# Patient Record
Sex: Male | Born: 1945
Health system: Southern US, Community
[De-identification: ages and names within clinical notes are randomized; demographics above are authoritative.]

## PROBLEM LIST (undated history)

## (undated) DIAGNOSIS — J449 Chronic obstructive pulmonary disease, unspecified: Secondary | ICD-10-CM

## (undated) DIAGNOSIS — I10 Essential (primary) hypertension: Secondary | ICD-10-CM

## (undated) DIAGNOSIS — K644 Residual hemorrhoidal skin tags: Secondary | ICD-10-CM

## (undated) DIAGNOSIS — R05 Cough: Secondary | ICD-10-CM

## (undated) DIAGNOSIS — K922 Gastrointestinal hemorrhage, unspecified: Secondary | ICD-10-CM

## (undated) DIAGNOSIS — Z8709 Personal history of other diseases of the respiratory system: Secondary | ICD-10-CM

## (undated) DIAGNOSIS — Z87898 Personal history of other specified conditions: Secondary | ICD-10-CM

## (undated) DIAGNOSIS — E119 Type 2 diabetes mellitus without complications: Secondary | ICD-10-CM

## (undated) DIAGNOSIS — Z8611 Personal history of tuberculosis: Secondary | ICD-10-CM

## (undated) DIAGNOSIS — Z87442 Personal history of urinary calculi: Secondary | ICD-10-CM

## (undated) HISTORY — DX: Personal history of other diseases of the respiratory system: Z87.09

## (undated) HISTORY — DX: Type 2 diabetes mellitus without complications: E11.9

## (undated) HISTORY — DX: Cough: R05

## (undated) HISTORY — PX: NO PAST SURGERIES: SHX2092

## (undated) HISTORY — DX: Personal history of other specified conditions: Z87.898

## (undated) HISTORY — DX: Residual hemorrhoidal skin tags: K64.4

## (undated) HISTORY — DX: Essential (primary) hypertension: I10

## (undated) HISTORY — DX: Personal history of tuberculosis: Z86.11

## (undated) HISTORY — DX: Gastrointestinal hemorrhage, unspecified: K92.2

---

## 1993-03-28 DIAGNOSIS — A159 Respiratory tuberculosis unspecified: Secondary | ICD-10-CM

## 1993-03-28 HISTORY — DX: Respiratory tuberculosis unspecified: A15.9

## 2012-08-29 ENCOUNTER — Ambulatory Visit: Payer: Self-pay | Admitting: Family Medicine

## 2012-08-29 IMAGING — CR DG CHEST 2V
1 series · 3 of 3 positions shown · non-contrast
Comparison: none

REASON FOR EXAM: cough
COMMENTS:

[Series 1: pa · 0.17mm/px · 3 of 3 slices shown]
[im 1/3]
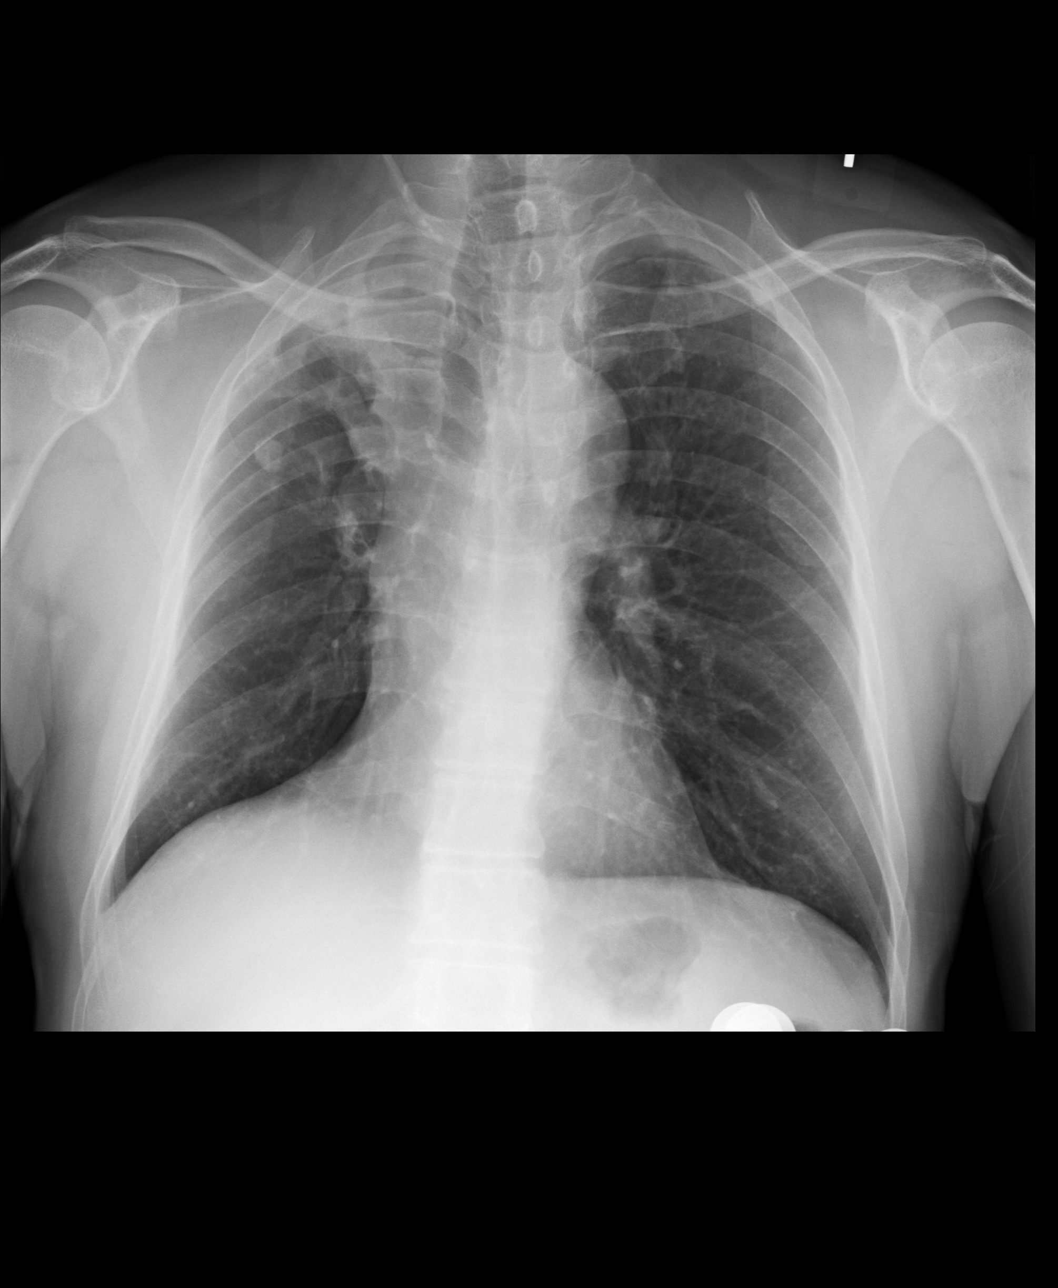
[im 2/3]
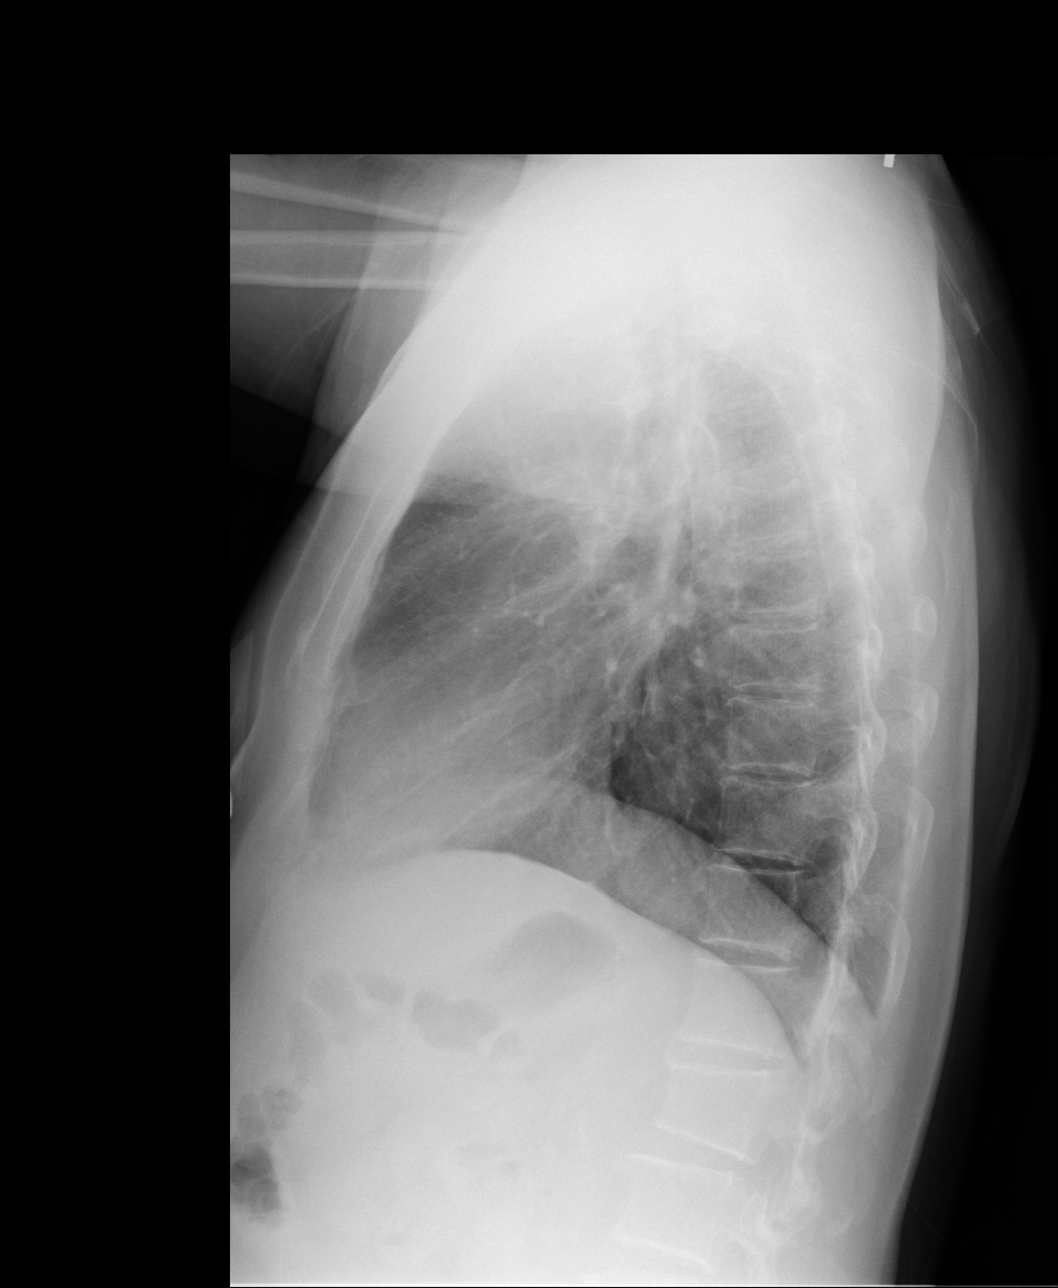
[im 3/3]
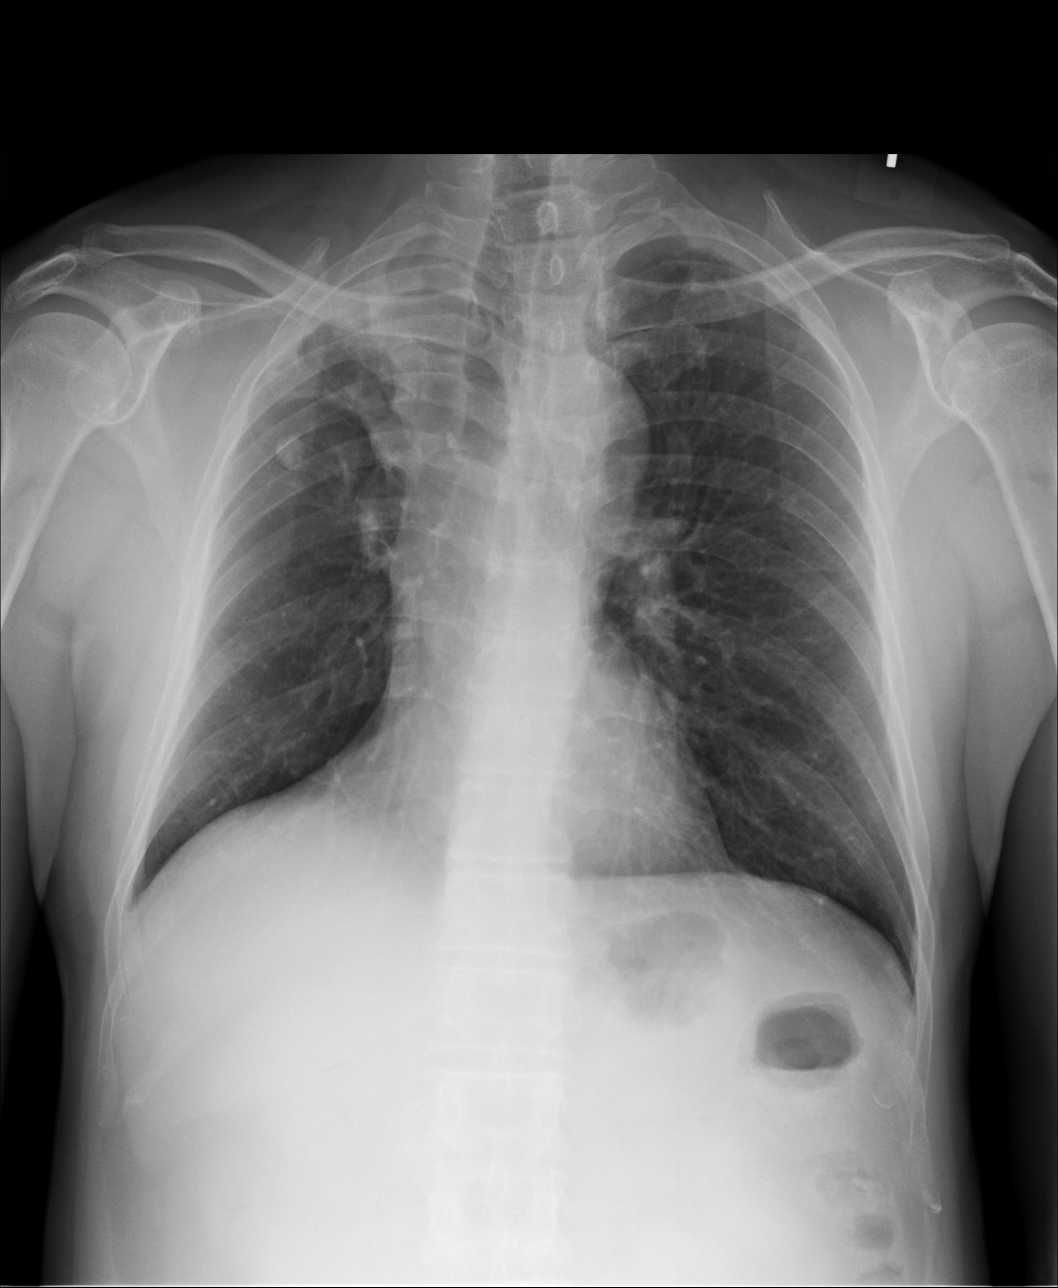

[3 of 3 positions shown; findings below may reference images not displayed]

PROCEDURE:     ROBERTO - ROBERTO CHEST PA (OR AP) AND LAT  - [DATE]  [DATE]

RESULT:     There are no previous studies for comparison.

There is abnormal soft tissue density in the right upper lobe. A nodule is
demonstrated more inferiorly likely still in the right upper lobe. The left
lung is clear. The cardiac silhouette is normal in size. There is minimal
shift of the mediastinum toward the right. There is no pleural effusion or
pneumothorax. The bony thorax exhibits no acute abnormalities.
IMPRESSION: There is abnormal soft tissue density in the right
pulmonary apex. This may be acute or chronic. If acute, malignancy or
infection would be the leading etiologies. Followup chest CT scanning and
pulmonary consultation is recommended.

[REDACTED]

## 2012-09-25 ENCOUNTER — Ambulatory Visit: Payer: Self-pay | Admitting: Family Medicine

## 2012-09-25 IMAGING — CR DG CHEST 2V
1 series · 2 of 2 positions shown · non-contrast
Comparison: none

REASON FOR EXAM: +ppd cough left upper chest & shoulder pain Please
Compare to cxr [DATE]
COMMENTS:

[Series 1: w chest pa · 0.14mm/px · 2 of 2 slices shown]
[im 1/2]
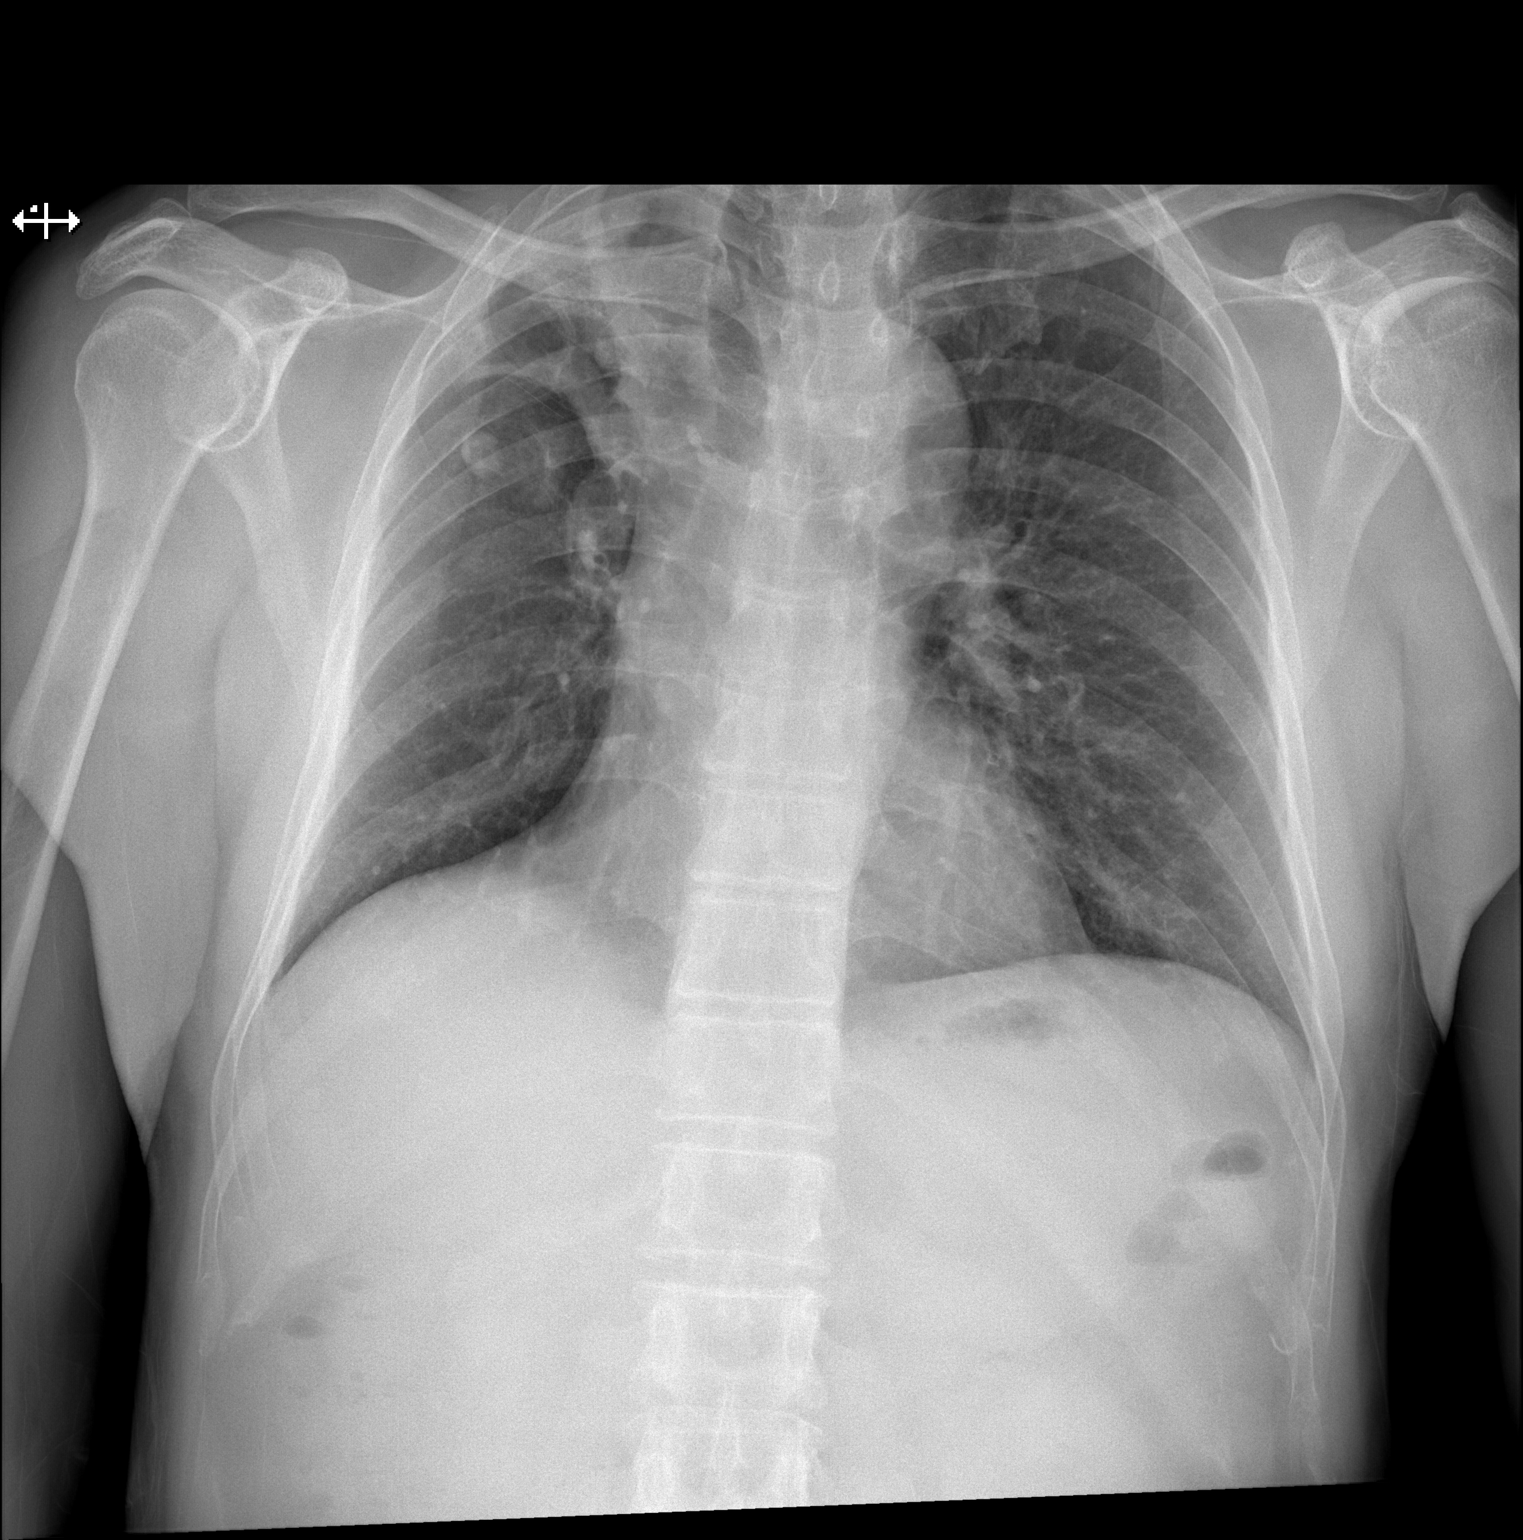
[im 2/2]
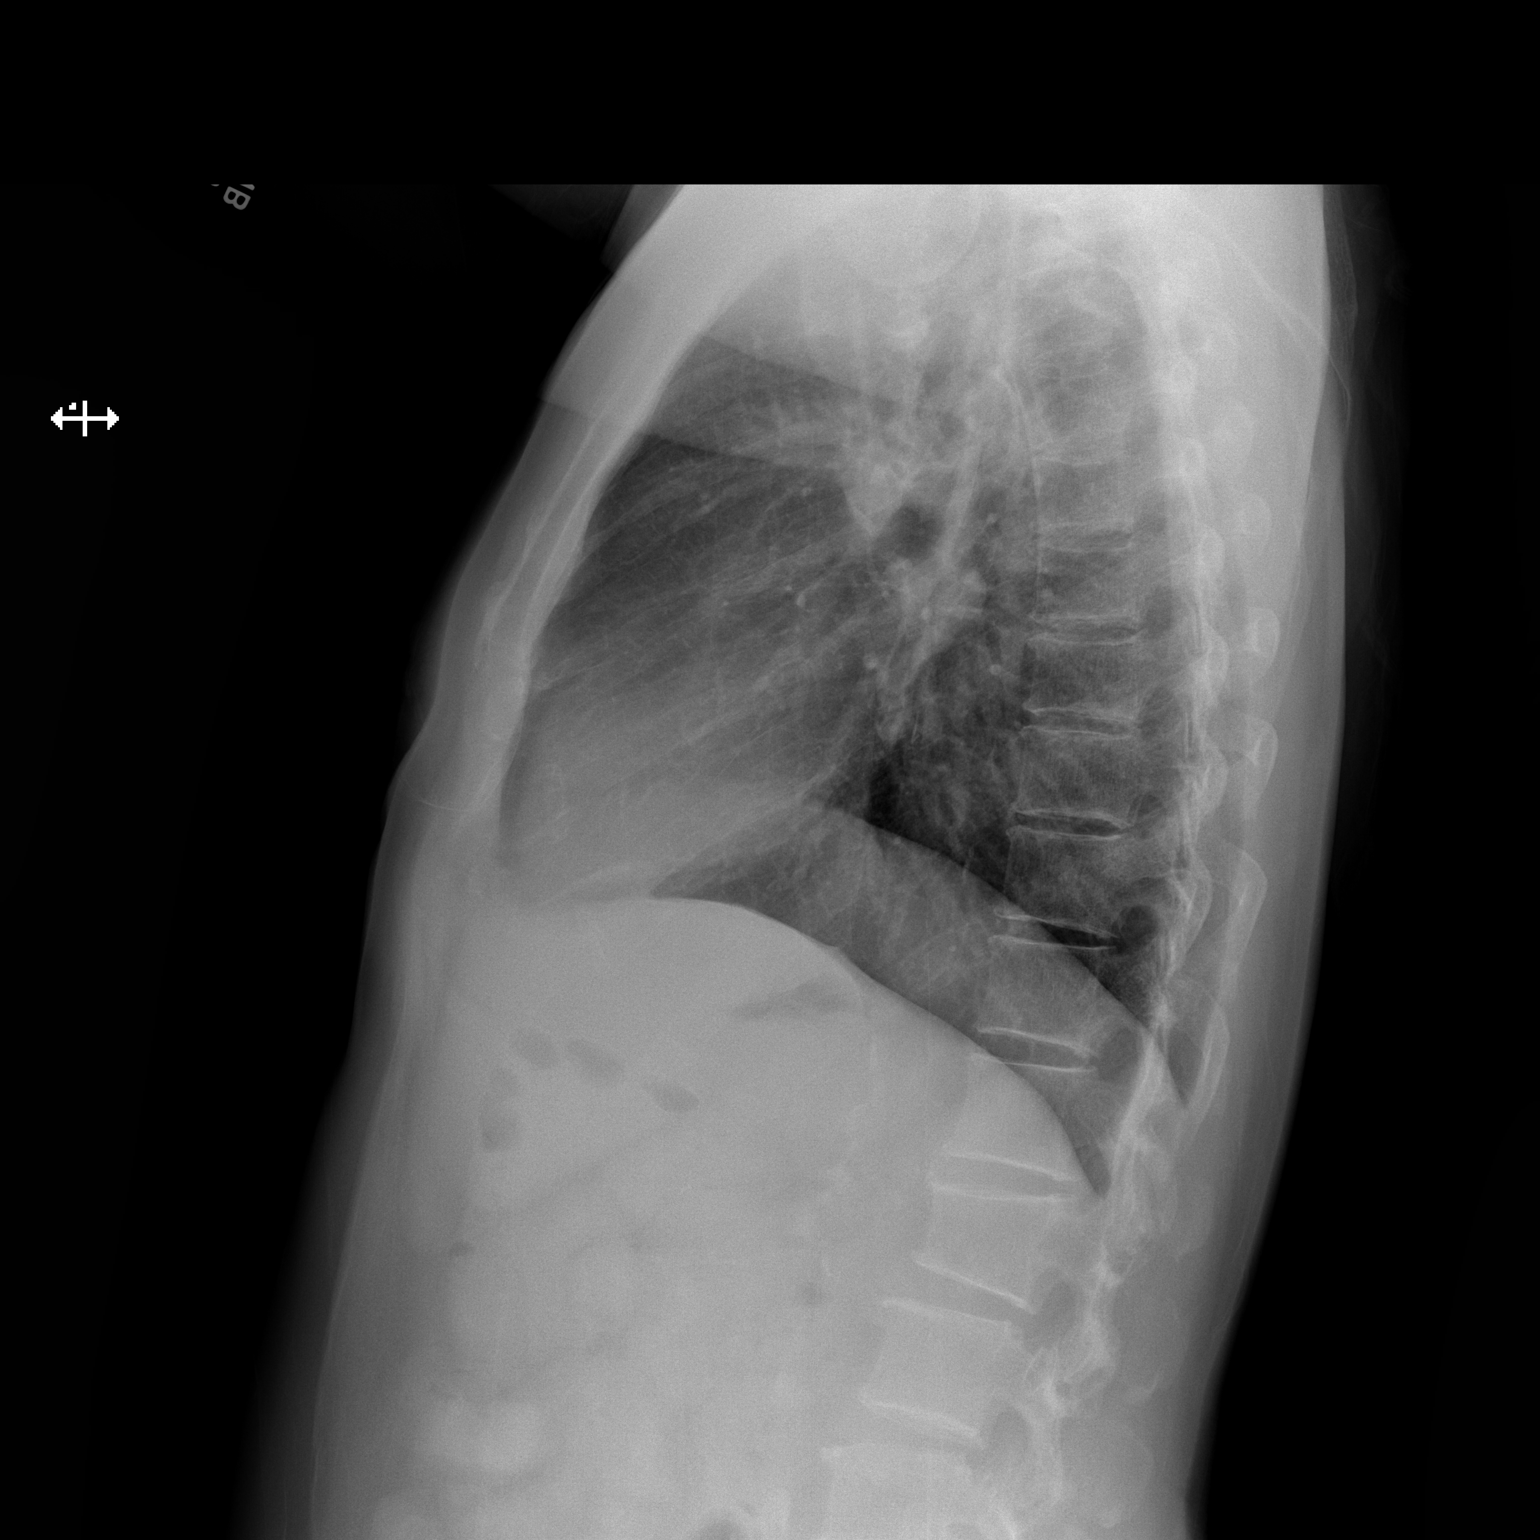

[2 of 2 positions shown; findings below may reference images not displayed]

PROCEDURE:     DXR - DXR CHEST PA (OR AP) AND LATERAL  - [DATE] [DATE]

RESULT:     Comparison is made to the study [DATE].

There is stable apical pleural thickening. On the right there is persistent
abnormal soft tissue density in the upper lobe. A discrete nodule measuring
approximately 10 x 12 mm is visible and stable. More superiorly density
radiates to the pleural surface and apex. There is no significant pleural
fluid collection.

The left lung is well-expanded and exhibits a slight increase in the
interstitial markings especially in the infrahilar region.

The cardiac silhouette is normal in size. The pulmonary vascularity is not
engorged.
IMPRESSION: 1. There is persistent abnormal density in the right upper hemithorax. This
has not significantly changed since the previous study.
2. The interstitial markings on the left are slightly more prominent today
especially in the infrahilar region lateral to the cardiac apex. This may
reflect subsegmental atelectasis.
3. There is no evidence of CHF nor alveolar infiltrate outside of the right
upper lobe.

[REDACTED]

## 2014-02-01 ENCOUNTER — Inpatient Hospital Stay: Payer: Self-pay | Admitting: Internal Medicine

## 2014-02-01 LAB — CBC WITH DIFFERENTIAL/PLATELET
Basophil #: 0 10*3/uL (ref 0.0–0.1)
Basophil %: 0.4 %
Eosinophil #: 0 10*3/uL (ref 0.0–0.7)
Eosinophil %: 0.1 %
HCT: 44.4 % (ref 40.0–52.0)
HGB: 15 g/dL (ref 13.0–18.0)
Lymphocyte #: 2.2 10*3/uL (ref 1.0–3.6)
Lymphocyte %: 19.9 %
MCH: 31.7 pg (ref 26.0–34.0)
MCHC: 33.8 g/dL (ref 32.0–36.0)
MCV: 94 fL (ref 80–100)
Monocyte #: 1.2 x10 3/mm — ABNORMAL HIGH (ref 0.2–1.0)
Monocyte %: 10.6 %
NEUTROS PCT: 69 %
Neutrophil #: 7.6 10*3/uL — ABNORMAL HIGH (ref 1.4–6.5)
PLATELETS: 247 10*3/uL (ref 150–440)
RBC: 4.73 10*6/uL (ref 4.40–5.90)
RDW: 12.9 % (ref 11.5–14.5)
WBC: 11 10*3/uL — ABNORMAL HIGH (ref 3.8–10.6)

## 2014-02-01 LAB — URINALYSIS, COMPLETE
BILIRUBIN, UR: NEGATIVE
Bacteria: NONE SEEN
Glucose,UR: 50 mg/dL (ref 0–75)
LEUKOCYTE ESTERASE: NEGATIVE
Nitrite: NEGATIVE
PH: 5 (ref 4.5–8.0)
RBC,UR: 1 /HPF (ref 0–5)
Specific Gravity: 1.023 (ref 1.003–1.030)
Squamous Epithelial: 1
WBC UR: 1 /HPF (ref 0–5)

## 2014-02-01 LAB — TROPONIN I

## 2014-02-01 LAB — BASIC METABOLIC PANEL
Anion Gap: 11 (ref 7–16)
BUN: 14 mg/dL (ref 7–18)
Calcium, Total: 9.4 mg/dL (ref 8.5–10.1)
Chloride: 100 mmol/L (ref 98–107)
Co2: 25 mmol/L (ref 21–32)
Creatinine: 0.92 mg/dL (ref 0.60–1.30)
EGFR (African American): >60
Glucose: 151 mg/dL — ABNORMAL HIGH (ref 65–99)
Osmolality: 275 (ref 275–301)
Potassium: 3.1 mmol/L — ABNORMAL LOW (ref 3.5–5.1)
Sodium: 136 mmol/L (ref 136–145)

## 2014-02-01 IMAGING — CR DG CHEST 1V PORT
1 series · 2 of 2 positions shown · non-contrast
Comparison: Radiograph [DATE]

CLINICAL DATA: Patient feels sick for 2 weeks. Nonproductive cough.

EXAM:
PORTABLE CHEST - 1 VIEW

[Series 1: ap · 0.17mm/px · 2 of 2 slices shown]
[im 1/2]
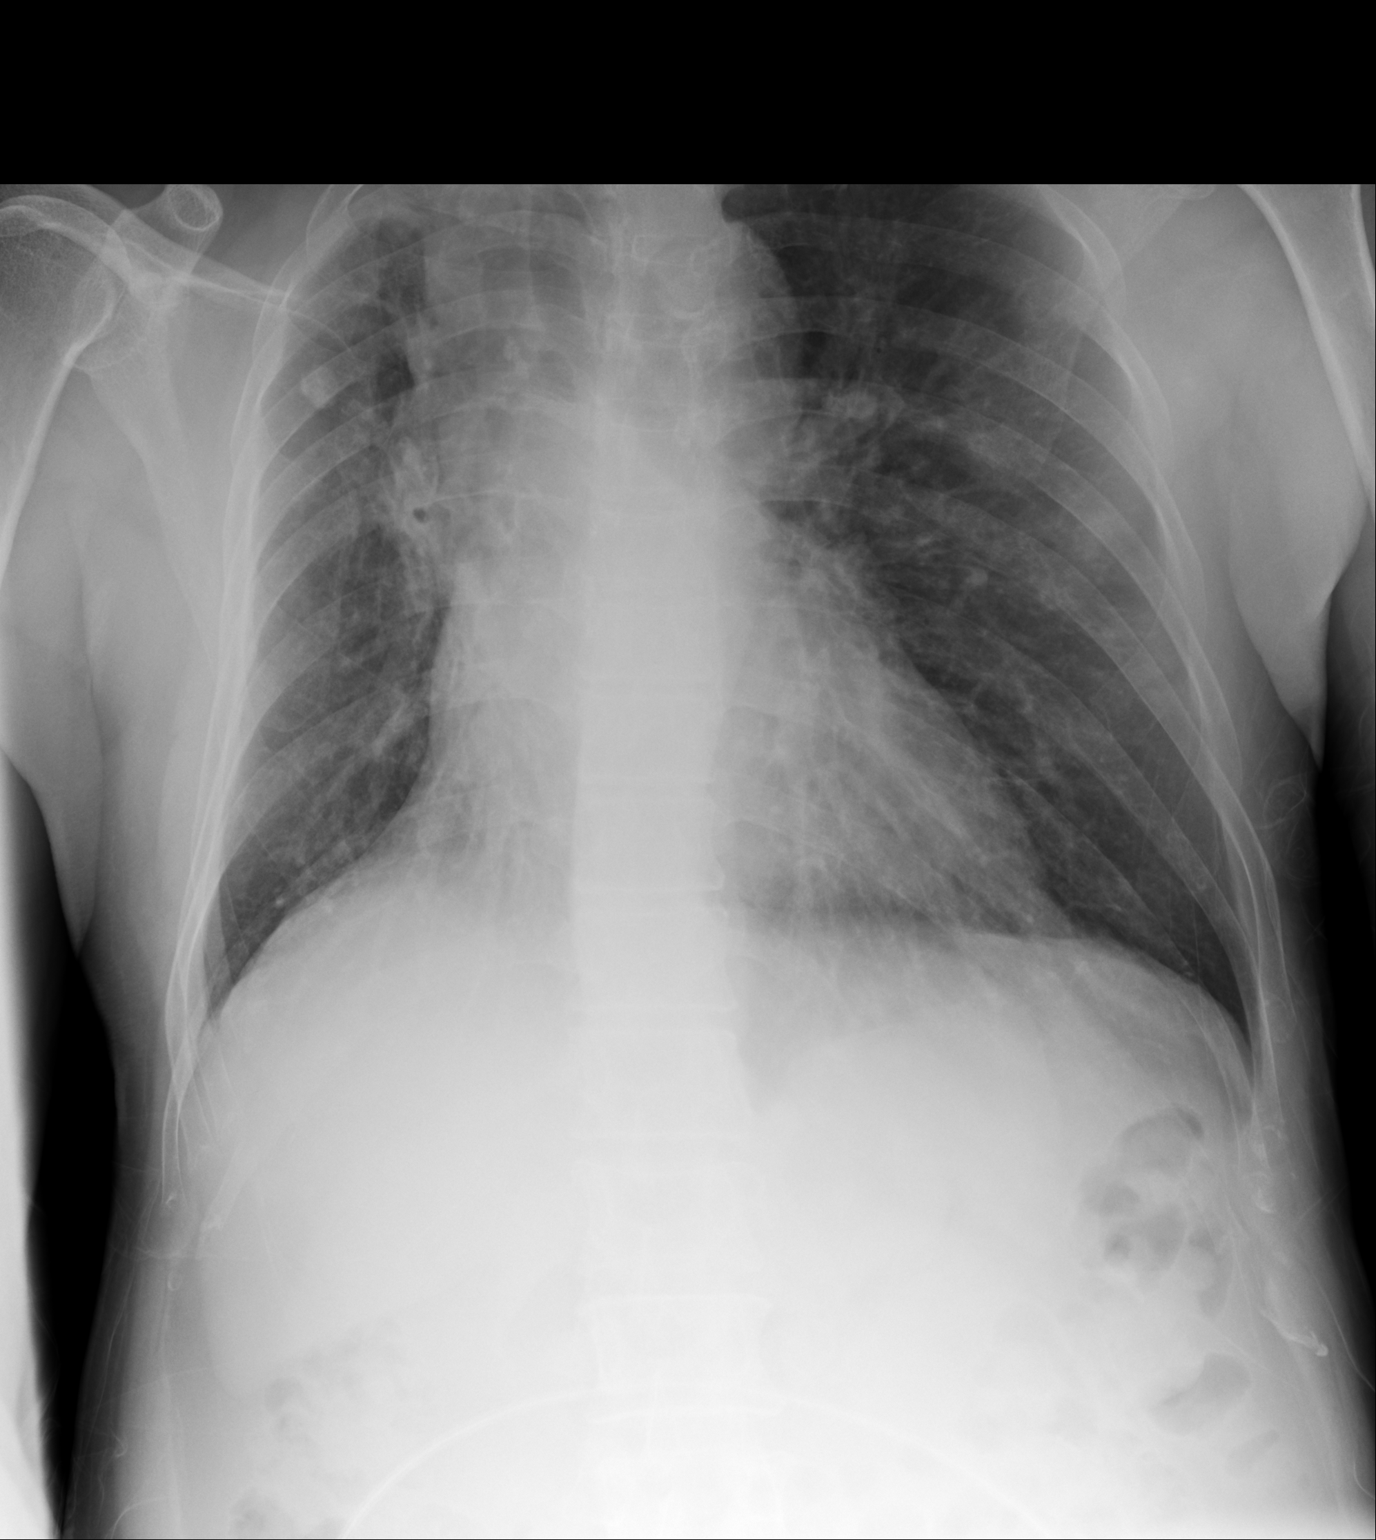
[im 2/2]
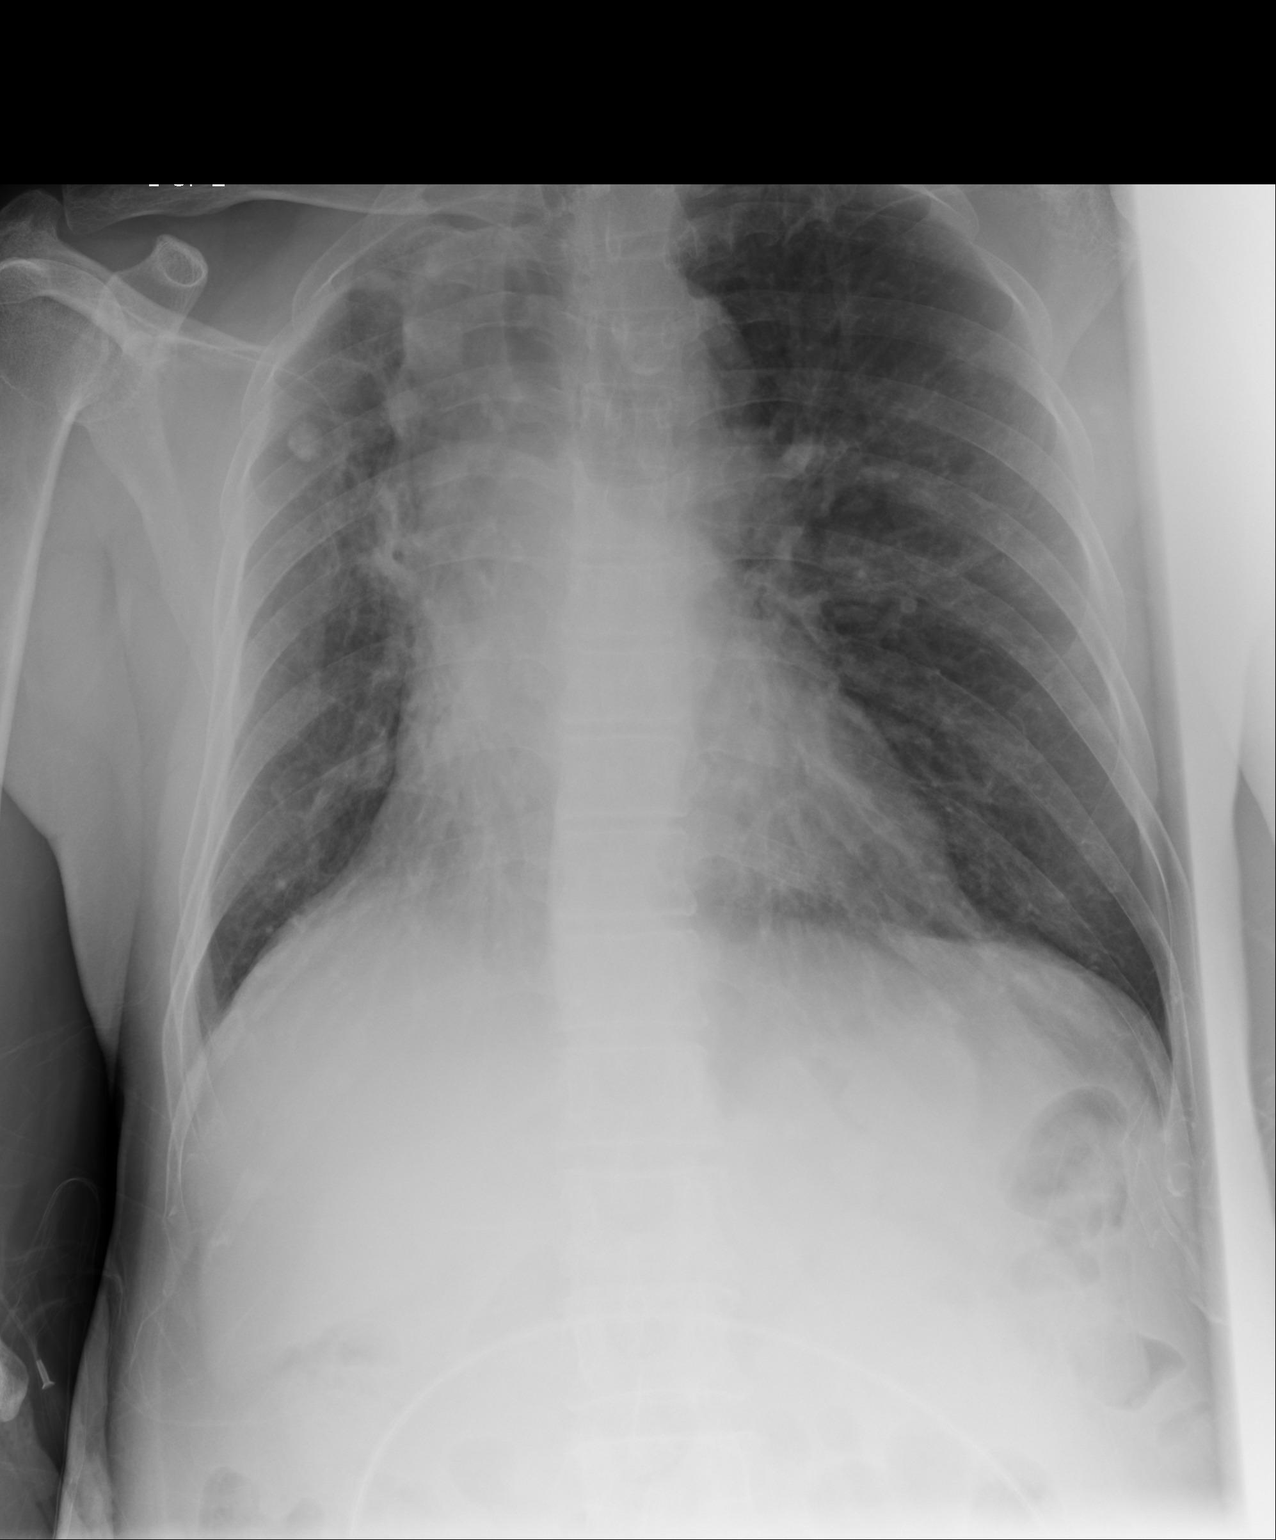

[2 of 2 positions shown; findings below may reference images not displayed]

FINDINGS: Stable cardiac silhouette. There is volume loss in the right hemi
thorax compared to prior. There is right apical opacity increased in
density compared to prior. Stable nodule in the right upper lobe
measures 9 mm. 10 mm nodule in the left lung is not seen on prior
exam.
IMPRESSION: 1. Increased volume loss in right hemi thorax with apical density.
2. New nodule in the left lung. Recommend CT thorax with contrast
for further evaluation.

## 2014-02-01 IMAGING — CT CT CHEST W/ CM
2 of 3 series · 15 of 36 positions shown, 18 images · IV contrast (agent unspecified)
Comparison: Chest [DATE]

CLINICAL DATA: Patient fell 6 for 2 weeks with nonproductive cough,
dizziness, hot and cold flashes, chest pain, and abdominal pain.
Smoker.

EXAM:
CT CHEST WITH CONTRAST
TECHNIQUE: Multidetector CT imaging of the chest was performed during
intravenous contrast administration.
CONTRAST:  75 mL [[place]]

[Series 2: routine chest with · axial · 0.57mm/px · z∈[-360,-90]mm · 12 of 64 slices shown, 15 images]
[im 5/64  mediastinal]
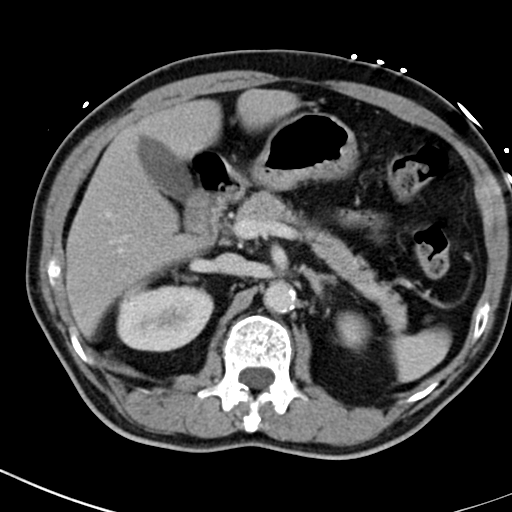
[im 5/64  lung]
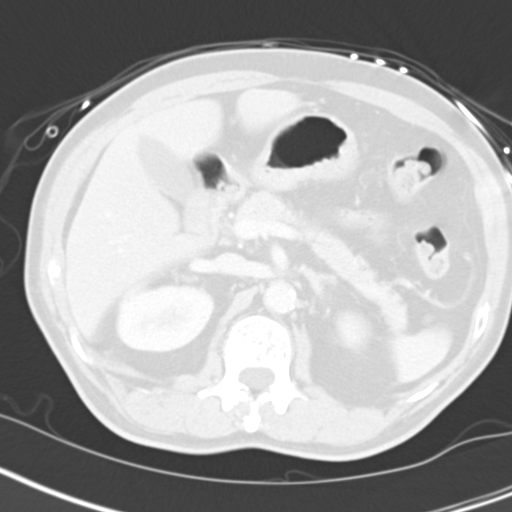
[im 10/64  lung]
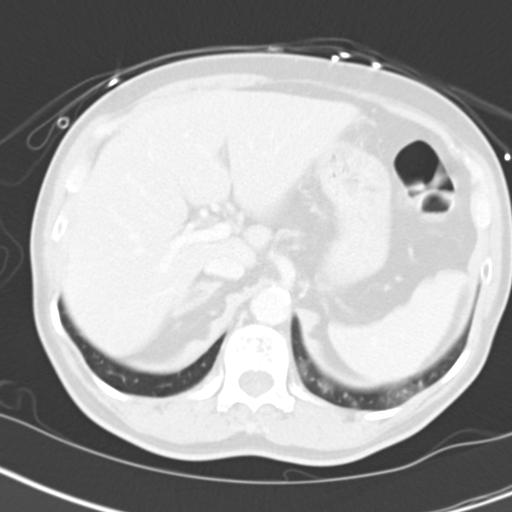
[im 15/64  lung]
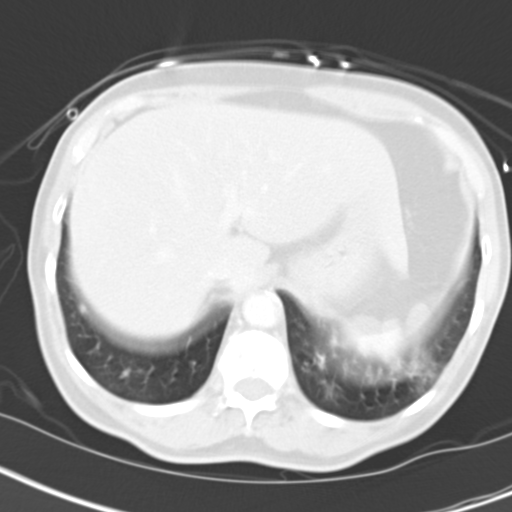
[im 19/64  lung]
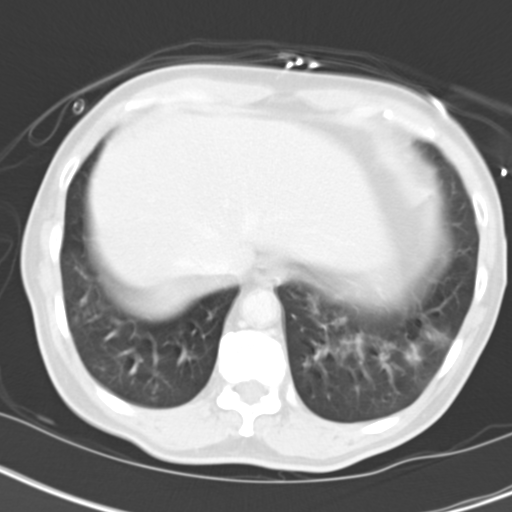
[im 24/64  mediastinal]
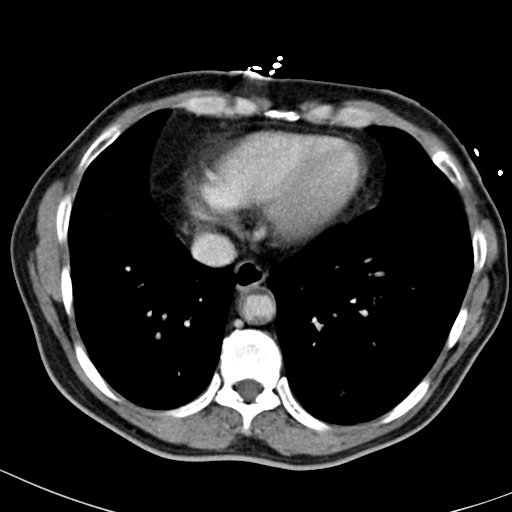
[im 24/64  lung]
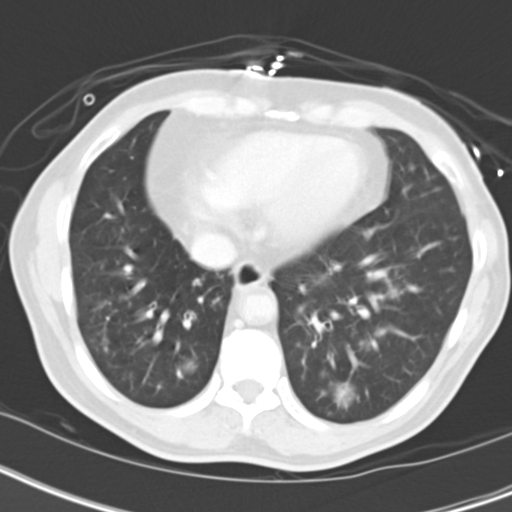
[im 29/64  lung]
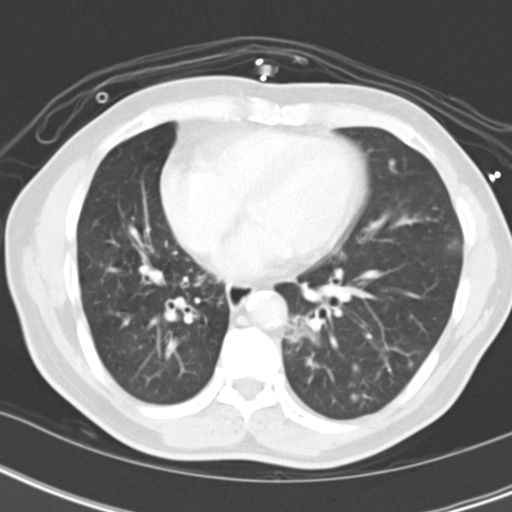
[im 36/64  lung]
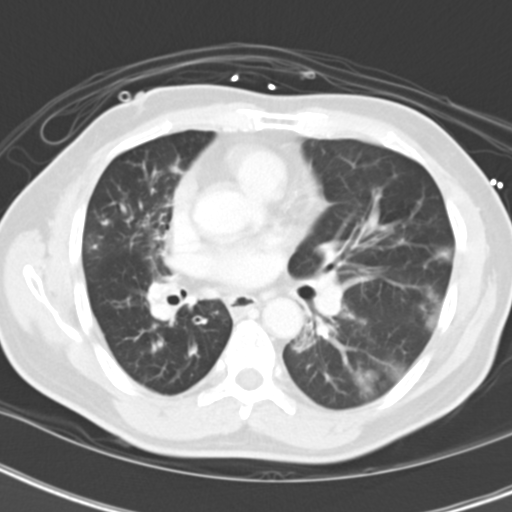
[im 40/64  lung]
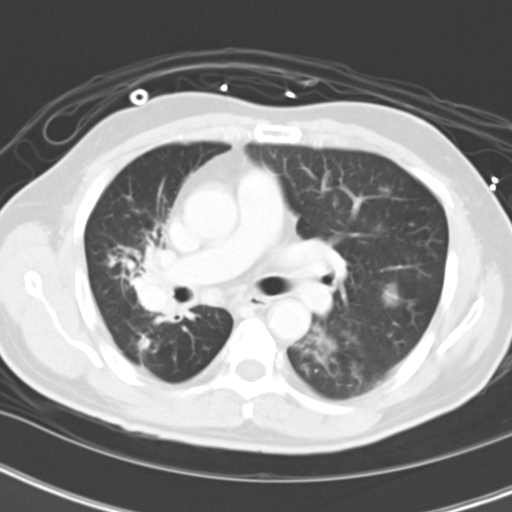
[im 45/64  mediastinal]
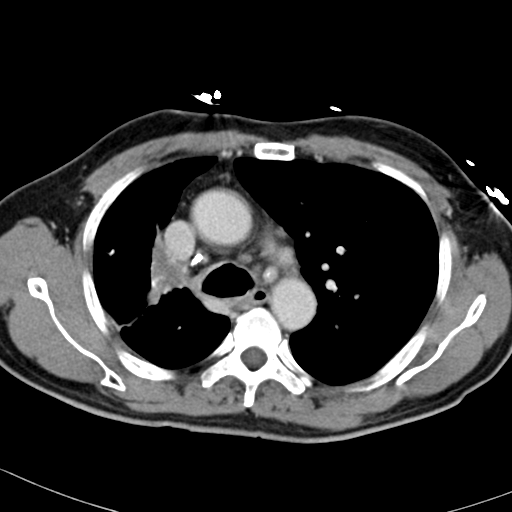
[im 45/64  lung]
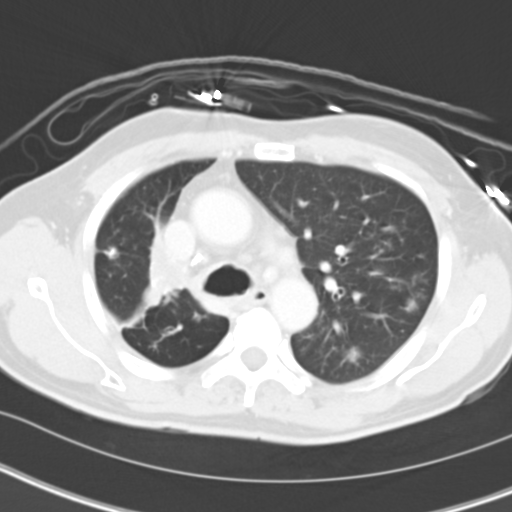
[im 50/64  lung]
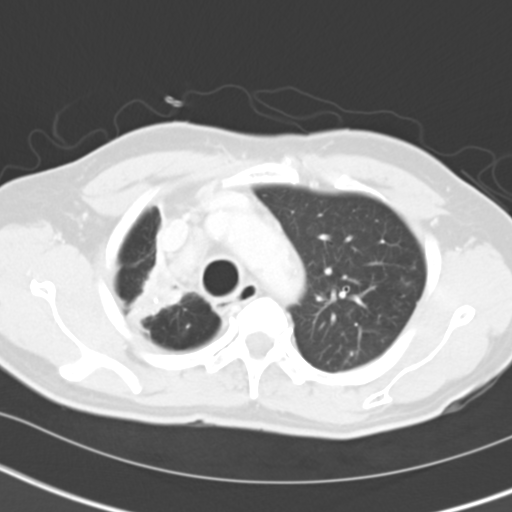
[im 54/64  lung]
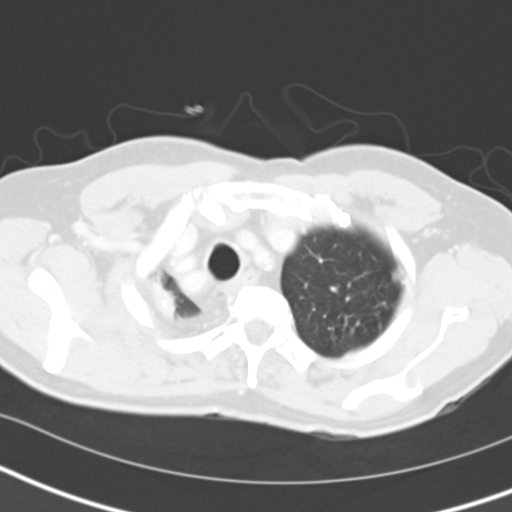
[im 59/64  lung]
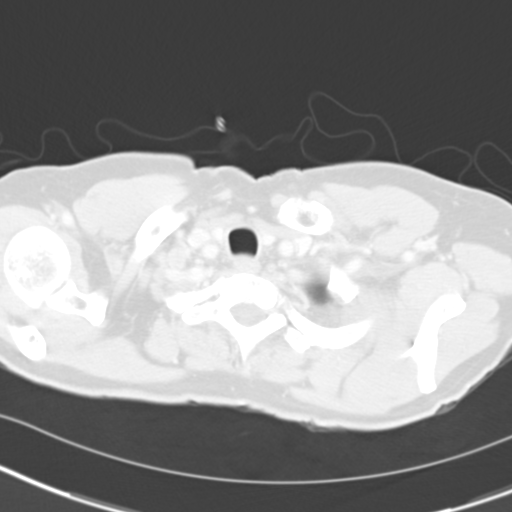

[Series 5: cor routine chest with · coronal · 0.57mm/px · 3 of 111 slices shown]
[im 23/111  lung]
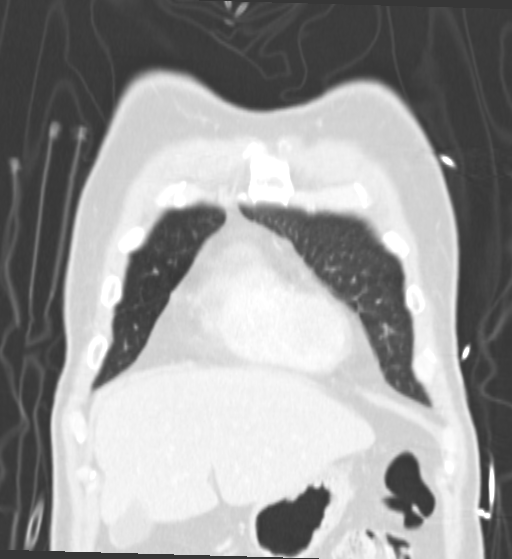
[im 45/111  lung]
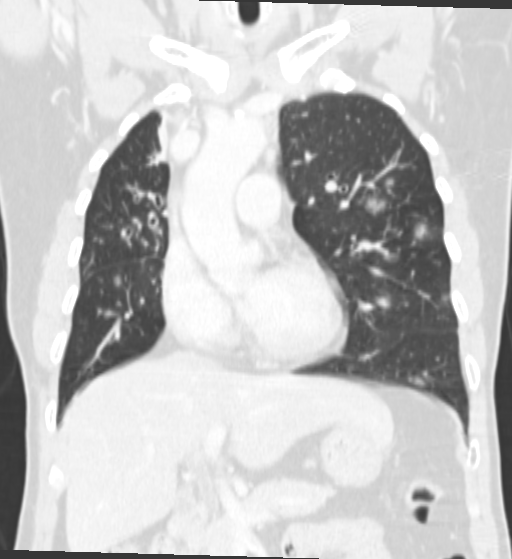
[im 67/111  lung]
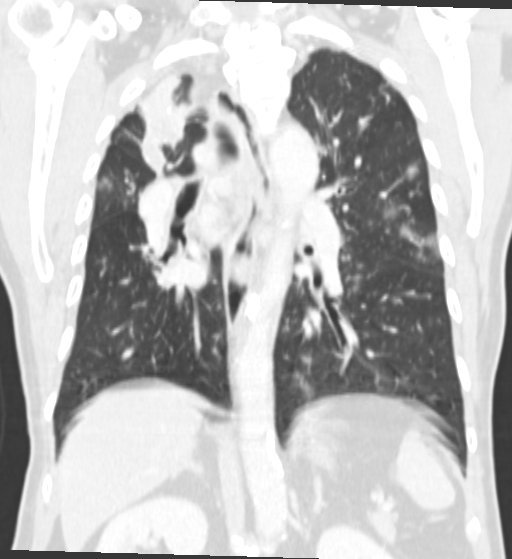

[15 of 36 positions shown; findings below may reference images not displayed]

FINDINGS: Normal heart size. Normal caliber thoracic aorta with calcification.
Scattered lymph nodes are demonstrated throughout the mediastinum
and in the right hilum which are upper limits of normal. These are
likely reactive or inflammatory. Calcification present in some lymph
nodes with calcified granuloma in the right mid lung. Calcified
granuloma also in the left apex.

Consolidation and calcification in the right upper lung with
associated volume loss and mediastinal shift towards the right.
Patchy nodular infiltrates demonstrated throughout both lungs.
Appearance suggest inflammatory process and is worrisome for
reactivation TB. Multifocal pneumonia or septic embolus could also
have this appearance. Metastatic disease or lymphoma felt less
likely. No pleural effusions. No pneumothorax.

Normal caliber thoracic aorta with calcification. Great vessel
origins are patent. Included portions of the upper abdominal organs
demonstrate diffuse fatty infiltration of the liver and vascular
calcifications in the aorta. Degenerative changes in the spine. No
destructive bone lesions appreciated.
IMPRESSION: Calcified granulomas in the lungs with calcified right hilar nodes.
Calcification and scarring in the right apex with associated volume
loss. Patchy nodular airspace disease demonstrated throughout both
lungs. Appearance is consistent with inflammatory process and
reactivation TB should be excluded.

These results were called by telephone at the time of interpretation
on [DATE] at [DATE] to Dr. ARAKEZA , who verbally
acknowledged these results.

## 2014-02-02 LAB — BILIRUBIN, TOTAL: Bilirubin,Total: 0.5 mg/dL (ref 0.2–1.0)

## 2014-02-02 LAB — BILIRUBIN, DIRECT: Bilirubin, Direct: <0.1 mg/dL (ref 0.0–0.2)

## 2014-02-02 LAB — RAPID HIV SCREEN (HIV 1/2 AB+AG)

## 2014-02-02 IMAGING — US US RENAL KIDNEY
1 series · 14 of 25 positions shown · non-contrast
Comparison: None.

CLINICAL DATA: Flank pain. History of tuberculosis. Urinary
frequency.

EXAM:
RENAL/URINARY TRACT ULTRASOUND COMPLETE

[Series 1: us renal kidney · 0.23mm/px · 14 of 25 slices shown]
[im 1/25]
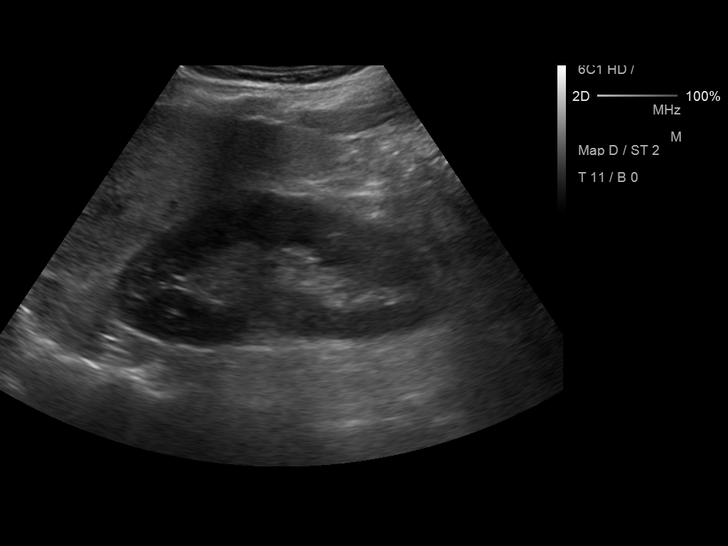
[im 3/25]
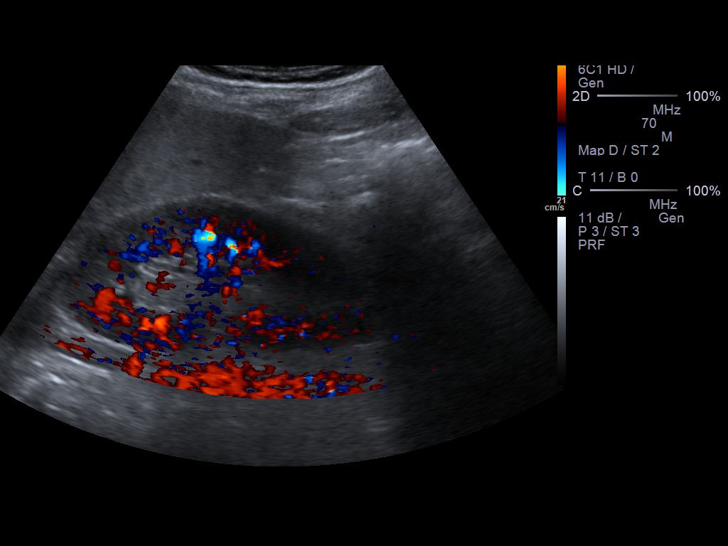
[im 5/25]
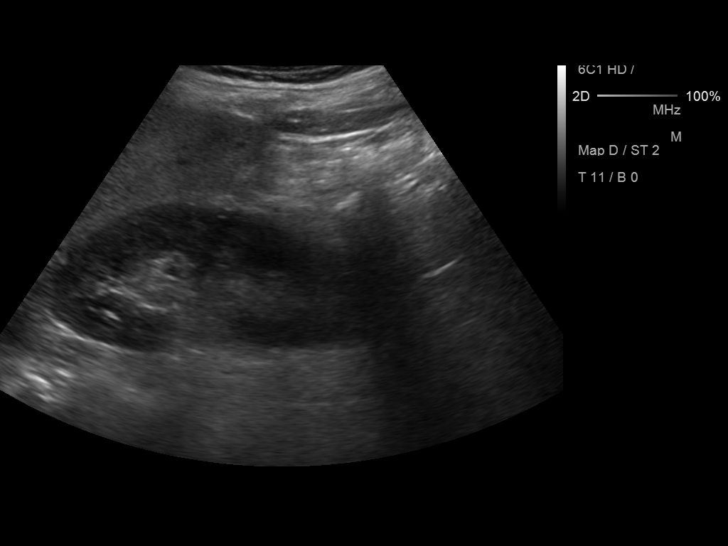
[im 7/25]
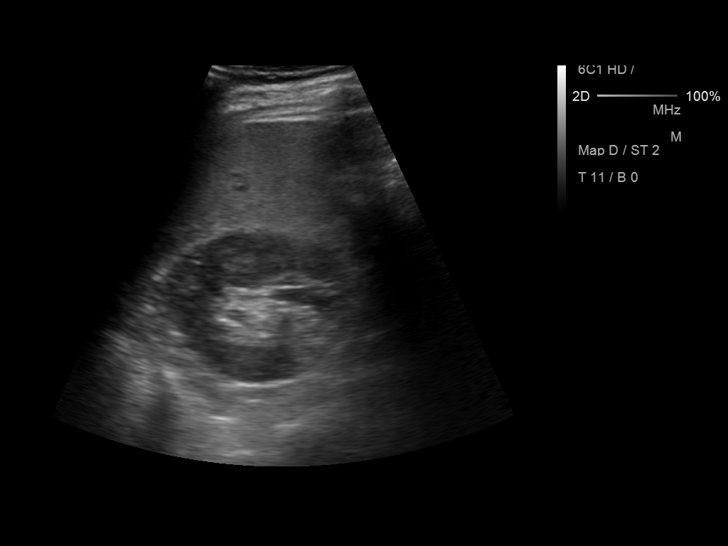
[im 9/25]
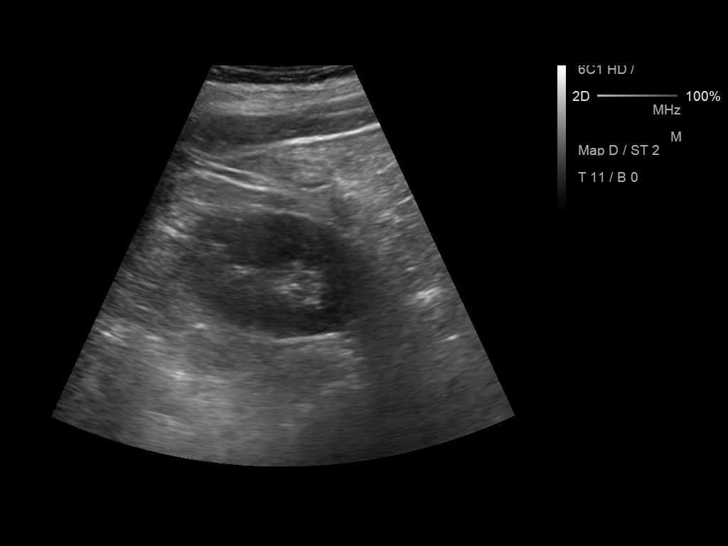
[im 10/25]
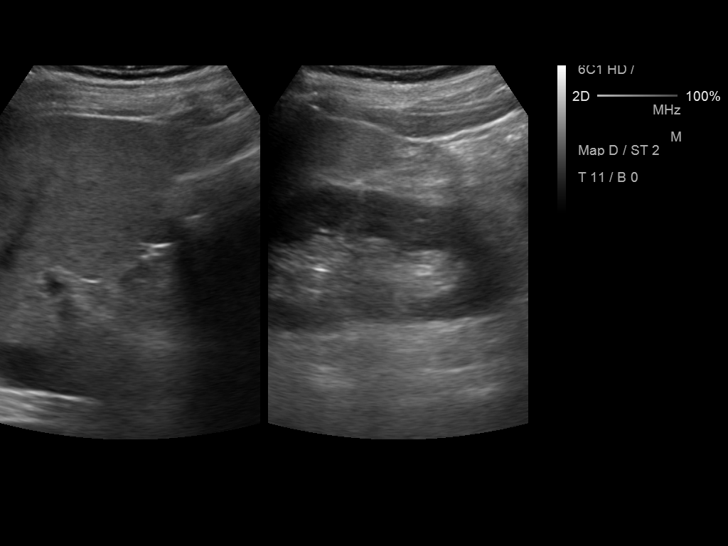
[im 12/25]
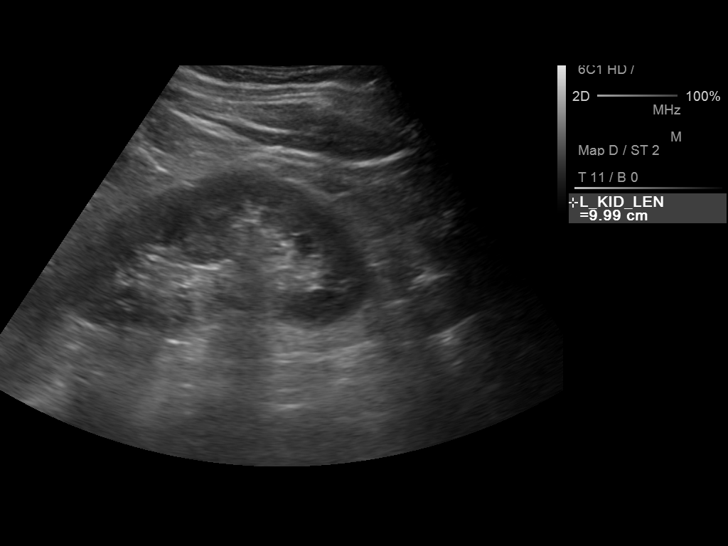
[im 14/25]
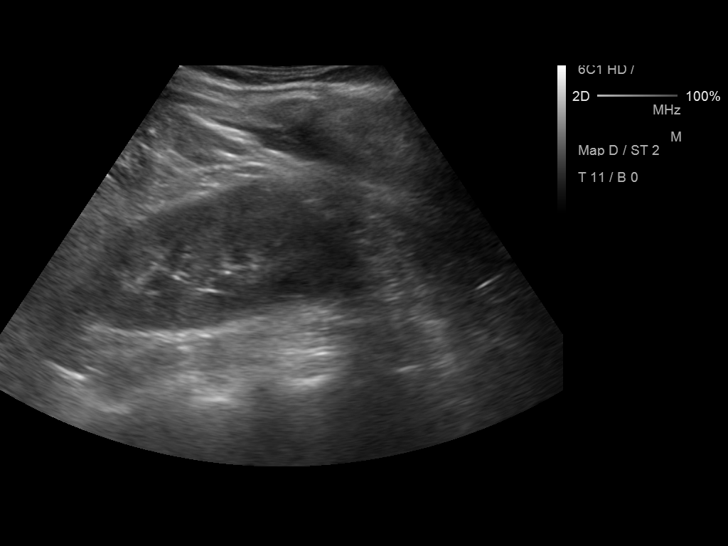
[im 16/25]
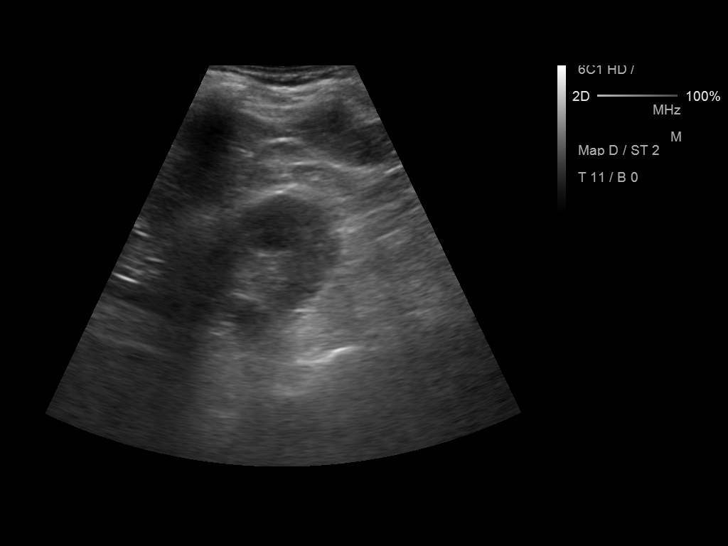
[im 17/25]
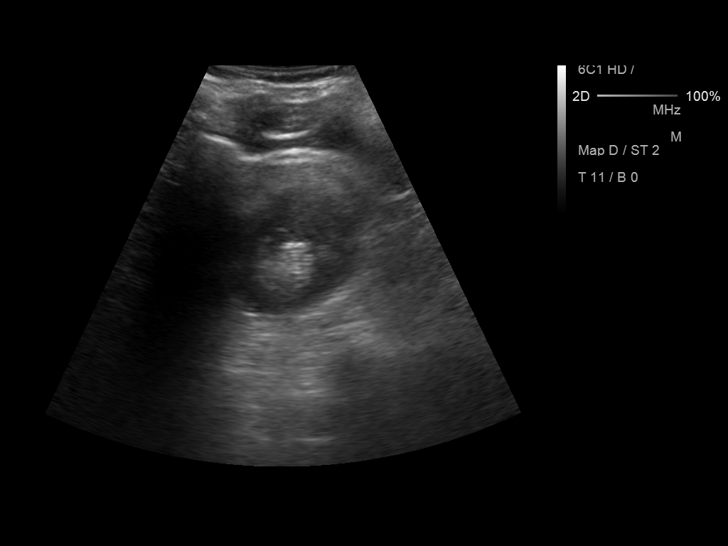
[im 19/25]
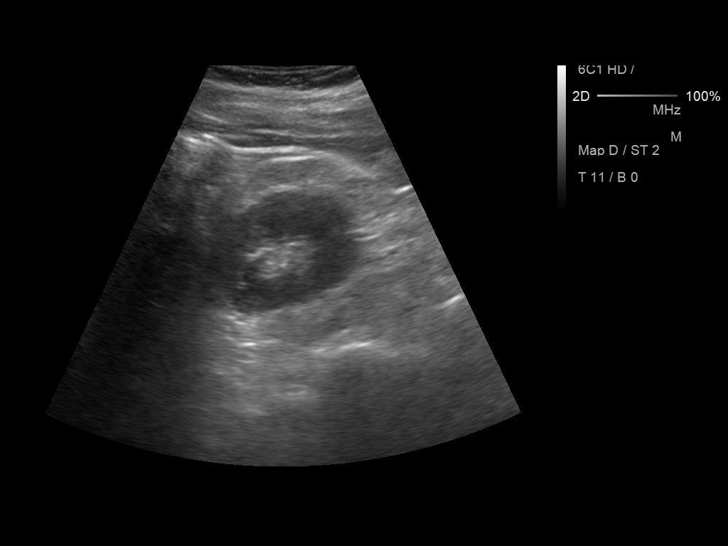
[im 21/25]
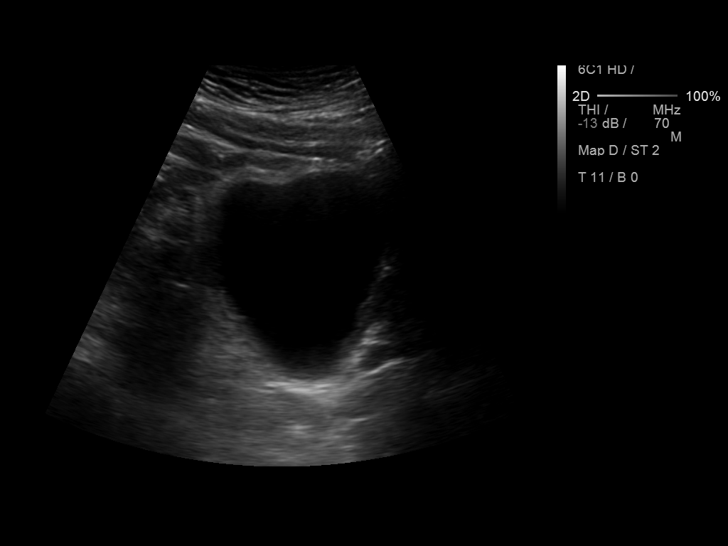
[im 23/25]
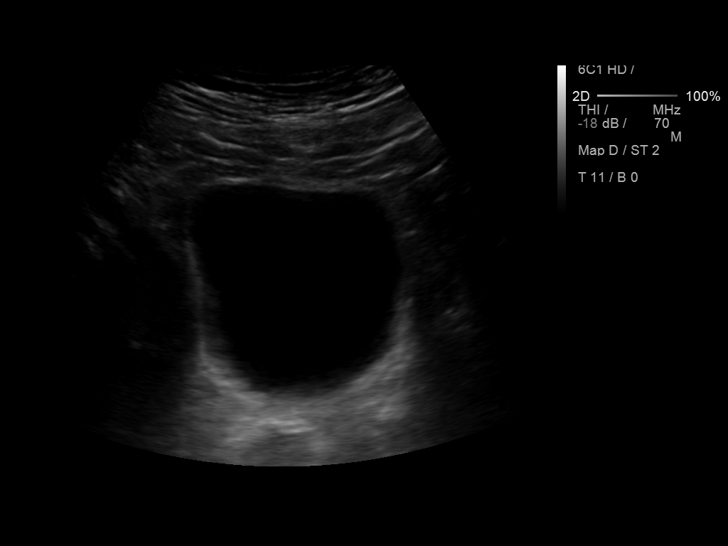
[im 25/25]
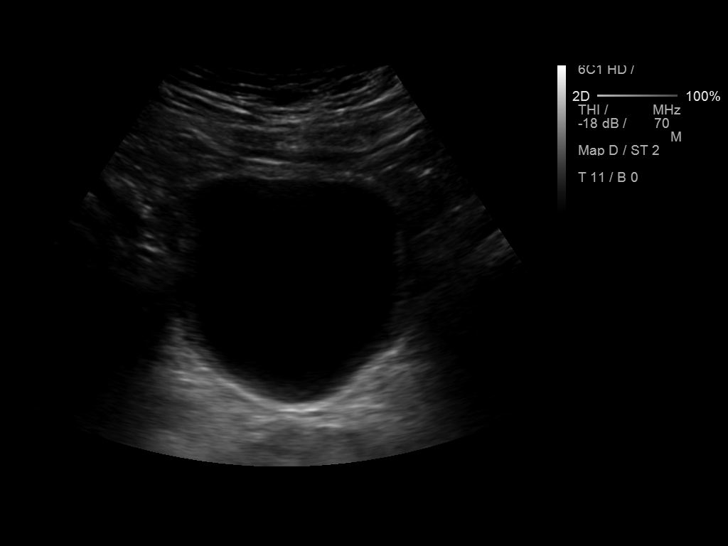

[14 of 25 positions shown; findings below may reference images not displayed]

FINDINGS: Right Kidney:

Length: 10.8 cm. Echogenicity within normal limits. No mass or
hydronephrosis visualized.

Left Kidney:

Length: 10.0 cm. Echogenicity within normal limits. No mass or
hydronephrosis visualized.

Bladder:

Appears normal for degree of bladder distention.
IMPRESSION: Normal renal ultrasound.

## 2014-02-03 LAB — PRO B NATRIURETIC PEPTIDE: B-TYPE NATIURETIC PEPTID: 1148 pg/mL — AB (ref 0–125)

## 2014-02-04 DIAGNOSIS — I509 Heart failure, unspecified: Secondary | ICD-10-CM

## 2014-02-04 LAB — CBC WITH DIFFERENTIAL/PLATELET
Basophil #: 0 10*3/uL (ref 0.0–0.1)
Basophil %: 0.4 %
EOS ABS: 0 10*3/uL (ref 0.0–0.7)
Eosinophil %: 0.1 %
HCT: 38.9 % — ABNORMAL LOW (ref 40.0–52.0)
HGB: 12.9 g/dL — AB (ref 13.0–18.0)
LYMPHS PCT: 19.9 %
Lymphocyte #: 1.7 10*3/uL (ref 1.0–3.6)
MCH: 31.5 pg (ref 26.0–34.0)
MCHC: 33.3 g/dL (ref 32.0–36.0)
MCV: 95 fL (ref 80–100)
Monocyte #: 0.4 x10 3/mm (ref 0.2–1.0)
Monocyte %: 4.8 %
NEUTROS PCT: 74.8 %
Neutrophil #: 6.4 10*3/uL (ref 1.4–6.5)
Platelet: 298 10*3/uL (ref 150–440)
RBC: 4.11 10*6/uL — ABNORMAL LOW (ref 4.40–5.90)
RDW: 12.8 % (ref 11.5–14.5)
WBC: 8.6 10*3/uL (ref 3.8–10.6)

## 2014-02-04 LAB — BASIC METABOLIC PANEL
ANION GAP: 9 (ref 7–16)
BUN: 18 mg/dL (ref 7–18)
CALCIUM: 9.1 mg/dL (ref 8.5–10.1)
Chloride: 102 mmol/L (ref 98–107)
Co2: 25 mmol/L (ref 21–32)
Creatinine: 0.87 mg/dL (ref 0.60–1.30)
Glucose: 225 mg/dL — ABNORMAL HIGH (ref 65–99)
OSMOLALITY: 281 (ref 275–301)
Potassium: 4.1 mmol/L (ref 3.5–5.1)
Sodium: 136 mmol/L (ref 136–145)

## 2014-02-04 LAB — PSA: PSA: 0.8 ng/mL (ref 0.0–4.0)

## 2014-02-04 IMAGING — CR DG CHEST 2V
1 series · 3 of 3 positions shown · non-contrast
Comparison: Chest x-ray and CT of the chest on [DATE]

CLINICAL DATA: Difficulty breathing and shortness of breath. Prior
granulomatous disease.

EXAM:
CHEST - 2 VIEW

[Series 1: x chest ap · 0.14mm/px · 3 of 3 slices shown]
[im 1/3]
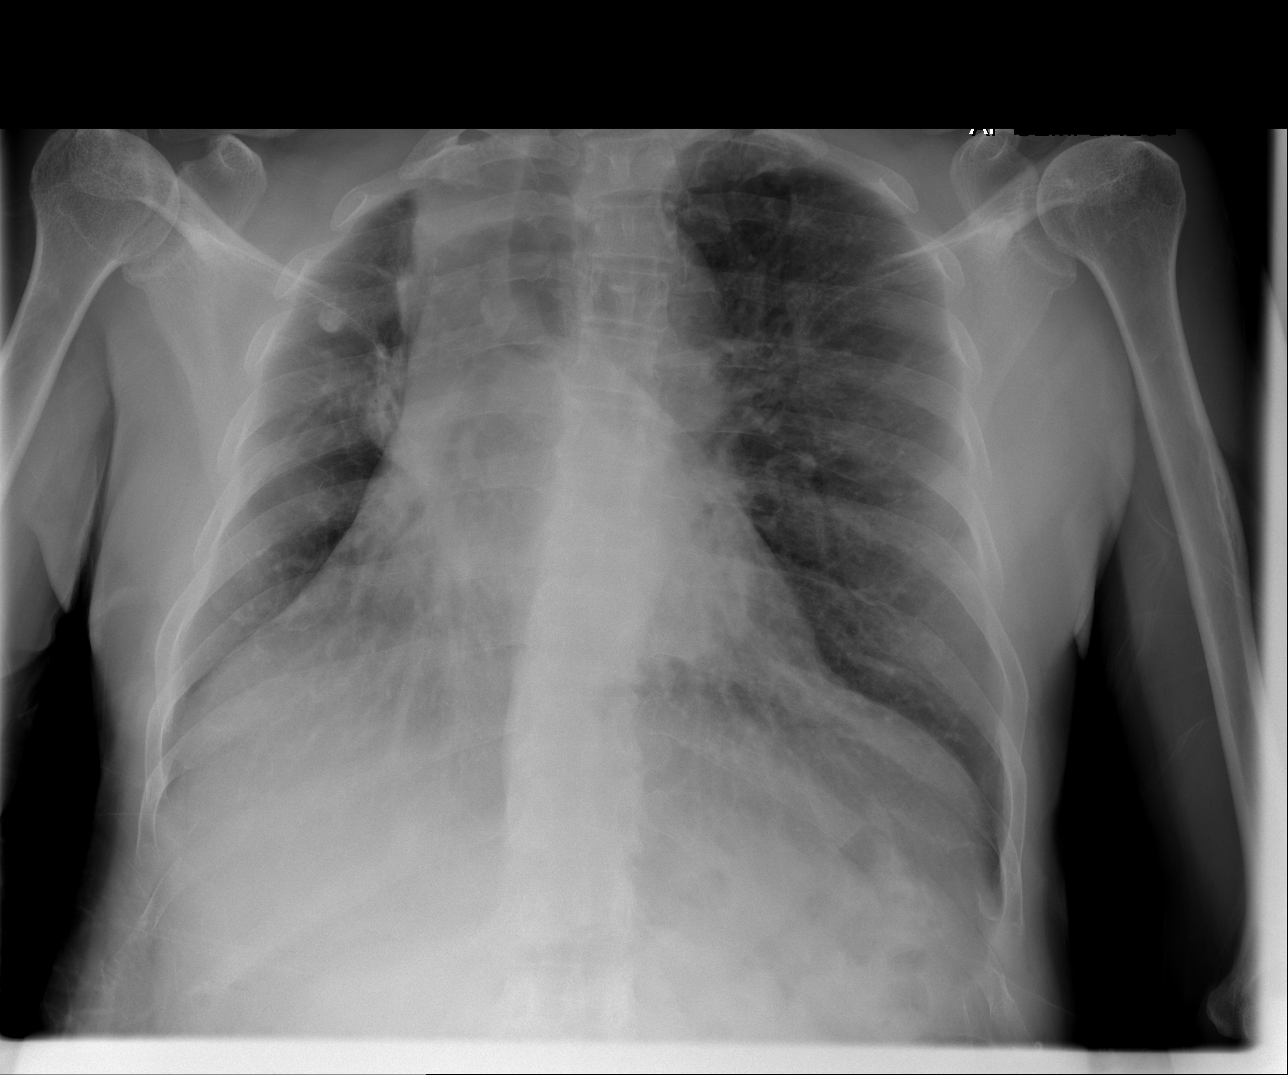
[im 2/3]
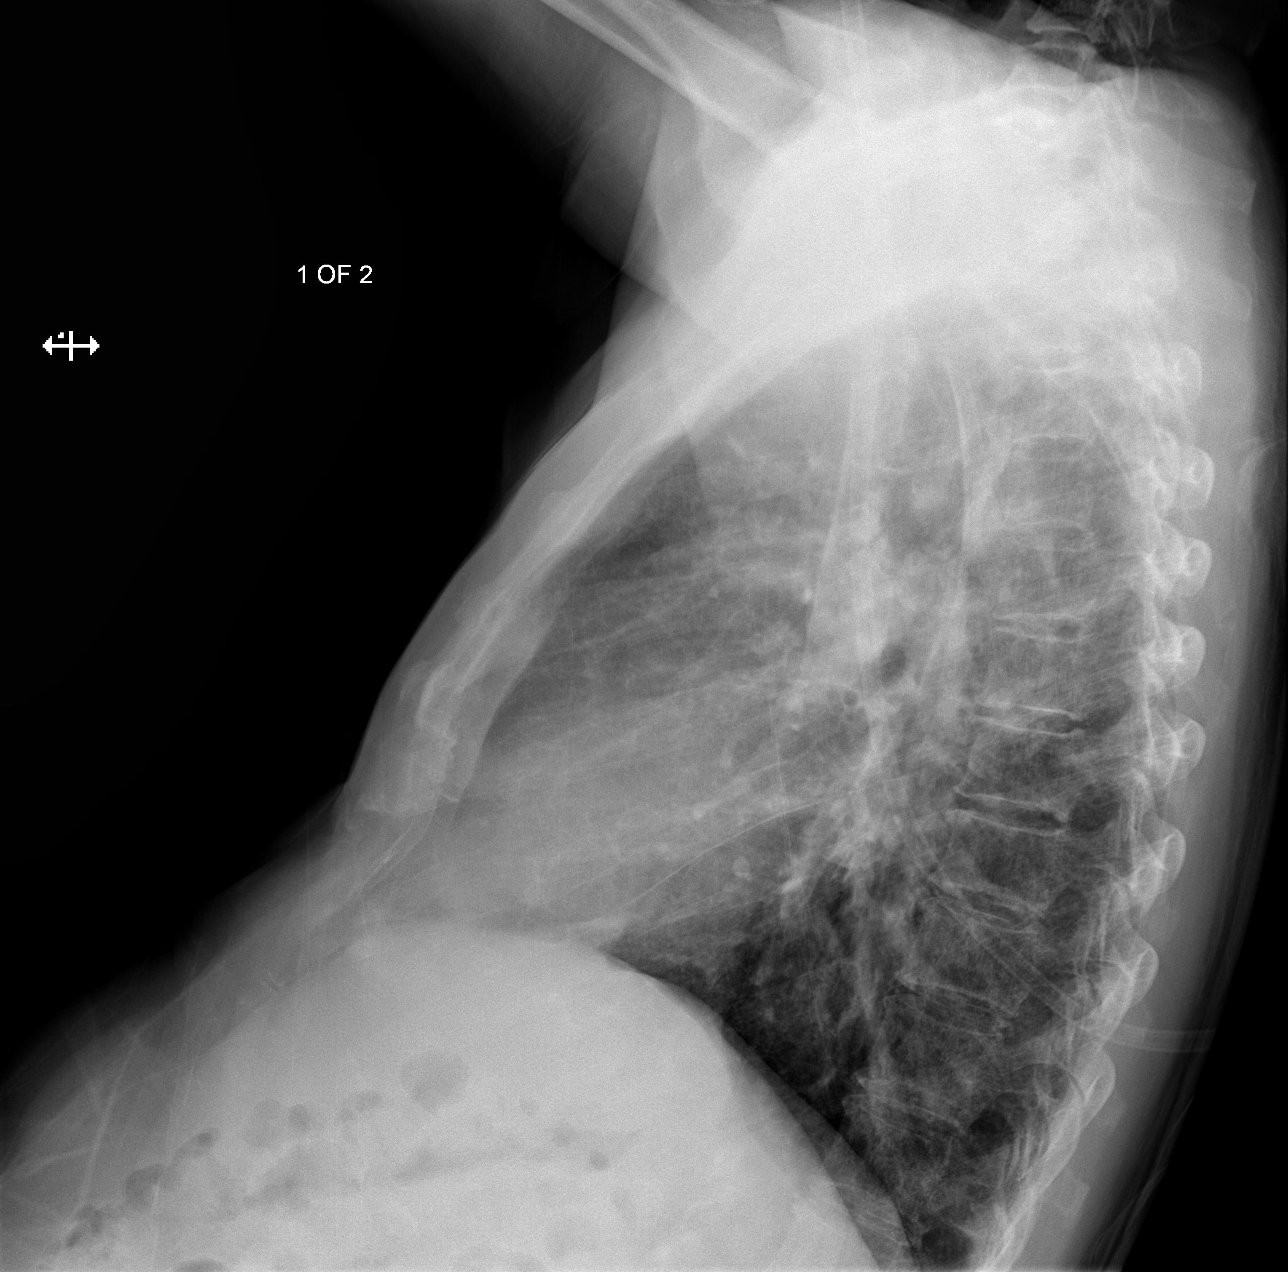
[im 3/3]
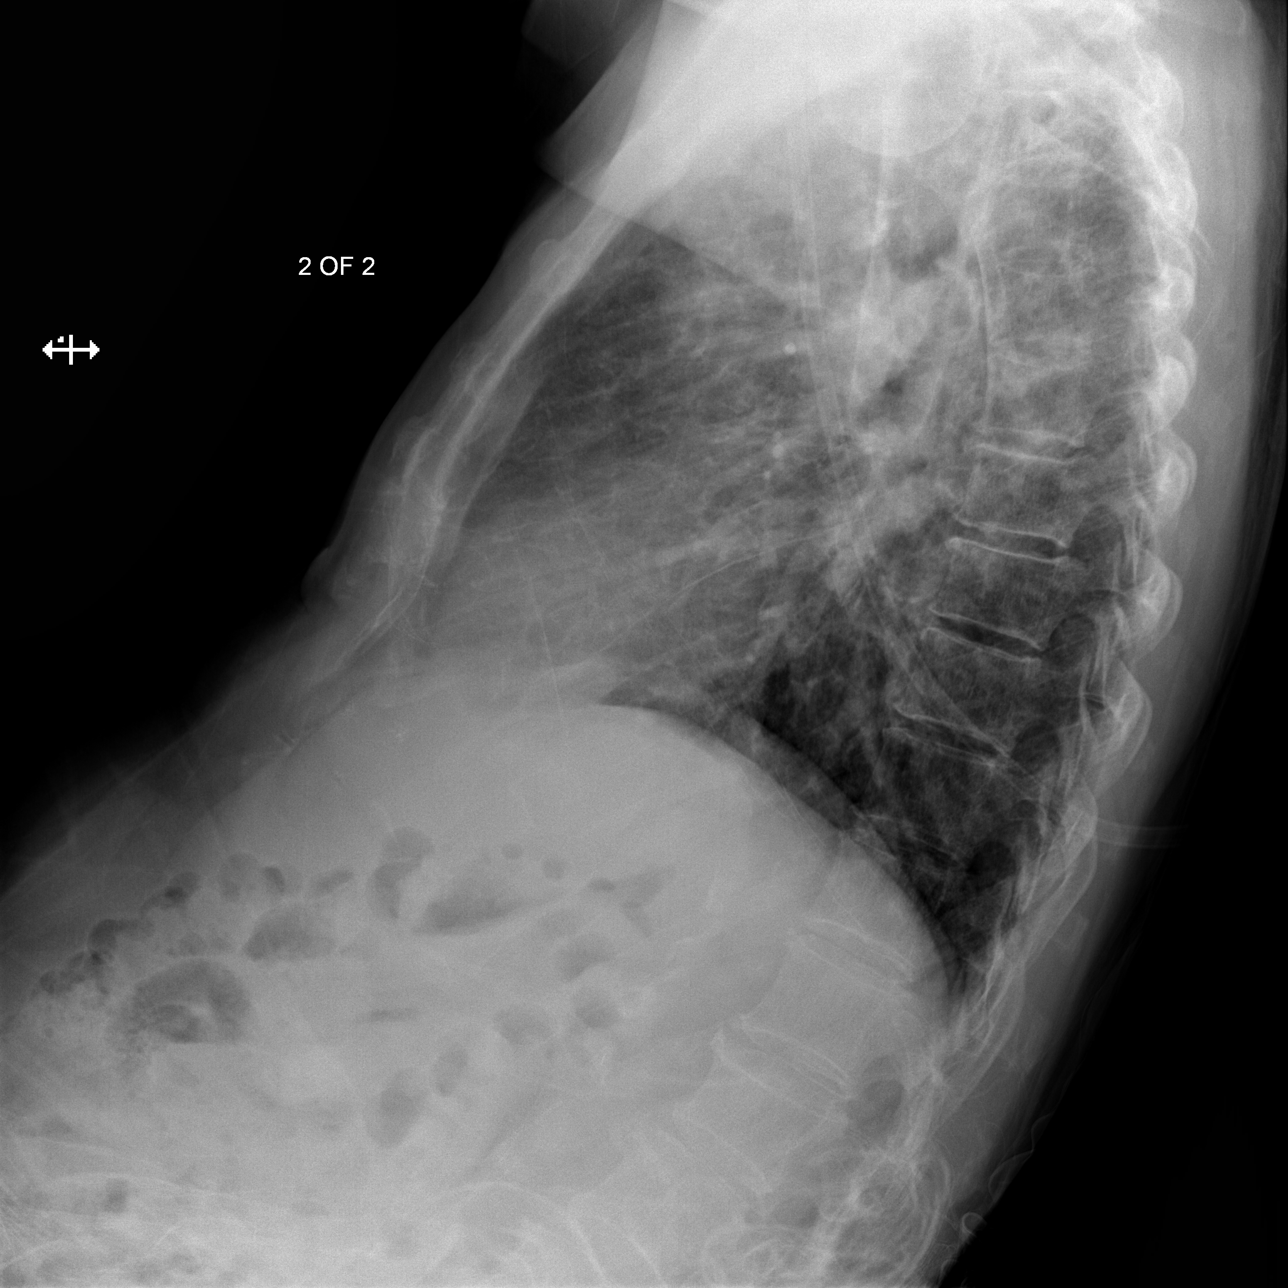

[3 of 3 positions shown; findings below may reference images not displayed]

FINDINGS: Overall aeration similar to the prior study with volume loss on the
right compared to the left. Calcified granulomata have a stable
appearance. No progressive consolidation, edema or pleural fluid
identified. No pneumothorax. Stable mediastinal contours. The heart
size is normal.
IMPRESSION: Stable chest x-ray without progressive airspace disease.

## 2014-02-06 LAB — CBC WITH DIFFERENTIAL/PLATELET
BASOS PCT: 0.2 %
Basophil #: 0 10*3/uL (ref 0.0–0.1)
Eosinophil #: 0 10*3/uL (ref 0.0–0.7)
Eosinophil %: 0.1 %
HCT: 42.3 % (ref 40.0–52.0)
HGB: 14.1 g/dL (ref 13.0–18.0)
LYMPHS ABS: 1.7 10*3/uL (ref 1.0–3.6)
LYMPHS PCT: 24.4 %
MCH: 31.6 pg (ref 26.0–34.0)
MCHC: 33.3 g/dL (ref 32.0–36.0)
MCV: 95 fL (ref 80–100)
MONOS PCT: 7.6 %
Monocyte #: 0.5 x10 3/mm (ref 0.2–1.0)
NEUTROS ABS: 4.7 10*3/uL (ref 1.4–6.5)
NEUTROS PCT: 67.7 %
PLATELETS: 336 10*3/uL (ref 150–440)
RBC: 4.47 10*6/uL (ref 4.40–5.90)
RDW: 13 % (ref 11.5–14.5)
WBC: 7 10*3/uL (ref 3.8–10.6)

## 2014-02-06 LAB — CLOSTRIDIUM DIFFICILE(ARMC)

## 2014-02-06 LAB — CULTURE, BLOOD (SINGLE)

## 2014-02-06 IMAGING — CR DG CHEST 2V
1 series · 2 of 2 positions shown · non-contrast
Comparison: PA and lateral chest of [DATE]

CLINICAL DATA: Follow-up of pneumonia; shortness of breath and
cough currently.

EXAM:
CHEST  2 VIEW

[Series 1: dxr chest pa (or ap) and lateral · 0.14mm/px · 2 of 2 slices shown]
[im 1/2]
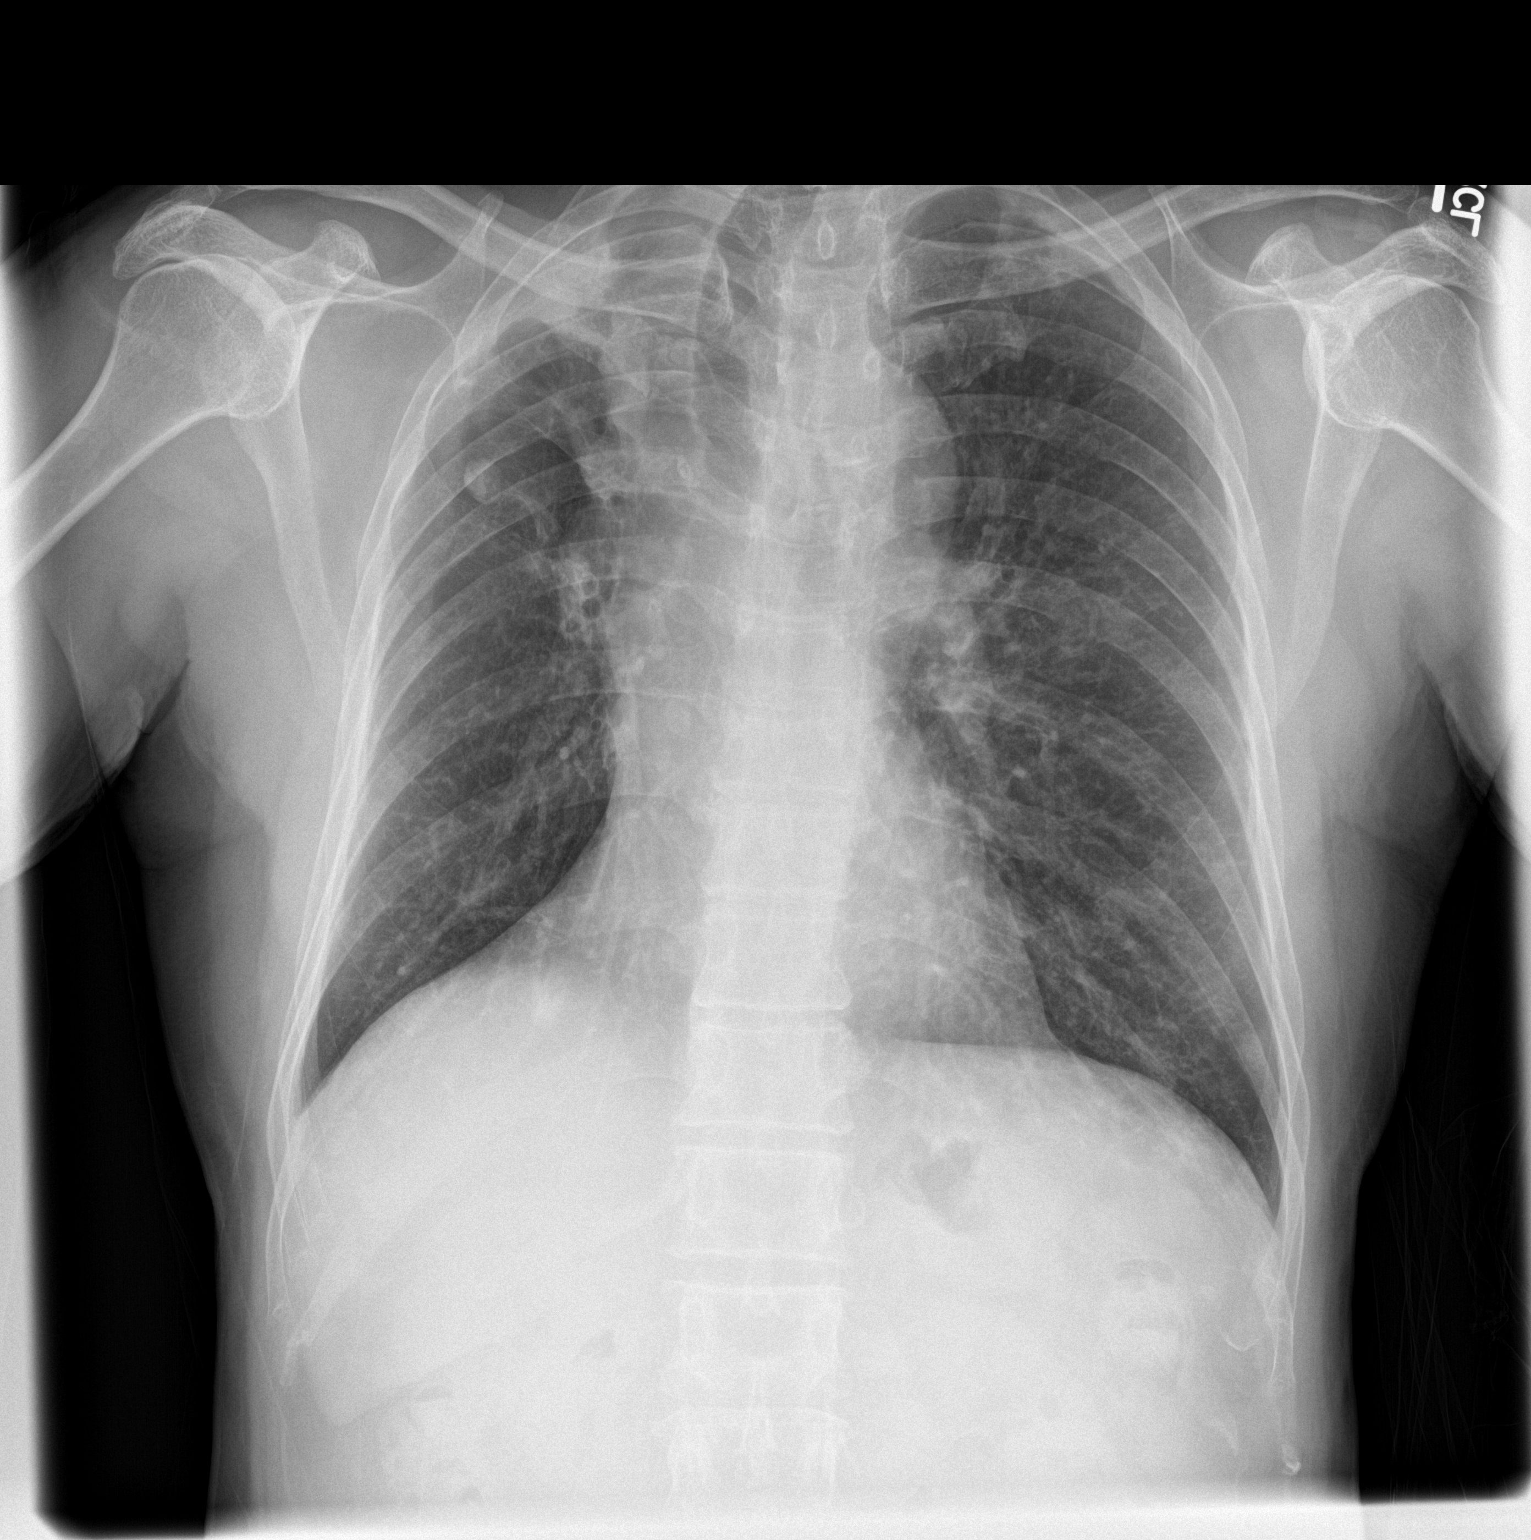
[im 2/2]
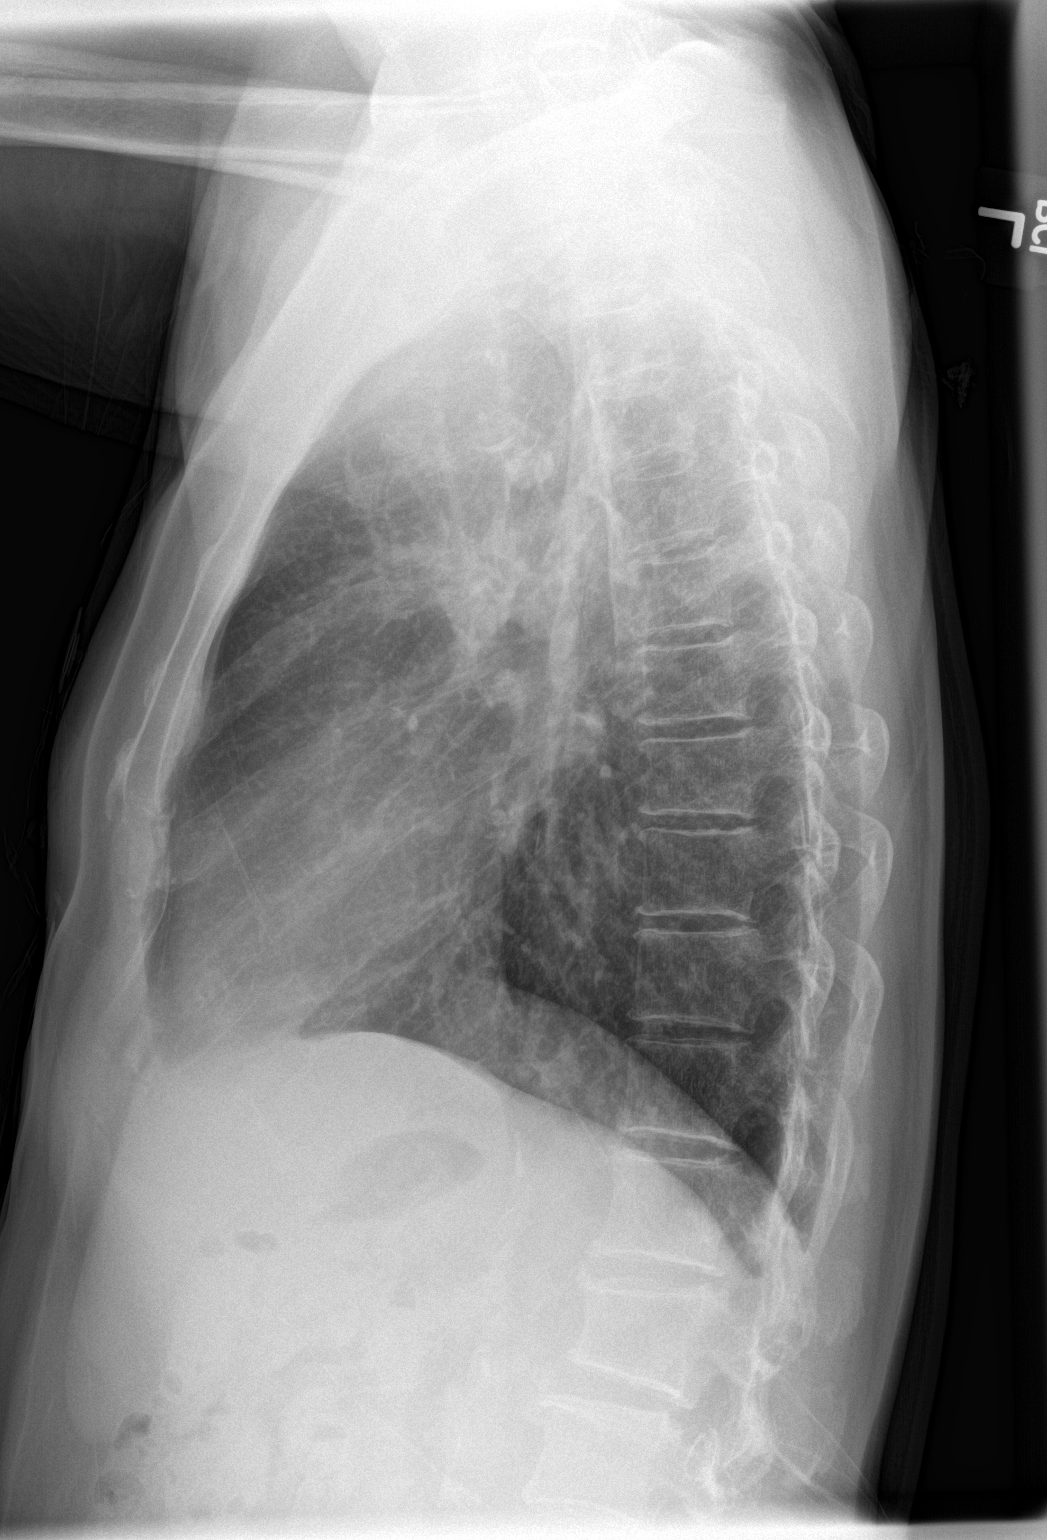

[2 of 2 positions shown; findings below may reference images not displayed]

FINDINGS: The lungs are better inflated today. There are persistent abnormal
densities in the right pulmonary apex. There is a stable 11 mm
diameter nodule in the right upper hemi thorax. The mediastinum is
widened. The cardiac silhouette is normal in size. The pulmonary
vascularity is not engorged. There is apical pleural thickening on
the left. The bony thorax is unremarkable.
IMPRESSION: There persistent abnormalities in both lungs greatest in the right
apex not significantly changed since yesterday's study allowing for
differences in radiographic technique and positioning. As noted on
the conclusion of the CT scan [REDACTED], reactivation
tuberculosis could present in this fashion.

## 2014-02-10 LAB — STOOL CULTURE

## 2014-07-19 NOTE — H&P (Signed)
PAT[facility]ondre, [facility] MR[facility]  [id number] DATE OF BIRTH:  1946/03/09  DATE OF ADMISSION:  02/02/2014  REFERRING PHYSICIAN: Algis Liming. Jimmye Norman, MD  PRIMARY CARE PHYSICIAN: Arlis Porta., MD  ADMISSION DIAGNOSIS: Reactivation tuberculosis.   HISTORY OF PRESENT ILLNESS: This is a 69 year old [place] man who presents to the Emergency Department complaining of shortness of breath and abdominal pain. The patient states his abdomen, and more specifically, his right side hurts every time he coughs. When he arrived to the Emergency Department he stated he was in 8/10 pain. It is unclear if he was referring to his shortness of breath or the pain from his side, but he states that he feels much better now that he has supplemental oxygen placed over his nose. He states that the coughing has dramatically worsened over the last 2 weeks and he felt as if he were going to die. His friend clarifies that the patient has been coughing for years now and admits to having fevers and night sweats. He has lost approximately 8 pounds over the last 2 months, which concerned him and prompted evaluation in the Emergency Department. A chest x-ray revealed right apical density, as well as some volume loss consistent with tuberculosis which prompted the Emergency Department staff to call for admission.   REVIEW OF SYSTEMS:  CONSTITUTIONAL: The patient admits to fevers, weight loss, and night sweats.  EYES: The patient admits to some eye pain and inflammation, but denies blurred vision.  EARS, NOSE, AND THROAT: Denies tinnitus or difficulty swallowing.  RESPIRATORY: Admits to cough and dyspnea.  CARDIOVASCULAR: Denies chest pain or palpitations.  GASTROINTESTINAL: Denies nausea, vomiting, or diarrhea, but admits to abdominal pain.  GENITOURINARY: Admits to increased urinary frequency, but denies dysuria or hesitancy.  ENDOCRINE: The patient admits to polyuria, but denies polydipsia.  HEMATOLOGIC AND LYMPHATIC: Denies  easy bruising or bleeding.  INTEGUMENTARY: Denies rashes or lesions.  MUSCULOSKELETAL: Denies arthralgias or myalgias.  NEUROLOGIC: Denies numbness in his extremities or dysarthria.  PSYCHIATRIC: Denies depression or anxiety.   PAST MEDICAL HISTORY: None.   PAST SURGICAL HISTORY: None.   FAMILY HISTORY: Unknown by the patient.    SOCIAL HISTORY: The patient is homeless and is currently moving from place to place living with friends. He denies smoking, drinking alcohol, or any drug use.   MEDICATIONS: None.   ALLERGIES: No known drug allergies.   PERTINENT LABORATORY RESULTS AND RADIOGRAPHIC FINDINGS: Serum glucose 151, BUN 14, creatinine 0.92, sodium 136, potassium 3.1, chloride 100, bicarbonate 25, calcium 9.4. Troponin is negative. White blood cell count is 11, hemoglobin is 15, hematocrit 44.4, platelet count 247,000. There is a normal differential. Urinalysis shows 50 mg of glucose, 100 mg of protein per deciliter, but there are but no nitrites or leukocyte esterase. Lactic acid is 2. Chest x-ray shows increased volume loss in right hemithorax with an apical density, as well as a new nodule in the left lung. CT scan of the chest with contrast shows calcified granulomas in the lungs with calcified right hilar nodes. There is some scarring with associated volume loss at the right apex as well as patchy nodular air space disease throughout both lungs that is consistent with an inflammatory process such as reactivation TB.   PHYSICAL EXAMINATION:  VITAL SIGNS: Temperature is 98.5, pulse 106, respirations 22, blood pressure 144/88, pulse oximetry 96% on 1 liter of oxygen via nasal cannula.  GENERAL: The patient is alert and oriented x 3, in no apparent distress.  HEENT:  Normocephalic, atraumatic. Pupils equal, round, and reactive to light and accommodation. Extraocular movements are intact. There is scleral icterus present. Mucous membranes are moist.  NECK: Trachea is midline. No adenopathy.   CHEST: Symmetric and atraumatic.  CARDIOVASCULAR: Regular rate and rhythm. Normal S1, S2. No rubs, clicks, or murmurs appreciated.  LUNGS: There is some inspiratory wheezing heard on the right, as well as some faint crackles heard in the posterior lung fields bilaterally. There is normal effort and excursion.  ABDOMEN: Positive bowel sounds. Soft, nontender, nondistended. There is no hepatosplenomegaly. The patient endorses some vague discomfort with deep palpation around the right flank inferior to his ribs. There is no costophrenic angle tenderness.  GENITOURINARY: Deferred.  MUSCULOSKELETAL: The patient moves all 4 extremities equally. There is 5/5 strength in upper and lower extremities bilaterally.  SKIN: No rashes or lesions visualized.  EXTREMITIES: No clubbing, cyanosis, or edema.  NEUROLOGIC: Cranial nerves II through XII are grossly intact.  PSYCHIATRIC: Mood is normal. Affect is congruent.   ASSESSMENT AND PLAN: This is a 69 year old male admitted for tuberculosis infection.  1.  Presumed tuberculosis. There is obvious lung involvement, but due to the patient's eye pain, as well as his right flank pain, in addition to polyuria, extrapulmonary evaluation of disease is warranted. I have ordered an ophthalmology consult and renal ultrasound to evaluate for miliary tuberculosis. The Emergency Department has initiated 4-drug therapy and we have obtained the first of 3 acid-fast bacteria cultures and smears. The plan is to obtain 2 more, one of which needs to be an early morning sputum and all of which need to be at least 8 hours apart. I have also ordered an HIV test. The patient has been placed in respiratory isolation  2.  Hypoxemia, mild. We will supply supplemental oxygen as needed. The patient does not have any effusions and at this time does not need any thoracentesis.  3.  Discharge planning. Due to homelessness and the need for quarantine and directly observed therapy, we will contact  the public health department for close followup.  4.  Deep vein thrombosis prophylaxis. Heparin.  5.  Gastrointestinal prophylaxis unnecessary as the patient is not critically ill.  6.  The patient is a FULL CODE.   TIME SPENT ON ADMISSION ORDERS AND PATIENT CARE: Approximately 45 minutes.    ____________________________ Norva Riffle. Marcille Blanco, MD msd:ts D: 02/02/2014 03:02:08 ET T: 02/02/2014 04:20:01 ET JOB#: [id number]  cc: Norva Riffle. Marcille Blanco, MD, <Dictator> Norva Riffle Deserea Bordley MD ELECTRONICALLY SIGNED 02/03/2014 0:13

## 2014-07-19 NOTE — Consult Note (Signed)
Pt seen and examined. Full consult to follow. No further bleeding. No abdominal pain. Hgb stable. Pt has had rectal bleeding episodes in the past. Pt does have external hemorrhoids. Can treat hemorrhoids. Due to his age and prior bleeding episodes, patient does need colonoscopy later as outpt. Have patient set up see me in few weeks if transportation can be arranged and there is no proof of TB. [place] set up colonoscopy then. Will sign off. Thanks.  Electronic Signatures: Verdie Shire (MD)  (Signed on 13-Nov-15 11:36)  Authored  Last Updated: 13-Nov-15 11:36 by Verdie Shire (MD)

## 2014-07-19 NOTE — Discharge Summary (Signed)
PATIENT NAMEDorell, Gatlin [facility] MR#:  [id number] DATE OF BIRTH:  1945/05/07  DATE OF ADMISSION:  02/01/2014 DATE OF DISCHARGE:    ADMITTING PHYSICIAN: Dr. Marcille Blanco.   DISCHARGING PHYSICIAN:  Dr. Gladstone Lighter.    PRIMARY CARE PHYSICIAN: None.   CONSULTATIONS IN THE HOSPITAL:   1.  ID consultation by Dr. Ola Spurr.  2.  GI consultation with Dr. Candace Cruise.    DISCHARGE DIAGNOSES:  1.  Bacterial pneumonia.  2.  History of tuberculosis treated in the past, currently AFB negative x 3 so no active tuberculosis at this time.   3.  Depression.  4.  Rectal bleed, likely hemorrhoidal.   DISCHARGE MEDICATIONS:   1. Celexa 20 mg p.o. daily.  2. Advair 250/50 one puff inhalation b.i.d.  3. Prednisone taper over 5 days.  4. Levaquin 500 mg p.o. daily for 6 more days.  5. Albuterol inhaler 2 puffs q. 6 hours p.r.n. for shortness of breath or wheezing.   DISCHARGE HOME OXYGEN: None.   DISCHARGE DIET: Regular diet.   DISCHARGE ACTIVITY: As tolerated.    FOLLOWUP INSTRUCTIONS:  1.  PCP followup in 1-2 weeks.  2.  GI followup in 2-3 weeks.   LABORATORIES AND IMAGING STUDIES PRIOR TO DISCHARGE:  1.  WBC 7.0, hemoglobin 14.1, hematocrit 42.3, platelet count 336,000.  2.  Sodium 136, potassium 4.1, chloride 102, bicarbonate 25, BUN 18, creatinine 0.87, glucose 225, calcium of 9.1.  3.  AFB smears x 3 are negative.  4.  Cultures are pending at this time.  5.  Echocardiogram showing normal LV ejection fraction, EF of 60%-65%, normal RVSP noted. 6.  Chest x-ray showing no progressive airspace disease, volume loss on the right side compared to the left, stable calcified granulomata, no consolidation, edema, pleural effusion, or pneumothorax.  7.  Stool studies for Clostridium difficile and cultures are negative.  8.  Ultrasound of the kidneys bilaterally showing normal renal ultrasound.  9.  CT of the chest on admission showing calcified granulomas in the lung with calcified right hilar nodes, calcification  and scarring in the right apex, associated volume loss, patchy nodular airspace disease throughout both lungs, could be inflammatory process or reactivation of TB.  BRIEF HOSPITAL COURSE: Mr. 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 Initially he was kept in isolation while awaiting AFB smears.    1.  Pneumonia initially thought to be tuberculosis reactivation based on the CT and also chest x-ray, however after ID has seen the patient they suggested that it would be less likely tuberculosis and more likely bacterial pneumonia. His AFB smears came back negative x 3 and he was treated for his history of tuberculosis in the past. At the current time he does not have any reactivation of his tuberculosis.  His symptoms improved with prednisone and antibiotics with Rocephin and azithromycin in the hospital. He is being discharged on Levaquin, prednisone taper, and inhalers.  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 He probably has some underlying COPD or reactive airway disease. 2.  Rectal bleed. The patient had a couple of episodes of stool with bright red blood seen by GI, likely  hemorrhoidal bleed, they recommended outpatient colonoscopy at this point.  3.  Depression, very depressed on admission. Started on Celexa with much improved symptoms. 4.  The patient was living with his friends prior to the admission, does not have any other family in the [place] and because of questionable tuberculosis diagnosis on admission his friends are failing to take him back, so the patient will be discharged to a homeless shelter. The  patient is hopeful that his friends might take him back at some point.  5.  His course has been otherwise uneventful in the hospital. Most of the communication has been through a [place] in person Optometrist.   DISCHARGE CONDITION: Stable.   DISCHARGE DISPOSITION:  Homeless shelter.    TIME SPENT ON DISCHARGE: 45 minutes.    ____________________________ Gladstone Lighter, MD rk:bu D: 02/07/2014 13:45:46 ET T: 02/07/2014 16:14:49 ET JOB#: [id number]  cc: Gladstone Lighter, MD, <Dictator> Gladstone Lighter MD ELECTRONICALLY SIGNED 02/15/2014 15:00

## 2014-07-19 NOTE — Consult Note (Signed)
Another smaller rectal bleeding. According to nurse, BRBPR. Hgb is back to normal. C.diff neg. Told nurse to contact when interpreter shows up. Will wait for interpreter to talk to patient. thanks   Electronic Signatures: Verdie Shire (MD) (Signed on 13-Nov-15 07:49)  Authored   Last Updated: 00-VLD-44 10:28 by Verdie Shire (MD)

## 2014-07-19 NOTE — Consult Note (Signed)
Will see patient tomorrow once interpreter present. 1 episode of rectal bleeding. Hgb stable. AFP negative  x 2. Further recommendations after I evaluate patient. Thanks.  Electronic Signatures: Verdie Shire (MD)  (Signed on 12-Nov-15 18:56)  Authored  Last Updated: 12-Nov-15 18:56 by Verdie Shire (MD)

## 2014-07-19 NOTE — Consult Note (Signed)
PATIENT NAMESwain, [facility] MR#:  [id number] DATE OF BIRTH:  11/20/1945  DATE OF CONSULTATION:  02/07/2014  REFERRING PHYSICIAN:   CONSULTING PHYSICIAN:  Lupita Dawn. Brittnay Pigman, MD  REASON FOR REFERRAL: Except rectal bleeding.   DESCRIPTION: The patient is a 69 year old, [place]  male who was initially admitted on November 8, with a few-week history of coughing and chest pain and fevers, as well as weight loss. The patient apparently was treated for TB n the past. Because of concern for reactivation of TB, the patient was brought in and placed in isolation.   I was asked to see the patient because he started having some gross rectal bleeding a few nights ago. Apparently, the patient has had several episodes of bleeding since coming in. I had an interpreter with me to get the history. Fortunately for him, the most recent bowel movement was normal in color. There was no blood. The patient denies any abdominal pain or cramping. According to him, the patient has had episodes of bleeding in the past. The last episode was about 6 months ago, and the one before that was about a year ago. Each episode has resolved on its own. The patient also recalls having some hemorrhoids in the past but does not recall having hemorrhoids right now. There is no anal itching or anal pain. There is no nausea, vomiting or heartburn or indigestion at this point. The patient is currently on antibiotics. He has had 2 AFB smears read, so far. Both of them have been negative. The third one will be read later this afternoon. The patient does not drink or smoke. There is no other past medical history.   PAST MEDICAL HISTORY: Negative.   PAST SURGICAL HISTORY: Negative.   FAMILY HISTORY: Negative.  SOCIAL HISTORY: Moved here in November 1998. Currently, he is homeless. He has been living with friends but because of the potential of TB, no one is willing to take him into his home.   ALLERGIES: He has no known drug allergies.   PHYSICAL  EXAMINATION:  GENERAL: The patient appears to be in no acute distress. His vital signs are stable.  HEAD AND NECK: Examination is normal.  CARDIAC: Regular rhythm and rate.  LUNGS: Clear bilaterally at this time.  ABDOMEN: Soft and nontender throughout. No hepatomegaly. There are no palpable masses. He has active bowel sounds.  EXTREMITIES: No clubbing, cyanosis, or edema.  RECTAL: Examination does show a little bit of external hemorrhoids, although I do not see any bleeding right now.   In terms of labs, his hemoglobin was 15.0 on admission on November 7 and on November 10, it was 12.9, but it was back to normal at 14.1 yesterday.   C. difficile was negative. Ova and parasites were negative.   ASSESSMENT AND PLAN: This is a patient with a few episodes of rectal bleeding, which since appears to have resolved. He does have external hemorrhoids. It is possible he could have some hemorrhoidal bleeding. I would recommend treating the hemorrhoids with some cream. If the third AFB is negative, it is reasonable to discharge the patient on some antibiotics.   The patient does need a colonoscopy scheduled at some point because of his age and episodes of rectal bleeding. He is currently homeless. Please set up an appointment to see me in a couple of weeks, if transportation can be arranged. We will schedule a colonoscopy then, if there is no proof of TB. Transportation will also need to be arranged at the  time of colonoscopy as well. Thank you for the referral.    ____________________________ Lupita Dawn. Candace Cruise, MD pyo:je D: 02/07/2014 11:41:00 ET T: 02/07/2014 12:12:03 ET JOB#: [id number]  cc: Lupita Dawn. Candace Cruise, MD, <Dictator> Lupita Dawn Mandalyn Pasqua MD ELECTRONICALLY SIGNED 02/13/2014 14:51

## 2014-07-19 NOTE — Consult Note (Signed)
PATIENT NAMERenso, [facility] MR#:  [id number] DATE OF BIRTH:  27-Aug-1945  DATE OF CONSULTATION:  02/03/2014  REFERRING PHYSICIAN:   CONSULTING PHYSICIAN:  Cheral Marker. Ola Spurr, MD  REQUESTING PHYSICIAN:  Dr. Marcille Blanco.    REASON FOR CONSULTATION:  Pneumonia, evidence of possible TB.   HISTORY OF PRESENT ILLNESS: This is a very pleasant 69 year old gentleman who is originally from [place], having come to the [place] in 1998. He has been feeling ill for the last several weeks with a cough and then increasing chest pain, especially in his right side when he coughs. He has also had some subjective fevers and about an 8 pound weight loss. He was apparently also having some mild sweats. He came to the Emergency Room where there was concern for TB based on CT and chest x-ray. He was admitted and is in isolation.   History is obtained through the [place] interpreter. After further discussion with the patient he does report that at one point he had an abnormal chest x-ray and was treated with medications for 6 months. It is unclear if this was suppressive therapy with INH or whether this was full treatment for TB. He does not smoke, never has, and does not drink. 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 He denies any other past medical history.    PAST MEDICAL HISTORY: None.   PAST SURGICAL HISTORY: None.   FAMILY HISTORY: Noncontributory.   SOCIAL HISTORY: The patient has no family. He came here in 75 from [place]. He is homeless, moves from place to place and lives with friends.   OUTPATIENT MEDICATIONS:  None.    ALLERGIES: No known drug allergies.   PHYSICAL EXAMINATION:  VITAL SIGNS: Temperature 97.8, pulse 100, blood pressure 178/77, respirations 20, saturation 96% on 1 liter.  GENERAL: He is thin, lying in bed. He does appear to have some respiratory distress.   HEENT: Pupils are equal, round, reactive to light and accommodation.  Extraocular movements are intact. Sclerae are anicteric. His oropharynx is clear with no thrush.  NECK: Supple. No anterior cervical, posterior cervical, or supraclavicular lymphadenopathy.  HEART: Regular with no murmurs.  LUNGS: Diffuse rhonchi as well as expiratory wheeze.  ABDOMEN: Soft, mildly distended, nontender. No hepatosplenomegaly.  EXTREMITIES: No clubbing, cyanosis, or edema.  NEUROLOGIC: He is alert and oriented x 3. Grossly nonfocal neurologic exam.  PSYCHIATRIC: He has got quite a flat affect and reports depression.    DATA:  CT of the chest shows calcified granuloma in the lungs with calcified right hilar nodes. There is calcification and scar in the right apex with associated volume loss. There is patchy nodular airspace disease demonstrated throughout both lungs. Chest x-ray showed increased volume loss in right hemithorax with apical density and a new nodule in the left lower lobe. Ultrasound of the kidneys bilaterally showed normal renal ultrasound. HIV testing is negative. PSA was normal. Urinalysis showed 1 white cell. Blood cultures 2 out of 2 are no growth to date. White count on admission 11.0, hemoglobin 15.0, platelet 247,000, neutrophil count 7.6. Troponin is negative. Bilirubin normal. Renal function showed creatinine of 0.92. Glucose elevated slightly at 151.   IMPRESSION: A 69 year old gentleman, originally from [place], here since 1998, admitted with 2 weeks of cough and increasing shortness of breath as well as some weight loss and loss of appetite. CT shows calcified granulomas as well as calcified hilar lymph node. Based on the history of being  treated for 6 months with medication for a "spot on his lung" this suggests that he had prior tuberculosis and has been treated. Currently he has multifocal infiltrate on his CT which would suggest more community-acquired pneumonia.   RECOMMENDATIONS:  1.  I would stop anti-TB medications at this time.  2.  Treat for  community-acquired pneumonia with ceftriaxone and azithromycin.  3.  Check routine sputum culture as well as 3 AFBs.   4.  Check routine culture.  5.  Thank you for the consult. I will be glad to follow with you.    ____________________________ Cheral Marker. Ola Spurr, MD dpf:bu D: 02/03/2014 15:46:49 ET T: 02/03/2014 16:14:18 ET JOB#: [id number]  cc: Cheral Marker. Ola Spurr, MD, <Dictator> Juliani Laduke Ola Spurr MD ELECTRONICALLY SIGNED 02/04/2014 17:38

## 2017-01-10 ENCOUNTER — Ambulatory Visit
Admission: RE | Admit: 2017-01-10 | Discharge: 2017-01-10 | Disposition: A | Discharge status: Home / Self Care | Payer: Medicare Other | Source: Ambulatory Visit | Attending: Nurse Practitioner | Admitting: Nurse Practitioner

## 2017-01-10 ENCOUNTER — Encounter: Payer: Self-pay | Admitting: Nurse Practitioner

## 2017-01-10 ENCOUNTER — Ambulatory Visit (INDEPENDENT_AMBULATORY_CARE_PROVIDER_SITE_OTHER): Payer: Medicare Other | Admitting: Nurse Practitioner

## 2017-01-10 VITALS — BP 154/67 | HR 76 | Temp 98.2°F | Ht 59.25 in | Wt 110.2 lb

## 2017-01-10 DIAGNOSIS — Z23 Encounter for immunization: Secondary | ICD-10-CM

## 2017-01-10 DIAGNOSIS — R05 Cough: Secondary | ICD-10-CM | POA: Insufficient documentation

## 2017-01-10 DIAGNOSIS — R053 Chronic cough: Secondary | ICD-10-CM

## 2017-01-10 DIAGNOSIS — R918 Other nonspecific abnormal finding of lung field: Secondary | ICD-10-CM | POA: Insufficient documentation

## 2017-01-10 DIAGNOSIS — Z8639 Personal history of other endocrine, nutritional and metabolic disease: Secondary | ICD-10-CM | POA: Diagnosis not present

## 2017-01-10 DIAGNOSIS — Z7689 Persons encountering health services in other specified circumstances: Secondary | ICD-10-CM | POA: Diagnosis not present

## 2017-01-10 DIAGNOSIS — G8929 Other chronic pain: Secondary | ICD-10-CM | POA: Diagnosis not present

## 2017-01-10 DIAGNOSIS — Z13220 Encounter for screening for lipoid disorders: Secondary | ICD-10-CM

## 2017-01-10 DIAGNOSIS — I10 Essential (primary) hypertension: Secondary | ICD-10-CM | POA: Diagnosis not present

## 2017-01-10 DIAGNOSIS — M25511 Pain in right shoulder: Secondary | ICD-10-CM

## 2017-01-10 DIAGNOSIS — Z131 Encounter for screening for diabetes mellitus: Secondary | ICD-10-CM | POA: Diagnosis not present

## 2017-01-10 DIAGNOSIS — E119 Type 2 diabetes mellitus without complications: Secondary | ICD-10-CM

## 2017-01-10 IMAGING — DX DG CHEST 2V
3 series · 3 of 3 positions shown · non-contrast
Comparison: [DATE]

CLINICAL DATA: Dry cough for several years

EXAM:
CHEST  2 VIEW

[chest pa (1 of 2)]
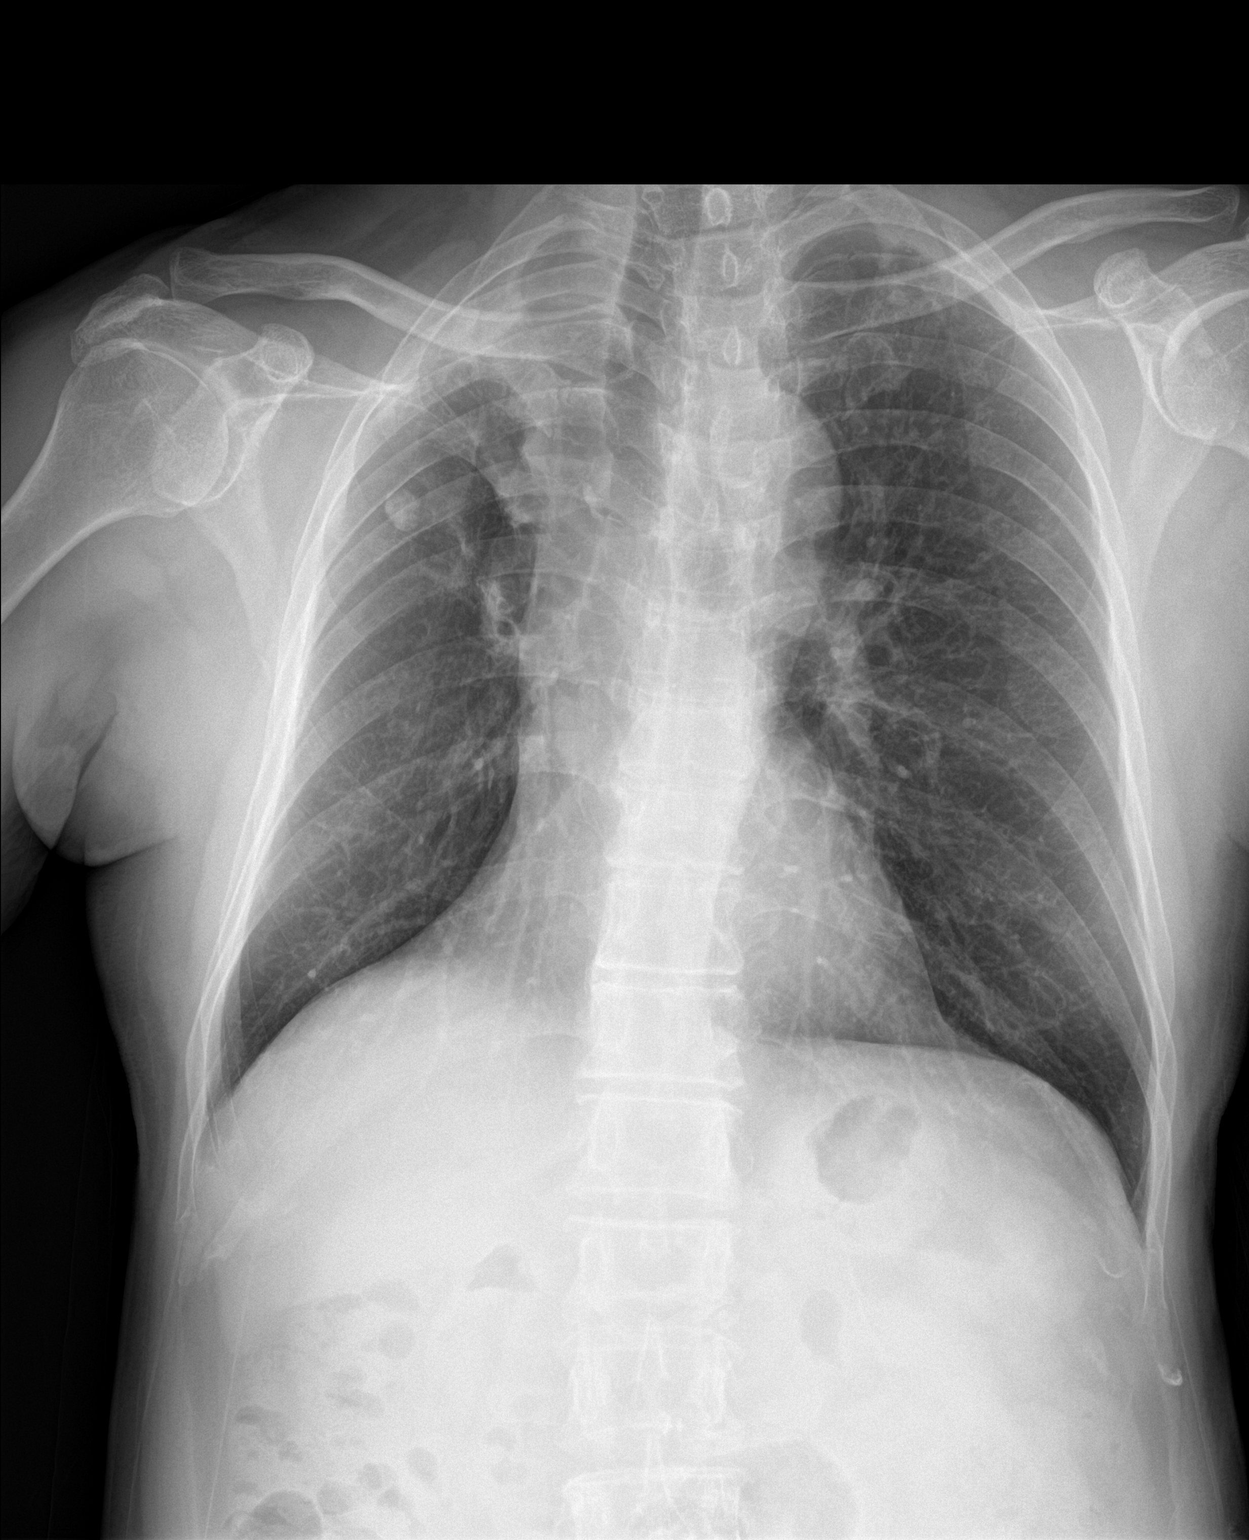

[chest lat]
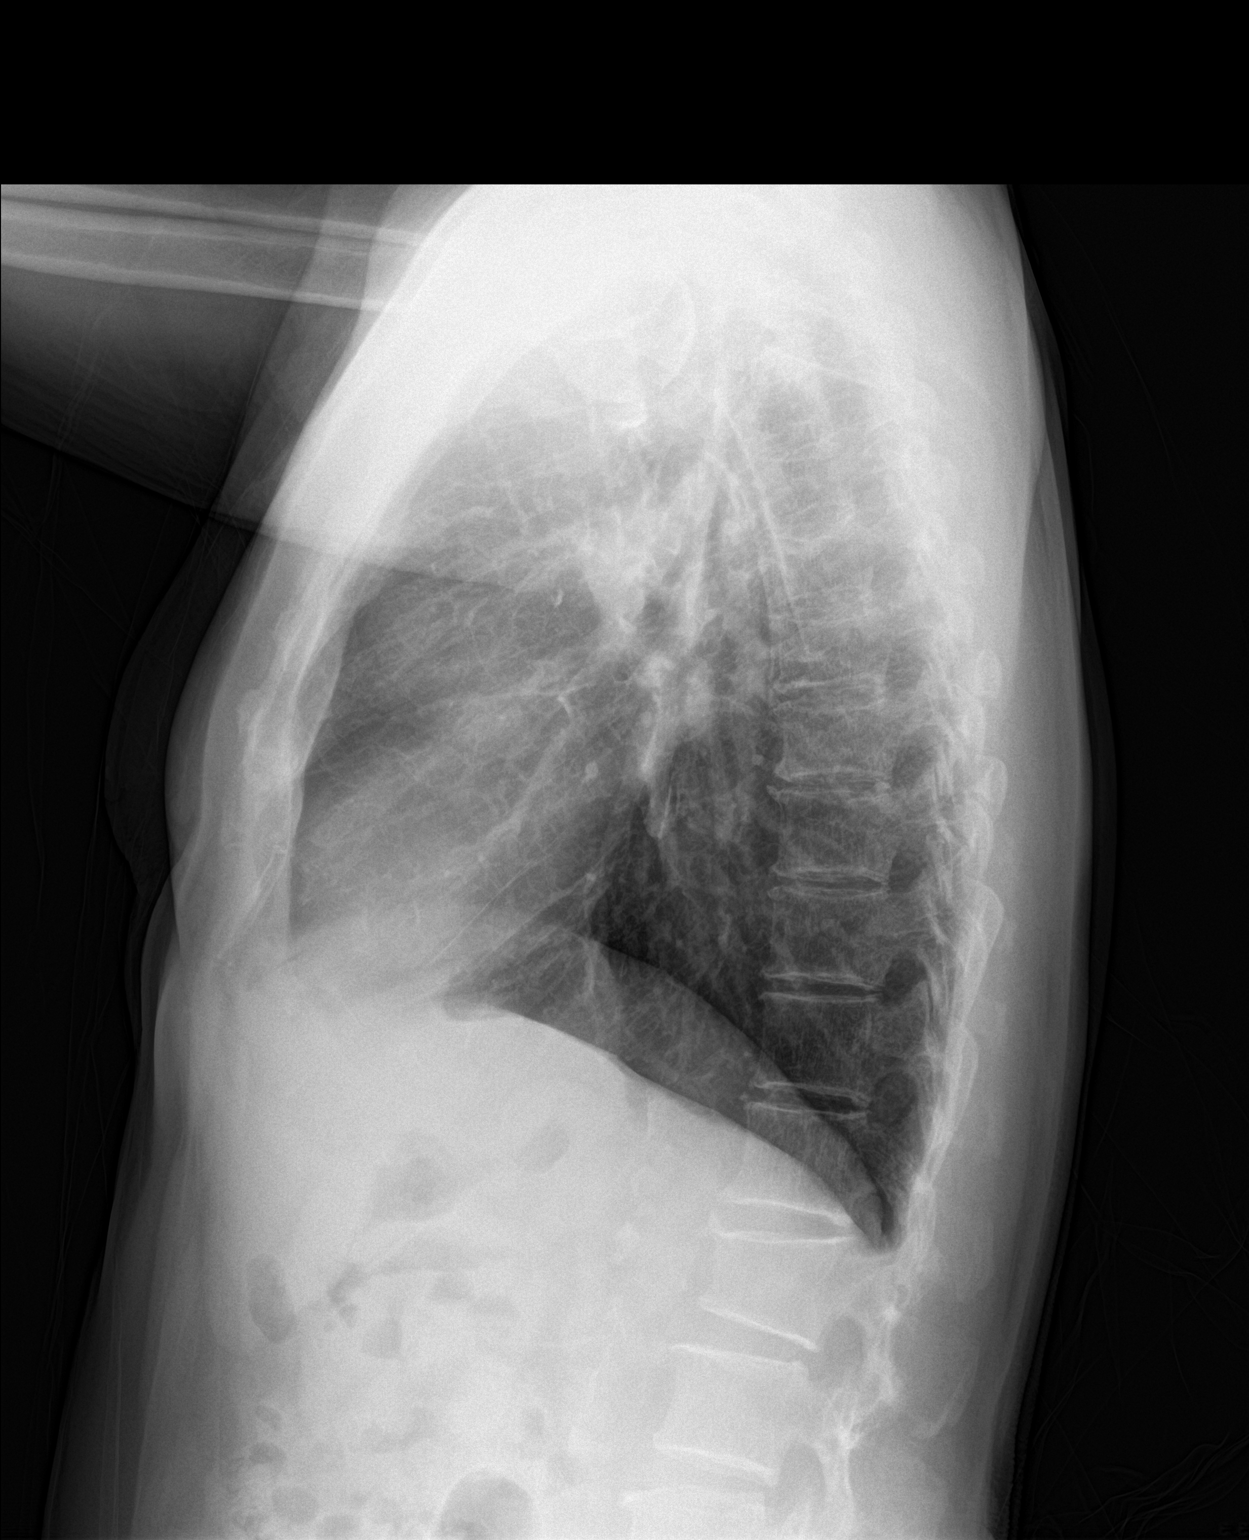

[chest pa (2 of 2)]
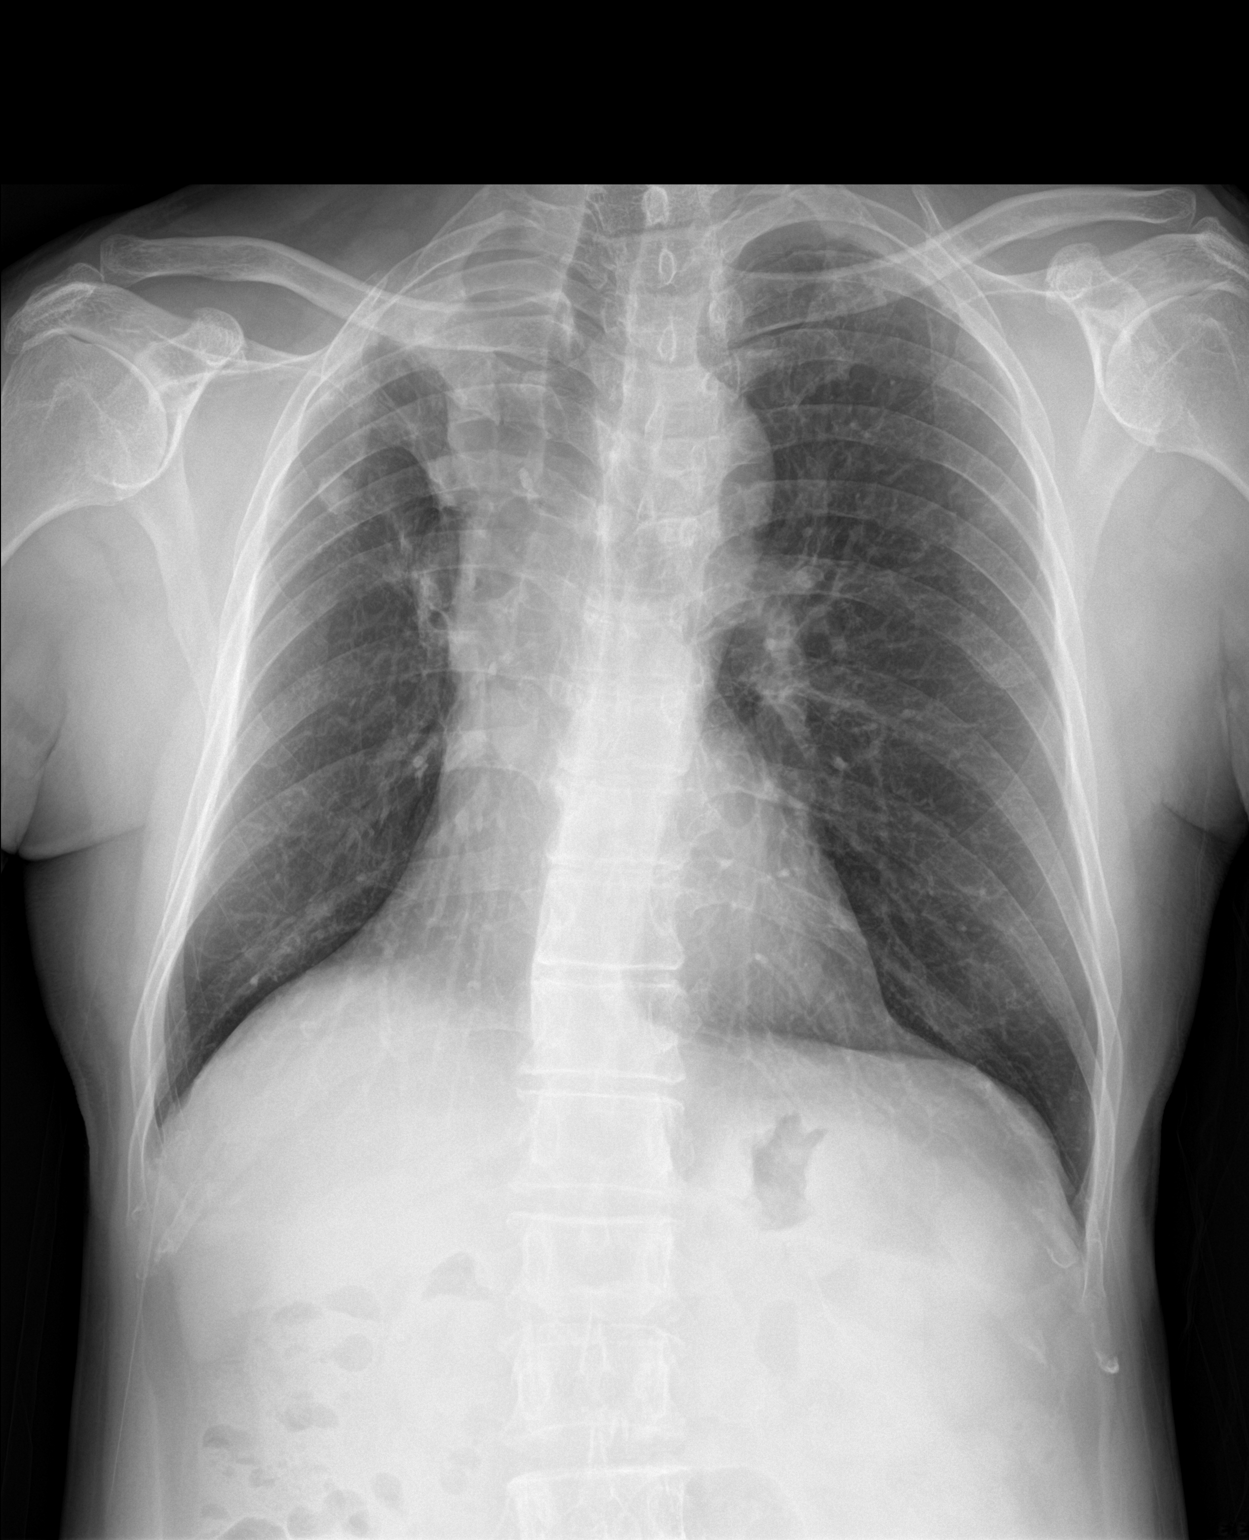

[3 of 3 positions shown; findings below may reference images not displayed]

FINDINGS: Cardiac shadow is stable. The left lung is clear. The right lung
demonstrates considerable scarring in the apex stable from the prior
exam. Calcified granuloma is noted as well which is stable. These
changes are similar to that seen on the prior chest CT of
[DATE]. No new focal abnormality is noted. Stable compression
deformity in the upper lumbar spine is noted.
IMPRESSION: Stable changes in the right apex from [48] likely related to chronic
granulomatous disease no new focal abnormality is seen.

## 2017-01-10 MED ORDER — LIDOCAINE HCL 3 % EX LOTN: 1.0000 "application " | TOPICAL_LOTION | Freq: Four times a day (QID) | CUTANEOUS | 5 refills | Status: DC | PRN

## 2017-01-10 MED ORDER — SPACER/AERO-HOLD CHAMBER BAGS MISC: 1.0000 | Freq: Four times a day (QID) | 1 refills | Status: DC | PRN

## 2017-01-10 MED ORDER — ALBUTEROL SULFATE HFA 108 (90 BASE) MCG/ACT IN AERS: 1.0000 | INHALATION_SPRAY | Freq: Four times a day (QID) | RESPIRATORY_TRACT | 5 refills | Status: DC | PRN

## 2017-01-10 MED ORDER — HYDROCHLOROTHIAZIDE 12.5 MG PO TABS: 12.5000 mg | ORAL_TABLET | Freq: Every day | ORAL | 5 refills | Status: DC

## 2017-01-10 NOTE — Progress Notes (Addendum)
Subjective:    Patient ID: Jonathan Willis, male    DOB: 09/17/1945, 71 y.o.   MRN: [id number]  Jonathan Willis is a 71 y.o. male presenting on 01/10/2017 for Establish Care (Coughing, SOB, x chronic ) Pt is accompanied today by his son, Remo Lipps.    Telephone interpreter service used for [place].     Interpreter name: Suezanne Jacquet  ID#: [id number]  HPI [facility] Provider Pt does not have a PCP in the [place].  He relocated from [place] several years ago and has had a history of fragmented care.  Last office visit with a medical provider was w/ Dr.Hawkins at [facility] in 2014.  [facility] records reviewed and single hospitalization from [facility] conversion was available in [place].  Chronic Cough Pt has had cough daily for last 5-6 months.  Cough is non-productive and is not worsened by activity.  He has smoking hx of 20 cig in 2 weeks and has smoked for 20 years.  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  He has now been working at [employer] in [place] for about 4 years. - Denies chills, fever, sweats, and night sweats.  States he took Advair in past and this inhaler helped him w/ cough and breathing.  He only took it for about 1 month, then did not continue. - Has not had PFTs or other spirometry. - Prior ppd positive (2014) and was treated/tested and confirmed negative. Has no recurrent symptoms and no productive/bloody cough.  Denies night sweats.  No other sick contacts.  Headaches Pt reports severe right sided headaches described as severe daily headache lasting > 2 years.  He does not regularly take medication for these headaches.  Poor historian for additional details.  Pt reported history of Hypertension - He is not checking BP at home or outside of clinic.    - Current medications: none,  - He is not currently symptomatic. - Pt denies headache, lightheadedness, dizziness, changes in vision, chest tightness/pressure,  palpitations, leg swelling, sudden loss of speech or loss of consciousness.  Pt reported history of Diabetes He is not checking CBG at home - Current diabetic medications include: none - He is not currently symptomatic.  - He denies polydipsia, polyphagia, polyuria, headaches, diaphoresis, shakiness, chills, pain, numbness or tingling in extremities and changes in vision.     PREVENTION: Eye exam current (within one year): unknown Foot exam current (within one year): unknown Lipid/ASCVD risk reduction - on statin: none Kidney protection - on ace or arb: none  Recent Labs  01/10/17 1201  HGBA1C 6.5*   Past Medical History:  Diagnosis Date  . Diabetes mellitus without complication ([place])   . External hemorrhoid   . 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 History of TB (tuberculosis)   . Hypertension   . Lower GI bleed     Past Surgical History:  Procedure Laterality Date  . NO PAST SURGERIES     Social History   Social History  . Marital status: Single    Spouse name: N/A  . Number of children: N/A  . Years of education: N/A   Occupational History  . Not on file.   Social History Main Topics  . Smoking status: Former Smoker    Quit date: 01/11/2007  . Smokeless tobacco: Never Used  . Alcohol use No  . Drug use:  No  . Sexual activity: Not on file   Other Topics Concern  . Not on file   Social History Narrative  . No narrative on file   No family history on file. No current outpatient prescriptions on file prior to visit.   No current facility-administered medications on file prior to visit.   Declines use of herbal remedies/supplements.  Review of Systems  Constitutional: Negative.   HENT: Negative.   Eyes: Negative.   Respiratory: Positive for cough.   Cardiovascular: Negative.   Gastrointestinal: Negative.   Endocrine: Negative.   Genitourinary: Positive for difficulty urinating.    Musculoskeletal: Positive for arthralgias.       Right shoulder  Skin: Negative.   Neurological: Positive for headaches. Negative for dizziness and light-headedness.  Hematological: Negative.   Psychiatric/Behavioral: Negative.    Per HPI unless specifically indicated above     Objective:    BP (!) 154/67 (BP Location: Right Arm, Patient Position: Sitting, Cuff Size: Normal)   Pulse 76   Temp 98.2 F (36.8 C) (Oral)   Ht 4' 11.25" (1.505 m)   Wt 110 lb 3.2 oz (50 kg)   SpO2 100%   BMI 22.07 kg/m   Wt Readings from Last 3 Encounters:  01/10/17 110 lb 3.2 oz (50 kg)    Physical Exam  General - healthy, well-appearing, NAD HEENT - Normocephalic, atraumatic Neck - supple, non-tender, no LAD, no thyromegaly, no carotid bruit Heart - RRR, no murmurs heard Lungs - Wheezing and rhonchi throughout all lobes.  No crackles noted. Normal work of breathing. Musculoskeletal - Right Shoulder Inspection: Normal appearance bilateral symmetrical Palpation: Non-tender to palpation over anterior, lateral, or posterior shoulder ROM: Full intact active ROM forward flexion,  internal / external rotation, symmetrical except reduced ROM w/ abduction Right shoulder.  Special Testing: Rotator cuff testing negative for weakness with supraspinatus full can and empty can test, Lift off test positive Strength: Normal strength 5/5 flex/ext, ext rot / int rot, grip, rotator cuff str testing. Neurovascular: Distally intact pulses, sensation to light touch Extremeties - non-tender, no edema, cap refill < 2 seconds, peripheral pulses intact +2 bilaterally Skin - warm, dry Neuro - awake, alert, oriented x3, CN II-X intact, intact muscle strength 5/5 bilaterally, intact distal sensation to light touch, normal coordination, normal gait Psych - Normal mood and affect, normal behavior    Results for orders placed or performed in visit on 01/10/17  Hemoglobin A1c  Result Value Ref Range   Hgb A1c MFr Bld 6.5  (H) <5.7 % of total Hgb   Mean Plasma Glucose 140 (calc)   eAG (mmol/L) 7.7 (calc)  Lipid panel  Result Value Ref Range   Cholesterol 236 (H) <200 mg/dL   HDL 44 >40 mg/dL   Triglycerides 175 (H) <150 mg/dL   LDL Cholesterol (Calc) 160 (H) mg/dL (calc)   Total CHOL/HDL Ratio 5.4 (H) <5.0 (calc)   Non-HDL Cholesterol (Calc) 192 (H) <130 mg/dL (calc)  Comprehensive metabolic panel  Result Value Ref Range   Glucose, Bld 106 (H) 65 - 99 mg/dL   BUN 17 7 - 25 mg/dL   Creat 0.91 0.70 - 1.18 mg/dL   BUN/Creatinine Ratio NOT APPLICABLE 6 - 22 (calc)   Sodium 137 135 - 146 mmol/L   Potassium 4.4 3.5 - 5.3 mmol/L   Chloride 99 98 - 110 mmol/L   CO2 28 20 - 32 mmol/L   Calcium 10.1 8.6 - 10.3 mg/dL   Total Protein 8.1 6.1 -  8.1 g/dL   Albumin 4.7 3.6 - 5.1 g/dL   Globulin 3.4 1.9 - 3.7 g/dL (calc)   AG Ratio 1.4 1.0 - 2.5 (calc)   Total Bilirubin 0.6 0.2 - 1.2 mg/dL   Alkaline phosphatase (APISO) 57 40 - 115 U/L   AST 16 10 - 35 U/L   ALT 14 9 - 46 U/L      Assessment & Plan:   Problem List Items Addressed This Visit      Cardiovascular and Mediastinum   Essential hypertension    Uncontrolled not currently on medication.  Pt has not taken medication in recent years.  Would like to start med.  Plan: 1. START hydrochlorothiazide 12.5 mg once daily. Hold acei since chronic cough. 2. Recheck BP at followup in about 2 months.      Relevant Medications   hydrochlorothiazide (HYDRODIURIL) 12.5 MG tablet   atorvastatin (LIPITOR) 20 MG tablet   Other Relevant Orders   Lipid panel (Completed)   Comprehensive metabolic panel (Completed)     Endocrine   Type 2 diabetes mellitus without complication, without long-term current use of insulin (HCC)    Currently at goal of A1c < 7%.  Pt has diabetes and is not currently on medications.    Plan: 1. START metformin 500 mg once daily w/ breakfast. 2. Encourage increased physical activity. 3. Follow up 3-6 months.      Relevant  Medications   metFORMIN (GLUCOPHAGE) 500 MG tablet   atorvastatin (LIPITOR) 20 MG tablet   Other Relevant Orders   Hemoglobin A1c (Completed)     Other   Chronic right shoulder pain    Pt has chronic shoulder pain w/ limitations in abduction and posterior movement/external rotation.  Pt would like assistance w/ symptoms.  Plan: 1. START using lidocaine ointment topically up to four times daily as needed. - Acetaminophen 650 mg tid prn.  Max 3,000 mg per day. 2. PT referral eval and treat for ROM. 3. Follow up as needed.      Relevant Medications   Lidocaine HCl 3 % LOTN   Other Relevant Orders   Ambulatory referral to Physical Therapy   Screening for hyperlipidemia    Pt w/ DM and HTN.  No prior lipid panel. Needs screening  Plan: 1. START atorvastatin 20 mg w/ DM CV equivalent. 2. Check fasting lipid panel in next 1 week. 3. Follow up as needed.      Relevant Medications   atorvastatin (LIPITOR) 20 MG tablet   Other Relevant Orders   Lipid panel (Completed)   Chronic cough - Primary    Likely COPD vs pneumonia or bronchitis w/ chronic nature of cough.  Pt w/o active symptoms of TB.  Plan: 1. CXray today. - Mild obstructive disease noted. 2. PFT ordered.  Note made that test will be best if in-person interpreter present. 3. START albuterol 1-2 puffs every 6 hours prn. - USE spacer. 4. Followup 2 months. Consider pulmonology if needed.      Relevant Medications   albuterol (PROVENTIL HFA;VENTOLIN HFA) 108 (90 Base) MCG/ACT inhaler   Spacer/Aero-Hold Chamber Bags MISC   Other Relevant Orders   DG Chest 2 View (Completed)   Pulmonary function test    Other Visit Diagnoses    Encounter to establish care       Epic chart reviewed.  Medical history reviewed w/ patient.  No records to obtain.   Flu vaccine need       Pt age > 16 years  old.  Needs High Dose Fluzone.  Administer today.   Relevant Orders   Flu vaccine HIGH DOSE PF (Fluzone High dose) (Completed)    Need for pneumococcal vaccine       Pt w/o prior pneumonia vaccination.  Administer Prevnar 13 today.   Relevant Orders   Pneumococcal conjugate vaccine 13-valent (Completed)      Meds ordered this encounter  Medications  . Lidocaine HCl 3 % LOTN    Sig: Apply 1 application topically every 6 (six) hours as needed.    Dispense:  1 Tube    Refill:  5    Order Specific Question:   Supervising Provider    Answer:   Olin Hauser [[id number]]  . albuterol (PROVENTIL HFA;VENTOLIN HFA) 108 (90 Base) MCG/ACT inhaler    Sig: Inhale 1-2 puffs into the lungs every 6 (six) hours as needed for wheezing or shortness of breath.    Dispense:  1 Inhaler    Refill:  5    Order Specific Question:   Supervising Provider    Answer:   Olin Hauser [[id number]]  . Spacer/Aero-Hold Chamber Bags MISC    Sig: 1 Device by Does not apply route every 6 (six) hours as needed (with inhaler).    Dispense:  1 each    Refill:  1    Order Specific Question:   Supervising Provider    Answer:   Olin Hauser [[id number]]  . hydrochlorothiazide (HYDRODIURIL) 12.5 MG tablet    Sig: Take 1 tablet (12.5 mg total) by mouth daily.    Dispense:  30 tablet    Refill:  5    Order Specific Question:   Supervising Provider    Answer:   Olin Hauser [[id number]]  . metFORMIN (GLUCOPHAGE) 500 MG tablet    Sig: Take 1 tablet (500 mg total) by mouth daily with breakfast.    Dispense:  30 tablet    Refill:  5    Order Specific Question:   Supervising Provider    Answer:   Olin Hauser [[id number]]  . atorvastatin (LIPITOR) 20 MG tablet    Sig: Take 1 tablet (20 mg total) by mouth daily.    Dispense:  30 tablet    Refill:  5    Order Specific Question:   Supervising Provider    Answer:   Olin Hauser [[id number]]     Follow up plan: Return in about 2 months (around 03/12/2017) for Blood pressure, Chronic cough.  [name], DNP, AGPCNP-BC Adult Gerontology Primary Care Nurse  Practitioner [facility] 01/16/2017, 6:13 PM

## 2017-01-10 NOTE — Patient Instructions (Addendum)
Jonathan Willis, Thank you for coming in to clinic today.  1. FOR blood pressure: - START taking hydrochlorothiazide 12.5 mg once daily in the morning.  Can take with or without food. - Blood pressure goal is less than 130/80  2. FOR breathing: - START albuterol inhaler 1-2 puffs up to 4 times per day (about every 6 hours) as needed for cough and wheezing. - We need to schedule a breathing function test to be done with an in person interpreter at the hospital.  This will help [place] know how to treat this disease long term.  3. FOR your neck and shoulder pain: - Start taking acetaminophen or brand name Tylenol extra strength 500mg  tablet 1 to 2 tablets every 8 hours for aches or fever/chills for next few days as needed.  Do not take more than 3,000 mg in 24 hours from all medicines.   - Use heat and ice.  Apply this for 15 minutes at a time 6-8 times per day.   - Muscle rub with lidocaine (Aspercreme or the generic).  Avoid using this with heat and ice to avoid burns.  4. We are screening for diabetes with blood tests today.  We will call you with the results.  Please schedule a follow-up appointment with [name], AGNP. Return in about 2 months (around 03/12/2017) for Blood pressure, Chronic cough.  If you have any other questions or concerns, please feel free to call the clinic or send a message through [place]. You may also schedule an earlier appointment if necessary.  You will receive a survey after today's visit either digitally by e-mail or paper by [employer]. Your experiences and feedback matter to [place].  Please respond so we know how we are doing as we provide care for you.   [name], DNP, AGNP-BC Adult Gerontology Nurse Practitioner [place]    Pulmonary Function Tests Pulmonary function tests (PFTs) are used to measure how well your lungs work, find out what is causing your lung problems, and figure out the best treatment for you. You may have  PFTs:  When you have an illness involving the lungs.  To follow changes in your lung function over time if you have a chronic lung disease.  If you are an Nature conservation officer. This checks the effects of being exposed to chemicals over a long period of time.  To check lung function before having surgery or other procedures.  To check your lungs if you smoke.  To check if prescribed medicines or treatments are helping your lungs.  Your results will be compared to the expected lung function of someone with healthy lungs who is similar to you in:  Age.  Gender.  Height.  Weight.  Race or ethnicity.  This is done to show how your lungs compare to normal lung function (percent predicted). This is how your health care provider knows if your lung function is normal or not. If you have had PFTs done before, your health care provider will compare your current results with past results. This shows if your lung function is better, worse, or the same as before. Tell a health care provider about:  Any allergies you have.  All medicines you are taking, including inhaler or nebulizer medicines, vitamins, herbs, eye drops, creams, and over-the-counter medicines.  Any blood disorders you have.  Any surgeries you have had, especially recent eye surgery, abdominal surgery, or chest surgery. These can make PFTs difficult or unsafe.  Any medical  conditions you have, including chest pain or heart problems, tuberculosis, or respiratory infections such as pneumonia, a cold, or the flu.  Any fear of being in closed spaces (claustrophobia). Some of your tests may be in a closed space. What are the risks? Generally, this is a safe procedure. However, problems may occur, including:  Light-headedness due to over-breathing (hyperventilation).  An asthma attack from deep breathing.  A collapsed lung.  What happens before the procedure?  Take over-the-counter and prescription medicines only as  told by your health care provider. If you take inhaler or nebulizer medicines, ask your health care provider which medicines you should take on the day of your testing. Some inhaler medicines may interfere with PFTs if they are taken shortly before the tests.  Follow your health care provider's instructions on eating and drinking restrictions. This may include avoiding eating large meals and drinking alcohol before the testing.  Do not use any products that contain nicotine or tobacco, such as cigarettes and e-cigarettes. If you need help quitting, ask your health care provider.  Wear comfortable clothing that will not interfere with breathing. What happens during the procedure?  You will be given a soft nose clip to wear. This is done so all of your breaths will go through your mouth instead of your nose.  You will be given a germ-free (sterile) mouthpiece. It will be attached to a machine that measures your breathing (spirometer).  You will be asked to do various breathing maneuvers. The maneuvers will be done by breathing in (inhaling) and breathing out (exhaling). You may be asked to repeat the maneuvers several times before the testing is done.  It is important to follow the instructions exactly to get accurate results. Make sure to blow as hard and as fast as you can when you are told to do so.  You may be given a medicine that makes the small air passages in your lungs larger (bronchodilator) after testing has been done. This medicine will make it easier for you to breathe.  The tests will be repeated after the bronchodilator has taken effect.  You will be monitored carefully during the procedure for faintness, dizziness, trouble breathing, or any other problems. The procedure may vary among health care providers and hospitals. What happens after the procedure?  It is up to you to get your test results. Ask your health care provider, or the department that is doing the tests, when  your results will be ready. After you have received your test results, talk with your health care provider about treatment options, if necessary. Summary  Pulmonary function tests (PFTs) are used to measure how well your lungs work, find out what is causing your lung problems, and figure out the best treatment for you.  Wear comfortable clothing that will not interfere with breathing.  It is up to you to get your test results. After you have received them, talk with your health care provider about treatment options, if necessary. This information is not intended to replace advice given to you by your health care provider. Make sure you discuss any questions you have with your health care provider. Document Released: 11/05/2003 Document Revised: 02/04/2016 Document Reviewed: 02/04/2016 Elsevier Interactive Patient Education  2017 [employer].

## 2017-01-11 ENCOUNTER — Telehealth: Payer: Self-pay

## 2017-01-11 LAB — COMPREHENSIVE METABOLIC PANEL
AG Ratio: 1.4 (calc) (ref 1.0–2.5)
ALT: 14 U/L (ref 9–46)
AST: 16 U/L (ref 10–35)
Albumin: 4.7 g/dL (ref 3.6–5.1)
Alkaline phosphatase (APISO): 57 U/L (ref 40–115)
BUN: 17 mg/dL (ref 7–25)
CO2: 28 mmol/L (ref 20–32)
Calcium: 10.1 mg/dL (ref 8.6–10.3)
Chloride: 99 mmol/L (ref 98–110)
Creat: 0.91 mg/dL (ref 0.70–1.18)
Globulin: 3.4 g/dL (calc) (ref 1.9–3.7)
Glucose, Bld: 106 mg/dL — ABNORMAL HIGH (ref 65–99)
Potassium: 4.4 mmol/L (ref 3.5–5.3)
Sodium: 137 mmol/L (ref 135–146)
Total Bilirubin: 0.6 mg/dL (ref 0.2–1.2)
Total Protein: 8.1 g/dL (ref 6.1–8.1)

## 2017-01-11 LAB — LIPID PANEL
Cholesterol: 236 mg/dL — ABNORMAL HIGH (ref <200)
HDL: 44 mg/dL (ref >40)
LDL Cholesterol (Calc): 160 mg/dL (calc) — ABNORMAL HIGH
Non-HDL Cholesterol (Calc): 192 mg/dL (calc) — ABNORMAL HIGH (ref <130)
Total CHOL/HDL Ratio: 5.4 (calc) — ABNORMAL HIGH (ref <5.0)
Triglycerides: 175 mg/dL — ABNORMAL HIGH (ref <150)

## 2017-01-11 LAB — HEMOGLOBIN A1C
Hgb A1c MFr Bld: 6.5 % of total Hgb — ABNORMAL HIGH (ref <5.7)
Mean Plasma Glucose: 140 (calc)
eAG (mmol/L): 7.7 (calc)

## 2017-01-11 MED ORDER — ATORVASTATIN CALCIUM 20 MG PO TABS: 20.0000 mg | ORAL_TABLET | Freq: Every day | ORAL | 5 refills | Status: DC

## 2017-01-11 MED ORDER — METFORMIN HCL 500 MG PO TABS: 500.0000 mg | ORAL_TABLET | Freq: Every day | ORAL | 5 refills | Status: DC

## 2017-01-11 NOTE — Telephone Encounter (Signed)
Mailed a letter to notify the pt of his lab results.

## 2017-01-11 NOTE — Telephone Encounter (Signed)
The pt son was notified about the chest xray & lab results. He verbalize understanding. I also informed him that we will be mailing out a copy of the results.

## 2017-01-11 NOTE — Telephone Encounter (Signed)
-----   Message from Mikey College, NP sent at 01/11/2017 10:35 AM EDT ----- There are no changes on Xray that show pneumonia or signs of Tuberculosis return.

## 2017-01-16 ENCOUNTER — Encounter: Payer: Self-pay | Admitting: Nurse Practitioner

## 2017-01-16 DIAGNOSIS — J449 Chronic obstructive pulmonary disease, unspecified: Secondary | ICD-10-CM | POA: Insufficient documentation

## 2017-01-16 DIAGNOSIS — M25511 Pain in right shoulder: Secondary | ICD-10-CM

## 2017-01-16 DIAGNOSIS — J432 Centrilobular emphysema: Secondary | ICD-10-CM | POA: Insufficient documentation

## 2017-01-16 DIAGNOSIS — E785 Hyperlipidemia, unspecified: Secondary | ICD-10-CM

## 2017-01-16 DIAGNOSIS — I10 Essential (primary) hypertension: Secondary | ICD-10-CM | POA: Insufficient documentation

## 2017-01-16 DIAGNOSIS — G8929 Other chronic pain: Secondary | ICD-10-CM | POA: Insufficient documentation

## 2017-01-16 DIAGNOSIS — E1169 Type 2 diabetes mellitus with other specified complication: Secondary | ICD-10-CM | POA: Insufficient documentation

## 2017-01-16 DIAGNOSIS — R053 Chronic cough: Secondary | ICD-10-CM

## 2017-01-16 HISTORY — DX: Chronic cough: R05.3

## 2017-01-16 NOTE — Assessment & Plan Note (Signed)
Pt w/ DM and HTN.  No prior lipid panel. Needs screening  Plan: 1. START atorvastatin 20 mg w/ DM CV equivalent. 2. Check fasting lipid panel in next 1 week. 3. Follow up as needed.

## 2017-01-16 NOTE — Assessment & Plan Note (Signed)
Uncontrolled not currently on medication.  Pt has not taken medication in recent years.  Would like to start med.  Plan: 1. START hydrochlorothiazide 12.5 mg once daily. Hold acei since chronic cough. 2. Recheck BP at followup in about 2 months.

## 2017-01-16 NOTE — Assessment & Plan Note (Signed)
Pt has chronic shoulder pain w/ limitations in abduction and posterior movement/external rotation.  Pt would like assistance w/ symptoms.  Plan: 1. START using lidocaine ointment topically up to four times daily as needed. - Acetaminophen 650 mg tid prn.  Max 3,000 mg per day. 2. PT referral eval and treat for ROM. 3. Follow up as needed.

## 2017-01-16 NOTE — Assessment & Plan Note (Addendum)
Likely COPD vs pneumonia or bronchitis w/ chronic nature of cough.  Pt w/o active symptoms of TB.  Plan: 1. CXray today. No signs of active TB. 2. PFT ordered.  Note made that test will be best if in-person interpreter present. 3. START albuterol 1-2 puffs every 6 hours prn. - USE spacer. 4. Followup 2 months. Consider pulmonology if needed.

## 2017-01-16 NOTE — Assessment & Plan Note (Signed)
Currently at goal of A1c < 7%.  Pt has diabetes and is not currently on medications.    Plan: 1. START metformin 500 mg once daily w/ breakfast. 2. Encourage increased physical activity. 3. Follow up 3-6 months.

## 2017-01-21 ENCOUNTER — Inpatient Hospital Stay
Admission: EM | Admit: 2017-01-21 | Discharge: 2017-01-23 | DRG: 190 | Disposition: A | Discharge status: Home / Self Care | Payer: Medicare Other | Attending: Internal Medicine | Admitting: Internal Medicine

## 2017-01-21 ENCOUNTER — Emergency Department: Payer: Medicare Other

## 2017-01-21 DIAGNOSIS — R0602 Shortness of breath: Secondary | ICD-10-CM | POA: Diagnosis not present

## 2017-01-21 DIAGNOSIS — J441 Chronic obstructive pulmonary disease with (acute) exacerbation: Secondary | ICD-10-CM

## 2017-01-21 DIAGNOSIS — J439 Emphysema, unspecified: Principal | ICD-10-CM | POA: Diagnosis present

## 2017-01-21 DIAGNOSIS — R05 Cough: Secondary | ICD-10-CM | POA: Diagnosis not present

## 2017-01-21 DIAGNOSIS — J9602 Acute respiratory failure with hypercapnia: Secondary | ICD-10-CM | POA: Diagnosis present

## 2017-01-21 DIAGNOSIS — J9600 Acute respiratory failure, unspecified whether with hypoxia or hypercapnia: Secondary | ICD-10-CM | POA: Diagnosis not present

## 2017-01-21 DIAGNOSIS — R079 Chest pain, unspecified: Secondary | ICD-10-CM | POA: Diagnosis not present

## 2017-01-21 DIAGNOSIS — Z7984 Long term (current) use of oral hypoglycemic drugs: Secondary | ICD-10-CM

## 2017-01-21 DIAGNOSIS — E119 Type 2 diabetes mellitus without complications: Secondary | ICD-10-CM | POA: Diagnosis present

## 2017-01-21 DIAGNOSIS — E785 Hyperlipidemia, unspecified: Secondary | ICD-10-CM | POA: Diagnosis present

## 2017-01-21 DIAGNOSIS — J9690 Respiratory failure, unspecified, unspecified whether with hypoxia or hypercapnia: Secondary | ICD-10-CM | POA: Diagnosis present

## 2017-01-21 DIAGNOSIS — J9610 Chronic respiratory failure, unspecified whether with hypoxia or hypercapnia: Secondary | ICD-10-CM | POA: Diagnosis present

## 2017-01-21 DIAGNOSIS — Z87891 Personal history of nicotine dependence: Secondary | ICD-10-CM | POA: Diagnosis not present

## 2017-01-21 DIAGNOSIS — J9601 Acute respiratory failure with hypoxia: Secondary | ICD-10-CM | POA: Diagnosis not present

## 2017-01-21 DIAGNOSIS — E876 Hypokalemia: Secondary | ICD-10-CM | POA: Diagnosis present

## 2017-01-21 DIAGNOSIS — I10 Essential (primary) hypertension: Secondary | ICD-10-CM | POA: Diagnosis present

## 2017-01-21 LAB — CBC WITH DIFFERENTIAL/PLATELET
BASOS ABS: 0.1 10*3/uL (ref 0–0.1)
BASOS PCT: 1 %
EOS PCT: 5 %
Eosinophils Absolute: 0.7 10*3/uL (ref 0–0.7)
HCT: 48.1 % (ref 40.0–52.0)
Hemoglobin: 16.5 g/dL (ref 13.0–18.0)
Lymphocytes Relative: 41 %
Lymphs Abs: 6.6 10*3/uL — ABNORMAL HIGH (ref 1.0–3.6)
MCH: 31.4 pg (ref 26.0–34.0)
MCHC: 34.2 g/dL (ref 32.0–36.0)
MCV: 91.7 fL (ref 80.0–100.0)
MONO ABS: 1.1 10*3/uL — AB (ref 0.2–1.0)
Monocytes Relative: 7 %
Neutro Abs: 7.6 10*3/uL — ABNORMAL HIGH (ref 1.4–6.5)
Neutrophils Relative %: 46 %
PLATELETS: 323 10*3/uL (ref 150–440)
RBC: 5.25 MIL/uL (ref 4.40–5.90)
RDW: 13 % (ref 11.5–14.5)
WBC: 16.1 10*3/uL — AB (ref 3.8–10.6)

## 2017-01-21 LAB — BLOOD GAS, ARTERIAL
ACID-BASE EXCESS: 4.9 mmol/L — AB (ref 0.0–2.0)
Bicarbonate: 30.5 mmol/L — ABNORMAL HIGH (ref 20.0–28.0)
DELIVERY SYSTEMS: POSITIVE
FIO2: 0.3
O2 Saturation: 98.5 %
PH ART: 7.42 (ref 7.350–7.450)
Patient temperature: 37
pCO2 arterial: 47 mmHg (ref 32.0–48.0)
pO2, Arterial: 112 mmHg — ABNORMAL HIGH (ref 83.0–108.0)

## 2017-01-21 LAB — GLUCOSE, CAPILLARY
GLUCOSE-CAPILLARY: 224 mg/dL — AB (ref 65–99)
GLUCOSE-CAPILLARY: 219 mg/dL — AB (ref 65–99)

## 2017-01-21 LAB — BASIC METABOLIC PANEL
Anion gap: 13 (ref 5–15)
BUN: 16 mg/dL (ref 6–20)
CHLORIDE: 96 mmol/L — AB (ref 101–111)
CO2: 28 mmol/L (ref 22–32)
CREATININE: 0.88 mg/dL (ref 0.61–1.24)
Calcium: 9.8 mg/dL (ref 8.9–10.3)
GFR calc Af Amer: >60 mL/min (ref >60)
GFR calc non Af Amer: >60 mL/min (ref >60)
GLUCOSE: 144 mg/dL — AB (ref 65–99)
POTASSIUM: 3.3 mmol/L — AB (ref 3.5–5.1)
SODIUM: 137 mmol/L (ref 135–145)

## 2017-01-21 LAB — MRSA PCR SCREENING: MRSA by PCR: NEGATIVE

## 2017-01-21 LAB — TROPONIN I: Troponin I: <0.03 ng/mL (ref <0.03)

## 2017-01-21 LAB — BRAIN NATRIURETIC PEPTIDE: B Natriuretic Peptide: 27 pg/mL (ref 0.0–100.0)

## 2017-01-21 IMAGING — DX DG CHEST 1V
1 series · 1 of 1 positions shown · non-contrast
Comparison: Chest radiograph [DATE]

CLINICAL DATA: Shortness of breath and chest pain, hypoxic. History
of tuberculosis, with chronic cough.

EXAM:
CHEST 1 VIEW

[chest ap]
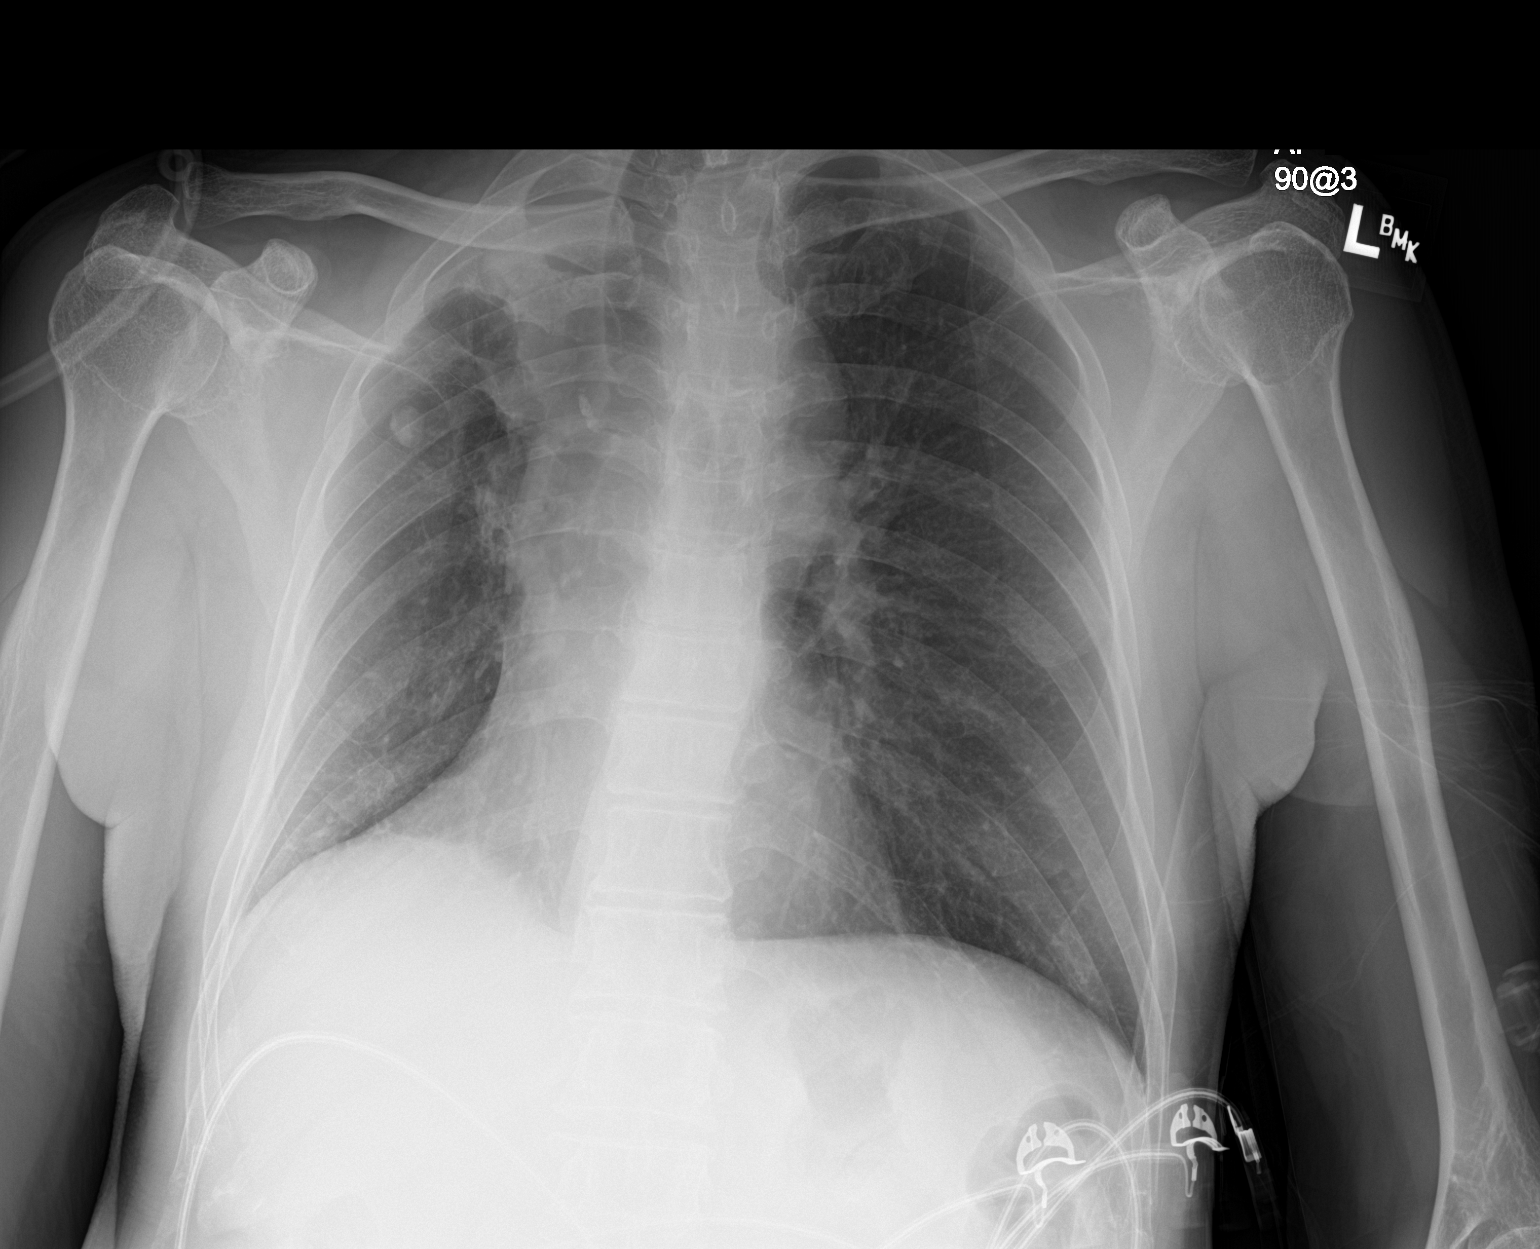

[1 of 1 positions shown; findings below may reference images not displayed]

FINDINGS: Stable RIGHT apical pleural thickening and volume loss with hilar
retraction and calcified granuloma. Hyperinflated LEFT lung without
pleural effusion or focal consolidation. No pneumothorax. Stable
mediastinal shift to the RIGHT, cardiac silhouette is normal in
size.
IMPRESSION: No acute cardiopulmonary process.

Remote granulomatous disease with RIGHT apical scarring.

## 2017-01-21 MED ORDER — SODIUM CHLORIDE 0.9% FLUSH
3.0000 mL | Freq: Two times a day (BID) | INTRAVENOUS | Status: DC
  Administered 2017-01-21 – 2017-01-23 (×5): 3 mL via INTRAVENOUS

## 2017-01-21 MED ORDER — ACETAMINOPHEN 325 MG PO TABS
650.0000 mg | ORAL_TABLET | Freq: Four times a day (QID) | ORAL | Status: DC | PRN
  Administered 2017-01-22: 650 mg via ORAL
  Filled 2017-01-21: qty 2

## 2017-01-21 MED ORDER — ONDANSETRON HCL 4 MG PO TABS: 4.0000 mg | ORAL_TABLET | Freq: Four times a day (QID) | ORAL | Status: DC | PRN

## 2017-01-21 MED ORDER — SENNOSIDES-DOCUSATE SODIUM 8.6-50 MG PO TABS: 1.0000 | ORAL_TABLET | Freq: Every evening | ORAL | Status: DC | PRN

## 2017-01-21 MED ORDER — METHYLPREDNISOLONE SODIUM SUCC 125 MG IJ SOLR
60.0000 mg | Freq: Two times a day (BID) | INTRAMUSCULAR | Status: DC
  Administered 2017-01-21 – 2017-01-23 (×4): 60 mg via INTRAVENOUS
  Filled 2017-01-21 (×4): qty 2

## 2017-01-21 MED ORDER — MAGNESIUM SULFATE 2 GM/50ML IV SOLN
2.0000 g | Freq: Once | INTRAVENOUS | Status: AC
  Administered 2017-01-21: 2 g via INTRAVENOUS
  Filled 2017-01-21: qty 50

## 2017-01-21 MED ORDER — ONDANSETRON HCL 4 MG/2ML IJ SOLN: 4.0000 mg | Freq: Four times a day (QID) | INTRAMUSCULAR | Status: DC | PRN

## 2017-01-21 MED ORDER — BUDESONIDE 0.5 MG/2ML IN SUSP
0.5000 mg | Freq: Two times a day (BID) | RESPIRATORY_TRACT | Status: DC
  Administered 2017-01-21 – 2017-01-23 (×5): 0.5 mg via RESPIRATORY_TRACT
  Filled 2017-01-21 (×5): qty 2

## 2017-01-21 MED ORDER — METHYLPREDNISOLONE SODIUM SUCC 125 MG IJ SOLR
125.0000 mg | Freq: Once | INTRAMUSCULAR | Status: AC
  Administered 2017-01-21: 125 mg via INTRAVENOUS
  Filled 2017-01-21: qty 2

## 2017-01-21 MED ORDER — ENOXAPARIN SODIUM 40 MG/0.4ML ~~LOC~~ SOLN
40.0000 mg | SUBCUTANEOUS | Status: DC
  Administered 2017-01-21 – 2017-01-22 (×2): 40 mg via SUBCUTANEOUS
  Filled 2017-01-21 (×2): qty 0.4

## 2017-01-21 MED ORDER — ATORVASTATIN CALCIUM 20 MG PO TABS
20.0000 mg | ORAL_TABLET | Freq: Every day | ORAL | Status: DC
  Administered 2017-01-21 – 2017-01-23 (×3): 20 mg via ORAL
  Filled 2017-01-21 (×3): qty 1

## 2017-01-21 MED ORDER — IPRATROPIUM-ALBUTEROL 0.5-2.5 (3) MG/3ML IN SOLN
9.0000 mL | Freq: Once | RESPIRATORY_TRACT | Status: AC
  Administered 2017-01-21: 9 mL via RESPIRATORY_TRACT
  Filled 2017-01-21: qty 9

## 2017-01-21 MED ORDER — HYDROCODONE-ACETAMINOPHEN 5-325 MG PO TABS: 1.0000 | ORAL_TABLET | ORAL | Status: DC | PRN

## 2017-01-21 MED ORDER — POTASSIUM CHLORIDE CRYS ER 20 MEQ PO TBCR
40.0000 meq | EXTENDED_RELEASE_TABLET | Freq: Once | ORAL | Status: DC
Start: 2017-01-21 — End: 2017-01-23
  Filled 2017-01-21: qty 2

## 2017-01-21 MED ORDER — METOPROLOL TARTRATE 50 MG PO TABS
50.0000 mg | ORAL_TABLET | Freq: Two times a day (BID) | ORAL | Status: DC
  Administered 2017-01-21 – 2017-01-23 (×4): 50 mg via ORAL
  Filled 2017-01-21 (×4): qty 1

## 2017-01-21 MED ORDER — ACETAMINOPHEN 650 MG RE SUPP: 650.0000 mg | Freq: Four times a day (QID) | RECTAL | Status: DC | PRN

## 2017-01-21 MED ORDER — METFORMIN HCL 500 MG PO TABS
500.0000 mg | ORAL_TABLET | Freq: Every day | ORAL | Status: DC
  Administered 2017-01-21 – 2017-01-23 (×3): 500 mg via ORAL
  Filled 2017-01-21 (×3): qty 1

## 2017-01-21 MED ORDER — METHYLPREDNISOLONE SODIUM SUCC 125 MG IJ SOLR: 125.0000 mg | Freq: Four times a day (QID) | INTRAMUSCULAR | Status: DC

## 2017-01-21 MED ORDER — DEXTROSE 5 % IV SOLN
500.0000 mg | Freq: Once | INTRAVENOUS | Status: AC
  Administered 2017-01-21: 500 mg via INTRAVENOUS
  Filled 2017-01-21: qty 500

## 2017-01-21 MED ORDER — HYDROCHLOROTHIAZIDE 25 MG PO TABS
12.5000 mg | ORAL_TABLET | Freq: Every day | ORAL | Status: DC
  Administered 2017-01-21 – 2017-01-23 (×3): 12.5 mg via ORAL
  Filled 2017-01-21 (×3): qty 1

## 2017-01-21 MED ORDER — SODIUM CHLORIDE 0.9 % IV SOLN: 250.0000 mL | INTRAVENOUS | Status: DC | PRN

## 2017-01-21 MED ORDER — SODIUM CHLORIDE 0.9% FLUSH: 3.0000 mL | INTRAVENOUS | Status: DC | PRN

## 2017-01-21 MED ORDER — CEFTRIAXONE SODIUM IN DEXTROSE 20 MG/ML IV SOLN: 1.0000 g | INTRAVENOUS | Status: DC

## 2017-01-21 MED ORDER — IPRATROPIUM-ALBUTEROL 0.5-2.5 (3) MG/3ML IN SOLN
3.0000 mL | RESPIRATORY_TRACT | Status: DC
  Administered 2017-01-21 (×2): 3 mL via RESPIRATORY_TRACT
  Filled 2017-01-21 (×3): qty 3

## 2017-01-21 MED ORDER — IPRATROPIUM-ALBUTEROL 0.5-2.5 (3) MG/3ML IN SOLN
3.0000 mL | Freq: Four times a day (QID) | RESPIRATORY_TRACT | Status: DC
  Administered 2017-01-21: 3 mL via RESPIRATORY_TRACT
  Filled 2017-01-21: qty 3

## 2017-01-21 MED ORDER — DEXTROSE 5 % IV SOLN
500.0000 mg | INTRAVENOUS | Status: DC
  Administered 2017-01-22 – 2017-01-23 (×2): 500 mg via INTRAVENOUS
  Filled 2017-01-21 (×2): qty 500

## 2017-01-21 NOTE — ED Notes (Signed)
Pt talking with admitting MD at this time.

## 2017-01-21 NOTE — ED Triage Notes (Addendum)
Pt here by POV with SHOB and CP, sent to rm 6 on arrival. Pt tachypnic and was 90% on room air, pt placed on 2L. EDP in rm, resp placing pt on bipap. Pt denies use of oxygen at home, states only inhaler. Pt unsure if he has hx of COPD. [employer] Interpreter used for triage, pt speaks [place].

## 2017-01-21 NOTE — ED Notes (Signed)
[employer] interpreter used for assessment by this RN and EDP.

## 2017-01-21 NOTE — ED Notes (Addendum)
This RN unable to give report at this time, floor states unable to take report at this time.

## 2017-01-21 NOTE — ED Notes (Signed)
Pt tolerating Bipap well at this time, pt states he is able to breath better at this time.

## 2017-01-21 NOTE — H&P (Signed)
[place] [facility] [place] NAME: Jonathan Willis    MR#:  [id number]  DATE OF BIRTH:  06-03-1945  DATE OF ADMISSION:  01/21/2017  PRIMARY CARE PHYSICIAN: Mikey College, NP   REQUESTING/REFERRING PHYSICIAN:   CHIEF COMPLAINT:   Chief Complaint  Patient presents with  . Shortness of Breath    HISTORY OF PRESENT ILLNESS: Jonathan Willis  is a 71 y.o. male with a known history of diabetes mellitus type 2 emphysema, hypertension, chronic cough presented to the emergency room with increased shortness of breath for the last couple of days. Patient has wheezing and had difficulty breathing. He was brought to the emergency room by his daughter-in-law. Patient was put on BiPAP for respiratory distress. Was given IV Solu-Medrol and breathing treatment. No complaints of any chest pain. No fever and chills. Hospitalist service was consulted.  PAST MEDICAL HISTORY:   Past Medical History:  Diagnosis Date  . Diabetes mellitus without complication ([place])   . External hemorrhoid   . 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 History of TB (tuberculosis)   . Hypertension   . Lower GI bleed     PAST SURGICAL HISTORY: Past Surgical History:  Procedure Laterality Date  . NO PAST SURGERIES      SOCIAL HISTORY:  Social History  Substance Use Topics  . Smoking status: Former Smoker    Quit date: 01/11/2007  . Smokeless tobacco: Never Used  . Alcohol use No    FAMILY HISTORY:  Family History  Problem Relation Age of Onset  . COPD Neg Hx   . Diabetes Mellitus II Neg Hx   . Hypertension Neg Hx     DRUG ALLERGIES: No Known Allergies  REVIEW OF SYSTEMS:   CONSTITUTIONAL: No fever, fatigue or weakness.  EYES: No blurred or double vision.  EARS, NOSE, AND THROAT: No tinnitus or ear pain.  RESPIRATORY: Has cough, shortness of breath, wheezing  No hemoptysis.  CARDIOVASCULAR: No chest pain, orthopnea,  edema.  GASTROINTESTINAL: No nausea, vomiting, diarrhea or abdominal pain.  GENITOURINARY: No dysuria, hematuria.  ENDOCRINE: No polyuria, nocturia,  HEMATOLOGY: No anemia, easy bruising or bleeding SKIN: No rash or lesion. MUSCULOSKELETAL: No joint pain or arthritis.   NEUROLOGIC: No tingling, numbness, weakness.  PSYCHIATRY: No anxiety or depression.   MEDICATIONS AT HOME:  Prior to Admission medications   Medication Sig Start Date End Date Taking? Authorizing Provider  acetaminophen (TYLENOL) 650 MG CR tablet Take 650 mg by mouth every 8 (eight) hours as needed for pain.   Yes [provider]  atorvastatin (LIPITOR) 20 MG tablet Take 1 tablet (20 mg total) by mouth daily. 01/11/17  Yes Mikey College, NP  hydrochlorothiazide (HYDRODIURIL) 12.5 MG tablet Take 1 tablet (12.5 mg total) by mouth daily. 01/10/17  Yes Mikey College, NP  metFORMIN (GLUCOPHAGE) 500 MG tablet Take 1 tablet (500 mg total) by mouth daily with breakfast. 01/11/17  Yes Mikey College, NP  albuterol (PROVENTIL HFA;VENTOLIN HFA) 108 (90 Base) MCG/ACT inhaler Inhale 1-2 puffs into the lungs every 6 (six) hours as needed for wheezing or shortness of breath. 01/10/17   Mikey College, NP  Lidocaine HCl 3 % LOTN Apply 1 application topically every 6 (six) hours as needed. Patient not taking: Reported on 01/21/2017 01/10/17   Mikey College, NP  Spacer/Aero-Hold Chamber Bags MISC 1 Device by Does not apply route every 6 (six)  hours as needed (with inhaler). Patient not taking: Reported on 01/21/2017 01/10/17   Mikey College, NP      PHYSICAL EXAMINATION:   VITAL SIGNS: Blood pressure (!) 150/79, pulse (!) 104, temperature 97.9 F (36.6 C), temperature source Axillary, resp. rate (!) 22, weight 49.9 kg (110 lb), SpO2 100 %.  GENERAL:  71 y.o.-year-old patient lying in the bed with no acute distress.  EYES: Pupils equal, round, reactive to light and accommodation. No  scleral icterus. Extraocular muscles intact.  HEENT: Head atraumatic, normocephalic. Oropharynx and nasopharynx clear.  NECK:  Supple, no jugular venous distention. No thyroid enlargement, no tenderness.  LUNGS: Decreased breath sounds bilaterally, bilateral wheezing noted. No use of accessory muscles of respiration.  CARDIOVASCULAR: S1, S2 normal. No murmurs, rubs, or gallops.  ABDOMEN: Soft, nontender, nondistended. Bowel sounds present. No organomegaly or mass.  EXTREMITIES: No pedal edema, cyanosis, or clubbing.  NEUROLOGIC: Cranial nerves II through XII are intact. Muscle strength 5/5 in all extremities. Sensation intact. Gait not checked.  PSYCHIATRIC: The patient is alert and oriented x 3.  SKIN: No obvious rash, lesion, or ulcer.   LABORATORY PANEL:   CBC  Recent Labs Lab 01/21/17 0530  WBC 16.1*  HGB 16.5  HCT 48.1  PLT 323  MCV 91.7  MCH 31.4  MCHC 34.2  RDW 13.0  LYMPHSABS 6.6*  MONOABS 1.1*  EOSABS 0.7  BASOSABS 0.1   ------------------------------------------------------------------------------------------------------------------  Chemistries   Recent Labs Lab 01/21/17 0530  NA 137  K 3.3*  CL 96*  CO2 28  GLUCOSE 144*  BUN 16  CREATININE 0.88  CALCIUM 9.8   ------------------------------------------------------------------------------------------------------------------ estimated creatinine clearance is 51.1 mL/min (by C-G formula based on SCr of 0.88 mg/dL). ------------------------------------------------------------------------------------------------------------------ No results for input(s): TSH, T4TOTAL, T3FREE, THYROIDAB in the last 72 hours.  Invalid input(s): FREET3   Coagulation profile No results for input(s): INR, PROTIME in the last 168 hours. ------------------------------------------------------------------------------------------------------------------- No results for input(s): DDIMER in the last 72  hours. -------------------------------------------------------------------------------------------------------------------  Cardiac Enzymes  Recent Labs Lab 01/21/17 0530  TROPONINI <0.03   ------------------------------------------------------------------------------------------------------------------ Invalid input(s): POCBNP  ---------------------------------------------------------------------------------------------------------------  Urinalysis    Component Value Date/Time   COLORURINE Yellow 02/01/2014 2216   APPEARANCEUR Clear 02/01/2014 2216   LABSPEC 1.023 02/01/2014 2216   PHURINE 5.0 02/01/2014 2216   GLUCOSEU 50 mg/dL 02/01/2014 2216   HGBUR 1+ 02/01/2014 2216   BILIRUBINUR Negative 02/01/2014 2216   KETONESUR Trace 02/01/2014 2216   PROTEINUR 100 mg/dL 02/01/2014 2216   NITRITE Negative 02/01/2014 2216   LEUKOCYTESUR Negative 02/01/2014 2216     RADIOLOGY: Dg Chest 1 View  Result Date: 01/21/2017 CLINICAL DATA:  Shortness of breath and chest pain, hypoxic. History of tuberculosis, with chronic cough. EXAM: CHEST 1 VIEW COMPARISON:  Chest radiograph January 10, 2017 FINDINGS: Stable RIGHT apical pleural thickening and volume loss with hilar retraction and calcified granuloma. Hyperinflated LEFT lung without pleural effusion or focal consolidation. No pneumothorax. Stable mediastinal shift to the RIGHT, cardiac silhouette is normal in size. IMPRESSION: No acute cardiopulmonary process. Remote granulomatous disease with RIGHT apical scarring. Electronically Signed   By: Elon Alas M.D.   On: 01/21/2017 05:59    EKG: Orders placed or performed in visit on 02/01/14  . EKG 12-Lead    IMPRESSION AND PLAN: 71 year old male patient with history of emphysema, diabetes mellitus, hypertension, TB in the past presented to the emergency room with shortness of breath and wheezing.  Admitting diagnosis 1. Acute respiratory failure 2. Acute COPD exacerbation  3.  Hypokalemia 4. Diabetes mellitus 5. Hypertension  Treatment plan Admit patient to stepdown unit Start patient on BiPAP for respiratory distress IV Solu-Medrol 125 MG every 6 hourly Breathing treatments aggressively Start patient on IV Rocephin and IV Zithromax antibiotics Replace potassium  All the records are reviewed and case discussed with ED provider. Management plans discussed with the patient, family and they are in agreement.  CODE STATUS:FULL CODE Code Status History    This patient does not have a recorded code status. Please follow your organizational policy for patients in this situation.       TOTAL CRITICAL CARE TIME TAKING CARE OF THIS PATIENT: 52 minutes.    Saundra Shelling M.D on 01/21/2017 at 6:35 AM  Between 7am to 6pm - Pager - [phone]  After 6pm go to [email or web address] - password [email or web address] [facility]  Office  [phone]  CC: Primary care physician; Mikey College, NP

## 2017-01-21 NOTE — ED Notes (Signed)
Resp called for pt to be placed on bipap per EDP

## 2017-01-21 NOTE — ED Notes (Signed)
Per pt's family, reports pt "was told he might have had TB however they did all the tests and it came back negative." Pt unable to verify diagnosis of TB at this time.

## 2017-01-21 NOTE — Consult Note (Signed)
PATIENT NAME: Jonathan Willis    MR#:  [id number]  DATE OF BIRTH:  1945/06/27  DATE OF ADMISSION:  01/21/2017   REQUESTING/REFERRING PHYSICIAN: PYREDDY  CHIEF COMPLAINT:   Chief Complaint  Patient presents with  . Shortness of Breath    HISTORY OF PRESENT ILLNESS:  Jonathan Willis  is a 71 y.o. male with a known history of diabetes mellitus type 2 emphysema, hypertension, chronic cough presented to the emergency room with increased shortness of breath for the last couple of days.  -Patient has wheezing and had difficulty breathing. -He was brought to the emergency room by his daughter-in-law.  -Patient was put on BiPAP for respiratory distress. Was given IV Solu-Medrol and breathing treatment. - No complaints of any chest pain. No fever and chills.   PAST MEDICAL HISTORY:   Past Medical History:  Diagnosis Date  . Diabetes mellitus without complication ([place])   . External hemorrhoid   . 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 History of TB (tuberculosis)   . Hypertension   . Lower GI bleed     PAST SURGICAL HISTORY:  Past Surgical History:  Procedure Laterality Date  . NO PAST SURGERIES      SOCIAL HISTORY:  Social History  Substance Use Topics  . Smoking status: Former Smoker    Quit date: 01/11/2007  . Smokeless tobacco: Never Used  . Alcohol use No    FAMILY HISTORY:  Family History  Problem Relation Age of Onset  . COPD Neg Hx   . Diabetes Mellitus II Neg Hx   . Hypertension Neg Hx     DRUG ALLERGIES: No Known Allergies  REVIEW OF SYSTEMS:   CONSTITUTIONAL: No fever, fatigue or weakness.  EYES: No blurred or double vision.  EARS, NOSE, AND THROAT: No tinnitus or ear pain.  RESPIRATORY: Has cough, shortness of breath, wheezing  No hemoptysis.  CARDIOVASCULAR: No chest pain, orthopnea, edema.  GASTROINTESTINAL: No nausea, vomiting, diarrhea or abdominal pain.  GENITOURINARY: No dysuria, hematuria.  ENDOCRINE:  No polyuria, nocturia,  HEMATOLOGY: No anemia, easy bruising or bleeding SKIN: No rash or lesion. MUSCULOSKELETAL: No joint pain or arthritis.   NEUROLOGIC: No tingling, numbness, weakness.  PSYCHIATRY: No anxiety or depression.   MEDICATIONS AT HOME:  Prior to Admission medications   Medication Sig Start Date End Date Taking? Authorizing Provider  acetaminophen (TYLENOL) 650 MG CR tablet Take 650 mg by mouth every 8 (eight) hours as needed for pain.   Yes [provider]  atorvastatin (LIPITOR) 20 MG tablet Take 1 tablet (20 mg total) by mouth daily. 01/11/17  Yes Mikey College, NP  hydrochlorothiazide (HYDRODIURIL) 12.5 MG tablet Take 1 tablet (12.5 mg total) by mouth daily. 01/10/17  Yes Mikey College, NP  metFORMIN (GLUCOPHAGE) 500 MG tablet Take 1 tablet (500 mg total) by mouth daily with breakfast. 01/11/17  Yes Mikey College, NP  albuterol (PROVENTIL HFA;VENTOLIN HFA) 108 (90 Base) MCG/ACT inhaler Inhale 1-2 puffs into the lungs every 6 (six) hours as needed for wheezing or shortness of breath. 01/10/17   Mikey College, NP  Lidocaine HCl 3 % LOTN Apply 1 application topically every 6 (six) hours as needed. Patient not taking: Reported on 01/21/2017 01/10/17   Mikey College, NP  Spacer/Aero-Hold Chamber Bags MISC 1 Device by Does not apply route every 6 (six) hours as needed (with inhaler). Patient not taking: Reported on 01/21/2017 01/10/17   Mikey College,  NP      PHYSICAL EXAMINATION:   VITAL SIGNS: Blood pressure (!) 157/74, pulse 95, temperature (!) 96.6 F (35.9 C), temperature source Axillary, resp. rate (!) 26, height 5' (1.524 m), weight 108 lb 3.9 oz (49.1 kg), SpO2 99 %.  GENERAL:  71 y.o.-year-old patient lying in the bed with no acute distress.  EYES: Pupils equal, round, reactive to light and accommodation. No scleral icterus. Extraocular muscles intact.  HEENT: Head atraumatic, normocephalic. Oropharynx and  nasopharynx clear.  NECK:  Supple, no jugular venous distention. No thyroid enlargement, no tenderness.  LUNGS: Decreased breath sounds bilaterally, bilateral wheezing noted. No use of accessory muscles of respiration.  CARDIOVASCULAR: S1, S2 normal. No murmurs, rubs, or gallops.  ABDOMEN: Soft, nontender, nondistended. Bowel sounds present. No organomegaly or mass.  EXTREMITIES: No pedal edema, cyanosis, or clubbing.  NEUROLOGIC: Cranial nerves II through XII are intact. Muscle strength 5/5 in all extremities. Sensation intact. Gait not checked.  PSYCHIATRIC: The patient is alert and oriented x 3.  SKIN: No obvious rash, lesion, or ulcer.   LABORATORY PANEL:   CBC  Recent Labs Lab 01/21/17 0530  WBC 16.1*  HGB 16.5  HCT 48.1  PLT 323  MCV 91.7  MCH 31.4  MCHC 34.2  RDW 13.0  LYMPHSABS 6.6*  MONOABS 1.1*  EOSABS 0.7  BASOSABS 0.1   ------------------------------------------------------------------------------------------------------------------  Chemistries   Recent Labs Lab 01/21/17 0530  NA 137  K 3.3*  CL 96*  CO2 28  GLUCOSE 144*  BUN 16  CREATININE 0.88  CALCIUM 9.8   Cardiac Enzymes  Recent Labs Lab 01/21/17 0530  TROPONINI <0.03    ---------------------------------------------------------------------------------------------------------------  Urinalysis    Component Value Date/Time   COLORURINE Yellow 02/01/2014 2216   APPEARANCEUR Clear 02/01/2014 2216   LABSPEC 1.023 02/01/2014 2216   PHURINE 5.0 02/01/2014 2216   GLUCOSEU 50 mg/dL 02/01/2014 2216   HGBUR 1+ 02/01/2014 2216   BILIRUBINUR Negative 02/01/2014 2216   KETONESUR Trace 02/01/2014 2216   PROTEINUR 100 mg/dL 02/01/2014 2216   NITRITE Negative 02/01/2014 2216   LEUKOCYTESUR Negative 02/01/2014 2216    IMPRESSION AND PLAN: 71 year old male patient with history of emphysema, diabetes mellitus, hypertension, TB in the past presented to the emergency room with shortness of breath  and wheezing.  Admitting diagnosis 1. Acute respiratory failure 2. Acute COPD exacerbation 3. Hypokalemia 4. Diabetes mellitus 5. Hypertension  Treatment plan Admit patient to stepdown unit Start patient on BiPAP for respiratory distress IV Solu-Medrol 125 MG every 6 hourly Breathing treatments aggressively Start patient on IV Rocephin and IV Zithromax antibiotics Replace potassium   Corrin Parker, M.D.  [facility]  Medical Director [place] Director [facility] Cardio-Pulmonary Department

## 2017-01-21 NOTE — ED Notes (Signed)
Second attempt to call report, floor unable to take report at this time.

## 2017-01-21 NOTE — ED Provider Notes (Signed)
[facility] Emergency Department Provider Note  ____________________________________________   First MD Initiated Contact with Patient 01/21/17 0533     (approximate)  I have reviewed the triage vital signs and the nursing notes.   HISTORY  Chief Complaint Shortness of Breath   HPI Shooter Tangen is a 71 y.o. male with a history of chronic cough and questionable COPD who is presenting with 3 days of worsening shortness of breath with chest pain.  He says that he has not had a fever.  Positive for cough but nonproductive.  Says that the chest pain is across his chest and radiating to both arms and through to his back.  The patient was brought directly back from triage for an oxygen saturation of 90% as well as tachycardia to about 110.   Past Medical History:  Diagnosis Date  . Diabetes mellitus without complication ([place])   . External hemorrhoid   . 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 History of TB (tuberculosis)   . Hypertension   . Lower GI bleed     Patient Active Problem List   Diagnosis Date Noted  . Chronic right shoulder pain 01/16/2017  . Essential hypertension 01/16/2017  . Type 2 diabetes mellitus without complication, without long-term current use of insulin ([place]) 01/16/2017  . Screening for hyperlipidemia 01/16/2017  . Chronic cough 01/16/2017    Past Surgical History:  Procedure Laterality Date  . NO PAST SURGERIES      Prior to Admission medications   Medication Sig Start Date End Date Taking? Authorizing Provider  albuterol (PROVENTIL HFA;VENTOLIN HFA) 108 (90 Base) MCG/ACT inhaler Inhale 1-2 puffs into the lungs every 6 (six) hours as needed for wheezing or shortness of breath. 01/10/17   Mikey College, NP  atorvastatin (LIPITOR) 20 MG tablet Take 1 tablet (20 mg total) by mouth daily. 01/11/17   Mikey College, NP  hydrochlorothiazide (HYDRODIURIL) 12.5 MG  tablet Take 1 tablet (12.5 mg total) by mouth daily. 01/10/17   Mikey College, NP  Lidocaine HCl 3 % LOTN Apply 1 application topically every 6 (six) hours as needed. 01/10/17   Mikey College, NP  metFORMIN (GLUCOPHAGE) 500 MG tablet Take 1 tablet (500 mg total) by mouth daily with breakfast. 01/11/17   Mikey College, NP  Spacer/Aero-Hold Chamber Bags MISC 1 Device by Does not apply route every 6 (six) hours as needed (with inhaler). 01/10/17   Mikey College, NP    Allergies Patient has no known allergies.  No family history on file.  Social History Social History  Substance Use Topics  . Smoking status: Former Smoker    Quit date: 01/11/2007  . Smokeless tobacco: Never Used  . Alcohol use No    Review of Systems  Constitutional: No fever/chills Eyes: No visual changes. ENT: No sore throat. Cardiovascular: as above Respiratory: as above Gastrointestinal: No abdominal pain.  No nausea, no vomiting.  No diarrhea.  No constipation. Genitourinary: Negative for dysuria. Musculoskeletal: Negative for back pain. Skin: Negative for rash. Neurological: Negative for headaches, focal weakness or numbness.   ____________________________________________   PHYSICAL EXAM:  VITAL SIGNS: ED Triage Vitals  Enc Vitals Group     BP 01/21/17 0530 (!) 183/98     Pulse Rate 01/21/17 0530 (!) 118     Resp 01/21/17 0530 (!) 25     Temp 01/21/17 0530 97.9 F (36.6 C)     Temp  Source 01/21/17 0530 Axillary     SpO2 01/21/17 0530 99 %     Weight 01/21/17 0531 110 lb (49.9 kg)     Height --      Head Circumference --      Peak Flow --      Pain Score 01/21/17 0515 10     Pain Loc --      Pain Edu? --      Excl. in [place]? --     Constitutional: Alert and oriented. Well appearing and in no acute distress. Eyes: Conjunctivae are normal.  Head: Atraumatic. Nose: No congestion/rhinnorhea. Mouth/Throat: Mucous membranes are moist.  Neck: No stridor.     Cardiovascular: Normal rate, regular rhythm. Grossly normal heart sounds.  Good peripheral circulation with equal bilateral radial as well as dorsalis pedis pulses. Respiratory: Use of accessory muscles with pursed lip breathing.  Wheezing throughout with a prolonged expiratory phase. Gastrointestinal: Soft and nontender. No distention. Musculoskeletal: No lower extremity tenderness nor edema.  No joint effusions. Neurologic:  Normal speech and language. No gross focal neurologic deficits are appreciated. Skin:  Skin is warm, dry and intact. No rash noted. Psychiatric: Mood and affect are normal. Speech and behavior are normal.  ____________________________________________   LABS (all labs ordered are listed, but only abnormal results are displayed)  Labs Reviewed  CBC WITH DIFFERENTIAL/PLATELET - Abnormal; Notable for the following:       Result Value   WBC 16.1 (*)    Neutro Abs 7.6 (*)    Lymphs Abs 6.6 (*)    Monocytes Absolute 1.1 (*)    All other components within normal limits  BASIC METABOLIC PANEL  TROPONIN I  BRAIN NATRIURETIC PEPTIDE   ____________________________________________  EKG  ED ECG REPORT I, Doran Stabler, the attending physician, personally viewed and interpreted this ECG.   Date: 01/21/2017  EKG Time: 517  Rate: 119  Rhythm: sinus tachycardia  Axis: Normal  Intervals:none  ST&T Change: Minimal ST depression which is likely rate related.  ____________________________________________  RADIOLOGY  Hyperinflated without any acute consolidation. ____________________________________________   PROCEDURES  Procedure(s) performed:   Procedures  Critical Care performed: Yes, see critical care note(s) CRITICAL CARE Performed by: Doran Stabler   Total critical care time: 35 minutes  Critical care time was exclusive of separately billable procedures and treating other patients.  Critical care was necessary to treat or prevent  imminent or life-threatening deterioration.  Critical care was time spent personally by me on the following activities: development of treatment plan with patient and/or surrogate as well as nursing, discussions with consultants, evaluation of patient's response to treatment, examination of patient, obtaining history from patient or surrogate, ordering and performing treatments and interventions, ordering and review of laboratory studies, ordering and review of radiographic studies, pulse oximetry and re-evaluation of patient's condition.  ____________________________________________   INITIAL IMPRESSION / ASSESSMENT AND PLAN / ED COURSE  Pertinent labs & imaging results that were available during my care of the patient were reviewed by me and considered in my medical decision making (see chart for details).   Differential includes, but is not limited to, viral syndrome, bronchitis including COPD exacerbation, pneumonia, reactive airway disease including asthma, CHF including exacerbation with or without pulmonary/interstitial edema, pneumothorax, ACS, thoracic trauma, and pulmonary embolism.  As part of my medical decision making, I reviewed the following data within the [place] chart reviewed  ----------------------------------------- 6:03 AM on 01/21/2017 -----------------------------------------  Patient tolerating BiPAP  well.  Likely COPD exacerbation.  Oxygen saturation down to 82% on nasal cannula oxygen at its lowest with the patient is saturating in the 90s at this time.  His breathing rate has decreased and his work of breathing has also decreased.  He subjectively feels better as well.  He is understanding of the need to be admitted to the hospital.  Signed out to Dr. Estanislado Pandy.         ____________________________________________   FINAL CLINICAL IMPRESSION(S) / ED DIAGNOSES  COPD exacerbation    NEW MEDICATIONS STARTED DURING THIS VISIT:  New  Prescriptions   No medications on file     Note:  This document was prepared using Dragon voice recognition software and may include unintentional dictation errors.     Orbie Pyo, MD 01/21/17 [phone]

## 2017-01-21 NOTE — Progress Notes (Signed)
Pt's heart rate noted in 110's sustained, call from tele reports pt increased to 140's when up to BR. Pt is currently on RA and O2 sats at 97% with no distress. MD paged. Order placed for metoprolol 50mg  PO BID to start now. Will continue to monitor.

## 2017-01-22 LAB — GLUCOSE, CAPILLARY
GLUCOSE-CAPILLARY: 134 mg/dL — AB (ref 65–99)
Glucose-Capillary: 179 mg/dL — ABNORMAL HIGH (ref 65–99)
Glucose-Capillary: 120 mg/dL — ABNORMAL HIGH (ref 65–99)
Glucose-Capillary: 232 mg/dL — ABNORMAL HIGH (ref 65–99)

## 2017-01-22 LAB — BASIC METABOLIC PANEL
Anion gap: 13 (ref 5–15)
BUN: 24 mg/dL — ABNORMAL HIGH (ref 6–20)
CHLORIDE: 96 mmol/L — AB (ref 101–111)
CO2: 27 mmol/L (ref 22–32)
Calcium: 10.1 mg/dL (ref 8.9–10.3)
Creatinine, Ser: 1.1 mg/dL (ref 0.61–1.24)
GFR calc Af Amer: >60 mL/min (ref >60)
GFR calc non Af Amer: >60 mL/min (ref >60)
GLUCOSE: 159 mg/dL — AB (ref 65–99)
POTASSIUM: 3.5 mmol/L (ref 3.5–5.1)
Sodium: 136 mmol/L (ref 135–145)

## 2017-01-22 LAB — CBC
HEMATOCRIT: 44.1 % (ref 40.0–52.0)
HEMOGLOBIN: 15.1 g/dL (ref 13.0–18.0)
MCH: 31.6 pg (ref 26.0–34.0)
MCHC: 34.2 g/dL (ref 32.0–36.0)
MCV: 92.4 fL (ref 80.0–100.0)
Platelets: 335 10*3/uL (ref 150–440)
RBC: 4.78 MIL/uL (ref 4.40–5.90)
RDW: 13.4 % (ref 11.5–14.5)
WBC: 10.9 10*3/uL — ABNORMAL HIGH (ref 3.8–10.6)

## 2017-01-22 MED ORDER — IPRATROPIUM-ALBUTEROL 0.5-2.5 (3) MG/3ML IN SOLN: 3.0000 mL | RESPIRATORY_TRACT | Status: DC | PRN

## 2017-01-22 MED ORDER — INSULIN ASPART 100 UNIT/ML ~~LOC~~ SOLN
0.0000 [IU] | Freq: Every day | SUBCUTANEOUS | Status: DC
  Administered 2017-01-22: 2 [IU] via SUBCUTANEOUS

## 2017-01-22 MED ORDER — IPRATROPIUM-ALBUTEROL 0.5-2.5 (3) MG/3ML IN SOLN
3.0000 mL | Freq: Four times a day (QID) | RESPIRATORY_TRACT | Status: DC
  Administered 2017-01-22 – 2017-01-23 (×6): 3 mL via RESPIRATORY_TRACT
  Filled 2017-01-22 (×6): qty 3

## 2017-01-22 MED ORDER — INSULIN ASPART 100 UNIT/ML ~~LOC~~ SOLN
0.0000 [IU] | Freq: Three times a day (TID) | SUBCUTANEOUS | Status: DC
  Administered 2017-01-22 – 2017-01-23 (×3): 2 [IU] via SUBCUTANEOUS
  Filled 2017-01-22 (×2): qty 1

## 2017-01-22 NOTE — Progress Notes (Signed)
[facility] [place] NAME: Jonathan Willis    MR#:  [id number]  DATE OF BIRTH:  [birth year]  SUBJECTIVE:  CHIEF COMPLAINT:   Chief Complaint  Patient presents with  . Shortness of Breath   Shortness of breath is improved but still present.  Still wheezing.  Dry cough.  Off BiPAP.  Transferred out of ICU.  REVIEW OF SYSTEMS:    Review of Systems  Constitutional: Positive for malaise/fatigue. Negative for chills and fever.  HENT: Negative for sore throat.   Eyes: Negative for blurred vision, double vision and pain.  Respiratory: Positive for cough, shortness of breath and wheezing. Negative for hemoptysis.   Cardiovascular: Negative for chest pain, palpitations, orthopnea and leg swelling.  Gastrointestinal: Negative for abdominal pain, constipation, diarrhea, heartburn, nausea and vomiting.  Genitourinary: Negative for dysuria and hematuria.  Musculoskeletal: Negative for back pain and joint pain.  Skin: Negative for rash.  Neurological: Positive for weakness. Negative for sensory change, speech change, focal weakness and headaches.  Endo/Heme/Allergies: Does not bruise/bleed easily.  Psychiatric/Behavioral: Negative for depression. The patient is not nervous/anxious.     DRUG ALLERGIES:  No Known Allergies  VITALS:  Blood pressure 129/72, pulse 98, temperature 97.9 F (36.6 C), temperature source Oral, resp. rate 18, height 5' (1.524 m), weight 49.1 kg (108 lb 3.9 oz), SpO2 97 %.  PHYSICAL EXAMINATION:   Physical Exam  GENERAL:  71 y.o.-year-old patient lying in the bed with no acute distress.  EYES: Pupils equal, round, reactive to light and accommodation. No scleral icterus. Extraocular muscles intact.  HEENT: Head atraumatic, normocephalic. Oropharynx and nasopharynx clear.  NECK:  Supple, no jugular venous distention. No thyroid enlargement, no tenderness.  LUNGS: Bilateral wheezing decreased air entry CARDIOVASCULAR: S1, S2 normal.  No murmurs, rubs, or gallops.  ABDOMEN: Soft, nontender, nondistended. Bowel sounds present. No organomegaly or mass.  EXTREMITIES: No cyanosis, clubbing or edema b/l.    NEUROLOGIC: Cranial nerves II through XII are intact. No focal Motor or sensory deficits b/l.   PSYCHIATRIC: The patient is alert and oriented x 3.  SKIN: No obvious rash, lesion, or ulcer.   LABORATORY PANEL:   CBC  Recent Labs Lab 01/22/17 0323  WBC 10.9*  HGB 15.1  HCT 44.1  PLT 335   ------------------------------------------------------------------------------------------------------------------ Chemistries   Recent Labs Lab 01/22/17 0323  NA 136  K 3.5  CL 96*  CO2 27  GLUCOSE 159*  BUN 24*  CREATININE 1.10  CALCIUM 10.1   ------------------------------------------------------------------------------------------------------------------  Cardiac Enzymes  Recent Labs Lab 01/21/17 0530  TROPONINI <0.03   ------------------------------------------------------------------------------------------------------------------  RADIOLOGY:  Dg Chest 1 View  Result Date: 01/21/2017 CLINICAL DATA:  Shortness of breath and chest pain, hypoxic. History of tuberculosis, with chronic cough. EXAM: CHEST 1 VIEW COMPARISON:  Chest radiograph January 10, 2017 FINDINGS: Stable RIGHT apical pleural thickening and volume loss with hilar retraction and calcified granuloma. Hyperinflated LEFT lung without pleural effusion or focal consolidation. No pneumothorax. Stable mediastinal shift to the RIGHT, cardiac silhouette is normal in size. IMPRESSION: No acute cardiopulmonary process. Remote granulomatous disease with RIGHT apical scarring. Electronically Signed   By: Elon Alas M.D.   On: 01/21/2017 05:59     ASSESSMENT AND PLAN:   * Acute COPD exacerbation with acute hypercarbic resp failure -IV steroids, Antibiotics - Scheduled Nebulizers - Inhalers -Wean O2 as tolerated - Consult pulmonary Off bipap  today  Likely d/c in AM. Still has wheezing and SOB  * DM  SSI  * HTN Continue home meds  All the records are reviewed and case discussed with Care Management/Social Workerr. Management plans discussed with the patient, family and they are in agreement.  CODE STATUS: FULL CODE  DVT Prophylaxis: SCDs  TOTAL TIME TAKING CARE OF THIS PATIENT: 30 minutes.   POSSIBLE D/C IN 1-2 DAYS, DEPENDING ON CLINICAL CONDITION.  Hillary Bow R M.D on 01/22/2017 at 11:56 AM  Between 7am to 6pm - Pager - [phone]  After 6pm go to [email or web address] - password [email or web address] [facility]  Office  [phone]  CC: Primary care physician; Mikey College, NP  Note: This dictation was prepared with Dragon dictation along with smaller phrase technology. Any transcriptional errors that result from this process are unintentional.

## 2017-01-22 NOTE — Progress Notes (Signed)
Pt alert and resting in room. Complaints of headache this AM. PRN Tylenol given. No other complaints at this time. Pt on room air. IV Saline locked. Will continue to monitor.

## 2017-01-23 DIAGNOSIS — J441 Chronic obstructive pulmonary disease with (acute) exacerbation: Secondary | ICD-10-CM

## 2017-01-23 LAB — GLUCOSE, CAPILLARY
GLUCOSE-CAPILLARY: 274 mg/dL — AB (ref 65–99)
Glucose-Capillary: 123 mg/dL — ABNORMAL HIGH (ref 65–99)

## 2017-01-23 MED ORDER — AZITHROMYCIN 250 MG PO TABS: ORAL_TABLET | ORAL | 0 refills | Status: AC

## 2017-01-23 MED ORDER — PREDNISONE 10 MG (21) PO TBPK: ORAL_TABLET | ORAL | 0 refills | Status: DC

## 2017-01-23 MED ORDER — ALBUTEROL SULFATE HFA 108 (90 BASE) MCG/ACT IN AERS: 2.0000 | INHALATION_SPRAY | Freq: Four times a day (QID) | RESPIRATORY_TRACT | 2 refills | Status: DC | PRN

## 2017-01-23 MED ORDER — METOPROLOL TARTRATE 50 MG PO TABS: 50.0000 mg | ORAL_TABLET | Freq: Two times a day (BID) | ORAL | 0 refills | Status: DC

## 2017-01-23 NOTE — Discharge Summary (Signed)
Jonathan Willis, is a 71 y.o. male  DOB 17-Apr-1945  MRN [id number].  Admission date:  01/21/2017  Admitting Physician  Saundra Shelling, MD  Discharge Date:  01/23/2017   Primary MD  Mikey College, NP  Recommendations for primary care physician for things to follow:    fFollow With PCP in 1 week  Admission Diagnosis  COPD exacerbation [facility]) [J44.1]   Discharge Diagnosis  COPD exacerbation ([place]) [J44.1]    Active Problems:   Respiratory failure [facility])      Past Medical History:  Diagnosis Date  . Diabetes mellitus without complication ([place])   . External hemorrhoid   . 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 History of TB (tuberculosis)   . Hypertension   . Lower GI bleed     Past Surgical History:  Procedure Laterality Date  . NO PAST SURGERIES         History of present illness and  Hospital Course:     Kindly see H&P for history of present illness and admission details, please review complete Labs, Consult reports and Test reports for all details in brief  HPI  from the history and physical done on the day of admission .  Memory speaking 71 year old male with diabetes mellitus type 2, emphysema, hypertension admitted because of shortness of breath, wheezing for couple of days.  Admitted  for COPD exacerbation.   Hospital Course  #1 acute respiratory failure secondary to COPD exacerbation.  Received a BiPAP because of shortness of breath, tachycardia, heart rate up to 118 bpm when he came to ER.  Admitted to ICU for BiPAP support.  Patient received IV Solu-Medrol 125 mg every 6 hours initially, changed the dose, continued on bronchodilators, started on empiric antibiotics with Rocephin, Zithromax.  Seen by incised Dr. Patricia Pesa.  X-ray did not show any  pneumonia.  Weaned off the BiPAP, transferred to floor. Today patient feels much better, and wants to go home.  Uses a language line and spoke with him with help of [place] interpreter. discharged home with tapering prednisone, antibiotics, albuterol inhaler.  O2 saturations are 98% on room air.   #2 essential hypertension: Controlled 3.  Diabetes mellitus type 2: Continue metformin 4.  Hyperlipidemia: Continue statins. Discharge Condition: stable   Follow UP  Follow-up Information    Mikey College, NP On 01/30/2017.   Specialty:  Nurse Practitioner Why:  @ 1:40 pm Contact information: [address] [place] [address] [phone]             Discharge Instructions  and  Discharge Medications      Allergies as of 01/23/2017   No Known Allergies     Medication List    STOP taking these medications   acetaminophen 650 MG CR tablet Commonly known as:  TYLENOL     TAKE these medications   albuterol 108 (90 Base) MCG/ACT inhaler Commonly known as:  PROVENTIL HFA;VENTOLIN HFA Inhale 2 puffs into the lungs every 6 (six) hours as needed for wheezing or shortness of breath. What changed:  how much to take   atorvastatin 20 MG tablet Commonly known as:  LIPITOR Take 1 tablet (20 mg total) by mouth daily.   azithromycin 250 MG tablet Commonly known as:  ZITHROMAX Z-PAK Take 2 tablets (500 mg) on  Day 1,  followed by 1 tablet (250 mg) once daily on Days 2 through 5.   hydrochlorothiazide 12.5 MG  tablet Commonly known as:  HYDRODIURIL Take 1 tablet (12.5 mg total) by mouth daily.   Lidocaine HCl 3 % Lotn Apply 1 application topically every 6 (six) hours as needed.   metFORMIN 500 MG tablet Commonly known as:  GLUCOPHAGE Take 1 tablet (500 mg total) by mouth daily with breakfast.   metoprolol tartrate 50 MG tablet Commonly known as:  LOPRESSOR Take 1 tablet (50 mg total) by mouth 2 (two) times daily.   predniSONE 10 MG (21) Tbpk tablet Commonly known  as:  STERAPRED UNI-PAK 21 TAB taper by 10 mg daily   Spacer/Aero-Hold Chamber Bags Misc 1 Device by Does not apply route every 6 (six) hours as needed (with inhaler).         Diet and Activity recommendation: See Discharge Instructions above   Consults obtained -intensivist   Major procedures and Radiology Reports - PLEASE review detailed and final reports for all details, in brief -      Dg Chest 1 View  Result Date: 01/21/2017 CLINICAL DATA:  Shortness of breath and chest pain, hypoxic. History of tuberculosis, with chronic cough. EXAM: CHEST 1 VIEW COMPARISON:  Chest radiograph January 10, 2017 FINDINGS: Stable RIGHT apical pleural thickening and volume loss with hilar retraction and calcified granuloma. Hyperinflated LEFT lung without pleural effusion or focal consolidation. No pneumothorax. Stable mediastinal shift to the RIGHT, cardiac silhouette is normal in size. IMPRESSION: No acute cardiopulmonary process. Remote granulomatous disease with RIGHT apical scarring. Electronically Signed   By: Elon Alas M.D.   On: 01/21/2017 05:59   Dg Chest 2 View  Result Date: 01/10/2017 CLINICAL DATA:  Dry cough for several years EXAM: CHEST  2 VIEW COMPARISON:  02/06/2014 FINDINGS: Cardiac shadow is stable. The left lung is clear. The right lung demonstrates considerable scarring in the apex stable from the prior exam. Calcified granuloma is noted as well which is stable. These changes are similar to that seen on the prior chest CT of 02/01/2014. No new focal abnormality is noted. Stable compression deformity in the upper lumbar spine is noted. IMPRESSION: Stable changes in the right apex from 2015 likely related to chronic granulomatous disease no new focal abnormality is seen. Electronically Signed   By: Inez Catalina M.D.   On: 01/10/2017 14:23    Micro Results    Recent Results (from the past 240 hour(s))  MRSA PCR Screening     Status: None   Collection Time: 01/21/17  7:57  AM  Result Value Ref Range Status   MRSA by PCR NEGATIVE NEGATIVE Final    Comment:        The GeneXpert MRSA Assay (FDA approved for NASAL specimens only), is one component of a comprehensive MRSA colonization surveillance program. It is not intended to diagnose MRSA infection nor to guide or monitor treatment for MRSA infections.        Today   Subjective:   Jonathan Willis today has no headache,no chest abdominal pain,no new weakness tingling or numbness, feels much better wants to go home today.   Objective:   Blood pressure 130/69, pulse 70, temperature 97.7 F (36.5 C), temperature source Oral, resp. rate 17, height 5' (1.524 m), weight 49.1 kg (108 lb 3.9 oz), SpO2 98 %.   Intake/Output Summary (Last 24 hours) at 01/23/17 1228 Last data filed at 01/23/17 0947  Gross per 24 hour  Intake              970 ml  Output  0 ml  Net              970 ml    Exam Awake Alert, Oriented x 3, No new F.N deficits, Normal affect [place].AT,PERRAL Supple Neck,No JVD, No cervical lymphadenopathy appriciated.  Symmetrical Chest wall movement, Good air movement bilaterally, CTAB RRR,No Gallops,Rubs or new Murmurs, No Parasternal Heave +ve B.Sounds, Abd Soft, Non tender, No organomegaly appriciated, No rebound -guarding or rigidity. No Cyanosis, Clubbing or edema, No new Rash or bruise  Data Review   CBC w Diff: Lab Results  Component Value Date   WBC 10.9 (H) 01/22/2017   HGB 15.1 01/22/2017   HGB 14.1 02/06/2014   HCT 44.1 01/22/2017   HCT 42.3 02/06/2014   PLT 335 01/22/2017   PLT 336 02/06/2014   LYMPHOPCT 41 01/21/2017   LYMPHOPCT 24.4 02/06/2014   MONOPCT 7 01/21/2017   MONOPCT 7.6 02/06/2014   EOSPCT 5 01/21/2017   EOSPCT 0.1 02/06/2014   BASOPCT 1 01/21/2017   BASOPCT 0.2 02/06/2014    CMP: Lab Results  Component Value Date   NA 136 01/22/2017   NA 136 02/04/2014   K 3.5 01/22/2017   K 4.1 02/04/2014   CL 96 (L) 01/22/2017   CL 102 02/04/2014    CO2 27 01/22/2017   CO2 25 02/04/2014   BUN 24 (H) 01/22/2017   BUN 18 02/04/2014   CREATININE 1.10 01/22/2017   CREATININE 0.91 01/10/2017   PROT 8.1 01/10/2017   BILITOT 0.6 01/10/2017   BILITOT 0.5 02/01/2014   AST 16 01/10/2017   ALT 14 01/10/2017  .   Total Time in preparing paper work, data evaluation and todays exam - 78 minutes  Jonathan Willis M.D on 01/23/2017 at 12:28 PM    Note: This dictation was prepared with Dragon dictation along with smaller phrase technology. Any transcriptional errors that result from this process are unintentional.

## 2017-01-23 NOTE — Progress Notes (Signed)
* [facility]     Assessment and Plan:  IMPRESSION AND PLAN: 71 year old male patient with history of emphysema, diabetes mellitus, hypertension, TB in the past now admitted with AECOPD.   1. Acute respiratory failure now on RA with sat 98%, doing better on IV steroids.  2. Acute COPD exacerbation-- as above, doing better. Abs eosiniphil count = 700 01/21/17, suspect ACOS or element of asthma.   Recommend steroid taper, and the patient should be maintained on a inhaled steroid upon discharge.  Date: 01/23/2017  MRN# [id number] Jonathan Willis 27-May-1945   Jonathan Willis is a 71 y.o. old male seen in follow up for chief complaint of  Chief Complaint  Patient presents with  . Shortness of Breath     HPI:   Patient is resting comfortably, he has no complaints today. Abs eosiniphil count = 700 01/21/17   Medication:    Current Facility-Administered Medications:  .  0.9 %  sodium chloride infusion, 250 mL, Intravenous, PRN, Pyreddy, Pavan, MD .  acetaminophen (TYLENOL) tablet 650 mg, 650 mg, Oral, Q6H PRN, 650 mg at 01/22/17 0954 **OR** acetaminophen (TYLENOL) suppository 650 mg, 650 mg, Rectal, Q6H PRN, Pyreddy, Pavan, MD .  atorvastatin (LIPITOR) tablet 20 mg, 20 mg, Oral, Daily, Pyreddy, Pavan, MD, 20 mg at 01/23/17 0955 .  azithromycin (ZITHROMAX) 500 mg in dextrose 5 % 250 mL IVPB, 500 mg, Intravenous, Q24H, Pyreddy, Reatha Harps, MD, Stopped at 01/23/17 1055 .  budesonide (PULMICORT) nebulizer solution 0.5 mg, 0.5 mg, Nebulization, BID, Kasa, Kurian, MD, 0.5 mg at 01/23/17 5329 .  enoxaparin (LOVENOX) injection 40 mg, 40 mg, Subcutaneous, Q24H, Pyreddy, Pavan, MD, 40 mg at 01/22/17 2054 .  hydrochlorothiazide (HYDRODIURIL) tablet 12.5 mg, 12.5 mg, Oral, Daily, Pyreddy, Pavan, MD, 12.5 mg at 01/23/17 0955 .  HYDROcodone-acetaminophen (NORCO/VICODIN) 5-325 MG per tablet 1-2 tablet, 1-2 tablet, Oral, Q4H PRN, Pyreddy, Pavan, MD .  insulin aspart (novoLOG) injection 0-15 Units,  0-15 Units, Subcutaneous, TID WC, Hillary Bow, MD, 2 Units at 01/23/17 1235 .  insulin aspart (novoLOG) injection 0-5 Units, 0-5 Units, Subcutaneous, QHS, Hillary Bow, MD, 2 Units at 01/22/17 2120 .  ipratropium-albuterol (DUONEB) 0.5-2.5 (3) MG/3ML nebulizer solution 3 mL, 3 mL, Nebulization, QID, Sudini, Srikar, MD, 3 mL at 01/23/17 1127 .  ipratropium-albuterol (DUONEB) 0.5-2.5 (3) MG/3ML nebulizer solution 3 mL, 3 mL, Nebulization, Q4H PRN, Sudini, Srikar, MD .  metFORMIN (GLUCOPHAGE) tablet 500 mg, 500 mg, Oral, Q breakfast, Pyreddy, Pavan, MD, 500 mg at 01/23/17 0825 .  methylPREDNISolone sodium succinate (SOLU-MEDROL) 125 mg/2 mL injection 60 mg, 60 mg, Intravenous, Q12H, Kasa, Kurian, MD, 60 mg at 01/23/17 0630 .  metoprolol tartrate (LOPRESSOR) tablet 50 mg, 50 mg, Oral, BID, Sudini, Srikar, MD, 50 mg at 01/23/17 0955 .  ondansetron (ZOFRAN) tablet 4 mg, 4 mg, Oral, Q6H PRN **OR** ondansetron (ZOFRAN) injection 4 mg, 4 mg, Intravenous, Q6H PRN, Pyreddy, Pavan, MD .  potassium chloride SA (K-DUR,KLOR-CON) CR tablet 40 mEq, 40 mEq, Oral, Once, Pyreddy, Pavan, MD .  senna-docusate (Senokot-S) tablet 1 tablet, 1 tablet, Oral, QHS PRN, Pyreddy, Pavan, MD .  sodium chloride flush (NS) 0.9 % injection 3 mL, 3 mL, Intravenous, Q12H, Pyreddy, Pavan, MD, 3 mL at 01/23/17 0956 .  sodium chloride flush (NS) 0.9 % injection 3 mL, 3 mL, Intravenous, PRN, Saundra Shelling, MD   Allergies:  Patient has no known allergies.  Review of Systems: Gen:  Denies  fever, sweats. HEENT: Denies blurred vision. Cvc:  No dizziness, chest  pain or heaviness Resp:   Denies cough or sputum porduction. Gi: Denies swallowing difficulty, stomach pain. constipation, bowel incontinence Gu:  Denies bladder incontinence, burning urine Ext:   No Joint pain, stiffness. Skin: No skin rash, easy bruising. Endoc:  No polyuria, polydipsia. Psych: No depression, insomnia. Other:  All other systems were reviewed and found  to be negative other than what is mentioned in the HPI.   Physical Examination:   VS: BP 130/69 (BP Location: Right Arm)   Pulse 70   Temp 97.7 F (36.5 C) (Oral)   Resp 17   Ht 5' (1.524 m)   Wt 108 lb 3.9 oz (49.1 kg)   SpO2 98%   BMI 21.14 kg/m   General Appearance: No distress  Neuro:without focal findings,  speech normal,  HEENT: PERRLA, EOM intact. Pulmonary: normal breath sounds, No wheezing.   CardiovascularNormal S1,S2.  No m/r/g.   Abdomen: Benign, Soft, non-tender. Renal:  No costovertebral tenderness  GU:  Not performed at this time. Endoc: No evident thyromegaly, no signs of acromegaly. Skin:   warm, no rash. Extremities: normal, no cyanosis, clubbing.   LABORATORY PANEL:   CBC  Recent Labs Lab 01/22/17 0323  WBC 10.9*  HGB 15.1  HCT 44.1  PLT 335   ------------------------------------------------------------------------------------------------------------------  Chemistries   Recent Labs Lab 01/22/17 0323  NA 136  K 3.5  CL 96*  CO2 27  GLUCOSE 159*  BUN 24*  CREATININE 1.10  CALCIUM 10.1   ------------------------------------------------------------------------------------------------------------------  Cardiac Enzymes  Recent Labs Lab 01/21/17 0530  TROPONINI <0.03   ------------------------------------------------------------  RADIOLOGY:   No results found for this or any previous visit. Results for orders placed during the hospital encounter of 01/10/17  DG Chest 2 View   Narrative CLINICAL DATA:  Dry cough for several years  EXAM: CHEST  2 VIEW  COMPARISON:  02/06/2014  FINDINGS: Cardiac shadow is stable. The left lung is clear. The right lung demonstrates considerable scarring in the apex stable from the prior exam. Calcified granuloma is noted as well which is stable. These changes are similar to that seen on the prior chest CT of 02/01/2014. No new focal abnormality is noted. Stable compression deformity in the  upper lumbar spine is noted.  IMPRESSION: Stable changes in the right apex from 2015 likely related to chronic granulomatous disease no new focal abnormality is seen.   Electronically Signed   By: Inez Catalina M.D.   On: 01/10/2017 14:23    ------------------------------------------------------------------------------------------------------------------  Thank  you for allowing [facility] to assist in the care of your patient. Our recommendations are noted above.  Please contact us if we can be of further service.   Marda Stalker, MD.  [facility] Office Number: [phone]  Patricia Pesa, M.D.  Merton Border, M.D  01/23/2017

## 2017-01-23 NOTE — Progress Notes (Signed)
DISCHARGE NOTE:  Pts daughter at the bedside, discharge instructions given to pt and daughter ([place] copy given to pt). They verbalized understanding. Pt wheeled out by volunteer.

## 2017-01-25 ENCOUNTER — Telehealth: Payer: Self-pay

## 2017-01-25 NOTE — Telephone Encounter (Signed)
Used interpretor number: [phone] - Interpretor ID [phone] (vietnamese)  I have made the 1st attempt to contact the patient or family member in charge, in order to follow up from recently being discharged from the hospital. I left a message on voicemail but I will make another attempt at a different time.

## 2017-01-30 ENCOUNTER — Inpatient Hospital Stay: Payer: Self-pay | Admitting: Nurse Practitioner

## 2017-02-02 ENCOUNTER — Ambulatory Visit (INDEPENDENT_AMBULATORY_CARE_PROVIDER_SITE_OTHER): Payer: Medicare Other | Admitting: Nurse Practitioner

## 2017-02-02 ENCOUNTER — Encounter: Payer: Self-pay | Admitting: Nurse Practitioner

## 2017-02-02 VITALS — BP 132/58 | HR 59 | Temp 98.0°F | Resp 18 | Ht 60.0 in | Wt 110.4 lb

## 2017-02-02 DIAGNOSIS — J431 Panlobular emphysema: Secondary | ICD-10-CM | POA: Diagnosis not present

## 2017-02-02 DIAGNOSIS — I10 Essential (primary) hypertension: Secondary | ICD-10-CM

## 2017-02-02 DIAGNOSIS — Z9289 Personal history of other medical treatment: Secondary | ICD-10-CM

## 2017-02-02 MED ORDER — TIOTROPIUM BROMIDE MONOHYDRATE 18 MCG IN CAPS: 18.0000 ug | ORAL_CAPSULE | Freq: Every day | RESPIRATORY_TRACT | 5 refills | Status: DC

## 2017-02-02 MED ORDER — LOSARTAN POTASSIUM 50 MG PO TABS: 50.0000 mg | ORAL_TABLET | Freq: Every day | ORAL | 2 refills | Status: DC

## 2017-02-02 MED ORDER — BUDESONIDE-FORMOTEROL FUMARATE 80-4.5 MCG/ACT IN AERO: 2.0000 | INHALATION_SPRAY | Freq: Two times a day (BID) | RESPIRATORY_TRACT | 3 refills | Status: DC

## 2017-02-02 MED ORDER — UMECLIDINIUM BROMIDE 62.5 MCG/INH IN AEPB: 1.0000 | INHALATION_SPRAY | Freq: Every day | RESPIRATORY_TRACT | 6 refills | Status: DC

## 2017-02-02 NOTE — Patient Instructions (Addendum)
Jonathan Willis, Thank you for coming in to clinic today.  1. STOP taking metoprolol for your blood pressure.  It is not helping blood pressure as much as we would like and it is making your heart rate slow.  2. - START taking spiriva once daily. - START taking symbicort inhaler 2 puffs twice daily. - CONTINUE using albuterol (the one he already has) as needed for wheezing, shortness of breath, chest tightness that does not resolve after 5 minutes rest.   3.  We still need pulmonary function testing.  When they call you to schedule, let them know which inhalers you take and ask how may days he needs to stop these before the test.  Please schedule a follow-up appointment with [name], AGNP.  Return for COPD/emphysema.  If you have any other questions or concerns, please feel free to call the clinic or send a message through [facility]. You may also schedule an earlier appointment if necessary.  You will receive a survey after today's visit either digitally by e-mail or paper by [employer]. Your experiences and feedback matter to [place].  Please respond so we know how we are doing as we provide care for you.   [name], DNP, AGNP-BC Adult Gerontology Nurse Practitioner [place]

## 2017-02-02 NOTE — Progress Notes (Signed)
Subjective:    Patient ID: Jonathan Willis, male    DOB: Mar 16, 1946, 71 y.o.   MRN: [id number]  Jonathan Willis is a 71 y.o. male presenting on 02/02/2017 for Hospitalization Follow-up ( emphysema,COPD )  Telephone interpreter language: [place] Name: Jonathan Willis ZJ#:[id number]  [facility]IS[facility] Hospital/Location: [place] Date of Admission: 01/21/2017  Date of Discharge: 01/23/17 Transitions of care telephone call: 01/25/17 [facility]  Reason for Admission: COPD exacerbation Primary (+Secondary) Diagnosis: COPD exacerbation (Respiratory failure)  Requested followup by PCP: none New medications on discharge: none Changes to current meds on discharge: none Hospital H&P and Discharge Summary have been reviewed  - Patient presents today 10 days after recent hospitalization. Brief summary of recent course, patient had symptoms of shortness of breath and cough for several months w/ worsening symptoms about 3 days prior to ED visit. Hospitalized for COPD and treated with BiPAP, antibiotics, steroid, inhaled medications. - Today reports overall has done well after discharge. Still has persistent cough, but states it is better by about 60% since leaving the hospital.  Still has some shortness of breath, but is also improving. Activities: normal activities, but when he gets tired he has chest tightness that resolves after several minutes of rest (< 5 minutes) - Has not yet completed PFT evaluation. - Notes he was not started on any new outpatient medications and only has his albuterol inhaler from my last visit w/ pt on 01/10/17.  Social History   Tobacco Use  . Smoking status: Former Smoker    Last attempt to quit: 01/11/2007    Years since quitting: 10.1  . Smokeless tobacco: Never Used  Substance Use Topics  . Alcohol use: No  . Drug use: No    Review of Systems Per HPI unless specifically indicated above  Outpatient Encounter Medications as of 02/02/2017  Medication Sig Note  .  albuterol (PROVENTIL HFA;VENTOLIN HFA) 108 (90 Base) MCG/ACT inhaler Inhale 2 puffs into the lungs every 6 (six) hours as needed for wheezing or shortness of breath.   Marland Kitchen atorvastatin (LIPITOR) 20 MG tablet Take 1 tablet (20 mg total) by mouth daily.   . hydrochlorothiazide (HYDRODIURIL) 12.5 MG tablet Take 1 tablet (12.5 mg total) by mouth daily.   . Lidocaine HCl 3 % LOTN Apply 1 application topically every 6 (six) hours as needed.   . metFORMIN (GLUCOPHAGE) 500 MG tablet Take 1 tablet (500 mg total) by mouth daily with breakfast.   . Spacer/Aero-Hold Chamber Bags MISC 1 Device by Does not apply route every 6 (six) hours as needed (with inhaler).   . [DISCONTINUED] metoprolol tartrate (LOPRESSOR) 50 MG tablet Take 1 tablet (50 mg total) by mouth 2 (two) times daily.   . [DISCONTINUED] predniSONE (STERAPRED UNI-PAK 21 TAB) 10 MG (21) TBPK tablet taper by 10 mg daily   . budesonide-formoterol (SYMBICORT) 80-4.5 MCG/ACT inhaler Inhale 2 puffs 2 (two) times daily into the lungs.   Marland Kitchen losartan (COZAAR) 50 MG tablet Take 1 tablet (50 mg total) daily by mouth.   . umeclidinium bromide (INCRUSE ELLIPTA) 62.5 MCG/INH AEPB Inhale 1 puff daily into the lungs.   . [DISCONTINUED] tiotropium (SPIRIVA HANDIHALER) 18 MCG inhalation capsule Place 1 capsule (18 mcg total) daily into inhaler and inhale. 02/15/2017: incruse is preferred for medicare   No facility-administered encounter medications on file as of 02/02/2017.        Objective:    BP (!) 132/58 (BP Location: Right Arm, Patient Position: Sitting, Cuff Size: Small)  Pulse (!) 59   Temp 98 F (36.7 C) (Oral)   Resp 18   Ht 5' (1.524 m)   Wt 110 lb 6.4 oz (50.1 kg)   SpO2 100%   BMI 21.56 kg/m   Wt Readings from Last 3 Encounters:  02/07/17 109 lb 12.8 oz (49.8 kg)  02/02/17 110 lb 6.4 oz (50.1 kg)  01/21/17 108 lb 3.9 oz (49.1 kg)    Physical Exam  Constitutional: He is oriented to person, place, and time. He appears well-developed and  well-nourished. No distress.  HENT:  Head: Normocephalic and atraumatic.  Mouth/Throat: Oropharynx is clear and moist.  Neck: Normal range of motion. Neck supple.  Cardiovascular: Normal rate, regular rhythm, normal heart sounds and intact distal pulses.  Pulmonary/Chest: Effort normal. No respiratory distress. He has wheezes (at end expiration only). He has no rhonchi. He has no rales.  Neurological: He is alert and oriented to person, place, and time. No cranial nerve deficit.  Skin: Skin is warm and dry.  Psychiatric: He has a normal mood and affect. His behavior is normal. Judgment and thought content normal.  Vitals reviewed.   Results for orders placed or performed during the hospital encounter of 01/21/17  MRSA PCR Screening  Result Value Ref Range   MRSA by PCR NEGATIVE NEGATIVE  CBC with Differential  Result Value Ref Range   WBC 16.1 (H) 3.8 - 10.6 K/uL   RBC 5.25 4.40 - 5.90 MIL/uL   Hemoglobin 16.5 13.0 - 18.0 g/dL   HCT 48.1 40.0 - 52.0 %   MCV 91.7 80.0 - 100.0 fL   MCH 31.4 26.0 - 34.0 pg   MCHC 34.2 32.0 - 36.0 g/dL   RDW 13.0 11.5 - 14.5 %   Platelets 323 150 - 440 K/uL   Neutrophils Relative % 46 %   Neutro Abs 7.6 (H) 1.4 - 6.5 K/uL   Lymphocytes Relative 41 %   Lymphs Abs 6.6 (H) 1.0 - 3.6 K/uL   Monocytes Relative 7 %   Monocytes Absolute 1.1 (H) 0.2 - 1.0 K/uL   Eosinophils Relative 5 %   Eosinophils Absolute 0.7 0 - 0.7 K/uL   Basophils Relative 1 %   Basophils Absolute 0.1 0 - 0.1 K/uL  Basic metabolic panel  Result Value Ref Range   Sodium 137 135 - 145 mmol/L   Potassium 3.3 (L) 3.5 - 5.1 mmol/L   Chloride 96 (L) 101 - 111 mmol/L   CO2 28 22 - 32 mmol/L   Glucose, Bld 144 (H) 65 - 99 mg/dL   BUN 16 6 - 20 mg/dL   Creatinine, Ser 0.88 0.61 - 1.24 mg/dL   Calcium 9.8 8.9 - 10.3 mg/dL   GFR calc non Af Amer >60 >60 mL/min   GFR calc Af Amer >60 >60 mL/min   Anion gap 13 5 - 15  Troponin I  Result Value Ref Range   Troponin I <0.03 <0.03  ng/mL  Brain natriuretic peptide  Result Value Ref Range   B Natriuretic Peptide 27.0 0.0 - 100.0 pg/mL  Blood gas, arterial  Result Value Ref Range   FIO2 0.30    Delivery systems BILEVEL POSITIVE AIRWAY PRESSURE    pH, Arterial 7.42 7.350 - 7.450   pCO2 arterial 47 32.0 - 48.0 mmHg   pO2, Arterial 112 (H) 83.0 - 108.0 mmHg   Bicarbonate 30.5 (H) 20.0 - 28.0 mmol/L   Acid-Base Excess 4.9 (H) 0.0 - 2.0 mmol/L   O2 Saturation 98.5 %  Patient temperature 37.0    Collection site RIGHT RADIAL    Sample type ARTERIAL DRAW    Allens test (pass/fail) PASS PASS  Glucose, capillary  Result Value Ref Range   Glucose-Capillary 224 (H) 65 - 99 mg/dL  Basic metabolic panel  Result Value Ref Range   Sodium 136 135 - 145 mmol/L   Potassium 3.5 3.5 - 5.1 mmol/L   Chloride 96 (L) 101 - 111 mmol/L   CO2 27 22 - 32 mmol/L   Glucose, Bld 159 (H) 65 - 99 mg/dL   BUN 24 (H) 6 - 20 mg/dL   Creatinine, Ser 1.10 0.61 - 1.24 mg/dL   Calcium 10.1 8.9 - 10.3 mg/dL   GFR calc non Af Amer >60 >60 mL/min   GFR calc Af Amer >60 >60 mL/min   Anion gap 13 5 - 15  CBC  Result Value Ref Range   WBC 10.9 (H) 3.8 - 10.6 K/uL   RBC 4.78 4.40 - 5.90 MIL/uL   Hemoglobin 15.1 13.0 - 18.0 g/dL   HCT 44.1 40.0 - 52.0 %   MCV 92.4 80.0 - 100.0 fL   MCH 31.6 26.0 - 34.0 pg   MCHC 34.2 32.0 - 36.0 g/dL   RDW 13.4 11.5 - 14.5 %   Platelets 335 150 - 440 K/uL  Glucose, capillary  Result Value Ref Range   Glucose-Capillary 219 (H) 65 - 99 mg/dL   Comment 1 Notify RN   Glucose, capillary  Result Value Ref Range   Glucose-Capillary 179 (H) 65 - 99 mg/dL   Comment 1 Notify RN   Glucose, capillary  Result Value Ref Range   Glucose-Capillary 120 (H) 65 - 99 mg/dL   Comment 1 Notify RN   Glucose, capillary  Result Value Ref Range   Glucose-Capillary 134 (H) 65 - 99 mg/dL   Comment 1 Notify RN   Glucose, capillary  Result Value Ref Range   Glucose-Capillary 232 (H) 65 - 99 mg/dL   Comment 1 Notify RN     Glucose, capillary  Result Value Ref Range   Glucose-Capillary 123 (H) 65 - 99 mg/dL   Comment 1 Notify RN   Glucose, capillary  Result Value Ref Range   Glucose-Capillary 274 (H) 65 - 99 mg/dL      Assessment & Plan:   Problem List Items Addressed This Visit      Cardiovascular and Mediastinum   Essential hypertension    Improved, but not yet at goal of < 130/80.  Pt is currently on hydrochlorothiazide and tolerating well.  Plan: 1. Continue hydrochlorothiazide 12.5 mg once daily.  - CHANGE acei to arb for chronic cough.  START losartan 50 mg once daily. 2. Recheck BP at followup in about 2 months (December appointment already scheduled).      Relevant Medications   losartan (COZAAR) 50 MG tablet   Other Relevant Orders   BASIC METABOLIC PANEL WITH GFR     Respiratory   COPD (chronic obstructive pulmonary disease) ([place]) - Primary    Pt w/ recent hospitalization for COPD exacerbation.  Was not started on any long-term maintenance medications.  Pt has continued to use albuterol 1 puff every 4 hours as needed for shortness of breath and wheezing.  Currently doing well and beginning to resume normal activities.  Plan: 1. Encouraged regular physical activity. 2. Continue plan for PFTs.  When scheduling, make sure to stop long-acting inhalers for 7 days prior to testing. 3. START incruse - Take one  inhalation once daily. 4. START symbicort - Take 2 puffs twice daily. 5. Followup in December.  Discussed w/ pt and daughter when to call clinic for followup sooner (worsening breathing, cough).  Goal is to prevent hospitalization.  Verbalize understanding.      Relevant Medications   budesonide-formoterol (SYMBICORT) 80-4.5 MCG/ACT inhaler   umeclidinium bromide (INCRUSE ELLIPTA) 62.5 MCG/INH AEPB    Other Visit Diagnoses    Hospitalization within last 30 days       See AP for COPD      Meds ordered this encounter  Medications  . DISCONTD: tiotropium (SPIRIVA HANDIHALER) 18  MCG inhalation capsule    Sig: Place 1 capsule (18 mcg total) daily into inhaler and inhale.    Dispense:  30 capsule    Refill:  5    Order Specific Question:   Supervising Provider    Answer:   Olin Hauser [[id number]]  . budesonide-formoterol (SYMBICORT) 80-4.5 MCG/ACT inhaler    Sig: Inhale 2 puffs 2 (two) times daily into the lungs.    Dispense:  1 Inhaler    Refill:  3    Order Specific Question:   Supervising Provider    Answer:   Olin Hauser [[id number]]  . losartan (COZAAR) 50 MG tablet    Sig: Take 1 tablet (50 mg total) daily by mouth.    Dispense:  30 tablet    Refill:  2    Order Specific Question:   Supervising Provider    Answer:   Olin Hauser [[id number]]  . umeclidinium bromide (INCRUSE ELLIPTA) 62.5 MCG/INH AEPB    Sig: Inhale 1 puff daily into the lungs.    Dispense:  1 each    Refill:  6    I have reviewed the discharge medication list, and have reconciled the current and discharge medications today.  Follow up plan: Return for COPD/emphysema.   [name], DNP, AGPCNP-BC Adult Gerontology Primary Care Nurse Practitioner [facility] 02/15/2017, 9:08 AM

## 2017-02-07 ENCOUNTER — Ambulatory Visit (INDEPENDENT_AMBULATORY_CARE_PROVIDER_SITE_OTHER): Payer: Medicare Other

## 2017-02-07 VITALS — BP 122/68 | HR 70 | Temp 98.2°F | Resp 16 | Ht 60.0 in | Wt 109.8 lb

## 2017-02-07 DIAGNOSIS — Z0000 Encounter for general adult medical examination without abnormal findings: Secondary | ICD-10-CM | POA: Diagnosis not present

## 2017-02-07 DIAGNOSIS — Z1159 Encounter for screening for other viral diseases: Secondary | ICD-10-CM

## 2017-02-07 DIAGNOSIS — Z1211 Encounter for screening for malignant neoplasm of colon: Secondary | ICD-10-CM | POA: Diagnosis not present

## 2017-02-07 DIAGNOSIS — J431 Panlobular emphysema: Secondary | ICD-10-CM

## 2017-02-07 NOTE — Progress Notes (Signed)
Subjective:   Jonathan Willis is a 71 y.o. male who presents for Medicare Annual/Subsequent preventive examination.   Declined interpretor services. Jonathan Willis, daughter in law interpreted todays visit. Consent in chart.  Review of Systems:   Cardiac Risk Factors include: hypertension;advanced age (>62men, >42 women);male gender;diabetes mellitus     Objective:    Vitals: BP 122/68 (BP Location: Left Arm, Patient Position: Sitting)   Pulse 70   Temp 98.2 F (36.8 C) (Oral)   Resp 16   Ht 5' (1.524 m)   Wt 109 lb 12.8 oz (49.8 kg)   BMI 21.44 kg/m   Body mass index is 21.44 kg/m.  Tobacco Social History   Tobacco Use  Smoking Status Former Smoker  . Last attempt to quit: 01/11/2007  . Years since quitting: 10.0  Smokeless Tobacco Never Used     Counseling given: Not Answered   Past Medical History:  Diagnosis Date  . Chronic cough 01/16/2017  . Diabetes mellitus without complication ([place])   . External hemorrhoid   . 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 History of TB (tuberculosis)   . Hypertension   . Lower GI bleed    Past Surgical History:  Procedure Laterality Date  . NO PAST SURGERIES     Family History  Problem Relation Age of Onset  . COPD Neg Hx   . Diabetes Mellitus II Neg Hx   . Hypertension Neg Hx    Social History   Substance and Sexual Activity  Sexual Activity No    Outpatient Encounter Medications as of 02/07/2017  Medication Sig  . albuterol (PROVENTIL HFA;VENTOLIN HFA) 108 (90 Base) MCG/ACT inhaler Inhale 2 puffs into the lungs every 6 (six) hours as needed for wheezing or shortness of breath.  Marland Kitchen atorvastatin (LIPITOR) 20 MG tablet Take 1 tablet (20 mg total) by mouth daily.  . budesonide-formoterol (SYMBICORT) 80-4.5 MCG/ACT inhaler Inhale 2 puffs 2 (two) times daily into the lungs.  . hydrochlorothiazide (HYDRODIURIL) 12.5 MG tablet Take 1 tablet (12.5 mg total) by mouth daily.    . Lidocaine HCl 3 % LOTN Apply 1 application topically every 6 (six) hours as needed.  Marland Kitchen losartan (COZAAR) 50 MG tablet Take 1 tablet (50 mg total) daily by mouth.  . metFORMIN (GLUCOPHAGE) 500 MG tablet Take 1 tablet (500 mg total) by mouth daily with breakfast.  . Spacer/Aero-Hold Chamber Bags MISC 1 Device by Does not apply route every 6 (six) hours as needed (with inhaler).  . tiotropium (SPIRIVA HANDIHALER) 18 MCG inhalation capsule Place 1 capsule (18 mcg total) daily into inhaler and inhale.  . umeclidinium bromide (INCRUSE ELLIPTA) 62.5 MCG/INH AEPB Inhale 1 puff daily into the lungs.   No facility-administered encounter medications on file as of 02/07/2017.     Activities of Daily Living In your present state of health, do you have any difficulty performing the following activities: 02/07/2017 01/21/2017  Hearing? N N  Vision? N N  Difficulty concentrating or making decisions? N N  Walking or climbing stairs? N N  Dressing or bathing? N N  Doing errands, shopping? N -  Preparing Food and eating ? N -  Using the Toilet? N -  In the past six months, have you accidently leaked urine? N -  Do you have problems with loss of bowel control? N -  Managing your Medications? N -  Managing your Finances? N -  Housekeeping or managing your Housekeeping? N -  Some recent data might be hidden    Patient Care Team: Mikey College, NP as PCP - General (Nurse Practitioner)   Assessment:     Exercise Activities and Dietary recommendations Current Exercise Habits: The patient does not participate in regular exercise at present, Exercise limited by: None identified  Goals    None     Fall Risk Fall Risk  02/07/2017 01/10/2017  Falls in the past year? No No   Depression Screen PHQ 2/9 Scores 02/07/2017 01/10/2017  PHQ - 2 Score 0 0    Cognitive Function  unable to perform       Immunization History  Administered Date(s) Administered  . Hepatitis B 09/18/1996,  03/17/1997  . Influenza, High Dose Seasonal PF 01/10/2017  . Pneumococcal Conjugate-13 01/10/2017  . Td 08/07/1995, 10/07/1995, 03/29/1996   Screening Tests Health Maintenance  Topic Date Due  . Hepatitis C Screening  1945-11-30  . FOOT EXAM  [birth year]/1953  . OPHTHALMOLOGY EXAM  12/13/1953  . COLONOSCOPY  12/13/1993  . TETANUS/TDAP  02/07/2018 (Originally 03/29/2006)  . HEMOGLOBIN A1C  07/11/2017  . PNA vac Low Risk Adult (2 of 2 - PPSV23) 01/10/2018  . INFLUENZA VACCINE  Completed      Plan:     I have personally reviewed and addressed the Medicare Annual Wellness questionnaire and have noted the following in the patient's chart:  A. Medical and social history B. Use of alcohol, tobacco or illicit drugs  C. Current medications and supplements D. Functional ability and status E.  Nutritional status F.  Physical activity G. Advance directives H. List of other physicians I.  Hospitalizations, surgeries, and ER visits in previous 12 months J.  [place] such as hearing and vision if needed, cognitive and depression L. Referrals and appointments   In addition, I have reviewed and discussed with patient certain preventive protocols, quality metrics, and best practice recommendations. A written personalized care plan for preventive services as well as general preventive health recommendations were provided to patient.   Signed,  Tyler Aas, LPN Nurse Health Advisor   Nurse Notes: daughter in law states patient was supposed to see pulmonologist for his emphsyema. No referrals in database for this.

## 2017-02-07 NOTE — Addendum Note (Signed)
Addended by: [name] R on: 02/07/2017 01:21 PM   Modules accepted: Orders

## 2017-02-07 NOTE — Patient Instructions (Signed)
Mr. Jonathan Willis , Thank you for taking time to come for your Medicare Wellness Visit. I appreciate your ongoing commitment to your health goals. Please review the following plan we discussed and let me know if I can assist you in the future.   Screening recommendations/referrals: Colonoscopy: due- will refer to GI Recommended yearly ophthalmology/optometry visit for glaucoma screening and checkup Recommended yearly dental visit for hygiene and checkup  Vaccinations: Influenza vaccine: up to date Pneumococcal vaccine: up to date, pneumovax 23 due 1016/2019 Tdap vaccine: due, check with your insurance company for coverage Shingles vaccine: due, check with your insurance company for coverage   Advanced directives: Advance directive discussed with you today. Even though you declined this today please call our office should you change your mind and we can give you the proper paperwork for you to fill out.  Conditions/risks identified: Recommend drinking at least 5-6 glasses of water a day   Next appointment:  Follow up on 02/15/2017 at 8:00am for lab work at [facility]. Follow up on 03/13/2017 at 8:20am with Lissa Merlin. Follow up in one year (02/08/2018) for your annual wellness exam.  Preventive Care 65 Years and Older, Male Preventive care refers to lifestyle choices and visits with your health care provider that can promote health and wellness. What does preventive care include?  A yearly physical exam. This is also called an annual well check.  Dental exams once or twice a year.  Routine eye exams. Ask your health care provider how often you should have your eyes checked.  Personal lifestyle choices, including:  Daily care of your teeth and gums.  Regular physical activity.  Eating a healthy diet.  Avoiding tobacco and drug use.  Limiting alcohol use.  Practicing safe sex.  Taking low doses of aspirin every day.  Taking vitamin and mineral supplements as recommended by your  health care provider. What happens during an annual well check? The services and screenings done by your health care provider during your annual well check will depend on your age, overall health, lifestyle risk factors, and family history of disease. Counseling  Your health care provider may ask you questions about your:  Alcohol use.  Tobacco use.  Drug use.  Emotional well-being.  Home and relationship well-being.  Sexual activity.  Eating habits.  History of falls.  Memory and ability to understand (cognition).  Work and work Statistician. Screening  You may have the following tests or measurements:  Height, weight, and BMI.  Blood pressure.  Lipid and cholesterol levels. These may be checked every 5 years, or more frequently if you are over 17 years old.  Skin check.  Lung cancer screening. You may have this screening every year starting at age 56 if you have a 30-pack-year history of smoking and currently smoke or have quit within the past 15 years.  Fecal occult blood test (FOBT) of the stool. You may have this test every year starting at age 40.  Flexible sigmoidoscopy or colonoscopy. You may have a sigmoidoscopy every 5 years or a colonoscopy every 10 years starting at age 75.  Prostate cancer screening. Recommendations will vary depending on your family history and other risks.  Hepatitis C blood test.  Hepatitis B blood test.  Sexually transmitted disease (STD) testing.  Diabetes screening. This is done by checking your blood sugar (glucose) after you have not eaten for a while (fasting). You may have this done every 1-3 years.  Abdominal aortic aneurysm (AAA) screening. You may need this if  you are a current or former smoker.  Osteoporosis. You may be screened starting at age 42 if you are at high risk. Talk with your health care provider about your test results, treatment options, and if necessary, the need for more tests. Vaccines  Your health  care provider may recommend certain vaccines, such as:  Influenza vaccine. This is recommended every year.  Tetanus, diphtheria, and acellular pertussis (Tdap, Td) vaccine. You may need a Td booster every 10 years.  Zoster vaccine. You may need this after age 48.  Pneumococcal 13-valent conjugate (PCV13) vaccine. One dose is recommended after age 4.  Pneumococcal polysaccharide (PPSV23) vaccine. One dose is recommended after age 77. Talk to your health care provider about which screenings and vaccines you need and how often you need them. This information is not intended to replace advice given to you by your health care provider. Make sure you discuss any questions you have with your health care provider. Document Released: 04/10/2015 Document Revised: 12/02/2015 Document Reviewed: 01/13/2015 Elsevier Interactive Patient Education  2017 [place] Prevention in the Home Falls can cause injuries. They can happen to people of all ages. There are many things you can do to make your home safe and to help prevent falls. What can I do on the outside of my home?  Regularly fix the edges of walkways and driveways and fix any cracks.  Remove anything that might make you trip as you walk through a door, such as a raised step or threshold.  Trim any bushes or trees on the path to your home.  Use bright outdoor lighting.  Clear any walking paths of anything that might make someone trip, such as rocks or tools.  Regularly check to see if handrails are loose or broken. Make sure that both sides of any steps have handrails.  Any raised decks and porches should have guardrails on the edges.  Have any leaves, snow, or ice cleared regularly.  Use sand or salt on walking paths during winter.  Clean up any spills in your garage right away. This includes oil or grease spills. What can I do in the bathroom?  Use night lights.  Install grab bars by the toilet and in the tub and  shower. Do not use towel bars as grab bars.  Use non-skid mats or decals in the tub or shower.  If you need to sit down in the shower, use a plastic, non-slip stool.  Keep the floor dry. Clean up any water that spills on the floor as soon as it happens.  Remove soap buildup in the tub or shower regularly.  Attach bath mats securely with double-sided non-slip rug tape.  Do not have throw rugs and other things on the floor that can make you trip. What can I do in the bedroom?  Use night lights.  Make sure that you have a light by your bed that is easy to reach.  Do not use any sheets or blankets that are too big for your bed. They should not hang down onto the floor.  Have a firm chair that has side arms. You can use this for support while you get dressed.  Do not have throw rugs and other things on the floor that can make you trip. What can I do in the kitchen?  Clean up any spills right away.  Avoid walking on wet floors.  Keep items that you use a lot in easy-to-reach places.  If you need to  reach something above you, use a strong step stool that has a grab bar.  Keep electrical cords out of the way.  Do not use floor polish or wax that makes floors slippery. If you must use wax, use non-skid floor wax.  Do not have throw rugs and other things on the floor that can make you trip. What can I do with my stairs?  Do not leave any items on the stairs.  Make sure that there are handrails on both sides of the stairs and use them. Fix handrails that are broken or loose. Make sure that handrails are as long as the stairways.  Check any carpeting to make sure that it is firmly attached to the stairs. Fix any carpet that is loose or worn.  Avoid having throw rugs at the top or bottom of the stairs. If you do have throw rugs, attach them to the floor with carpet tape.  Make sure that you have a light switch at the top of the stairs and the bottom of the stairs. If you do not  have them, ask someone to add them for you. What else can I do to help prevent falls?  Wear shoes that:  Do not have high heels.  Have rubber bottoms.  Are comfortable and fit you well.  Are closed at the toe. Do not wear sandals.  If you use a stepladder:  Make sure that it is fully opened. Do not climb a closed stepladder.  Make sure that both sides of the stepladder are locked into place.  Ask someone to hold it for you, if possible.  Clearly mark and make sure that you can see:  Any grab bars or handrails.  First and last steps.  Where the edge of each step is.  Use tools that help you move around (mobility aids) if they are needed. These include:  Canes.  Walkers.  Scooters.  Crutches.  Turn on the lights when you go into a dark area. Replace any light bulbs as soon as they burn out.  Set up your furniture so you have a clear path. Avoid moving your furniture around.  If any of your floors are uneven, fix them.  If there are any pets around you, be aware of where they are.  Review your medicines with your doctor. Some medicines can make you feel dizzy. This can increase your chance of falling. Ask your doctor what other things that you can do to help prevent falls. This information is not intended to replace advice given to you by your health care provider. Make sure you discuss any questions you have with your health care provider. Document Released: 01/08/2009 Document Revised: 08/20/2015 Document Reviewed: 04/18/2014 Elsevier Interactive Patient Education  2017 [employer].

## 2017-02-15 ENCOUNTER — Other Ambulatory Visit: Payer: Medicare Other

## 2017-02-15 DIAGNOSIS — I10 Essential (primary) hypertension: Secondary | ICD-10-CM

## 2017-02-15 DIAGNOSIS — Z1159 Encounter for screening for other viral diseases: Secondary | ICD-10-CM | POA: Diagnosis not present

## 2017-02-15 NOTE — Assessment & Plan Note (Addendum)
Improved, but not yet at goal of < 130/80.  Pt is currently on hydrochlorothiazide and tolerating well.  Plan: 1. Continue hydrochlorothiazide 12.5 mg once daily.  - CHANGE acei to arb for chronic cough.  START losartan 50 mg once daily. 2. Recheck BP at followup in about 2 months (December appointment already scheduled).

## 2017-02-15 NOTE — Assessment & Plan Note (Addendum)
Pt w/ recent hospitalization for COPD exacerbation.  Was not started on any long-term maintenance medications.  Pt has continued to use albuterol 1 puff every 4 hours as needed for shortness of breath and wheezing.  Currently doing well and beginning to resume normal activities.  Plan: 1. Encouraged regular physical activity. 2. Continue plan for PFTs.  When scheduling, make sure to stop long-acting inhalers for 7 days prior to testing. 3. START incruse - Take one inhalation once daily. 4. START symbicort - Take 2 puffs twice daily. 5. Followup in December.  Discussed w/ pt and daughter when to call clinic for followup sooner (worsening breathing, cough).  Goal is to prevent hospitalization.  Verbalize understanding.

## 2017-02-16 LAB — BASIC METABOLIC PANEL WITH GFR
BUN: 14 mg/dL (ref 7–25)
CO2: 29 mmol/L (ref 20–32)
Calcium: 9.7 mg/dL (ref 8.6–10.3)
Chloride: 100 mmol/L (ref 98–110)
Creat: 0.93 mg/dL (ref 0.70–1.18)
GFR, Est African American: 94 mL/min/{1.73_m2} (ref >=60)
GFR, Est Non African American: 81 mL/min/{1.73_m2} (ref >=60)
Glucose, Bld: 116 mg/dL — ABNORMAL HIGH (ref 65–99)
Potassium: 4 mmol/L (ref 3.5–5.3)
Sodium: 138 mmol/L (ref 135–146)

## 2017-02-16 LAB — HEPATITIS C ANTIBODY
Hepatitis C Ab: NONREACTIVE
SIGNAL TO CUT-OFF: 0.01 (ref <1.00)

## 2017-03-13 ENCOUNTER — Other Ambulatory Visit: Payer: Self-pay

## 2017-03-13 ENCOUNTER — Ambulatory Visit (INDEPENDENT_AMBULATORY_CARE_PROVIDER_SITE_OTHER): Payer: Medicare Other | Admitting: Nurse Practitioner

## 2017-03-13 ENCOUNTER — Encounter: Payer: Self-pay | Admitting: Nurse Practitioner

## 2017-03-13 VITALS — BP 124/62 | HR 70 | Temp 97.8°F | Resp 18 | Ht 60.0 in | Wt 108.2 lb

## 2017-03-13 DIAGNOSIS — R3914 Feeling of incomplete bladder emptying: Secondary | ICD-10-CM

## 2017-03-13 DIAGNOSIS — N401 Enlarged prostate with lower urinary tract symptoms: Secondary | ICD-10-CM

## 2017-03-13 DIAGNOSIS — J431 Panlobular emphysema: Secondary | ICD-10-CM

## 2017-03-13 MED ORDER — TAMSULOSIN HCL 0.4 MG PO CAPS: 0.4000 mg | ORAL_CAPSULE | Freq: Every day | ORAL | 5 refills | Status: DC

## 2017-03-13 NOTE — Patient Instructions (Addendum)
Jonathan Willis, Thank you for coming in to clinic today.  1. Continue all inhalers for your COPD.  Make no changes as these are helping your current symptoms.  2. For your urinary frequency: This is BPH or benign prostatic hypertrophy (not cancerous enlarged prostate). - START tamsulosin 0.4 mg once daily.  Please schedule a follow-up appointment with [name], AGNP. Return in about 3 months (around 06/11/2017) for COPD, blood pressure, diabetes, BPH.  If you have any other questions or concerns, please feel free to call the clinic or send a message through [place]. You may also schedule an earlier appointment if necessary.  You will receive a survey after today's visit either digitally by e-mail or paper by [employer]. Your experiences and feedback matter to [place].  Please respond so we know how we are doing as we provide care for you.   [name], DNP, AGNP-BC Adult Gerontology Nurse Practitioner [place]

## 2017-03-13 NOTE — Progress Notes (Signed)
Subjective:    Patient ID: Jonathan Willis, male    DOB: 04-10-1945, 71 y.o.   MRN: [id number]  Jonathan Willis is a 71 y.o. male presenting on 03/13/2017 for Emphysema and Urinary Frequency (nocturia 3-4 mths)  Pt's daughter in law is Jonathan Willis.  She is present today and is acting as interpreter.  Consent is in chart from last visit.  Telephone interpreter offered today and declined by patient and Jonathan Willis.    HPI COPD/ Chronic Cough Cough is better, but still notes shallow throat-clearing cough is frequent.  Pt is currently using two long-acting inhalers daily and albuterol prn 2-3 days per week.   Pt is taking:  - red inhaler/symbicort 2 puffs twice daily  - blue inhaler/albuterol 1-2 puffs prn about 2-3 days per week - green inhaler/Incruse one puff once daily.  Visually confirmed color/drug as correct medications w/ pt med list.    Urinary Frequency Pt reports significant increase in urinary frequency and nocturia over the last 3-4 months. - no difficulty starting stream - Incompletely emptying occurs in day and night.  He notes through [place] that he feels he is finished urinating, but he starts urinating again after stopping. - Nocturia 3-4 times at night during sleep.  Also has frequency during day, but is less significant for pt. Pt denies any blood in urine  Social History   Tobacco Use  . Smoking status: Former Smoker    Last attempt to quit: 01/11/2007    Years since quitting: 10.1  . Smokeless tobacco: Never Used  Substance Use Topics  . Alcohol use: No  . Drug use: No    Review of Systems Per HPI unless specifically indicated above     Objective:    BP 124/62 (BP Location: Left Arm, Patient Position: Sitting, Cuff Size: Small)   Pulse 70   Temp 97.8 F (36.6 C) (Oral)   Resp 18   Ht 5' (1.524 m)   Wt 108 lb 3.2 oz (49.1 kg)   SpO2 100%   BMI 21.13 kg/m   Wt Readings from Last 3 Encounters:  03/13/17 108 lb 3.2 oz (49.1 kg)  02/07/17 109 lb 12.8 oz (49.8 kg)    02/02/17 110 lb 6.4 oz (50.1 kg)    Physical Exam  Constitutional: He is oriented to person, place, and time. He appears well-developed and well-nourished. No distress.  HENT:  Head: Normocephalic and atraumatic.  Neck: Normal range of motion. Neck supple. No JVD present.  Cardiovascular: Normal rate, regular rhythm, S1 normal, S2 normal, normal heart sounds and intact distal pulses.  Pulmonary/Chest: Effort normal and breath sounds normal. No respiratory distress. He has no decreased breath sounds. He has no wheezes. He has no rhonchi. He has no rales.  Genitourinary: Rectum normal. Prostate is enlarged (firm, smooth, approx 40g prostate size). Prostate is not tender.  Neurological: He is alert and oriented to person, place, and time.  Skin: Skin is warm and dry.  Psychiatric: He has a normal mood and affect. His behavior is normal.  Vitals reviewed.   Results for orders placed or performed in visit on 02/15/17  Hepatitis C antibody screen  Result Value Ref Range   Hepatitis C Ab NON-REACTIVE NON-REACTI   SIGNAL TO CUT-OFF 0.01 <6.76  BASIC METABOLIC PANEL WITH GFR  Result Value Ref Range   Glucose, Bld 116 (H) 65 - 99 mg/dL   BUN 14 7 - 25 mg/dL   Creat 0.93 0.70 - 1.18 mg/dL   GFR, Est  Non African American 81 > OR = 60 mL/min/1.74m2   GFR, Est African American 94 > OR = 60 mL/min/1.93m2   BUN/Creatinine Ratio NOT APPLICABLE 6 - 22 (calc)   Sodium 138 135 - 146 mmol/L   Potassium 4.0 3.5 - 5.3 mmol/L   Chloride 100 98 - 110 mmol/L   CO2 29 20 - 32 mmol/L   Calcium 9.7 8.6 - 10.3 mg/dL      Assessment & Plan:   Problem List Items Addressed This Visit    None    Visit Diagnoses    Benign prostatic hyperplasia with incomplete bladder emptying    -  Primary   Relevant Medications   tamsulosin (FLOMAX) 0.4 MG CAPS capsule      Meds ordered this encounter  Medications  . tamsulosin (FLOMAX) 0.4 MG CAPS capsule    Sig: Take 1 capsule (0.4 mg total) by mouth daily.     Dispense:  30 capsule    Refill:  5    Order Specific Question:   Supervising Provider    Answer:   Olin Hauser [[id number]]      Follow up plan: Return in about 3 months (around 06/11/2017) for COPD, blood pressure, diabetes, BPH.  [name], DNP, AGPCNP-BC Adult Gerontology Primary Care Nurse Practitioner [facility] 03/13/2017, 8:46 AM

## 2017-03-23 DIAGNOSIS — N400 Enlarged prostate without lower urinary tract symptoms: Secondary | ICD-10-CM | POA: Insufficient documentation

## 2017-03-23 DIAGNOSIS — N401 Enlarged prostate with lower urinary tract symptoms: Secondary | ICD-10-CM | POA: Insufficient documentation

## 2017-03-23 DIAGNOSIS — R3914 Feeling of incomplete bladder emptying: Principal | ICD-10-CM

## 2017-03-23 NOTE — Assessment & Plan Note (Signed)
Pt without prior diagnosis of BPH, but has enlarged prostate on DRE today and has positive LUTS symptoms.      Plan: 1. Encouraged pt to consider for evaluation of possible prostate cancer with PSA, but with smooth, round prostate concern for prostate cancer is lower.  Discussed utility of PSA and pt would like to defer to ne  xt visit.   2. Discussed medication, and pt would like to start flomax. - START flomax 0.4 mg once dailly 3. Continue to monitor urinary symptoms.  Can increase dose to 0.8 mg once daily. 4. Followup 3 months

## 2017-03-23 NOTE — Assessment & Plan Note (Signed)
Controlled on current medication regimen. Patient without any additional symptoms of COPD exacerbation since last hospitalization on 01/21/2017.  Pt continues to perform normal daily activities and has increased exercise some.  Plan: 1. Encouraged regular physical activity. 2.  Continue Incruse - Take one inhalation once daily. 3. Continue Symbicort - Take 2 puffs twice daily. 4. Followup in 3 months

## 2017-03-24 ENCOUNTER — Other Ambulatory Visit: Payer: Self-pay

## 2017-03-24 DIAGNOSIS — J431 Panlobular emphysema: Secondary | ICD-10-CM

## 2017-03-24 DIAGNOSIS — I10 Essential (primary) hypertension: Secondary | ICD-10-CM

## 2017-03-24 MED ORDER — LOSARTAN POTASSIUM 50 MG PO TABS: 50.0000 mg | ORAL_TABLET | Freq: Every day | ORAL | 1 refills | Status: DC

## 2017-03-24 MED ORDER — BUDESONIDE-FORMOTEROL FUMARATE 80-4.5 MCG/ACT IN AERO
2.0000 | INHALATION_SPRAY | Freq: Two times a day (BID) | RESPIRATORY_TRACT | 1 refills | Status: DC
Start: 2017-03-24 — End: 2017-06-12

## 2017-04-03 ENCOUNTER — Other Ambulatory Visit: Payer: Self-pay

## 2017-04-03 DIAGNOSIS — Z1211 Encounter for screening for malignant neoplasm of colon: Secondary | ICD-10-CM

## 2017-04-03 MED ORDER — NA SULFATE-K SULFATE-MG SULF 17.5-3.13-1.6 GM/177ML PO SOLN: 1.0000 | Freq: Once | ORAL | 0 refills | Status: AC

## 2017-04-03 NOTE — Progress Notes (Signed)
-   Spoke to daughter [name]. Emailed instructions and sent Rx to pharmacy. Mailed packet as well.   Gastroenterology Pre-Procedure Review  Request Date: 04/10/17 Requesting Physician: Dr. Vicente Males  PATIENT REVIEW QUESTIONS: The patient responded to the following health history questions as indicated:    1. Are you having any GI issues? no 2. Do you have a personal history of Polyps? no 3. Do you have a family history of Colon Cancer or Polyps? no 4. Diabetes Mellitus? yes (Type II) 5. Joint replacements in the past 12 months?no 6. Major health problems in the past 3 months?yes (COPD) 7. Any artificial heart valves, MVP, or defibrillator?no    MEDICATIONS & ALLERGIES:    Patient reports the following regarding taking any anticoagulation/antiplatelet therapy:   Plavix, Coumadin, Eliquis, Xarelto, Lovenox, Pradaxa, Brilinta, or Effient? no Aspirin? no  Patient confirms/reports the following medications:  Current Outpatient Medications  Medication Sig Dispense Refill  . albuterol (PROVENTIL HFA;VENTOLIN HFA) 108 (90 Base) MCG/ACT inhaler Inhale 2 puffs into the lungs every 6 (six) hours as needed for wheezing or shortness of breath. 1 Inhaler 2  . atorvastatin (LIPITOR) 20 MG tablet Take 1 tablet (20 mg total) by mouth daily. 30 tablet 5  . budesonide-formoterol (SYMBICORT) 80-4.5 MCG/ACT inhaler Inhale 2 puffs into the lungs 2 (two) times daily. 3 Inhaler 1  . hydrochlorothiazide (HYDRODIURIL) 12.5 MG tablet Take 1 tablet (12.5 mg total) by mouth daily. 30 tablet 5  . Lidocaine HCl 3 % LOTN Apply 1 application topically every 6 (six) hours as needed. (Patient not taking: Reported on 03/13/2017) 1 Tube 5  . losartan (COZAAR) 50 MG tablet Take 1 tablet (50 mg total) by mouth daily. 90 tablet 1  . metFORMIN (GLUCOPHAGE) 500 MG tablet Take 1 tablet (500 mg total) by mouth daily with breakfast. 30 tablet 5  . Spacer/Aero-Hold Chamber Bags MISC 1 Device by Does not apply route every 6 (six) hours  as needed (with inhaler). 1 each 1  . tamsulosin (FLOMAX) 0.4 MG CAPS capsule Take 1 capsule (0.4 mg total) by mouth daily. 30 capsule 5  . umeclidinium bromide (INCRUSE ELLIPTA) 62.5 MCG/INH AEPB Inhale 1 puff daily into the lungs. 1 each 6   No current facility-administered medications for this visit.     Patient confirms/reports the following allergies:  No Known Allergies  No orders of the defined types were placed in this encounter.   AUTHORIZATION INFORMATION Primary Insurance: 1D#: Group #:  Secondary Insurance: 1D#: Group #:  SCHEDULE INFORMATION: Date: 1/14 Time: Location: [place]

## 2017-04-03 NOTE — Addendum Note (Signed)
Addended by: Leontine Locket Z on: 04/03/2017 11:30 AM   Modules accepted: Orders

## 2017-04-05 ENCOUNTER — Encounter: Payer: Self-pay | Admitting: Internal Medicine

## 2017-04-05 ENCOUNTER — Ambulatory Visit (INDEPENDENT_AMBULATORY_CARE_PROVIDER_SITE_OTHER): Payer: Medicare Other | Admitting: Internal Medicine

## 2017-04-05 VITALS — BP 110/68 | HR 72 | Ht 60.0 in | Wt 111.0 lb

## 2017-04-05 DIAGNOSIS — J431 Panlobular emphysema: Secondary | ICD-10-CM

## 2017-04-05 NOTE — Patient Instructions (Signed)
--  Walk for 20 to 30 minutes per day.   --Continue your current inhalers.

## 2017-04-05 NOTE — Progress Notes (Signed)
[facility]     Assessment and Plan:  COPD, group B. -Continue current inhalers, which include Symbicort, Incruz. - I discussed with the patient and his daughter that he should try to increase his physical activity, walking for 20-30 minutes/day.   Date: 04/05/2017  MRN# [id number] Jonathan Willis October 13, 1945    Jonathan Willis is a 72 y.o. old male seen in consultation for chief complaint of:    Chief Complaint  Patient presents with  . Advice Only    SOB at all times: dry cough; chest tightness; chest pain on left side    HPI:   The patient is a 72 year old male with a history of COPD.  He was last seen in October 2018, at that time was admitted to the hospital for acute exacerbation of COPD, he was evaluated by our service thought to have likely ACOS and due to elevated eosinophil count. He is present with his daughter today who provides history.   He feels that his breathing is ok, but is short. He is very sedentary, leaves the house rarely, less than once per week. He does not get winded with getting dressed or bathing. Some days he has trouble breathing.  He uses Incruse once per day, symbicort two puffs twice per day, albuterol as needed, once this week.   He smoked last in the 90's, but he might "sneak" one every few weeks.   04/05/17 Desat walk on RA at rest, sat 99% and HR 81. Walked 360 feet at brisk pace, minimal dyspnea, sat was 96% and HR 88.   CBC eosiniphil count = 700 01/21/17   PMHX:   Past Medical History:  Diagnosis Date  . Chronic cough 01/16/2017  . Diabetes mellitus without complication ([place])   . External hemorrhoid   . 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 History of TB (tuberculosis)   . Hypertension   . Lower GI bleed    Surgical Hx:  Past Surgical History:  Procedure Laterality Date  . NO PAST SURGERIES     Family Hx:  Family History  Problem Relation Age of Onset  . COPD Neg Hx    . Diabetes Mellitus II Neg Hx   . Hypertension Neg Hx    Social Hx:   Social History   Tobacco Use  . Smoking status: Former Smoker    Last attempt to quit: 01/11/2007    Years since quitting: 10.2  . Smokeless tobacco: Never Used  Substance Use Topics  . Alcohol use: No  . Drug use: No   Medication:    Current Outpatient Medications:  .  albuterol (PROVENTIL HFA;VENTOLIN HFA) 108 (90 Base) MCG/ACT inhaler, Inhale 2 puffs into the lungs every 6 (six) hours as needed for wheezing or shortness of breath., Disp: 1 Inhaler, Rfl: 2 .  atorvastatin (LIPITOR) 20 MG tablet, Take 1 tablet (20 mg total) by mouth daily., Disp: 30 tablet, Rfl: 5 .  budesonide-formoterol (SYMBICORT) 80-4.5 MCG/ACT inhaler, Inhale 2 puffs into the lungs 2 (two) times daily., Disp: 3 Inhaler, Rfl: 1 .  hydrochlorothiazide (HYDRODIURIL) 12.5 MG tablet, Take 1 tablet (12.5 mg total) by mouth daily., Disp: 30 tablet, Rfl: 5 .  Lidocaine HCl 3 % LOTN, Apply 1 application topically every 6 (six) hours as needed., Disp: 1 Tube, Rfl: 5 .  losartan (COZAAR) 50 MG tablet, Take 1 tablet (50 mg total) by mouth daily., Disp: 90 tablet, Rfl: 1 .  metFORMIN (  GLUCOPHAGE) 500 MG tablet, Take 1 tablet (500 mg total) by mouth daily with breakfast., Disp: 30 tablet, Rfl: 5 .  Spacer/Aero-Hold Chamber Bags MISC, 1 Device by Does not apply route every 6 (six) hours as needed (with inhaler)., Disp: 1 each, Rfl: 1 .  tamsulosin (FLOMAX) 0.4 MG CAPS capsule, Take 1 capsule (0.4 mg total) by mouth daily., Disp: 30 capsule, Rfl: 5 .  umeclidinium bromide (INCRUSE ELLIPTA) 62.5 MCG/INH AEPB, Inhale 1 puff daily into the lungs., Disp: 1 each, Rfl: 6   Allergies:  Patient has no known allergies.  Review of Systems: Gen:  Denies  fever, sweats, chills HEENT: Denies blurred vision, double vision. bleeds, sore throat Cvc:  No dizziness, chest pain. Resp:   Denies cough or sputum production, shortness of breath Gi: Denies swallowing  difficulty, stomach pain. Gu:  Denies bladder incontinence, burning urine Ext:   No Joint pain, stiffness. Skin: No skin rash,  hives  Endoc:  No polyuria, polydipsia. Psych: No depression, insomnia. Other:  All other systems were reviewed with the patient and were negative other that what is mentioned in the HPI.   Physical Examination:   VS: BP 110/68 (BP Location: Left Arm, Cuff Size: Normal)   Pulse 72   Ht 5' (1.524 m)   Wt 111 lb (50.3 kg)   SpO2 97%   BMI 21.68 kg/m   General Appearance: No distress  Neuro:without focal findings,  speech normal,  HEENT: PERRLA, EOM intact.   Pulmonary: normal breath sounds, No wheezing.  CardiovascularNormal S1,S2.  No m/r/g.   Abdomen: Benign, Soft, non-tender. Renal:  No costovertebral tenderness  GU:  No performed at this time. Endoc: No evident thyromegaly, no signs of acromegaly. Skin:   warm, no rashes, no ecchymosis  Extremities: normal, no cyanosis, clubbing.  Other findings:    LABORATORY PANEL:   CBC No results for input(s): WBC, HGB, HCT, PLT in the last 168 hours. ------------------------------------------------------------------------------------------------------------------  Chemistries  No results for input(s): NA, K, CL, CO2, GLUCOSE, BUN, CREATININE, CALCIUM, MG, AST, ALT, ALKPHOS, BILITOT in the last 168 hours.  Invalid input(s): GFRCGP ------------------------------------------------------------------------------------------------------------------  Cardiac Enzymes No results for input(s): TROPONINI in the last 168 hours. ------------------------------------------------------------  RADIOLOGY:  No results found.     Thank  you for the consultation and for allowing [facility] to assist in the care of your patient. Our recommendations are noted above.  Please contact us if we can be of further service.   Marda Stalker, MD.  Board Certified in Internal Medicine, Pulmonary  Medicine, [place], and Sleep Medicine.  [facility] Office Number: [phone]  Patricia Pesa, M.D.  Merton Border, M.D  04/05/2017

## 2017-04-10 ENCOUNTER — Encounter
Admission: RE | Disposition: A | Discharge status: Home / Self Care | Payer: Self-pay | Source: Ambulatory Visit | Attending: Gastroenterology

## 2017-04-10 ENCOUNTER — Ambulatory Visit
Admission: RE | Admit: 2017-04-10 | Discharge: 2017-04-10 | Disposition: A | Discharge status: Home / Self Care | Payer: Medicare Other | Source: Ambulatory Visit | Attending: Gastroenterology | Admitting: Gastroenterology

## 2017-04-10 ENCOUNTER — Encounter: Payer: Self-pay | Admitting: *Deleted

## 2017-04-10 ENCOUNTER — Ambulatory Visit: Payer: Medicare Other | Admitting: Anesthesiology

## 2017-04-10 DIAGNOSIS — E119 Type 2 diabetes mellitus without complications: Secondary | ICD-10-CM | POA: Insufficient documentation

## 2017-04-10 DIAGNOSIS — K5730 Diverticulosis of large intestine without perforation or abscess without bleeding: Secondary | ICD-10-CM | POA: Insufficient documentation

## 2017-04-10 DIAGNOSIS — K649 Unspecified hemorrhoids: Secondary | ICD-10-CM | POA: Diagnosis not present

## 2017-04-10 DIAGNOSIS — Z87891 Personal history of nicotine dependence: Secondary | ICD-10-CM | POA: Diagnosis not present

## 2017-04-10 DIAGNOSIS — D123 Benign neoplasm of transverse colon: Secondary | ICD-10-CM | POA: Diagnosis not present

## 2017-04-10 DIAGNOSIS — D122 Benign neoplasm of ascending colon: Secondary | ICD-10-CM | POA: Diagnosis not present

## 2017-04-10 DIAGNOSIS — J449 Chronic obstructive pulmonary disease, unspecified: Secondary | ICD-10-CM | POA: Diagnosis not present

## 2017-04-10 DIAGNOSIS — Z7984 Long term (current) use of oral hypoglycemic drugs: Secondary | ICD-10-CM | POA: Diagnosis not present

## 2017-04-10 DIAGNOSIS — Z1211 Encounter for screening for malignant neoplasm of colon: Secondary | ICD-10-CM | POA: Diagnosis not present

## 2017-04-10 DIAGNOSIS — D125 Benign neoplasm of sigmoid colon: Secondary | ICD-10-CM | POA: Diagnosis not present

## 2017-04-10 DIAGNOSIS — K640 First degree hemorrhoids: Secondary | ICD-10-CM | POA: Insufficient documentation

## 2017-04-10 DIAGNOSIS — K5790 Diverticulosis of intestine, part unspecified, without perforation or abscess without bleeding: Secondary | ICD-10-CM | POA: Diagnosis not present

## 2017-04-10 DIAGNOSIS — I10 Essential (primary) hypertension: Secondary | ICD-10-CM | POA: Diagnosis not present

## 2017-04-10 DIAGNOSIS — Z79899 Other long term (current) drug therapy: Secondary | ICD-10-CM | POA: Insufficient documentation

## 2017-04-10 DIAGNOSIS — K635 Polyp of colon: Secondary | ICD-10-CM | POA: Diagnosis not present

## 2017-04-10 HISTORY — PX: COLONOSCOPY WITH PROPOFOL: SHX5780

## 2017-04-10 LAB — GLUCOSE, CAPILLARY: GLUCOSE-CAPILLARY: 95 mg/dL (ref 65–99)

## 2017-04-10 SURGERY — COLONOSCOPY WITH PROPOFOL
Anesthesia: General

## 2017-04-10 MED ORDER — PROPOFOL 500 MG/50ML IV EMUL
INTRAVENOUS | Status: AC
  Filled 2017-04-10: qty 50

## 2017-04-10 MED ORDER — PROPOFOL 500 MG/50ML IV EMUL
INTRAVENOUS | Status: DC | PRN
  Administered 2017-04-10: 150 ug/kg/min via INTRAVENOUS

## 2017-04-10 MED ORDER — PROPOFOL 10 MG/ML IV BOLUS
INTRAVENOUS | Status: DC | PRN
  Administered 2017-04-10: 20 mg via INTRAVENOUS
  Administered 2017-04-10: 40 mg via INTRAVENOUS
  Administered 2017-04-10: 20 mg via INTRAVENOUS

## 2017-04-10 MED ORDER — LIDOCAINE HCL (CARDIAC) 20 MG/ML IV SOLN
INTRAVENOUS | Status: DC | PRN
  Administered 2017-04-10: 50 mg via INTRAVENOUS

## 2017-04-10 MED ORDER — SODIUM CHLORIDE 0.9 % IV SOLN
INTRAVENOUS | Status: DC | PRN
  Administered 2017-04-10: 11:00:00 via INTRAVENOUS

## 2017-04-10 NOTE — Anesthesia Post-op Follow-up Note (Signed)
Anesthesia QCDR form completed.        

## 2017-04-10 NOTE — Anesthesia Preprocedure Evaluation (Signed)
Anesthesia Evaluation  Patient identified by MRN, date of birth, ID band Patient awake    Reviewed: Allergy & Precautions, NPO status , Patient's Chart, lab work & pertinent test results  History of Anesthesia Complications Negative for: history of anesthetic complications  Airway Mallampati: III       Dental  (+) Partial Upper   Pulmonary neg sleep apnea, COPD,  COPD inhaler, former smoker,           Cardiovascular hypertension, Pt. on medications (-) Past MI and (-) CHF (-) dysrhythmias (-) Valvular Problems/Murmurs     Neuro/Psych neg Seizures    GI/Hepatic Neg liver ROS, neg GERD  ,  Endo/Other  diabetes, Type 2, Oral Hypoglycemic Agents  Renal/GU negative Renal ROS     Musculoskeletal   Abdominal   Peds  Hematology   Anesthesia Other Findings   Reproductive/Obstetrics                             Anesthesia Physical Anesthesia Plan  ASA: III  Anesthesia Plan: General   Post-op Pain Management:    Induction: Intravenous  PONV Risk Score and Plan: 2 and TIVA and Propofol infusion  Airway Management Planned: Nasal Cannula  Additional Equipment:   Intra-op Plan:   Post-operative Plan:   Informed Consent: I have reviewed the patients History and Physical, chart, labs and discussed the procedure including the risks, benefits and alternatives for the proposed anesthesia with the patient or authorized representative who has indicated his/her understanding and acceptance.     Plan Discussed with:   Anesthesia Plan Comments:         Anesthesia Quick Evaluation

## 2017-04-10 NOTE — Progress Notes (Signed)
Interpreter used Normand Sloop [phone]

## 2017-04-10 NOTE — Anesthesia Postprocedure Evaluation (Signed)
Anesthesia Post Note  Patient: Jonathan Willis  Procedure(s) Performed: COLONOSCOPY WITH PROPOFOL (N/A )  Patient location during evaluation: Endoscopy Anesthesia Type: General Level of consciousness: awake and alert Pain management: pain level controlled Vital Signs Assessment: post-procedure vital signs reviewed and stable Respiratory status: spontaneous breathing and respiratory function stable Cardiovascular status: stable Anesthetic complications: no     Last Vitals:  Vitals:   04/10/17 1030 04/10/17 1200  BP: (!) 151/70 98/61  Pulse: 76 84  Resp: 16 15  Temp: 36.6 C (!) 36 C  SpO2: 100% 100%    Last Pain:  Vitals:   04/10/17 1200  TempSrc: Tympanic  PainSc:                  [name] K

## 2017-04-10 NOTE — H&P (Signed)
Jonathan Bellows, MD [address], [place], [place], [place], [address] [address], [place], [place], [place], [address] Phone: [phone]  Fax: [phone]  Primary Care Physician:  Mikey College, NP   Pre-Procedure History & Physical: HPI:  Jonathan Willis is a 72 y.o. male is here for an colonoscopy.   Past Medical History:  Diagnosis Date  . Chronic cough 01/16/2017  . Diabetes mellitus without complication ([place])   . External hemorrhoid   . 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 History of TB (tuberculosis)   . Hypertension   . Lower GI bleed     Past Surgical History:  Procedure Laterality Date  . NO PAST SURGERIES      Prior to Admission medications   Medication Sig Start Date End Date Taking? Authorizing Provider  albuterol (PROVENTIL HFA;VENTOLIN HFA) 108 (90 Base) MCG/ACT inhaler Inhale 2 puffs into the lungs every 6 (six) hours as needed for wheezing or shortness of breath. 01/23/17  Yes Epifanio Lesches, MD  atorvastatin (LIPITOR) 20 MG tablet Take 1 tablet (20 mg total) by mouth daily. 01/11/17  Yes Mikey College, NP  budesonide-formoterol [facility]) 80-4.5 MCG/ACT inhaler Inhale 2 puffs into the lungs 2 (two) times daily. 03/24/17  Yes Mikey College, NP  hydrochlorothiazide (HYDRODIURIL) 12.5 MG tablet Take 1 tablet (12.5 mg total) by mouth daily. 01/10/17  Yes Mikey College, NP  Lidocaine HCl 3 % LOTN Apply 1 application topically every 6 (six) hours as needed. 01/10/17  Yes Mikey College, NP  losartan (COZAAR) 50 MG tablet Take 1 tablet (50 mg total) by mouth daily. 03/24/17  Yes Mikey College, NP  metFORMIN (GLUCOPHAGE) 500 MG tablet Take 1 tablet (500 mg total) by mouth daily with breakfast. 01/11/17  Yes Mikey College, NP  Spacer/Aero-Hold Chamber Bags MISC 1 Device by Does not apply route every 6 (six) hours as needed (with inhaler). 01/10/17   Yes Mikey College, NP  tamsulosin (FLOMAX) 0.4 MG CAPS capsule Take 1 capsule (0.4 mg total) by mouth daily. 03/13/17  Yes Mikey College, NP  umeclidinium bromide (INCRUSE ELLIPTA) 62.5 MCG/INH AEPB Inhale 1 puff daily into the lungs. 02/02/17  Yes Mikey College, NP    Allergies as of 04/04/2017  . (No Known Allergies)    Family History  Problem Relation Age of Onset  . COPD Neg Hx   . Diabetes Mellitus II Neg Hx   . Hypertension Neg Hx     Social History   Socioeconomic History  . Marital status: Single    Spouse name: Not on file  . Number of children: Not on file  . Years of education: 60  . Highest education level: 9th grade  Social Needs  . Financial resource strain: Not hard at all  . Food insecurity - worry: Never true  . Food insecurity - inability: Never true  . Transportation needs - medical: No  . Transportation needs - non-medical: No  Occupational History  . Occupation: retired  Tobacco Use  . Smoking status: Former Smoker    Last attempt to quit: 01/11/2007    Years since quitting: 10.2  . Smokeless tobacco: Never Used  Substance and Sexual Activity  . Alcohol use: No  . Drug use: No  . Sexual activity: No  Other Topics Concern  . Not on file  Social History Narrative  . Not on file  Review of Systems: See HPI, otherwise negative ROS  Physical Exam: BP (!) 151/70 (BP Location: Right Arm)   Pulse 76   Temp 97.9 F (36.6 C)   Resp 16   SpO2 100%  General:   Alert,  pleasant and cooperative in NAD Head:  Normocephalic and atraumatic. Neck:  Supple; no masses or thyromegaly. Lungs:  Clear throughout to auscultation, normal respiratory effort.    Heart:  +S1, +S2, Regular rate and rhythm, No edema. Abdomen:  Soft, nontender and nondistended. Normal bowel sounds, without guarding, and without rebound.   Neurologic:  Alert and  oriented x4;  grossly normal neurologically.  Impression/Plan: Jonathan Willis is here for an  colonoscopy to be performed for Screening colonoscopy average risk   Risks, benefits, limitations, and alternatives regarding  colonoscopy have been reviewed with the patient.  Questions have been answered.  All parties agreeable.   Jonathan Bellows, MD  04/10/2017, 11:24 AM

## 2017-04-10 NOTE — Anesthesia Procedure Notes (Signed)
Date/Time: 04/10/2017 11:28 AM Performed by: Johnna Acosta, CRNA Pre-anesthesia Checklist: Patient identified, Emergency Drugs available, Suction available, Patient being monitored and Timeout performed Patient Re-evaluated:Patient Re-evaluated prior to induction Oxygen Delivery Method: Nasal cannula Preoxygenation: Pre-oxygenation with 100% oxygen

## 2017-04-10 NOTE — Transfer of Care (Signed)
Immediate Anesthesia Transfer of Care Note  Patient: Armond Cuthrell  Procedure(s) Performed: COLONOSCOPY WITH PROPOFOL (N/A )  Patient Location: PACU  Anesthesia Type:General  Level of Consciousness: sedated  Airway & Oxygen Therapy: Patient Spontanous Breathing and Patient connected to nasal cannula oxygen  Post-op Assessment: Report given to RN and Post -op Vital signs reviewed and stable  Post vital signs: Reviewed and stable  Last Vitals:  Vitals:   04/10/17 1030 04/10/17 1200  BP: (!) 151/70 98/61  Pulse: 76 84  Resp: 16 15  Temp: 36.6 C (!) 36 C  SpO2: 100% 100%    Last Pain:  Vitals:   04/10/17 1200  TempSrc: Tympanic  PainSc:          Complications: No apparent anesthesia complications

## 2017-04-10 NOTE — Op Note (Signed)
[facility] Patient Name: Jonathan Willis Procedure Date: 04/10/2017 11:27 AM MRN: [id number] Account #: [id number] Date of Birth: 1945-09-22 Admit Type: Outpatient Age: 72 Room: [facility] ENDO ROOM 4 Gender: Male Note Status: Finalized Procedure:            Colonoscopy Indications:          Screening for colorectal malignant neoplasm Providers:            [name] MD, MD Referring MD:         No Local Md, MD (Referring MD) Medicines:            Monitored Anesthesia Care Complications:        No immediate complications. Procedure:            Pre-Anesthesia Assessment:                       - Prior to the procedure, a History and Physical was                        performed, and patient medications, allergies and                        sensitivities were reviewed. The patient's tolerance of                        previous anesthesia was reviewed.                       - The risks and benefits of the procedure and the                        sedation options and risks were discussed with the                        patient. All questions were answered and informed                        consent was obtained.                       - ASA Grade Assessment: III - A patient with severe                        systemic disease.                       After obtaining informed consent, the colonoscope was                        passed under direct vision. Throughout the procedure,                        the patient's blood pressure, pulse, and oxygen                        saturations were monitored continuously. The                        Colonoscope was introduced through the anus and  advanced to the the cecum, identified by the                        appendiceal orifice, IC valve and transillumination.                        The colonoscopy was performed with ease. The patient                        tolerated the procedure well. The quality of  the bowel                        preparation was good. Findings:      The perianal and digital rectal examinations were normal.      Multiple small-mouthed diverticula were found in the entire colon.      Non-bleeding internal hemorrhoids were found during retroflexion. The       hemorrhoids were medium-sized and Grade I (internal hemorrhoids that do       not prolapse).      Two sessile polyps were found in the sigmoid colon. The polyps were 7 to       9 mm in size. These polyps were removed with a hot snare. Resection and       retrieval were complete.      A 3 mm polyp was found in the transverse colon. The polyp was sessile.       The polyp was removed with a cold biopsy forceps. Resection and       retrieval were complete.      Three sessile polyps were found in the ascending colon. The polyps were       7 to 10 mm in size. These polyps were removed with a hot snare.       Resection and retrieval were complete.      The exam was otherwise without abnormality on direct and retroflexion       views. Impression:           - Diverticulosis in the entire examined colon.                       - Non-bleeding internal hemorrhoids.                       - Two 7 to 9 mm polyps in the sigmoid colon, removed                        with a hot snare. Resected and retrieved.                       - One 3 mm polyp in the transverse colon, removed with                        a cold biopsy forceps. Resected and retrieved.                       - Three 7 to 10 mm polyps in the ascending colon,                        removed with a hot snare. Resected and retrieved.                       -  The examination was otherwise normal on direct and                        retroflexion views. Recommendation:       - Discharge patient to home (with escort).                       - Resume previous diet.                       - Continue present medications.                       - Await pathology results.                        - Repeat colonoscopy in 3 years for surveillance. Procedure Code(s):    --- Professional ---                       [phone], Colonoscopy, flexible; with removal of tumor(s),                        polyp(s), or other lesion(s) by snare technique                       [id number], 55, Colonoscopy, flexible; with biopsy, single                        or multiple Diagnosis Code(s):    --- Professional ---                       Z12.11, Encounter for screening for malignant neoplasm                        of colon                       K64.0, First degree hemorrhoids                       D12.5, Benign neoplasm of sigmoid colon                       D12.2, Benign neoplasm of ascending colon                       D12.3, Benign neoplasm of transverse colon (hepatic                        flexure or splenic flexure)                       K57.30, Diverticulosis of large intestine without                        perforation or abscess without bleeding CPT copyright 2016 American Medical Association. All rights reserved. The codes documented in this report are preliminary and upon coder review may  be revised to meet current compliance requirements. [name], MD [name] MD, MD 04/10/2017 11:57:18 AM This report has been signed electronically. Number of Addenda: 0 Note Initiated On: 04/10/2017 11:27 AM Scope Withdrawal Time: 0 hours 17 minutes 59 seconds  Total  Procedure Duration: 0 hours 22 minutes 5 seconds       [facility]

## 2017-04-11 ENCOUNTER — Encounter: Payer: Self-pay | Admitting: Gastroenterology

## 2017-04-11 LAB — SURGICAL PATHOLOGY

## 2017-04-16 ENCOUNTER — Encounter: Payer: Self-pay | Admitting: Gastroenterology

## 2017-05-04 ENCOUNTER — Other Ambulatory Visit: Payer: Self-pay | Admitting: Nurse Practitioner

## 2017-05-04 DIAGNOSIS — J431 Panlobular emphysema: Secondary | ICD-10-CM

## 2017-05-04 MED ORDER — ALBUTEROL SULFATE HFA 108 (90 BASE) MCG/ACT IN AERS: 2.0000 | INHALATION_SPRAY | Freq: Four times a day (QID) | RESPIRATORY_TRACT | 2 refills | Status: DC | PRN

## 2017-06-02 ENCOUNTER — Encounter: Payer: Self-pay | Admitting: Emergency Medicine

## 2017-06-02 ENCOUNTER — Other Ambulatory Visit: Payer: Self-pay

## 2017-06-02 DIAGNOSIS — Z7984 Long term (current) use of oral hypoglycemic drugs: Secondary | ICD-10-CM | POA: Diagnosis not present

## 2017-06-02 DIAGNOSIS — R42 Dizziness and giddiness: Secondary | ICD-10-CM | POA: Diagnosis not present

## 2017-06-02 DIAGNOSIS — Z87891 Personal history of nicotine dependence: Secondary | ICD-10-CM | POA: Insufficient documentation

## 2017-06-02 DIAGNOSIS — J449 Chronic obstructive pulmonary disease, unspecified: Secondary | ICD-10-CM | POA: Diagnosis not present

## 2017-06-02 DIAGNOSIS — I6523 Occlusion and stenosis of bilateral carotid arteries: Secondary | ICD-10-CM | POA: Diagnosis not present

## 2017-06-02 DIAGNOSIS — J984 Other disorders of lung: Secondary | ICD-10-CM | POA: Diagnosis not present

## 2017-06-02 DIAGNOSIS — R51 Headache: Secondary | ICD-10-CM | POA: Diagnosis not present

## 2017-06-02 DIAGNOSIS — I6502 Occlusion and stenosis of left vertebral artery: Secondary | ICD-10-CM | POA: Diagnosis not present

## 2017-06-02 DIAGNOSIS — I10 Essential (primary) hypertension: Secondary | ICD-10-CM | POA: Diagnosis not present

## 2017-06-02 DIAGNOSIS — E119 Type 2 diabetes mellitus without complications: Secondary | ICD-10-CM | POA: Diagnosis not present

## 2017-06-02 LAB — URINALYSIS, COMPLETE (UACMP) WITH MICROSCOPIC
BILIRUBIN URINE: NEGATIVE
Bacteria, UA: NONE SEEN
GLUCOSE, UA: NEGATIVE mg/dL
HGB URINE DIPSTICK: NEGATIVE
KETONES UR: NEGATIVE mg/dL
LEUKOCYTES UA: NEGATIVE
NITRITE: NEGATIVE
PH: 6 (ref 5.0–8.0)
Protein, ur: NEGATIVE mg/dL
Specific Gravity, Urine: 1.014 (ref 1.005–1.030)
Squamous Epithelial / LPF: NONE SEEN

## 2017-06-02 LAB — BASIC METABOLIC PANEL
Anion gap: 10 (ref 5–15)
BUN: 23 mg/dL — AB (ref 6–20)
CALCIUM: 9.3 mg/dL (ref 8.9–10.3)
CO2: 24 mmol/L (ref 22–32)
CREATININE: 0.96 mg/dL (ref 0.61–1.24)
Chloride: 100 mmol/L — ABNORMAL LOW (ref 101–111)
Glucose, Bld: 120 mg/dL — ABNORMAL HIGH (ref 65–99)
Potassium: 3.9 mmol/L (ref 3.5–5.1)
SODIUM: 134 mmol/L — AB (ref 135–145)

## 2017-06-02 LAB — CBC
HCT: 39.4 % — ABNORMAL LOW (ref 40.0–52.0)
Hemoglobin: 13.7 g/dL (ref 13.0–18.0)
MCH: 32 pg (ref 26.0–34.0)
MCHC: 34.6 g/dL (ref 32.0–36.0)
MCV: 92.5 fL (ref 80.0–100.0)
PLATELETS: 227 10*3/uL (ref 150–440)
RBC: 4.26 MIL/uL — AB (ref 4.40–5.90)
RDW: 12.9 % (ref 11.5–14.5)
WBC: 7 10*3/uL (ref 3.8–10.6)

## 2017-06-02 NOTE — ED Triage Notes (Signed)
Pt arrives to triage with c/o dizziness upon standing up and HA x3 days. Pt denies N/V but states that the episodes last around 5 minutes each. Pt is in NAD. Triage completed via [employer] interpreter.

## 2017-06-03 ENCOUNTER — Emergency Department
Admission: EM | Admit: 2017-06-03 | Discharge: 2017-06-03 | Disposition: A | Discharge status: Home / Self Care | Payer: Medicare Other | Attending: Emergency Medicine | Admitting: Emergency Medicine

## 2017-06-03 ENCOUNTER — Emergency Department: Payer: Medicare Other

## 2017-06-03 DIAGNOSIS — R42 Dizziness and giddiness: Secondary | ICD-10-CM

## 2017-06-03 DIAGNOSIS — I6523 Occlusion and stenosis of bilateral carotid arteries: Secondary | ICD-10-CM | POA: Diagnosis not present

## 2017-06-03 DIAGNOSIS — I6502 Occlusion and stenosis of left vertebral artery: Secondary | ICD-10-CM

## 2017-06-03 DIAGNOSIS — J984 Other disorders of lung: Secondary | ICD-10-CM | POA: Diagnosis not present

## 2017-06-03 LAB — TROPONIN I: Troponin I: <0.03 ng/mL (ref <0.03)

## 2017-06-03 IMAGING — CT CT ANGIO NECK
1 of 11 series · 5 of 33 positions shown · IV contrast (APPLIED)
Comparison: None.

CLINICAL DATA: Initial evaluation for acute dizziness with
standing, headache.

EXAM:
CT ANGIOGRAPHY HEAD AND NECK
TECHNIQUE: Multidetector CT imaging of the head and neck was performed using
the standard protocol during bolus administration of intravenous
contrast. Multiplanar CT image reconstructions and MIPs were
obtained to evaluate the vascular anatomy. Carotid stenosis
measurements (when applicable) are obtained utilizing NASCET
criteria, using the distal internal carotid diameter as the
denominator.
CONTRAST:  75mL [W3] IOPAMIDOL ([W3]) INJECTION 76%

[Series 10: ax thin · axial · 0.32mm/px · z∈[-284,-52]mm · 5 of 348 slices shown]
[im 58/348  soft-tissue]
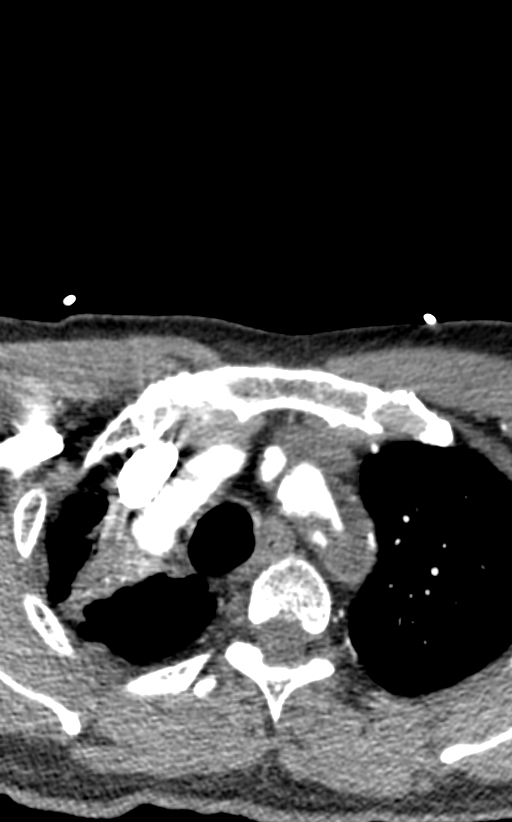
[im 116/348  bone]
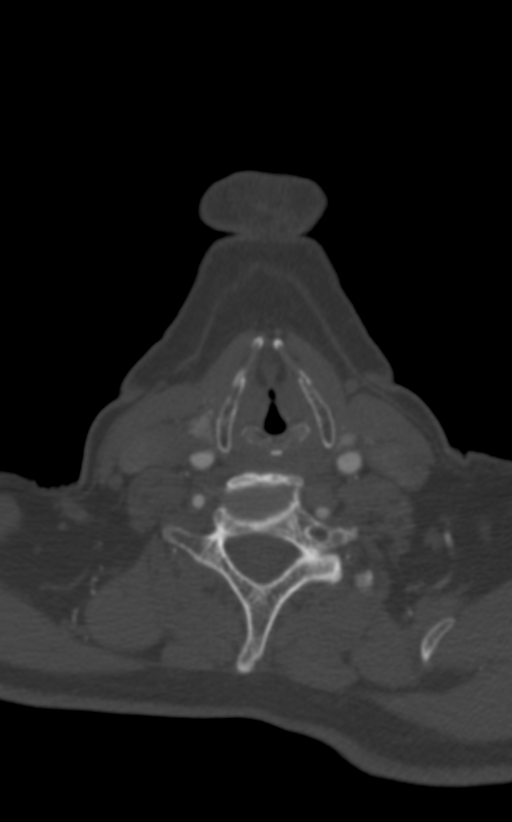
[im 174/348  soft-tissue]
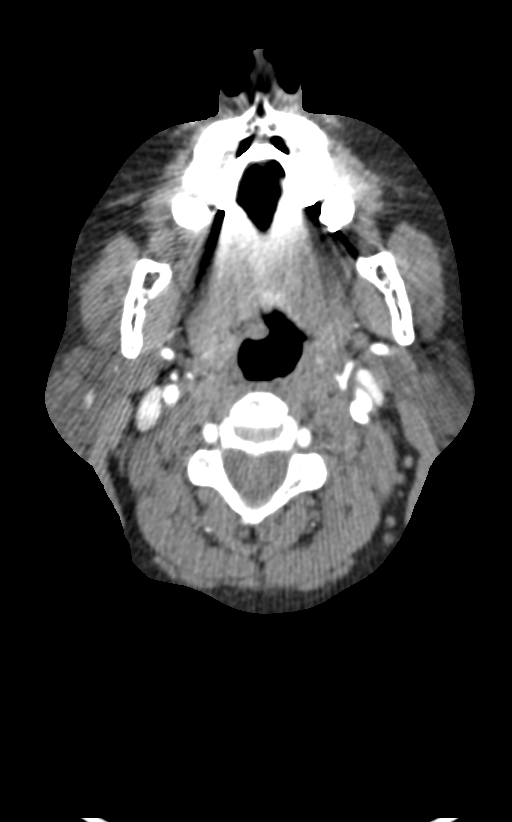
[im 232/348  bone]
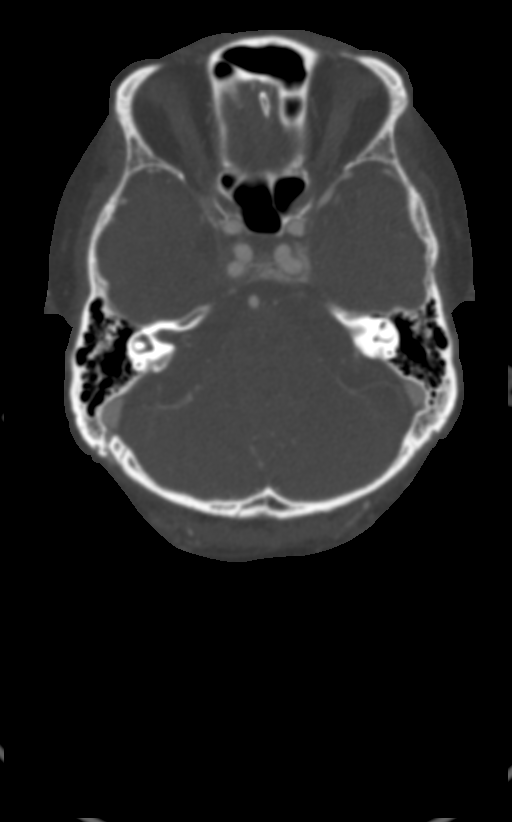
[im 290/348  soft-tissue]
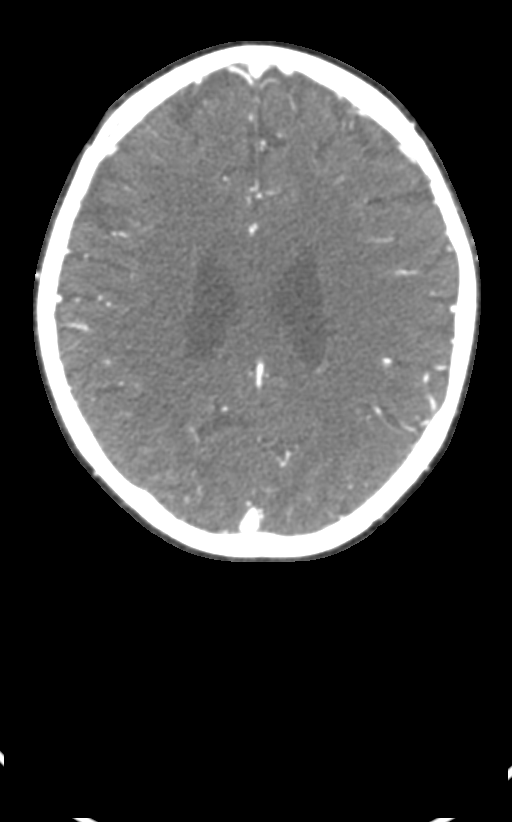

[5 of 33 positions shown; findings below may reference images not displayed]

FINDINGS: CT HEAD FINDINGS

Brain: Mildly advanced cerebral atrophy with chronic small vessel
ischemic disease. No acute intracranial hemorrhage. No acute large
vessel territory infarct. No mass lesion, midline shift or mass
effect. No hydrocephalus. No extra-axial fluid collection.

Vascular: No hyperdense vessel. Scattered vascular calcifications
noted within the carotid siphons.

Skull: Scalp soft tissues and calvarium within normal limits.

Sinuses: Paranasal sinuses mastoids are clear.

Orbits: Globes normal soft tissues within normal limits.

Review of the MIP images confirms the above findings

CTA NECK FINDINGS

Aortic arch: Visualized aortic arch of normal caliber with normal
branch pattern. Extensive noncalcified mural plaque within the
visualized arch. No high-grade stenosis about the origin of the
great vessels. Focal penetrating plaque noted at the proximal left
subclavian artery (series 10, image 267) secondary mild to moderate
stenosis of the adjacent true lumen. Visualized subclavian arteries
otherwise widely patent.

Right carotid system: Fairly long segment eccentric noncalcified
plaque throughout the right common carotid artery with mild diffuse
narrowing. Concentric mixed plaque about the right
bifurcation/proximal right ICA with associated narrowing of up to
40% by NASCET criteria. Right ICA widely patent distally to the
skull base without stenosis, dissection, or occlusion.

Left carotid system: Eccentric noncalcified plaque throughout the
left common carotid artery with mild multifocal narrowing.
Concentric mixed plaque about the left carotid bifurcation/proximal
left ICA. Associated relatively mild narrowing of approximately
20-30% by NASCET criteria. 1 cm calcified granuloma present within
the left upper lobe. There is irregular linear consolidative opacity
with calcifications at the left lung apex ICA patent distally to the
skull base without stenosis, dissection or occlusion.

Vertebral arteries: Both of the vertebral arteries arise from the
subclavian arteries. Focal severe stenosis at the origin of the left
vertebral artery (series 10, image 253). More moderate narrowing at
the origin of the right vertebral artery (series 10, image 256).
Vertebral arteries otherwise widely patent within the neck.

Skeleton: No acute osseous abnormality. No worrisome lytic or
blastic osseous lesions. Mild to moderate cervical spondylolysis,
most notable at C5-6.

Other neck: No acute soft tissue abnormality within the neck.
Salivary glands normal. No adenopathy. Thyroid normal.

Upper chest: Visualized upper mediastinum demonstrates no acute
abnormality. Large 1 cm calcified granuloma at the right upper lobe.
Irregular architectural distortion with volume loss and band like
opacity at the right lung apex. Associated scattered calcifications.
Finding suspected to reflect sequelae of prior granulomatous
infection, similar to previous radiographs. Mild irregular apical
thickening with calcification noted at the left lung apex as well.
Visualized lungs are otherwise clear.

Review of the MIP images confirms the above findings

CTA HEAD FINDINGS

Anterior circulation: Internal carotid arteries are widely patent to
the termini without flow-limiting stenosis. A1 segments, anterior
communicating artery common anterior cerebral arteries widely
patent. M1 segments widely patent bilaterally. Distal MCA branches
well perfused and symmetric.

Posterior circulation: Vertebral arteries widely patent to the
vertebrobasilar junction. Posterior inferior cerebral arteries
patent bilaterally. Basilar artery widely patent to its distal
aspect without stenosis. Superior cerebral arteries patent
bilaterally. Right PCA supplied via the basilar. Hypoplastic left P1
segment with prominent left posterior communicating artery. PCAs
patent to their distal aspects without flow-limiting stenosis.

Venous sinuses: Patent.

Anatomic variants: None significant. No aneurysm or vascular
abnormality.

Delayed phase: No pathologic enhancement.

Review of the MIP images confirms the above findings
IMPRESSION: 1. Negative CTA for large vessel occlusion. No acute vascular
abnormality identified.
2. Atheromatous plaque about the carotid bifurcations bilaterally,
with associated narrowing of up to 40% on the right and mild 20-30%
on the left.
3. Atheromatous plaque at the origin of the vertebral arteries
bilaterally with secondary moderate to severe stenoses, left worse
than right. Otherwise widely patent vertebrobasilar system.
4. Architectural distortion with scarring at the right lung apex,
most likely related to prior granulomatous infection.

## 2017-06-03 IMAGING — DX DG CHEST 1V PORT
1 series · 2 of 2 positions shown · non-contrast
Comparison: Chest radiograph performed [DATE], and CT of the
chest performed [DATE]

CLINICAL DATA: Acute onset of dizziness and headache.

EXAM:
PORTABLE CHEST 1 VIEW

[Series 1: chest ap · 0.14mm/px · 2 of 2 slices shown]
[im 1/2]
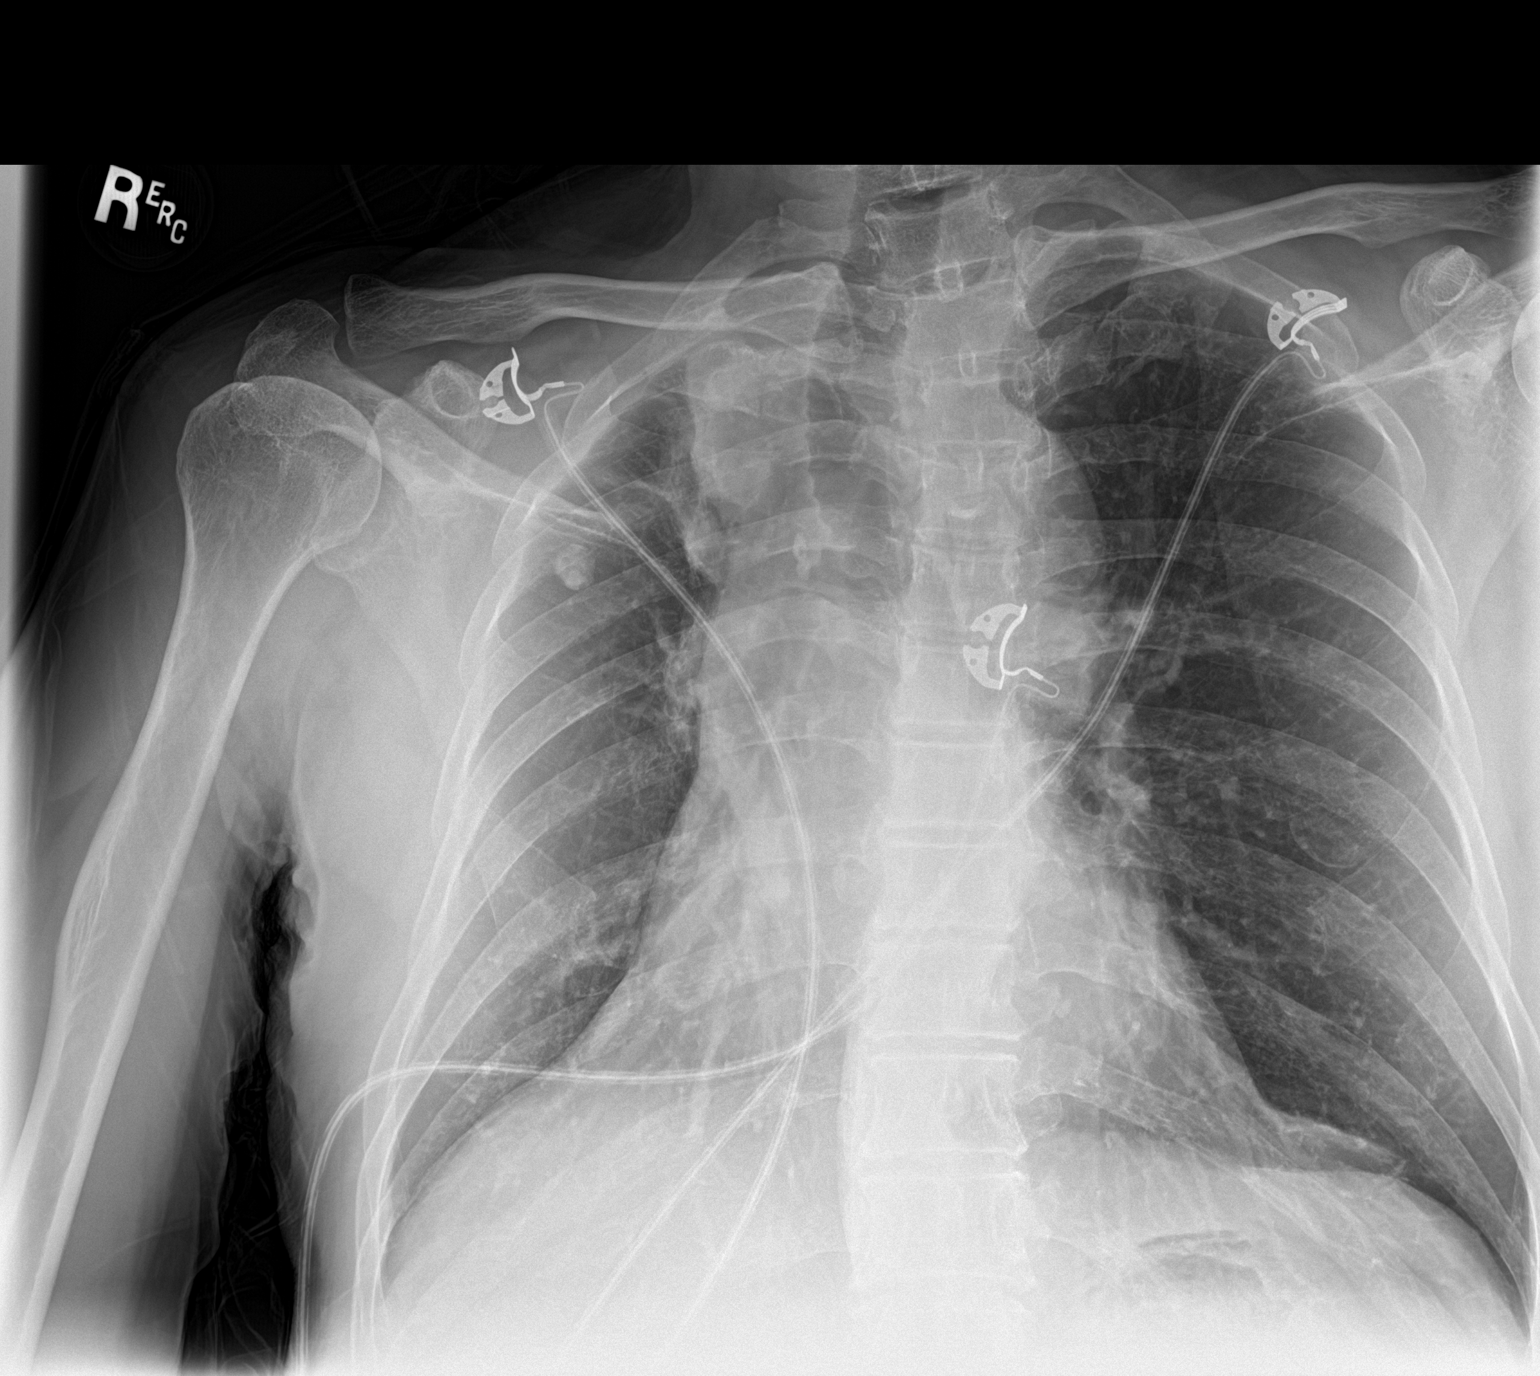
[im 2/2]
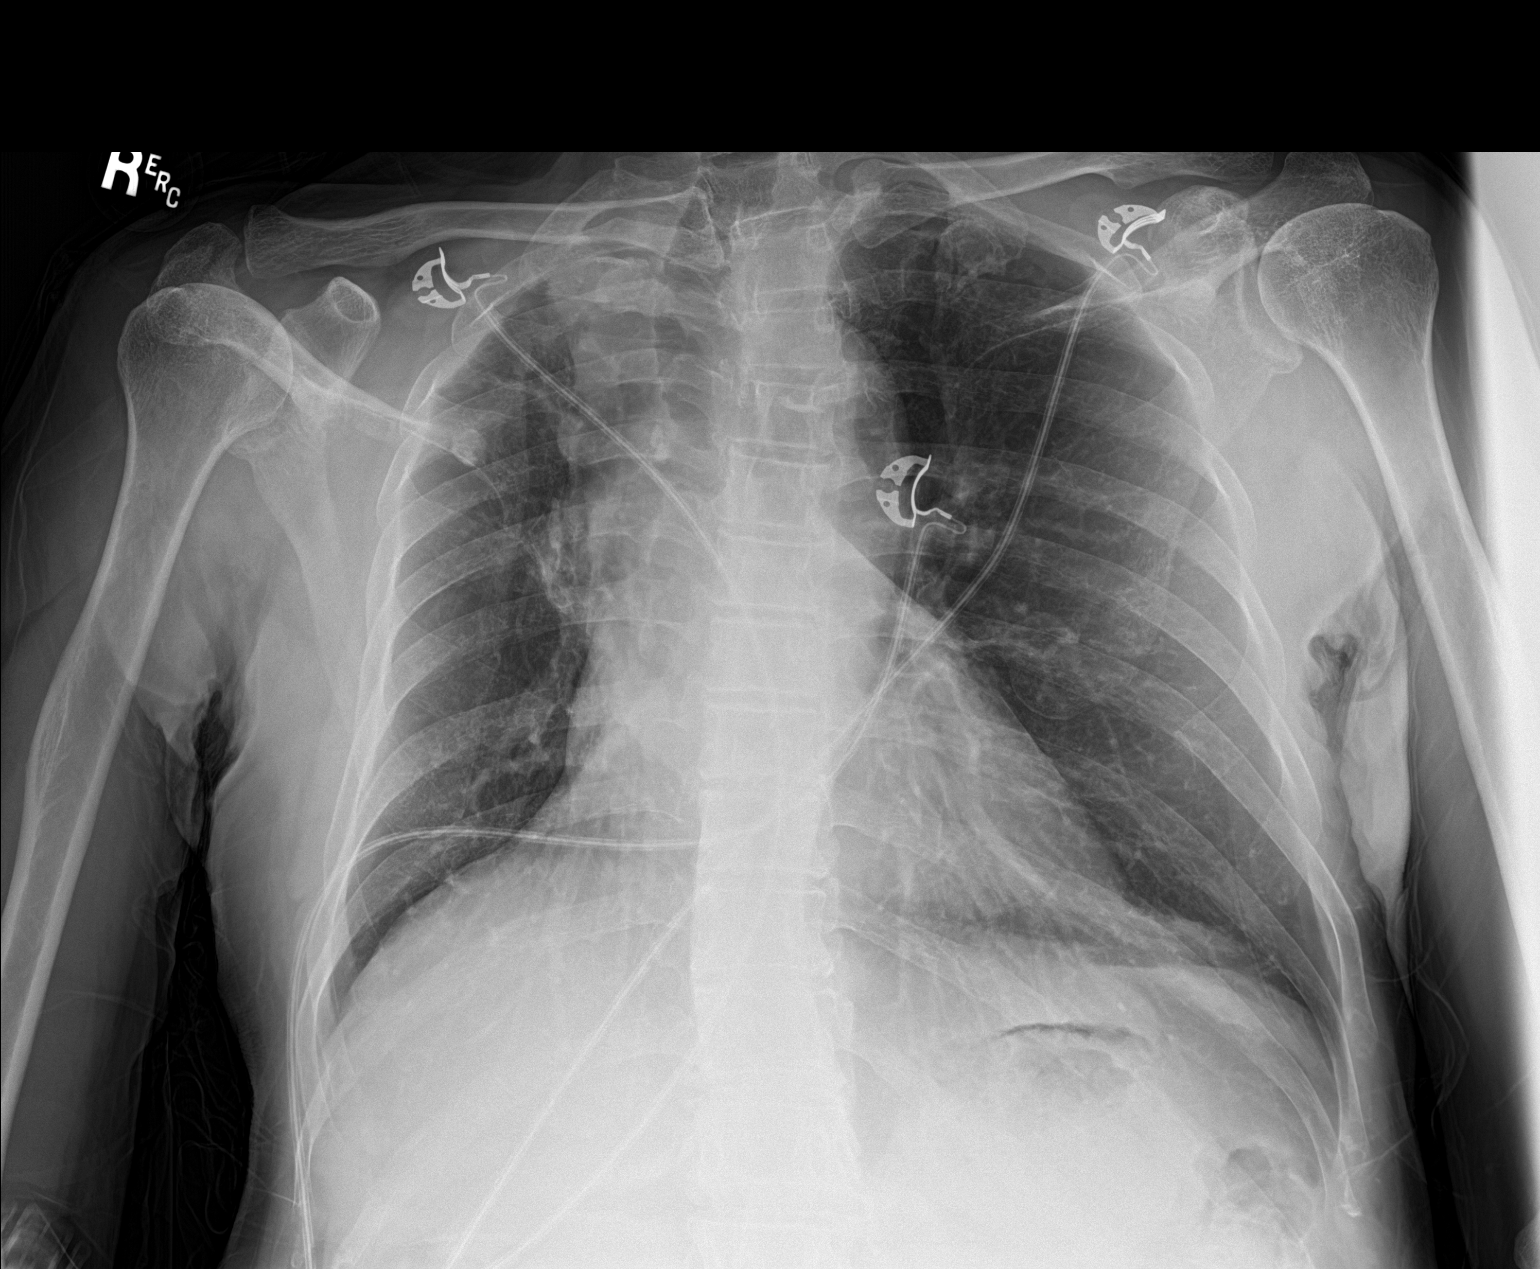

[2 of 2 positions shown; findings below may reference images not displayed]

FINDINGS: The lungs are well-aerated. Scarring is again noted at the right
lung apex, with chronic volume loss. There is no evidence of pleural
effusion or pneumothorax.

The cardiomediastinal silhouette is within normal limits. No acute
osseous abnormalities are seen. There is stable chronic prominence
of the right paratracheal soft tissues, reflecting normal
vasculature.
IMPRESSION: Chronic changes at the right lung apex. No acute cardiopulmonary
process seen.

## 2017-06-03 MED ORDER — IOPAMIDOL (ISOVUE-370) INJECTION 76%
75.0000 mL | Freq: Once | INTRAVENOUS | Status: AC | PRN
  Administered 2017-06-03: 75 mL via INTRAVENOUS

## 2017-06-03 MED ORDER — SODIUM CHLORIDE 0.9 % IV BOLUS (SEPSIS)
1000.0000 mL | Freq: Once | INTRAVENOUS | Status: AC
  Administered 2017-06-03: 1000 mL via INTRAVENOUS

## 2017-06-03 MED ORDER — ASPIRIN EC 81 MG PO TBEC
81.0000 mg | DELAYED_RELEASE_TABLET | Freq: Once | ORAL | Status: AC
  Administered 2017-06-03: 81 mg via ORAL
  Filled 2017-06-03: qty 1

## 2017-06-03 MED ORDER — MECLIZINE HCL 25 MG PO TABS: 25.0000 mg | ORAL_TABLET | Freq: Three times a day (TID) | ORAL | 0 refills | Status: DC | PRN

## 2017-06-03 MED ORDER — MECLIZINE HCL 25 MG PO TABS
25.0000 mg | ORAL_TABLET | Freq: Once | ORAL | Status: AC
Start: 2017-06-03 — End: 2017-06-03
  Administered 2017-06-03: 25 mg via ORAL
  Filled 2017-06-03: qty 1

## 2017-06-03 NOTE — Discharge Instructions (Signed)
1.  Take a baby aspirin daily. 2.  You may take meclizine as needed for dizziness. 3.  Return to the ER for worsening symptoms, persistent vomiting, difficulty breathing or other concerns.

## 2017-06-03 NOTE — ED Notes (Signed)
[employer] interpreter used for pt assessment

## 2017-06-03 NOTE — ED Provider Notes (Signed)
[facility] Emergency Department Provider Note   ____________________________________________   First MD Initiated Contact with Patient 06/03/17 0139     (approximate)  I have reviewed the triage vital signs and the nursing notes.   HISTORY  Chief Complaint Headache and Dizziness  History obtained via [place] interpreter  HPI Jonathan Willis is a 72 y.o. male who presents to the ED from home with a chief complaint of headache and dizziness.  Patient reports a 3-day history of constant right parietal headache with pain shooting down his neck and into his right arm and leg.  Denies extremity weakness, numbness or tingling.  Also complains of motion like dizziness which is worse on standing.  Denies associated visual changes or photophobia.  Denies slurred speech, facial droop or confusion.  Denies recent fever, chills, chest pain, shortness of breath, abdominal pain, nausea, vomiting or diarrhea.  Denies recent travel or trauma.  Has not tried anything to alleviate his headache.   Past Medical History:  Diagnosis Date  . Chronic cough 01/16/2017  . Diabetes mellitus without complication ([place])   . External hemorrhoid   . 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 History of TB (tuberculosis)   . Hypertension   . Lower GI bleed     Patient Active Problem List   Diagnosis Date Noted  . Benign prostatic hyperplasia with incomplete bladder emptying 03/23/2017  . Chronic right shoulder pain 01/16/2017  . Essential hypertension 01/16/2017  . Type 2 diabetes mellitus without complication, without long-term current use of insulin ([place]) 01/16/2017  . Screening for hyperlipidemia 01/16/2017  . COPD (chronic obstructive pulmonary disease) ([place]) 01/16/2017    Past Surgical History:  Procedure Laterality Date  . COLONOSCOPY WITH PROPOFOL N/A 04/10/2017   Procedure: COLONOSCOPY WITH PROPOFOL;  Surgeon: [name], MD;  Location: [facility] ENDOSCOPY;  Service: Gastroenterology;  Laterality: N/A;  . NO PAST SURGERIES      Prior to Admission medications   Medication Sig Start Date End Date Taking? Authorizing Provider  albuterol (PROVENTIL HFA;VENTOLIN HFA) 108 (90 Base) MCG/ACT inhaler Inhale 2 puffs into the lungs every 6 (six) hours as needed for wheezing or shortness of breath. 05/04/17   Mikey College, NP  atorvastatin (LIPITOR) 20 MG tablet Take 1 tablet (20 mg total) by mouth daily. 01/11/17   Mikey College, NP  budesonide-formoterol [facility]) 80-4.5 MCG/ACT inhaler Inhale 2 puffs into the lungs 2 (two) times daily. 03/24/17   Mikey College, NP  hydrochlorothiazide (HYDRODIURIL) 12.5 MG tablet Take 1 tablet (12.5 mg total) by mouth daily. 01/10/17   Mikey College, NP  Lidocaine HCl 3 % LOTN Apply 1 application topically every 6 (six) hours as needed. 01/10/17   Mikey College, NP  losartan (COZAAR) 50 MG tablet Take 1 tablet (50 mg total) by mouth daily. 03/24/17   Mikey College, NP  metFORMIN (GLUCOPHAGE) 500 MG tablet Take 1 tablet (500 mg total) by mouth daily with breakfast. 01/11/17   Mikey College, NP  Spacer/Aero-Hold Chamber Bags MISC 1 Device by Does not apply route every 6 (six) hours as needed (with inhaler). 01/10/17   Mikey College, NP  tamsulosin (FLOMAX) 0.4 MG CAPS capsule Take 1 capsule (0.4 mg total) by mouth daily. 03/13/17   Mikey College, NP  umeclidinium bromide (INCRUSE ELLIPTA) 62.5 MCG/INH AEPB Inhale 1 puff daily into the lungs. 02/02/17   Mikey College, NP  Allergies Patient has no known allergies.  Family History  Problem Relation Age of Onset  . COPD Neg Hx   . Diabetes Mellitus II Neg Hx   . Hypertension Neg Hx     Social History Social History   Tobacco Use  . Smoking status: Former Smoker    Last attempt to quit: 01/11/2007    Years since quitting: 10.4  . Smokeless tobacco:  Never Used  Substance Use Topics  . Alcohol use: No  . Drug use: No    Review of Systems  Constitutional: No fever/chills. Eyes: No visual changes. ENT: No sore throat. Cardiovascular: Denies chest pain. Respiratory: Denies shortness of breath. Gastrointestinal: No abdominal pain.  No nausea, no vomiting.  No diarrhea.  No constipation. Genitourinary: Negative for dysuria. Musculoskeletal: Negative for back pain. Skin: Negative for rash. Neurological: Positive for headache.  Negative for focal weakness or numbness.   ____________________________________________   PHYSICAL EXAM:  VITAL SIGNS: ED Triage Vitals  Enc Vitals Group     BP 06/02/17 2153 128/61     Pulse Rate 06/02/17 2153 99     Resp 06/02/17 2153 18     Temp 06/02/17 2153 98.8 F (37.1 C)     Temp Source 06/02/17 2153 Oral     SpO2 06/02/17 2153 97 %     Weight 06/02/17 2154 110 lb (49.9 kg)     Height 06/02/17 2154 5' (1.524 m)     Head Circumference --      Peak Flow --      Pain Score 06/02/17 2154 5     Pain Loc --      Pain Edu? --      Excl. in [place]? --     Constitutional: Alert and oriented. Well appearing and in no acute distress. Eyes: Conjunctivae are normal. PERRL. EOMI. Head: Atraumatic.  No sinus tenderness.  No tenderness at temples. Nose: No congestion/rhinnorhea. Mouth/Throat: Mucous membranes are moist.  Oropharynx non-erythematous. Neck: No stridor.  No cervical spine tenderness to palpation.  No carotid bruits.  Supple neck without meningismus. Cardiovascular: Normal rate, regular rhythm. Grossly normal heart sounds.  Good peripheral circulation. Respiratory: Normal respiratory effort.  No retractions. Lungs CTAB. Gastrointestinal: Soft and nontender. No distention. No abdominal bruits. No CVA tenderness. Musculoskeletal: No lower extremity tenderness nor edema.  No joint effusions. Neurologic: Alert and oriented x3.  CN II-XII grossly intact.  Normal speech and language.  5/5 motor  strength and sensation in all extremities.  No gross focal neurologic deficits are appreciated. No gait instability. Skin:  Skin is warm, dry and intact. No rash noted. Psychiatric: Mood and affect are normal. Speech and behavior are normal.  ____________________________________________   LABS (all labs ordered are listed, but only abnormal results are displayed)  Labs Reviewed  BASIC METABOLIC PANEL - Abnormal; Notable for the following components:      Result Value   Sodium 134 (*)    Chloride 100 (*)    Glucose, Bld 120 (*)    BUN 23 (*)    All other components within normal limits  CBC - Abnormal; Notable for the following components:   RBC 4.26 (*)    HCT 39.4 (*)    All other components within normal limits  URINALYSIS, COMPLETE (UACMP) WITH MICROSCOPIC - Abnormal; Notable for the following components:   Color, Urine YELLOW (*)    APPearance CLEAR (*)    All other components within normal limits  TROPONIN I  CBG MONITORING, ED  ____________________________________________  EKG  ED ECG REPORT I, Starlett Pehrson J, the attending physician, personally viewed and interpreted this ECG.   Date: 06/03/2017  EKG Time: 2145  Rate: 98  Rhythm: normal EKG, normal sinus rhythm  Axis: Normal  Intervals:none  ST&T Change: Nonspecific  ____________________________________________  RADIOLOGY  ED MD interpretation: No acute cardiopulmonary process, no ICH, left vertebral artery stenosis  Official radiology report(s): Ct Angio Head W Or Wo Contrast  Result Date: 06/03/2017 CLINICAL DATA:  Initial evaluation for acute dizziness with standing, headache. EXAM: CT ANGIOGRAPHY HEAD AND NECK TECHNIQUE: Multidetector CT imaging of the head and neck was performed using the standard protocol during bolus administration of intravenous contrast. Multiplanar CT image reconstructions and MIPs were obtained to evaluate the vascular anatomy. Carotid stenosis measurements (when applicable) are  obtained utilizing NASCET criteria, using the distal internal carotid diameter as the denominator. CONTRAST:  10mL ISOVUE-370 IOPAMIDOL (ISOVUE-370) INJECTION 76% COMPARISON:  None. FINDINGS: CT HEAD FINDINGS Brain: Mildly advanced cerebral atrophy with chronic small vessel ischemic disease. No acute intracranial hemorrhage. No acute large vessel territory infarct. No mass lesion, midline shift or mass effect. No hydrocephalus. No extra-axial fluid collection. Vascular: No hyperdense vessel. Scattered vascular calcifications noted within the carotid siphons. Skull: Scalp soft tissues and calvarium within normal limits. Sinuses: Paranasal sinuses mastoids are clear. Orbits: Globes normal soft tissues within normal limits. Review of the MIP images confirms the above findings CTA NECK FINDINGS Aortic arch: Visualized aortic arch of normal caliber with normal branch pattern. Extensive noncalcified mural plaque within the visualized arch. No high-grade stenosis about the origin of the great vessels. Focal penetrating plaque noted at the proximal left subclavian artery (series 10, image 267) secondary mild to moderate stenosis of the adjacent true lumen. Visualized subclavian arteries otherwise widely patent. Right carotid system: Fairly long segment eccentric noncalcified plaque throughout the right common carotid artery with mild diffuse narrowing. Concentric mixed plaque about the right bifurcation/proximal right ICA with associated narrowing of up to 40% by NASCET criteria. Right ICA widely patent distally to the skull base without stenosis, dissection, or occlusion. Left carotid system: Eccentric noncalcified plaque throughout the left common carotid artery with mild multifocal narrowing. Concentric mixed plaque about the left carotid bifurcation/proximal left ICA. Associated relatively mild narrowing of approximately 20-30% by NASCET criteria. 1 cm calcified granuloma present within the left upper lobe. There is  irregular linear consolidative opacity with calcifications at the left lung apex ICA patent distally to the skull base without stenosis, dissection or occlusion. Vertebral arteries: Both of the vertebral arteries arise from the subclavian arteries. Focal severe stenosis at the origin of the left vertebral artery (series 10, image 253). More moderate narrowing at the origin of the right vertebral artery (series 10, image 256). Vertebral arteries otherwise widely patent within the neck. Skeleton: No acute osseous abnormality. No worrisome lytic or blastic osseous lesions. Mild to moderate cervical spondylolysis, most notable at C5-6. Other neck: No acute soft tissue abnormality within the neck. Salivary glands normal. No adenopathy. Thyroid normal. Upper chest: Visualized upper mediastinum demonstrates no acute abnormality. Large 1 cm calcified granuloma at the right upper lobe. Irregular architectural distortion with volume loss and band like opacity at the right lung apex. Associated scattered calcifications. Finding suspected to reflect sequelae of prior granulomatous infection, similar to previous radiographs. Mild irregular apical thickening with calcification noted at the left lung apex as well. Visualized lungs are otherwise clear. Review of the MIP images confirms the above findings CTA HEAD FINDINGS  Anterior circulation: Internal carotid arteries are widely patent to the termini without flow-limiting stenosis. A1 segments, anterior communicating artery common anterior cerebral arteries widely patent. M1 segments widely patent bilaterally. Distal MCA branches well perfused and symmetric. Posterior circulation: Vertebral arteries widely patent to the vertebrobasilar junction. Posterior inferior cerebral arteries patent bilaterally. Basilar artery widely patent to its distal aspect without stenosis. Superior cerebral arteries patent bilaterally. Right PCA supplied via the basilar. Hypoplastic left P1 segment  with prominent left posterior communicating artery. PCAs patent to their distal aspects without flow-limiting stenosis. Venous sinuses: Patent. Anatomic variants: None significant. No aneurysm or vascular abnormality. Delayed phase: No pathologic enhancement. Review of the MIP images confirms the above findings IMPRESSION: 1. Negative CTA for large vessel occlusion. No acute vascular abnormality identified. 2. Atheromatous plaque about the carotid bifurcations bilaterally, with associated narrowing of up to 40% on the right and mild 20-30% on the left. 3. Atheromatous plaque at the origin of the vertebral arteries bilaterally with secondary moderate to severe stenoses, left worse than right. Otherwise widely patent vertebrobasilar system. 4. Architectural distortion with scarring at the right lung apex, most likely related to prior granulomatous infection. Electronically Signed   By: Jeannine Boga M.D.   On: 06/03/2017 03:28   Ct Angio Neck W And/or Wo Contrast  Result Date: 06/03/2017 CLINICAL DATA:  Initial evaluation for acute dizziness with standing, headache. EXAM: CT ANGIOGRAPHY HEAD AND NECK TECHNIQUE: Multidetector CT imaging of the head and neck was performed using the standard protocol during bolus administration of intravenous contrast. Multiplanar CT image reconstructions and MIPs were obtained to evaluate the vascular anatomy. Carotid stenosis measurements (when applicable) are obtained utilizing NASCET criteria, using the distal internal carotid diameter as the denominator. CONTRAST:  9mL ISOVUE-370 IOPAMIDOL (ISOVUE-370) INJECTION 76% COMPARISON:  None. FINDINGS: CT HEAD FINDINGS Brain: Mildly advanced cerebral atrophy with chronic small vessel ischemic disease. No acute intracranial hemorrhage. No acute large vessel territory infarct. No mass lesion, midline shift or mass effect. No hydrocephalus. No extra-axial fluid collection. Vascular: No hyperdense vessel. Scattered vascular  calcifications noted within the carotid siphons. Skull: Scalp soft tissues and calvarium within normal limits. Sinuses: Paranasal sinuses mastoids are clear. Orbits: Globes normal soft tissues within normal limits. Review of the MIP images confirms the above findings CTA NECK FINDINGS Aortic arch: Visualized aortic arch of normal caliber with normal branch pattern. Extensive noncalcified mural plaque within the visualized arch. No high-grade stenosis about the origin of the great vessels. Focal penetrating plaque noted at the proximal left subclavian artery (series 10, image 267) secondary mild to moderate stenosis of the adjacent true lumen. Visualized subclavian arteries otherwise widely patent. Right carotid system: Fairly long segment eccentric noncalcified plaque throughout the right common carotid artery with mild diffuse narrowing. Concentric mixed plaque about the right bifurcation/proximal right ICA with associated narrowing of up to 40% by NASCET criteria. Right ICA widely patent distally to the skull base without stenosis, dissection, or occlusion. Left carotid system: Eccentric noncalcified plaque throughout the left common carotid artery with mild multifocal narrowing. Concentric mixed plaque about the left carotid bifurcation/proximal left ICA. Associated relatively mild narrowing of approximately 20-30% by NASCET criteria. 1 cm calcified granuloma present within the left upper lobe. There is irregular linear consolidative opacity with calcifications at the left lung apex ICA patent distally to the skull base without stenosis, dissection or occlusion. Vertebral arteries: Both of the vertebral arteries arise from the subclavian arteries. Focal severe stenosis at the origin of the left vertebral  artery (series 10, image 253). More moderate narrowing at the origin of the right vertebral artery (series 10, image 256). Vertebral arteries otherwise widely patent within the neck. Skeleton: No acute osseous  abnormality. No worrisome lytic or blastic osseous lesions. Mild to moderate cervical spondylolysis, most notable at C5-6. Other neck: No acute soft tissue abnormality within the neck. Salivary glands normal. No adenopathy. Thyroid normal. Upper chest: Visualized upper mediastinum demonstrates no acute abnormality. Large 1 cm calcified granuloma at the right upper lobe. Irregular architectural distortion with volume loss and band like opacity at the right lung apex. Associated scattered calcifications. Finding suspected to reflect sequelae of prior granulomatous infection, similar to previous radiographs. Mild irregular apical thickening with calcification noted at the left lung apex as well. Visualized lungs are otherwise clear. Review of the MIP images confirms the above findings CTA HEAD FINDINGS Anterior circulation: Internal carotid arteries are widely patent to the termini without flow-limiting stenosis. A1 segments, anterior communicating artery common anterior cerebral arteries widely patent. M1 segments widely patent bilaterally. Distal MCA branches well perfused and symmetric. Posterior circulation: Vertebral arteries widely patent to the vertebrobasilar junction. Posterior inferior cerebral arteries patent bilaterally. Basilar artery widely patent to its distal aspect without stenosis. Superior cerebral arteries patent bilaterally. Right PCA supplied via the basilar. Hypoplastic left P1 segment with prominent left posterior communicating artery. PCAs patent to their distal aspects without flow-limiting stenosis. Venous sinuses: Patent. Anatomic variants: None significant. No aneurysm or vascular abnormality. Delayed phase: No pathologic enhancement. Review of the MIP images confirms the above findings IMPRESSION: 1. Negative CTA for large vessel occlusion. No acute vascular abnormality identified. 2. Atheromatous plaque about the carotid bifurcations bilaterally, with associated narrowing of up to 40% on  the right and mild 20-30% on the left. 3. Atheromatous plaque at the origin of the vertebral arteries bilaterally with secondary moderate to severe stenoses, left worse than right. Otherwise widely patent vertebrobasilar system. 4. Architectural distortion with scarring at the right lung apex, most likely related to prior granulomatous infection. Electronically Signed   By: Jeannine Boga M.D.   On: 06/03/2017 03:28   Dg Chest Port 1 View  Result Date: 06/03/2017 CLINICAL DATA:  Acute onset of dizziness and headache. EXAM: PORTABLE CHEST 1 VIEW COMPARISON:  Chest radiograph performed 01/21/2017, and CT of the chest performed 02/01/2014 FINDINGS: The lungs are well-aerated. Scarring is again noted at the right lung apex, with chronic volume loss. There is no evidence of pleural effusion or pneumothorax. The cardiomediastinal silhouette is within normal limits. No acute osseous abnormalities are seen. There is stable chronic prominence of the right paratracheal soft tissues, reflecting normal vasculature. IMPRESSION: Chronic changes at the right lung apex. No acute cardiopulmonary process seen. Electronically Signed   By: Garald Balding M.D.   On: 06/03/2017 02:17    ____________________________________________   PROCEDURES  Procedure(s) performed: None  Procedures  Critical Care performed: No  ____________________________________________   INITIAL IMPRESSION / ASSESSMENT AND PLAN / ED COURSE  As part of my medical decision making, I reviewed the following data within the [place] notes reviewed and incorporated, Interpreter needed, Labs reviewed, EKG interpreted, Old chart reviewed, Radiograph reviewed  and Notes from prior ED visits   72 year old male with hypertension, diabetes, COPD who presents with constant headache and vertigo-like dizziness for 3 days. Differential diagnosis includes, but is not limited to, intracranial hemorrhage,  meningitis/encephalitis, previous head trauma, cavernous venous thrombosis, tension headache, temporal arteritis, migraine or migraine equivalent, idiopathic  intracranial hypertension, and non-specific headache.  Laboratory and urinalysis results are unremarkable.  Chest x-ray demonstrates no acute process.  Neck is supple and patient has no focal neurological deficits on exam.  Given patient's age, medical comorbidities, radiation of headache into his neck, will obtain CTA head and neck.  Will obtain orthostatic vital signs, initiate IV fluids, meclizine for dizziness.  Clinical Course as of Jun 04 435  Sat Jun 03, 2017  0435 Headache and dizziness improved.  Updated patient and his daughter on CT results.  Patient has no strokelike symptoms, no focal neurological deficits on reexamination.  Neck remains supple.  Will discharge home with vascular surgery follow-up.  Strict return precautions given.  Both verbalized understanding and agree with plan of care.  [JS]    Clinical Course User Index [JS] Paulette Blanch, MD     ____________________________________________   FINAL CLINICAL IMPRESSION(S) / ED DIAGNOSES  Final diagnoses:  Dizziness  Vertigo  Stenosis of left vertebral artery     ED Discharge Orders    None       Note:  This document was prepared using Dragon voice recognition software and may include unintentional dictation errors.    Paulette Blanch, MD 06/03/17 [id number]

## 2017-06-03 NOTE — ED Notes (Signed)
Patient transported to CT 

## 2017-06-03 NOTE — ED Notes (Signed)
Patient discharge and follow up information reviewed with patient by ED nursing staff and patient given the opportunity to ask questions pertaining to ED visit and discharge plan of care. Patient advised that should symptoms not continue to improve, resolve entirely, or should new symptoms develop then a follow up visit with their PCP or a return visit to the ED may be warranted. Patient verbalized consent and understanding of discharge plan of care including potential need for further evaluation. Patient being discharged in stable condition per attending ED physician on duty.   Discharge instructions/follow up/prescriptions discussed with pt using [place] interpreter

## 2017-06-07 ENCOUNTER — Telehealth: Payer: Self-pay | Admitting: Nurse Practitioner

## 2017-06-07 NOTE — Telephone Encounter (Signed)
Patient's son is concerned about patient being exhausted and with his complex Hx want to make sure if he needs to take him to the hospital despite he was just back from the hospital, triage the patient and Vital are done wt-109 lbs, B/P 144/65, temp 98.23F, pulse 106 and pulse ox is 99 %. Advised patient as per Ander Purpura that if he is not symptomatic he can keep his follow up appointment but if he becomes Symptomatic then take him to ER. Son understand very well and will See Lauren on Monday.

## 2017-06-09 ENCOUNTER — Other Ambulatory Visit: Payer: Self-pay | Admitting: Nurse Practitioner

## 2017-06-09 DIAGNOSIS — I10 Essential (primary) hypertension: Secondary | ICD-10-CM

## 2017-06-10 ENCOUNTER — Other Ambulatory Visit: Payer: Self-pay | Admitting: Nurse Practitioner

## 2017-06-10 DIAGNOSIS — Z13220 Encounter for screening for lipoid disorders: Secondary | ICD-10-CM

## 2017-06-10 DIAGNOSIS — E119 Type 2 diabetes mellitus without complications: Secondary | ICD-10-CM

## 2017-06-12 ENCOUNTER — Other Ambulatory Visit: Payer: Self-pay

## 2017-06-12 ENCOUNTER — Encounter: Payer: Self-pay | Admitting: Nurse Practitioner

## 2017-06-12 ENCOUNTER — Ambulatory Visit (INDEPENDENT_AMBULATORY_CARE_PROVIDER_SITE_OTHER): Payer: Medicare Other | Admitting: Nurse Practitioner

## 2017-06-12 VITALS — BP 121/46 | HR 93 | Temp 98.5°F | Resp 18 | Ht 60.0 in | Wt 112.0 lb

## 2017-06-12 DIAGNOSIS — J431 Panlobular emphysema: Secondary | ICD-10-CM

## 2017-06-12 DIAGNOSIS — G44209 Tension-type headache, unspecified, not intractable: Secondary | ICD-10-CM | POA: Diagnosis not present

## 2017-06-12 DIAGNOSIS — S46011A Strain of muscle(s) and tendon(s) of the rotator cuff of right shoulder, initial encounter: Secondary | ICD-10-CM

## 2017-06-12 DIAGNOSIS — R42 Dizziness and giddiness: Secondary | ICD-10-CM | POA: Diagnosis not present

## 2017-06-12 MED ORDER — MECLIZINE HCL 12.5 MG PO TABS: 12.5000 mg | ORAL_TABLET | Freq: Three times a day (TID) | ORAL | 0 refills | Status: DC | PRN

## 2017-06-12 MED ORDER — BUDESONIDE-FORMOTEROL FUMARATE 160-4.5 MCG/ACT IN AERO: 2.0000 | INHALATION_SPRAY | Freq: Two times a day (BID) | RESPIRATORY_TRACT | 3 refills | Status: DC

## 2017-06-12 NOTE — Patient Instructions (Addendum)
Jonathan Willis, Thank you for coming in to clinic today.  1. You may have a rotator cuff strain or tear.   - Start taking Tylenol extra strength 1 to 2 tablets every 6-8 hours for aches or fever/chills for next few days as needed.  Do not take more than 3,000 mg in 24 hours from all medicines.  May take Ibuprofen as well if tolerated 200-478m every 8 hours as needed. May alternate tylenol and ibuprofen in same day. - Use heat and ice.  Apply this for 15 minutes at a time 6-8 times per day.   - Muscle rub with lidocaine gel (Aspercreme, IcyHot, or Bengay with lidocaine), lidocaine patch, Biofreeze, or tiger balm for topical pain relief.  Avoid using this with heat and ice to avoid burns. - Use a tennis ball or racquet ball to massage your neck and shoulder muscles.  Can put two into a sock and tie the ends.  If shoulder pain is not improved in about 4-6 weeks, let me know and we can send him to orthopedics.  2. You also likely have vertigo.  Read below. - Continue meclizine three times daily as needed. Use a smaller dose to try to prevent the sensation of blacking.  3. Change symbicort (red inhaler) to higher dose.  Continue 2 puffs twice daily.  Please schedule a follow-up appointment with LCassell Smiles AGNP. Return in about 4 weeks (around 07/10/2017) for diabetes, blood pressure, prostate.  If you have any other questions or concerns, please feel free to call the clinic or send a message through [facility] You may also schedule an earlier appointment if necessary.  You will receive a survey after today's visit either digitally by e-mail or paper by [employer] Your experiences and feedback matter to [place]  Please respond so we know how we are doing as we provide care for you.   LCassell Smiles DNP, AGNP-BC Adult Gerontology Nurse Practitioner [facility]   Chng m?t Vertigo Chng m?t l m?t c?m gic qu v? ho?c nh?ng v?t xung quanh qu v? chuy?n ??ng trong khi th?c t? khng  ph?i nh? v?y. Chng m?t c th? nguy hi?m n?u n x?y ra khi qu v? ?ang lm g ? c th? gy nguy hi?m cho b?n thn ho?c cho ng??i khc, ch?ng h?n nh? li xe. Nguyn nhn g gy ra? Tnh tr?ng ny x?y ra do r?i lo?n cc tn hi?u ???c g?i t? cc h? th?ng c?m gic c?a c? th? ??n no c?a qu v?. Cc nguyn nhn khc nhau c?a s? r?i lo?n d?n ??n chng m?t bao g?m:  Nhi?m trng, ??c bi?t l ? tai trong.  M?t ph?n ?ng c h?i v?i thu?c ho?c l?m d?ng r??u v thu?c.  Cai ma ty ho?c r??u.  Thay ??i t? th? nhanh, nh? khi ?ang n?m xu?ng ho?c l?n mnh trn gi??ng.  ?au n?a ??u.  Gi?m dng mu t?i no.  Huy?t p gi?m.  T?ng p su?t trong no do m?t ch?n th??ng ? ??u ho?c c?, ??t qu?, nhi?m trng, kh?i u ho?c ch?y mu.  Cc b?nh l ? h? th?n kinh trung ??ng.  Cc d?u hi?u ho?c tri?u ch?ng l g? Cc tri?u ch?ng c?a tnh tr?ng ny th??ng x?y ra khi qu v? c? ??ng ??u ho?c m?t v? cc h??ng khc nhau. Cc tri?u ch?ng c th? b?t ??u ??t ng?t v chng th??ng ko di ch?a ??n m?t pht. Tri?u ch?ng c th? bao g?m:  M?t th?ng b?ng v  ng.  C c?m gic nh? qu v? ?ang quay ho?c chuy?n ??ng.  C c?m gic nh? nh?ng v?t xung quanh ?ang quay ho?c chuy?n ??ng.  Bu?n nn v nn.  Nhn m? ho?c nhn m?t thnh hai.  Kh nghe.  Ni l?p b?p.  Chng m?t.  C? ??ng m?t ngoi  mu?n (rung gi?t c?u m?t).  Cc tri?u ch?ng c th? nh? v ch? gy m?t cht kh ch?u, ho?c c th? n?ng v ?nh h??ng ??n cu?c s?ng hng ngy. Cc c?n chng m?t c th? quay tr? l?i (ti pht) theo th?i gian v th??ng kh?i pht do m?t s? c? ??ng nh?t ??nh. Cc tri?u ch?ng c th? c?i thi?n theo th?i gian. Ch?n ?on tnh tr?ng ny nh? th? no? Tnh tr?ng ny c th? ???c ch?n ?on d?a vo khai thc b?nh s? v tnh tr?ng rung gi?t c?u m?t. Chuyn gia ch?m [place] s?c kh?e c th? ki?m tra c? ??ng c?a m?t b?ng cch ?? ngh? qu v? thay ??i t? th? nhanh chng ?? lm kh?i pht rung gi?t c?u m?t. Ki?m tra ny ???c g?i l nghi?m php [place], nghi?m  php l?c l? ??u ho?c nghi?m php l?c ngang. Qu v? c th? ???c gi?i thi?u ??n m?t chuyn gia ch?m [place] s?c kh?e chuyn v? cc v?n ?? tai, m?i, h?ng (ENT) bc s? tai m?i h?ng) ho?c m?t chuyn gia v? cc r?i lo?n c?a h? th?n kinh trung ??ng (bc s? chuyn khoa th?n kinh). Qu v? c th? ???c lm cc ki?m tra khc, bao g?m:  Khm th?c th?.  Xt nghi?m mu.  Ch?p MRI.  Ch?p CT.  ?i?n tm ?? (ECG). Ki?m tra ny ghi l?i ho?t ??ng ?i?n c?a tim qu v?.  ?i?n no ?? (EEG). Ki?m tra ny ghi l?i ho?t ??ng ?i?n c?a no qu v?.  Ki?m tra thnh l?c.  Tnh tr?ng ny ???c ?i?u tr? nh? th? no? Vi?c ?i?u tr? b?nh ny ty thu?c vo nguyn nhn v m?c ?? n?ng c?a cc tri?u ch?ng. Cc ty ch?n ?i?u tr? bao g?m:  Thu?c ?i?u tr? bu?n nn ho?c chng m?t. Nh?ng thu?c ny th??ng ???c s? d?ng trong nh?ng tr??ng h?p n?ng. M?t s? lo?i thu?c ???c s? d?ng ?? ?i?u tr? cc b?nh khc c?ng c th? gi?m ho?c lm h?t cc tri?u ch?ng chng m?t. Nh?ng th?c ph?m ny bao g?m: ? Thu?c ki?m sot d? ?ng (thu?c khng histamine). ? Thu?c ki?m sot cc c?n co gi?t (thu?c ch?ng co gi?t). ? Thu?c lm gi?m tr?m c?m (thu?c ch?ng tr?m c?m). ? Thu?c lm gi?m lo l?ng (thu?c an th?n).  C? ??ng ??u ?? ?i?u ch?nh tai trong c?a qu v? tr? l?i bnh th??ng. N?u chng m?t do m?t v?n ?? v? tai gy ra, chuyn gia ch?m  s?c kh?e c?a qu v? c th? khuy?n ngh? m?t s? c? ??ng ?? kh?c ph?c v?n ??.  Ph?u thu?t. V?n ?? ny hi?m khi x?y ra.  Tun th? nh?ng h??ng d?n ny ? nh: [place]  Di chuy?n th?t ch?m. Trnh nh?ng c? ??ng thn ng??i ho?c c? ??ng ??u ??t ng?t.  Trnh li xe.  Trnh v?n hnh my mc h?ng n?ng.  Trnh th?c hi?n cc nhi?m v? c th? gy nguy hi?m cho b?n thn ho?c ng??i khc n?u qu v? b? chng m?t trong khi th?c hi?n nhi?m v? ?.  N?u qu v? kh ?i l?i ho?c kh gi? th?ng b?ng, th? dng m?t cy g?y ?? cn b?ng. N?u qu v? c?m  th?y chng m?t ho?c khng cn b?ng, hy ng?i xu?ng ngay.  Tr? l?i sinh ho?t bnh th??ng theo ch? d?n  c?a chuyn gia ch?m [place] s?c kh?e. H?i chuyn gia ch?m [place] s?c kh?e v? cc ho?t ??ng no an ton cho qu v?. H??ng d?n chung  Ch? s? d?ng thu?c khng k ??n v thu?c k ??n theo ch? d?n c?a chuyn gia ch?m [place] s?c kh?e.  Hessie Diener m?t s? t? th? ho?c c? ??ng nh?t ??nh nh? h??ng d?n c?a chuyn gia ch?m [place] s?c kh?e.  U?ng ?? n??c ?? gi? cho n??c ti?u trong ho?c c mu vng nh?t.  Tun th? t?t c? cc l?n khm theo di theo ch? d?n c?a chuyn gia ch?m [place] s?c kh?e. ?i?u ny c vai tr quan tr?ng. Hy lin l?c v?i chuyn gia ch?m [place] s?c kh?e n?u:  Thu?c khng lm gi?m tnh tr?ng chng m?c ho?c lm tnh tr?ng tr?m tr?ng h?n.  Qu v? b? s?t.  Tnh tr?ng c?a qu v? tr?m tr?ng h?n ho?c qu v? c nh?ng tri?u ch?ng m?i.  Gia ?nh v b?n b qu v? nh?n th?y qu v? c nh?ng thay ??i v? hnh vi.  Tnh tr?ng bu?n nn v nn m?a tr?m tr?ng h?n.  Qu v? b? t b ho?c c?m gic "r?n r?n nh? ki?n b" ? m?t vng c? th?. Yu c?u tr? gip ngay l?p t?c n?u:  Qu v? kh c? ??ng ho?c kh ni.  Qu v? lun b? chng m?t.  Qu v? b? ng?t.  Qu v? b? ?au ??u r?t nhi?u.  Qu v? b? y?u ? bn tay, cnh tay ho?c chn.  Qu v? b? thay ??i thnh l?c ho?c th? l?c.  Qu v? b? c?ng c?.  Qu v? b? nh?y c?m v?i nh sng. Thng tin ny khng nh?m m?c ?ch thay th? cho l?i khuyn m chuyn gia ch?m [place] s?c kh?e ni v?i qu v?. Hy b?o ??m qu v? ph?i th?o lu?n b?t k? v?n ?? g m qu v? c v?i chuyn gia ch?m [place] s?c kh?e c?a qu v?. Document Released: 03/14/2005 Document Revised: 05/29/2016 Document Reviewed: 07/07/2014 Elsevier Interactive Patient Education  2018 [place] th??ng chp xoay kh?p vai (Rotator Cuff Injury) Ch?n th??ng chp xoay kh?p vai l b?t c? lo?i ch?n th??ng no ? b? c? v gn t?o nn b? ph?n gi? ?n ??nh cho kh?p vai c?a qu v?. B? ph?n ny gi? ph?n ch?m x??ng cnh tay trn (x??ng cnh tay) trong h?c c?a x??ng vai (x??ng b? vai). NGUYN NHN. Ch?n th??ng chp xoay ph? bi?n nh?t  l do ch?i cc mn th? thao ho?c cc ho?t ??ng lm tay qu v? lin t?c c? ??ng qua ??u. V d? v? nh?ng ho?t ??ng ny l nm, nng t?, b?i, ho?c cc mn th? thao dng v?t. Tnh tr?ng kch ?ng ko di (m?n tnh) ? chp xoay kh?p vai c th? gy ?au nh?c ho?c s?ng n? (vim), vim ti thanh m?c, v cu?i cng l lm t?n th??ng gn, ch?ng h?n nh? rch (??t). D?U HI?U V TRI?U CH?NG Rch chp xoay kh?p vai c?p tnh.  C?m gic rch ??t ng?t sau ? l ?au nhi r?t nhi?u t? trn vai xu?ng cnh tay v v? pha khu?u tay.  Gi?m ph?m vi c? ??ng c?a vai v ?au v co th?t c?.  ?au n?ng.  Khng th? nng tay kh?i s??n v ?au v m?t l?c co c? (rch n?ng). Rch chp xoay kh?p  vai m?n tnh:  C?n ?au th??ng n?ng h?n vo ban ?m v ?nh h??ng ??n gi?c ng?.  Tnh tr?ng y?u d?n v gi?m kh? n?ng c? ??ng c?a kh?p vai khi c?n ?au n?ng thm.  Gi?m ph?m vi c? ??ng. Vim gn chp xoay kh?p vai:  ?au nh?c su trong vai v pha ngoi cnh tay trn vai qu v?.  C?n ?au ti?p t?c t?ng d?n v n?ng h?n khi nng cnh tay v? m?t bn v g?p vo trong. CH?N ?ON Ch?n th??ng chp xoay kh?p vai ???c ch?n ?on thng qua vi?c khai thc b?nh s?, khm th?c th?, v ki?m tra hnh ?nh. B?nh s? gip xc ??nh lo?i ch?n th??ng chp xoay kh?p vai. Chuyn gia ch?m [place] s?c kh?e c?a qu v? s? ki?m tra bn vai b? ch?n th??ng, khm ch? b? ch?n th??ng, v yu c?u qu v? c? ??ng vai v? cc t? th? khc nhau. Ch?p X quang th??ng ???c th?c hi?n ?? lo?i tr? nh?ng nguyn nhn khc gy ?au vai, ch?ng h?n nh? gy x??ng. MRI l m?t th?m khm l?a ch?n cho nh?ng ch?n th??ng vai nghim tr?ng nh?t v cc hnh ?nh s? cho th?y cc c? v gn. ?I?U TR? Rch m?n tnh:  Thu?c gi?m ?au, ch?ng h?n nh? acetaminophen ho?c ibuprofen.  V?t l tr? li?u v t?p theo ph?m vi c? ??ng c th? h?u ch trong vi?c duy tr ch?c n?ng v ?? b?n c?a kh?p vai.  Tim steroid vo kh?p vai c?a qu v?.  Ch?nh s?a chp xoay kh?p vai b?ng ph?u thu?t n?u ch?n th??ng khng h?i ph?c b?ng ?i?u  tr? khng xm l?n. Rch c?p tnh:  Cc lo?i thu?c ch?ng vim nh? ibuprofen v naproxen gip lm gi?m ?au v s?ng n?.  B?ng ?eo ?? ?? cho cnh tay v gip c? chp xoay kh?p vai th? l?ng. Khng nn s? d?ng b?ng ?eo trong th?i gian di. N c th? lm c?ng kh?p vai ?ng k?.  C th? xem xt ph?u thu?t trong ph?m vi vi tu?n, ??c bi?t l ? nh?ng ng??i tr?, n?ng ??ng, ?? h?i ph?c hon ton ch?c n?ng c?a kh?p vai.  Ch? ??nh ?i?u tr? b?ng ph?u thu?t trong nh?ng tr??ng h?p sau: ? Tu?i d??i 60. ? Chp xoay kh?p vai rch hon ton. ? ? s? d?ng v?t l tr? li?u, ngh? ng?i, v cc lo?i thu?c ch?ng vim trong 6-8 tu?n, m khng c c?i thi?n. ? Cng vi?c v ho?t ??ng th? thao ?i h?i s? d?ng kh?p vai lin t?c. Vim gn:  Cc lo?i thu?c ch?ng vim nh? ibuprofen v naproxen gip lm gi?m ?au v s?ng n?.  B?ng ?eo ?? ?? cho cnh tay v gip c? chp xoay kh?p vai th? l?ng. Khng nn s? d?ng b?ng ?eo trong th?i gian di. N c th? lm c?ng kh?p vai ?ng k?.  Vim gn n?ng c th? c?n: ? Tim steroid vo kh?p vai c?a qu v?. ? V?t l tr? li?u. ? Ph?u thu?t. H??NG D?N CH?M [place] T?I NH  Ch??m ? l?nh vo ch? ch?n th??ng: ? Cho ? l?nh vo ti nh?a. ? ?? kh?n t?m [place] v ti. ? ?? ? l?nh trong kho?ng 20 pht, 2 - 3 l?n m?i ngy.  N?u qu v? dng d?ng c? c? ??nh vai (b?ng v ?ai ?eo), hy ?eo cho ??n khi chuyn gia ch?m [place] s?c kh?e b?o d?ng l?i.  Qu v? c th? c?n k vi chi?c g?i ho?c v?t dng ?? ??m  vo ban ?m ?? lm gi?m ?au v s?ng n?.  Ch? s? d?ng thu?c khng c?n k ??n ho?c thu?c c?n k ??n ?? gi?m ?au, gi?m c?m gic kh ch?u ho?c h? s?t theo ch? d?n c?a chuyn gia ch?m [place] s?c kh?e c?a qu v?.  T?p cc bi t?p n?m ??n gi?n c?a bn tay b?ng m?t qu? bng cao su m?m ?? lm gi?m s?ng n? ? bn tay. ?I [facility] N?U:  ?au vai t?ng ln ho?c c?n ?au ho?c t b m?i c ? cnh tay, bn tay ho?c ngn tay c?a qu v?.  Bn tay ho?c ngn tay c?a qu v? l?nh h?n tay bn kia. NGAY L?P T?C ?I KHM  N?U:  Cnh tay, bn tay ho?c ngn tay c?a qu v? b? t ho?c ?au bu?t.  Cnh tay, bn tay ho?c ngn tay c?a qu v? s?ng ln v ?au ??n, ho?c chng chuy?n sang mu tr?ng ho?c xanh. ??M B?O QU V?:  Hi?u r cc h??ng d?n ny.  S? theo di tnh tr?ng c?a mnh.  S? yu c?u tr? gip ngay l?p t?c n?u qu v? c?m th?y khng kh?e ho?c th?y tr?m tr?ng h?n. Thng tin ny khng nh?m m?c ?ch thay th? cho l?i khuyn m chuyn gia ch?m [place] s?c kh?e ni v?i qu v?. Hy b?o ??m qu v? ph?i th?o lu?n b?t k? v?n ?? g m qu v? c v?i chuyn gia ch?m  s?c kh?e c?a qu v?. Document Released: 03/14/2005 Document Revised: 03/19/2013 Document Reviewed: 10/24/2012 Elsevier Interactive Patient Education  2018 [place].    Shoulder Range of Motion Exercises Shoulder range of motion (ROM) exercises are designed to keep the shoulder moving freely. They are often recommended for people who have shoulder pain. Phase 1 exercises When you are able, do this exercise 5-6 days per week, or as told by your health care provider. Work toward doing 2 sets of 10 swings. Pendulum Exercise How To Do This Exercise Lying Down 1. Lie face-down on a bed with your abdomen close to the side of the bed. 2. Let your arm hang over the side of the bed. 3. Relax your shoulder, arm, and hand. 4. Slowly and gently swing your arm forward and back. Do not use your neck muscles to swing your arm. They should be relaxed. If you are struggling to swing your arm, have someone gently swing it for you. When you do this exercise for the first time, swing your arm at a 15 degree angle for 15 seconds, or swing your arm 10 times. As pain lessens over time, increase the angle of the swing to 30-45 degrees. 5. Repeat steps 1-4 with the other arm.  How To Do This Exercise While Standing 1. Stand next to a sturdy chair or table and hold on to it with your hand. 1. Bend forward at the waist. 2. Bend your knees slightly. 3. Relax your other arm  and let it hang limp. 4. Relax the shoulder blade of the arm that is hanging and let it drop. 5. While keeping your shoulder relaxed, use body motion to swing your arm in small circles. The first time you do this exercise, swing your arm for about 30 seconds or 10 times. When you do it next time, swing your arm for a little longer. 6. Stand up tall and relax. 7. Repeat steps 1-7, this time changing the direction of the circles. 2. Repeat steps 1-8 with the other arm.  Phase 2 exercises Do  these exercises 3-4 times per day on 5-6 days per week or as told by your health care provider. Work toward holding the stretch for 20 seconds. Stretching Exercise 1 1. Lift your arm straight out in front of you. 2. Bend your arm 90 degrees at the elbow (right angle) so your forearm goes across your body and looks like the letter "L." 3. Use your other arm to gently pull the elbow forward and across your body. 4. Repeat steps 1-3 with the other arm. Stretching Exercise 2 You will need a towel or rope for this exercise. 1. Bend one arm behind your back with the palm facing outward. 2. Hold a towel with your other hand. 3. Reach the arm that holds the towel above your head, and bend that arm at the elbow. Your wrist should be behind your neck. 4. Use your free hand to grab the free end of the towel. 5. With the higher hand, gently pull the towel up behind you. 6. With the lower hand, pull the towel down behind you. 7. Repeat steps 1-6 with the other arm.  Phase 3 exercises Do each of these exercises at four different times of day (sessions) every day or as told by your health care provider. To begin with, repeat each exercise 5 times (repetitions). Work toward doing 3 sets of 12 repetitions or as told by your health care provider. Strengthening Exercise 1 You will need a light weight for this activity. As you grow stronger, you may use a heavier weight. 1. Standing with a weight in your hand, lift your arm  straight out to the side until it is at the same height as your shoulder. 2. Bend your arm at 90 degrees so that your fingers are pointing to the ceiling. 3. Slowly raise your hand until your arm is straight up in the air. 4. Repeat steps 1-3 with the other arm.  Strengthening Exercise 2 You will need a light weight for this activity. As you grow stronger, you may use a heavier weight. 1. Standing with a weight in your hand, gradually move your straight arm in an arc, starting at your side, then out in front of you, then straight up over your head. 2. Gradually move your other arm in an arc, starting at your side, then out in front of you, then straight up over your head. 3. Repeat steps 1-2 with the other arm.  Strengthening Exercise 3 You will need an elastic band for this activity. As you grow stronger, gradually increase the size of the bands or increase the number of bands that you use at one time. 1. While standing, hold an elastic band in one hand and raise that arm up in the air. 2. With your other hand, pull down the band until that hand is by your side. 3. Repeat steps 1-2 with the other arm.  This information is not intended to replace advice given to you by your health care provider. Make sure you discuss any questions you have with your health care provider. Document Released: 12/11/2002 Document Revised: 11/08/2015 Document Reviewed: 03/10/2014 Elsevier Interactive Patient Education  [employer].

## 2017-06-12 NOTE — Progress Notes (Signed)
Subjective:    Patient ID: Jonathan Willis, male    DOB: 1945/06/23, 72 y.o.   MRN: [id number]  Jonathan Willis is a 72 y.o. male presenting on 06/12/2017 for Dizziness (presistent throbbing head pain w/ vertigo symptoms  x 15 days ago, pt was seen in the ER x 10 day) and Neck Pain (Rt side head pain that radiate down the neck, shoulder and arm.)  Phone interpreter declined today by patient and daughter-in-law: Yolonda Kida  HPI Headache Pt has had headache R side of head into neck and into right shoulder.  Continues to hurt and ache daily.  Symptoms have not changed since ER visit about 10 days ago. - Has not taken any medications to help relieve headache.  Has Tylenol and Advil at home, but has not taken it.  He is concerned about safety of his lungs with additional medications.  Dizziness Pt reports continued dizziness.  He is also having difficulty catching his breath.  Is having trouble walking because of being lightheaded, so he has spend much of the last 5-6 days resting/lying on couch.   - Dizziness is not spinning, but pt reports feeling like he has blackout sensation.  Only occurs for 3-5 seconds then resolves, but is repeated throughout the day. - He has spinning sensation when not taking meclizine.  He is taking meclizine tid and having partial improvement but new lightheadedness and near-syncope.  COPD - using rescure inhaler 3-4 x per day r/t shortness of breath.  Is still taking Incruse once daily and Symbicort 2 puffs twice daily (80-4.5mg /act).    Neck and R shoulder pain CT scan in ER -  R Carotid occlusion (moderate 40%).  Pt has Vascular referral in place.  No appointment until April.  Has started taking 81 mg aspirin. - Pt reports no injury of R shoulder, but notes it is painful to move shoulder in full ROM.  This has also not improved over the last 10 days since leaving hospital.   Social History   Tobacco Use  . Smoking status: Former Smoker    Last attempt to quit: 01/11/2007      Years since quitting: 10.4  . Smokeless tobacco: Never Used  Substance Use Topics  . Alcohol use: No  . Drug use: No    Review of Systems Per HPI unless specifically indicated above     Objective:    BP (!) 121/46 (BP Location: Left Arm, Patient Position: Sitting, Cuff Size: Normal)   Pulse 93   Temp 98.5 F (36.9 C) (Oral)   Resp 18   Ht 5' (1.524 m)   Wt 112 lb (50.8 kg)   SpO2 98%   BMI 21.87 kg/m   Wt Readings from Last 3 Encounters:  06/12/17 112 lb (50.8 kg)  06/02/17 110 lb (49.9 kg)  04/05/17 111 lb (50.3 kg)    Physical Exam  Constitutional: He is oriented to person, place, and time. He appears well-developed and well-nourished. No distress.  HENT:  Head: Normocephalic and atraumatic.  Neck: Neck supple. Muscular tenderness (R trapezius) present. No spinous process tenderness present. Carotid bruit is not present. No neck rigidity. Decreased range of motion (decreased lateral rotation to L and R) present. No edema and no erythema present.  Cardiovascular: Normal rate, regular rhythm, S1 normal, S2 normal, normal heart sounds and intact distal pulses.  Pulmonary/Chest: Effort normal and breath sounds normal. No respiratory distress. He has no wheezes. He has no rales.  Musculoskeletal: He exhibits no edema (  pedal).  Right Shoulder Inspection: Normal appearance bilateral symmetrical Palpation: Tender to palpation over anterior, lateral shoulder.  Non-tender to posterior shoulder  ROM: Full intact active ROM forward flexion, abduction, internal / external rotation, symmetrical but has pain at full limit of motion. Special Testing: Rotator cuff testing positive for pain but negative for weakness with supraspinatus empty can test. Normal with full can test.  O'brien's negative for labral pain, Hawkin's AC impingement negative for pain Strength: Strength 4/5 flex/ext, ext rot / int rot, grip, rotator cuff str testing. Neurovascular: Distally intact pulses, sensation  to light touch   Neurological: He is alert and oriented to person, place, and time.  Skin: Skin is warm and dry.  Psychiatric: He has a normal mood and affect. His behavior is normal.  Vitals reviewed.  Results for orders placed or performed during the hospital encounter of 66/44/03  Basic metabolic panel  Result Value Ref Range   Sodium 134 (L) 135 - 145 mmol/L   Potassium 3.9 3.5 - 5.1 mmol/L   Chloride 100 (L) 101 - 111 mmol/L   CO2 24 22 - 32 mmol/L   Glucose, Bld 120 (H) 65 - 99 mg/dL   BUN 23 (H) 6 - 20 mg/dL   Creatinine, Ser 0.96 0.61 - 1.24 mg/dL   Calcium 9.3 8.9 - 10.3 mg/dL   GFR calc non Af Amer >60 >60 mL/min   GFR calc Af Amer >60 >60 mL/min   Anion gap 10 5 - 15  CBC  Result Value Ref Range   WBC 7.0 3.8 - 10.6 K/uL   RBC 4.26 (L) 4.40 - 5.90 MIL/uL   Hemoglobin 13.7 13.0 - 18.0 g/dL   HCT 39.4 (L) 40.0 - 52.0 %   MCV 92.5 80.0 - 100.0 fL   MCH 32.0 26.0 - 34.0 pg   MCHC 34.6 32.0 - 36.0 g/dL   RDW 12.9 11.5 - 14.5 %   Platelets 227 150 - 440 K/uL  Urinalysis, Complete w Microscopic  Result Value Ref Range   Color, Urine YELLOW (A) YELLOW   APPearance CLEAR (A) CLEAR   Specific Gravity, Urine 1.014 1.005 - 1.030   pH 6.0 5.0 - 8.0   Glucose, UA NEGATIVE NEGATIVE mg/dL   Hgb urine dipstick NEGATIVE NEGATIVE   Bilirubin Urine NEGATIVE NEGATIVE   Ketones, ur NEGATIVE NEGATIVE mg/dL   Protein, ur NEGATIVE NEGATIVE mg/dL   Nitrite NEGATIVE NEGATIVE   Leukocytes, UA NEGATIVE NEGATIVE   RBC / HPF 0-5 0 - 5 RBC/hpf   WBC, UA 0-5 0 - 5 WBC/hpf   Bacteria, UA NONE SEEN NONE SEEN   Squamous Epithelial / LPF NONE SEEN NONE SEEN  Troponin I  Result Value Ref Range   Troponin I <0.03 <0.03 ng/mL      Assessment & Plan:   Problem List Items Addressed This Visit      Respiratory   COPD (chronic obstructive pulmonary disease) (HCC) Stable, but worsening control with use of rescue inhaler multiple times daily.  Cannot exclude allergy/pollen  component.  Plan: 1. Increase to Symbicort 160-4.5 mcg/act inhaler 2 puffs twice daily 2. Continue incruse 3. Continue albuterol only prn max 6 doses per day. 4. Followup as needed.  Consider pulmonology referral if continues to worsen.   Relevant Medications   budesonide-formoterol (SYMBICORT) 160-4.5 MCG/ACT inhaler    Other Visit Diagnoses    Vertigo    -  Primary Pt with clinical presentation c/w vertigo relieved by meclizine. Likely new lightheadness and difficulty  with balance is side effect of meclizine.  Plan: 1. Continue meclizine, but reduce dose to 12.5 mg tid prn. 2. Should continue adequate hydration.   3. If symptoms not improving after next 2-3 weeks, return to clinic. Information about condition provided in [place].   Relevant Medications   meclizine (ANTIVERT) 12.5 MG tablet   Strain of right rotator cuff capsule, initial encounter     Pain likely self-limited.  Muscle strain possible with possible muscle tear and may require orthopedic referral.  Plan:  1. Treat with OTC pain meds (acetaminophen and ibuprofen).  Discussed alternate dosing and max dosing. 2. Apply heat and/or ice to affected area. 3. May also apply a muscle rub with lidocaine or lidocaine patch after heat or ice. 4. Considered, but deferred muscle relaxer therapy today 2/2 vertigo and meclizine use.  Encouraged self-massage for muscle release. 5. Followup 4-6 weeks prn  Consider physical therapy.  Provided ROM shoulder exercises and demonstrated all in clinic.  Instructions provided in [place].  Information about condition provided in [place].     Trigger point with tension headache     Muscle strain on neck and shoulder.  See ap above shoulder.  Can continue using OTC acetaminophen and ibuprofen for headache.      Meds ordered this encounter  Medications  . DISCONTD: meclizine (ANTIVERT) 12.5 MG tablet    Sig: Take 1 tablet (12.5 mg total) by mouth 3 (three) times daily as needed for  dizziness or nausea.    Dispense:  90 tablet    Refill:  0    Order Specific Question:   Supervising Provider    Answer:   Olin Hauser [[id number]]  . budesonide-formoterol (SYMBICORT) 160-4.5 MCG/ACT inhaler    Sig: Inhale 2 puffs into the lungs 2 (two) times daily.    Dispense:  1 Inhaler    Refill:  3    Order Specific Question:   Supervising Provider    Answer:   Olin Hauser [[id number]]  . meclizine (ANTIVERT) 12.5 MG tablet    Sig: Take 1 tablet (12.5 mg total) by mouth 3 (three) times daily as needed for dizziness or nausea.    Dispense:  90 tablet    Refill:  0    Order Specific Question:   Supervising Provider    Answer:   Olin Hauser [[id number]]      Follow up plan: Return in about 4 weeks (around 07/10/2017) for diabetes, blood pressure, prostate.  [name], DNP, AGPCNP-BC Adult Gerontology Primary Care Nurse Practitioner [facility] 06/12/2017, 5:30 PM

## 2017-06-29 ENCOUNTER — Encounter (INDEPENDENT_AMBULATORY_CARE_PROVIDER_SITE_OTHER): Payer: Self-pay | Admitting: Vascular Surgery

## 2017-06-29 ENCOUNTER — Other Ambulatory Visit: Payer: Self-pay

## 2017-06-29 ENCOUNTER — Ambulatory Visit (INDEPENDENT_AMBULATORY_CARE_PROVIDER_SITE_OTHER): Payer: Medicare Other | Admitting: Vascular Surgery

## 2017-06-29 ENCOUNTER — Ambulatory Visit (INDEPENDENT_AMBULATORY_CARE_PROVIDER_SITE_OTHER): Payer: Medicare Other | Admitting: Nurse Practitioner

## 2017-06-29 ENCOUNTER — Ambulatory Visit: Payer: Medicare Other | Admitting: Nurse Practitioner

## 2017-06-29 ENCOUNTER — Encounter: Payer: Self-pay | Admitting: Nurse Practitioner

## 2017-06-29 VITALS — BP 106/41 | HR 85 | Temp 97.7°F | Ht 60.0 in | Wt 101.2 lb

## 2017-06-29 VITALS — BP 127/66 | HR 76 | Resp 14 | Ht 60.0 in | Wt 102.4 lb

## 2017-06-29 DIAGNOSIS — E119 Type 2 diabetes mellitus without complications: Secondary | ICD-10-CM

## 2017-06-29 DIAGNOSIS — I6523 Occlusion and stenosis of bilateral carotid arteries: Secondary | ICD-10-CM | POA: Diagnosis not present

## 2017-06-29 DIAGNOSIS — R634 Abnormal weight loss: Secondary | ICD-10-CM

## 2017-06-29 DIAGNOSIS — M542 Cervicalgia: Secondary | ICD-10-CM

## 2017-06-29 DIAGNOSIS — R51 Headache: Secondary | ICD-10-CM | POA: Diagnosis not present

## 2017-06-29 DIAGNOSIS — R3914 Feeling of incomplete bladder emptying: Secondary | ICD-10-CM | POA: Diagnosis not present

## 2017-06-29 DIAGNOSIS — Z79899 Other long term (current) drug therapy: Secondary | ICD-10-CM

## 2017-06-29 DIAGNOSIS — R399 Unspecified symptoms and signs involving the genitourinary system: Secondary | ICD-10-CM | POA: Diagnosis not present

## 2017-06-29 DIAGNOSIS — I10 Essential (primary) hypertension: Secondary | ICD-10-CM | POA: Diagnosis not present

## 2017-06-29 DIAGNOSIS — R519 Headache, unspecified: Secondary | ICD-10-CM

## 2017-06-29 DIAGNOSIS — N401 Enlarged prostate with lower urinary tract symptoms: Secondary | ICD-10-CM

## 2017-06-29 DIAGNOSIS — D649 Anemia, unspecified: Secondary | ICD-10-CM

## 2017-06-29 DIAGNOSIS — J431 Panlobular emphysema: Secondary | ICD-10-CM | POA: Diagnosis not present

## 2017-06-29 LAB — POCT GLYCOSYLATED HEMOGLOBIN (HGB A1C): Hemoglobin A1C: 6.4

## 2017-06-29 MED ORDER — TAMSULOSIN HCL 0.4 MG PO CAPS: 0.8000 mg | ORAL_CAPSULE | Freq: Every day | ORAL | 5 refills | Status: DC

## 2017-06-29 NOTE — Progress Notes (Signed)
Subjective:    Patient ID: Jonathan Willis, male    DOB: 20-Dec-1945, 72 y.o.   MRN: [id number]  Jonathan Willis is a 72 y.o. male presenting on 06/29/2017 for Weight Loss (lose 11lbs in 3 mths. pt changed his eating habits, because of his medical condtions )   HPI Weight Loss Patient presents today because of concerns about weight loss.  He states he has lost a lot of weight, but he has only lost 11 pounds in 3 months.  He has changed eating habits to eat healthier with more vegetables because of his diabetes.  In addition to changing eating habits, patient also notes dysgeusia bitter taste in mouth with all food. Typical daily meals: Breakfast, coffee and eats grits or noodles Oatmeal, carrots, soup (broth, vegetables or shrimp/chicken), corn Dinner similar or with fish, chicken, duck.  Dizziness Is still using meclizine 3x per day, but is having some ongoing dizziness.  Meclizine does help symptoms of dizziness when he is able to take it.  BPH with LUTS Continues to urinate 3-4 times per night, having some incontinence as well.  Overall, tamsulosin 0.4 mg helped patient with symptoms.  Patient is still bothered by his lower urinary tract symptoms and desires additional management.  Diabetes Pt presents today for follow up of Type 2 diabetes mellitus. He is not checking CBG at home - Current diabetic medications include: metformin - He is not currently symptomatic.  - He denies polydipsia, polyphagia, polyuria, headaches, diaphoresis, shakiness, chills, pain, numbness or tingling in extremities and changes in vision.   - Clinical course has been stable. - He  reports no regular exercise routine. - His diet is moderate in salt, moderate in fat, and moderate in carbohydrates. - Weight trend: decreasing steadily  PREVENTION: Eye exam current (within one year): no Foot exam current (within one year): no Lipid/ASCVD risk reduction - on statin: atorvastatin Kidney protection - on ace or arb:  losartan Recent Labs    01/10/17 1201 06/29/17 1556  HGBA1C 6.5* 6.4     Social History   Tobacco Use  . Smoking status: Former Smoker    Last attempt to quit: 01/11/2007    Years since quitting: 10.5  . Smokeless tobacco: Never Used  Substance Use Topics  . Alcohol use: No  . Drug use: No    Review of Systems Per HPI unless specifically indicated above     Objective:    BP (!) 106/41 (BP Location: Left Arm, Patient Position: Sitting, Cuff Size: Normal)   Pulse 85   Temp 97.7 F (36.5 C) (Oral)   Ht 5' (1.524 m)   Wt 101 lb 3.2 oz (45.9 kg)   SpO2 100%   BMI 19.76 kg/m   Wt Readings from Last 3 Encounters:  06/29/17 101 lb 3.2 oz (45.9 kg)  06/29/17 102 lb 6.4 oz (46.4 kg)  06/12/17 112 lb (50.8 kg)    Physical Exam  Constitutional: He is oriented to person, place, and time. He appears well-developed and well-nourished. No distress.  HENT:  Head: Normocephalic and atraumatic.  Right Ear: External ear normal.  Left Ear: External ear normal.  Nose: Nose normal.  Mouth/Throat: Oropharynx is clear and moist.  Eyes: Pupils are equal, round, and reactive to light. Conjunctivae are normal.  Neck: Normal range of motion. Neck supple. No JVD present. No tracheal deviation present. No thyromegaly present.  Cardiovascular: Normal rate, regular rhythm, normal heart sounds and intact distal pulses. Exam reveals no gallop and no friction rub.  No murmur heard. Pulmonary/Chest: Effort normal and breath sounds normal. No respiratory distress.  Abdominal: Soft. Bowel sounds are normal. He exhibits no distension. There is no tenderness.  Musculoskeletal: Normal range of motion.  Lymphadenopathy:    He has no cervical adenopathy.  Neurological: He is alert and oriented to person, place, and time. No cranial nerve deficit.  Negative Dix-Hallpike and forward head tilt  Skin: Skin is warm and dry.  Psychiatric: He has a normal mood and affect. His behavior is normal. Judgment  and thought content normal.  Nursing note and vitals reviewed.   Results for orders placed or performed in visit on 06/29/17  COMPLETE METABOLIC PANEL WITH GFR  Result Value Ref Range   Glucose, Bld 154 (H) 65 - 99 mg/dL   BUN 21 7 - 25 mg/dL   Creat 0.98 0.70 - 1.18 mg/dL   GFR, Est Non African American 76 > OR = 60 mL/min/1.73m2   GFR, Est African American 88 > OR = 60 mL/min/1.89m2   BUN/Creatinine Ratio NOT APPLICABLE 6 - 22 (calc)   Sodium 138 135 - 146 mmol/L   Potassium 4.1 3.5 - 5.3 mmol/L   Chloride 101 98 - 110 mmol/L   CO2 30 20 - 32 mmol/L   Calcium 10.0 8.6 - 10.3 mg/dL   Total Protein 7.9 6.1 - 8.1 g/dL   Albumin 4.3 3.6 - 5.1 g/dL   Globulin 3.6 1.9 - 3.7 g/dL (calc)   AG Ratio 1.2 1.0 - 2.5 (calc)   Total Bilirubin 0.5 0.2 - 1.2 mg/dL   Alkaline phosphatase (APISO) 67 40 - 115 U/L   AST 21 10 - 35 U/L   ALT 18 9 - 46 U/L  PSA  Result Value Ref Range   PSA 1.6 < OR = 4.0 ng/mL  CBC with Differential/Platelet  Result Value Ref Range   WBC 6.4 3.8 - 10.8 Thousand/uL   RBC 4.09 (L) 4.20 - 5.80 Million/uL   Hemoglobin 12.6 (L) 13.2 - 17.1 g/dL   HCT 36.9 (L) 38.5 - 50.0 %   MCV 90.2 80.0 - 100.0 fL   MCH 30.8 27.0 - 33.0 pg   MCHC 34.1 32.0 - 36.0 g/dL   RDW 12.4 11.0 - 15.0 %   Platelets 411 (H) 140 - 400 Thousand/uL   MPV 9.5 7.5 - 12.5 fL   Neutro Abs 3,699 1,500 - 7,800 cells/uL   Lymphs Abs 2,253 850 - 3,900 cells/uL   WBC mixed population 269 200 - 950 cells/uL   Eosinophils Absolute 128 15 - 500 cells/uL   Basophils Absolute 51 0 - 200 cells/uL   Neutrophils Relative % 57.8 %   Total Lymphocyte 35.2 %   Monocytes Relative 4.2 %   Eosinophils Relative 2.0 %   Basophils Relative 0.8 %  Lipid panel  Result Value Ref Range   Cholesterol 139 <200 mg/dL   HDL 51 >40 mg/dL   Triglycerides 73 <150 mg/dL   LDL Cholesterol (Calc) 73 mg/dL (calc)   Total CHOL/HDL Ratio 2.7 <5.0 (calc)   Non-HDL Cholesterol (Calc) 88 <130 mg/dL (calc)  TSH  Result  Value Ref Range   TSH 4.11 0.40 - 4.50 mIU/L  Reticulocytes  Result Value Ref Range   Retic Ct Pct 1.0 %   ABS Retic 40,500 25,000 - 9,000 cells/uL  POCT glycosylated hemoglobin (Hb A1C)  Result Value Ref Range   Hemoglobin A1C 6.4       Assessment & Plan:   Problem List Items Addressed  This Visit      Endocrine   Type 2 diabetes mellitus without complication, without long-term current use of insulin ([place]) - Primary    Currently at goal of A1c < 7%.  Pt has diabetes and is currently on metformin.  No recent ophthalmology exam or foot exam.   Plan: 1. Continue metformin 500 mg once daily w/ breakfast. 2. Encourage increased physical activity. 3. Follow up 3 months.      Relevant Orders   POCT glycosylated hemoglobin (Hb A1C) (Completed)   COMPLETE METABOLIC PANEL WITH GFR (Completed)   Lipid panel (Completed)     Genitourinary   Benign prostatic hyperplasia with incomplete bladder emptying    Pt with continued positive LUTS symptoms, but some mild improvement  Plan: 1. Discussed utility of PSA - agrees today.   2. INCREASE flomax 0.8 mg once dailly 3. Continue to monitor urinary symptoms.  If continues, will benefit from urology management. 4. Followup 3 months      Relevant Medications   tamsulosin (FLOMAX) 0.4 MG CAPS capsule   Other Relevant Orders   PSA (Completed)    Other Visit Diagnoses    Weight loss, unintentional     Slow, steady weight loss in response to change in diet.  Unlikely that weight loss is related to any other medical condition.  Dysgeusia also complicates patient's desire to eat.  Plan: 1.  Continue monitoring weight.  If losing an additional 5 pounds call clinic. 2.  Start supplement with Glucerna 3.  Taste should drive food choices.  Eat but tastes good. 4.  Follow-up 3 months or sooner if needed.   Relevant Orders   COMPLETE METABOLIC PANEL WITH GFR (Completed)   PSA (Completed)   CBC with Differential/Platelet (Completed)   Lipid  panel (Completed)   TSH (Completed)   Dizziness Patient was stable, but ongoing dizziness.  Dizziness is relieved with meclizine.  Mildly hypotensive on exam today.  Stop hydrochlorothiazide.  Lower urinary tract symptoms (LUTS)       Relevant Orders   PSA (Completed)   Medication management       Relevant Orders   PSA (Completed)      Meds ordered this encounter  Medications  . tamsulosin (FLOMAX) 0.4 MG CAPS capsule    Sig: Take 2 capsules (0.8 mg total) by mouth daily.    Dispense:  60 capsule    Refill:  5    Order Specific Question:   Supervising Provider    Answer:   Olin Hauser [[id number]]    Follow up plan: Return in about 3 months (around 09/28/2017) for hypertension, COPD.  [name], DNP, AGPCNP-BC Adult Gerontology Primary Care Nurse Practitioner [facility] 07/26/2017, 8:19 PM

## 2017-06-29 NOTE — Patient Instructions (Addendum)
Jonathan Willis,   Thank you for coming in to clinic today.  1. Start rinsing mouth after inhaler.  2. EAT what tastes good.  Supplement with Glucerna brand protein/nutritional shakes for good blood sugar control instead of Ensure. - One multivitamin daily will be ok. - No more weight loss.  If you lose another 5 lbs call me.  3. STOP hydrochlorothiazide for blood pressure.  It is lower than we need it to be with medications.  4. Take tamsulosin 0.4 mg tablet.  Take 2 tablets once daily.  Please schedule a follow-up appointment with [name], AGNP. Return in about 3 months (around 09/28/2017) for hypertension, COPD.  If you have any other questions or concerns, please feel free to call the clinic or send a message through [employer]. You may also schedule an earlier appointment if necessary.  You will receive a survey after today's visit either digitally by e-mail or paper by [employer]. Your experiences and feedback matter to [place].  Please respond so we know how we are doing as we provide care for you.   [name], DNP, AGNP-BC Adult Gerontology Nurse Practitioner [place]

## 2017-06-30 LAB — LIPID PANEL
Cholesterol: 139 mg/dL (ref <200)
HDL: 51 mg/dL (ref >40)
LDL Cholesterol (Calc): 73 mg/dL (calc)
Non-HDL Cholesterol (Calc): 88 mg/dL (calc) (ref <130)
Total CHOL/HDL Ratio: 2.7 (calc) (ref <5.0)
Triglycerides: 73 mg/dL (ref <150)

## 2017-06-30 LAB — COMPLETE METABOLIC PANEL WITH GFR
AG Ratio: 1.2 (calc) (ref 1.0–2.5)
ALT: 18 U/L (ref 9–46)
AST: 21 U/L (ref 10–35)
Albumin: 4.3 g/dL (ref 3.6–5.1)
Alkaline phosphatase (APISO): 67 U/L (ref 40–115)
BUN: 21 mg/dL (ref 7–25)
CO2: 30 mmol/L (ref 20–32)
Calcium: 10 mg/dL (ref 8.6–10.3)
Chloride: 101 mmol/L (ref 98–110)
Creat: 0.98 mg/dL (ref 0.70–1.18)
GFR, Est African American: 88 mL/min/{1.73_m2} (ref >=60)
GFR, Est Non African American: 76 mL/min/{1.73_m2} (ref >=60)
Globulin: 3.6 g/dL (calc) (ref 1.9–3.7)
Glucose, Bld: 154 mg/dL — ABNORMAL HIGH (ref 65–99)
Potassium: 4.1 mmol/L (ref 3.5–5.3)
Sodium: 138 mmol/L (ref 135–146)
Total Bilirubin: 0.5 mg/dL (ref 0.2–1.2)
Total Protein: 7.9 g/dL (ref 6.1–8.1)

## 2017-06-30 LAB — CBC WITH DIFFERENTIAL/PLATELET
Basophils Absolute: 51 cells/uL (ref 0–200)
Basophils Relative: 0.8 %
Eosinophils Absolute: 128 cells/uL (ref 15–500)
Eosinophils Relative: 2 %
HCT: 36.9 % — ABNORMAL LOW (ref 38.5–50.0)
Hemoglobin: 12.6 g/dL — ABNORMAL LOW (ref 13.2–17.1)
Lymphs Abs: 2253 cells/uL (ref 850–3900)
MCH: 30.8 pg (ref 27.0–33.0)
MCHC: 34.1 g/dL (ref 32.0–36.0)
MCV: 90.2 fL (ref 80.0–100.0)
MPV: 9.5 fL (ref 7.5–12.5)
Monocytes Relative: 4.2 %
Neutro Abs: 3699 cells/uL (ref 1500–7800)
Neutrophils Relative %: 57.8 %
Platelets: 411 10*3/uL — ABNORMAL HIGH (ref 140–400)
RBC: 4.09 10*6/uL — ABNORMAL LOW (ref 4.20–5.80)
RDW: 12.4 % (ref 11.0–15.0)
Total Lymphocyte: 35.2 %
WBC mixed population: 269 cells/uL (ref 200–950)
WBC: 6.4 10*3/uL (ref 3.8–10.8)

## 2017-06-30 LAB — RETICULOCYTES
ABS Retic: 40500 cells/uL (ref 25000–9000)
Retic Ct Pct: 1 %

## 2017-06-30 LAB — TSH: TSH: 4.11 mIU/L (ref 0.40–4.50)

## 2017-06-30 LAB — PSA: PSA: 1.6 ng/mL (ref <=4.0)

## 2017-07-01 ENCOUNTER — Encounter (INDEPENDENT_AMBULATORY_CARE_PROVIDER_SITE_OTHER): Payer: Self-pay | Admitting: Vascular Surgery

## 2017-07-01 DIAGNOSIS — R51 Headache: Secondary | ICD-10-CM

## 2017-07-01 DIAGNOSIS — R519 Headache, unspecified: Secondary | ICD-10-CM | POA: Insufficient documentation

## 2017-07-01 DIAGNOSIS — I6529 Occlusion and stenosis of unspecified carotid artery: Secondary | ICD-10-CM | POA: Insufficient documentation

## 2017-07-01 DIAGNOSIS — M542 Cervicalgia: Secondary | ICD-10-CM | POA: Insufficient documentation

## 2017-07-01 NOTE — Progress Notes (Signed)
MRN : [id number]  Jonathan Willis is a 72 y.o. (02-16-1946) male who presents with chief complaint of  Chief Complaint  Patient presents with  . New Patient (Initial Visit)    Carotid stenosis  .  History of Present Illness: The patient is seen for evaluation of carotid stenosis. The carotid stenosis was identified after CT angiogram was done.  Today's visit is performed with a [place] translator by phone as well as the patient's granddaughter for additional clarification and history.  The patient was seen in the emergency room at [facility] on June 03, 2017.  His primary reason for going to the emergency room was neck pain radiating into the left shoulder that was associated with an occipital headache.  Its noted that the headache also will radiate to the top of his head and then to the frontal region.  He describes it as intermittent and extremely intense.  He is also been having difficulties with vertigo that is primarily occurring at the time of his headache.  He was seen by his primary care doctor for the vertigo and he was given meclizine which he reports does help.  He also admits to some visual changes during his headaches.  I did not obtain a history of photophobia.  He denies nausea.  CT angiogram was obtained during his emergency room visit and this shows a 40% right internal carotid artery stenosis and a 30% left internal carotid artery stenosis.  There is also mention of stenosis of the vertebral arteries at their origins.  No other abnormalities are identified as far as the brain and soft tissues.  I have personally reviewed the scan and concur with the assessment of the internal carotid arteries however I do not agree with the assessment of the vertebral arteries.  Both vertebral artery origins appear to have minimal atherosclerotic changes.  The patient denies amaurosis fugax. There is no recent history of TIA symptoms or focal motor deficits. There is no prior  documented CVA.  There is no history of seizures.  The patient is taking enteric-coated aspirin 81 mg daily.  The patient denies a history of coronary artery disease, no recent episodes of angina or shortness of breath. The patient denies PAD or claudication symptoms. There is a history of hyperlipidemia which is being treated with a statin.    Current Meds  Medication Sig  . albuterol (PROVENTIL HFA;VENTOLIN HFA) 108 (90 Base) MCG/ACT inhaler Inhale 2 puffs into the lungs every 6 (six) hours as needed for wheezing or shortness of breath. (Patient not taking: Reported on 06/29/2017)  . aspirin 81 MG chewable tablet Chew by mouth daily.  Marland Kitchen atorvastatin (LIPITOR) 20 MG tablet TAKE 1 TABLET BY MOUTH EVERY DAY  . budesonide-formoterol (SYMBICORT) 160-4.5 MCG/ACT inhaler Inhale 2 puffs into the lungs 2 (two) times daily.  . Lidocaine HCl 3 % LOTN Apply 1 application topically every 6 (six) hours as needed. (Patient not taking: Reported on 06/29/2017)  . losartan (COZAAR) 50 MG tablet Take 1 tablet (50 mg total) by mouth daily.  . meclizine (ANTIVERT) 12.5 MG tablet Take 1 tablet (12.5 mg total) by mouth 3 (three) times daily as needed for dizziness or nausea.  . metFORMIN (GLUCOPHAGE) 500 MG tablet TAKE 1 TABLET BY MOUTH EVERY DAY WITH BREAKFAST  . Spacer/Aero-Hold Chamber Bags MISC 1 Device by Does not apply route every 6 (six) hours as needed (with inhaler).  Marland Kitchen umeclidinium bromide (INCRUSE ELLIPTA) 62.5 MCG/INH AEPB Inhale 1 puff daily  into the lungs.  . [DISCONTINUED] hydrochlorothiazide (HYDRODIURIL) 12.5 MG tablet TAKE 1 TABLET BY MOUTH EVERY DAY  . [DISCONTINUED] tamsulosin (FLOMAX) 0.4 MG CAPS capsule Take 1 capsule (0.4 mg total) by mouth daily.    Past Medical History:  Diagnosis Date  . Chronic cough 01/16/2017  . Diabetes mellitus without complication ([place])   . External hemorrhoid   . 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  History of TB (tuberculosis)   . Hypertension   . Lower GI bleed     Past Surgical History:  Procedure Laterality Date  . COLONOSCOPY WITH PROPOFOL N/A 04/10/2017   Procedure: COLONOSCOPY WITH PROPOFOL;  Surgeon: [name], MD;  Location[facility] ENDOSCOPY;  Service: Gastroenterology;  Laterality: N/A;  . NO PAST SURGERIES      Social History Social History   Tobacco Use  . Smoking status: Former Smoker    Last attempt to quit: 01/11/2007    Years since quitting: 10.4  . Smokeless tobacco: Never Used  Substance Use Topics  . Alcohol use: No  . Drug use: No    Family History Family History  Problem Relation Age of Onset  . COPD Neg Hx   . Diabetes Mellitus II Neg Hx   . Hypertension Neg Hx   No family history of bleeding/clotting disorders, porphyria or autoimmune disease   No Known Allergies   REVIEW OF SYSTEMS (Negative unless checked)  Constitutional: [] Weight loss  [] Fever  [] Chills Cardiac: [] Chest pain   [] Chest pressure   [] Palpitations   [] Shortness of breath when laying flat   [] Shortness of breath with exertion. Vascular:  [] Pain in legs with walking   [] Pain in legs at rest  [] History of DVT   [] Phlebitis   [] Swelling in legs   [] Varicose veins   [] Non-healing ulcers Pulmonary:   [] Uses home oxygen   [] Productive cough   [] Hemoptysis   [] Wheeze  [] COPD   [] Asthma Neurologic:  [x] Dizziness   [] Seizures   [] History of stroke   [] History of TIA  [] Aphasia   [x] Vissual changes   [] Weakness or numbness in arm   [] Weakness or numbness in leg Musculoskeletal:   [] Joint swelling   [x] Neck pain   [] Low back pain Hematologic:  [] Easy bruising  [] Easy bleeding   [] Hypercoagulable state   [] Anemic Gastrointestinal:  [] Diarrhea   [] Vomiting  [] Gastroesophageal reflux/heartburn   [] Difficulty swallowing. Genitourinary:  [] Chronic kidney disease   [] Difficult urination  [] Frequent urination   [] Blood in urine Skin:  [] Rashes   [] Ulcers  Psychological:  [] History of anxiety    []  History of major depression.  Physical Examination  Vitals:   06/29/17 0909  BP: 127/66  Pulse: 76  Resp: 14  Weight: 102 lb 6.4 oz (46.4 kg)  Height: 5' (1.524 m)   Body mass index is 20 kg/m. Gen: WD/WN, NAD Head: [place]/AT, No temporalis wasting.  Ear/Nose/Throat: Hearing grossly intact, nares w/o erythema or drainage, poor dentition Eyes: PER, EOMI, sclera nonicteric.  Neck: Supple, no masses.  No bruit or JVD.  Pulmonary:  Good air movement, clear to auscultation bilaterally, no use of accessory muscles.  Cardiac: RRR, normal S1, S2, no Murmurs. Vascular: No carotid bruits noted Vessel Right Left  Radial Palpable Palpable  Ulnar Palpable Palpable  Brachial Palpable Palpable  Carotid Palpable Palpable  Gastrointestinal: soft, non-distended. No guarding/no peritoneal signs.  Musculoskeletal: M/S 5/5 throughout.  No deformity or atrophy.  Neurologic: CN 2-12 intact. Pain and light  touch intact in extremities.  Symmetrical.  Speech is fluent. Motor exam as listed above. Psychiatric: Judgment intact, Mood & affect appropriate for pt's clinical situation. Dermatologic: No rashes or ulcers noted.  No changes consistent with cellulitis. Lymph : No Cervical lymphadenopathy, no lichenification or skin changes of chronic lymphedema.  CBC Lab Results  Component Value Date   WBC 6.4 06/29/2017   HGB 12.6 (L) 06/29/2017   HCT 36.9 (L) 06/29/2017   MCV 90.2 06/29/2017   PLT 411 (H) 06/29/2017    BMET    Component Value Date/Time   NA 138 06/29/2017 1121   NA 136 02/04/2014 0616   K 4.1 06/29/2017 1121   K 4.1 02/04/2014 0616   CL 101 06/29/2017 1121   CL 102 02/04/2014 0616   CO2 30 06/29/2017 1121   CO2 25 02/04/2014 0616   GLUCOSE 154 (H) 06/29/2017 1121   GLUCOSE 225 (H) 02/04/2014 0616   BUN 21 06/29/2017 1121   BUN 18 02/04/2014 0616   CREATININE 0.98 06/29/2017 1121   CALCIUM 10.0 06/29/2017 1121   CALCIUM 9.1 02/04/2014 0616   GFRNONAA 76 06/29/2017 1121    GFRAA 88 06/29/2017 1121   Estimated Creatinine Clearance: 44.1 mL/min (by C-G formula based on SCr of 0.98 mg/dL).  COAG No results found for: INR, PROTIME  Radiology Ct Angio Head W Or Wo Contrast  Result Date: 06/03/2017 CLINICAL DATA:  Initial evaluation for acute dizziness with standing, headache. EXAM: CT ANGIOGRAPHY HEAD AND NECK TECHNIQUE: Multidetector CT imaging of the head and neck was performed using the standard protocol during bolus administration of intravenous contrast. Multiplanar CT image reconstructions and MIPs were obtained to evaluate the vascular anatomy. Carotid stenosis measurements (when applicable) are obtained utilizing NASCET criteria, using the distal internal carotid diameter as the denominator. CONTRAST:  37mL ISOVUE-370 IOPAMIDOL (ISOVUE-370) INJECTION 76% COMPARISON:  None. FINDINGS: CT HEAD FINDINGS Brain: Mildly advanced cerebral atrophy with chronic small vessel ischemic disease. No acute intracranial hemorrhage. No acute large vessel territory infarct. No mass lesion, midline shift or mass effect. No hydrocephalus. No extra-axial fluid collection. Vascular: No hyperdense vessel. Scattered vascular calcifications noted within the carotid siphons. Skull: Scalp soft tissues and calvarium within normal limits. Sinuses: Paranasal sinuses mastoids are clear. Orbits: Globes normal soft tissues within normal limits. Review of the MIP images confirms the above findings CTA NECK FINDINGS Aortic arch: Visualized aortic arch of normal caliber with normal branch pattern. Extensive noncalcified mural plaque within the visualized arch. No high-grade stenosis about the origin of the great vessels. Focal penetrating plaque noted at the proximal left subclavian artery (series 10, image 267) secondary mild to moderate stenosis of the adjacent true lumen. Visualized subclavian arteries otherwise widely patent. Right carotid system: Fairly long segment eccentric noncalcified plaque  throughout the right common carotid artery with mild diffuse narrowing. Concentric mixed plaque about the right bifurcation/proximal right ICA with associated narrowing of up to 40% by NASCET criteria. Right ICA widely patent distally to the skull base without stenosis, dissection, or occlusion. Left carotid system: Eccentric noncalcified plaque throughout the left common carotid artery with mild multifocal narrowing. Concentric mixed plaque about the left carotid bifurcation/proximal left ICA. Associated relatively mild narrowing of approximately 20-30% by NASCET criteria. 1 cm calcified granuloma present within the left upper lobe. There is irregular linear consolidative opacity with calcifications at the left lung apex ICA patent distally to the skull base without stenosis, dissection or occlusion. Vertebral arteries: Both of the vertebral arteries arise from the  subclavian arteries. Focal severe stenosis at the origin of the left vertebral artery (series 10, image 253). More moderate narrowing at the origin of the right vertebral artery (series 10, image 256). Vertebral arteries otherwise widely patent within the neck. Skeleton: No acute osseous abnormality. No worrisome lytic or blastic osseous lesions. Mild to moderate cervical spondylolysis, most notable at C5-6. Other neck: No acute soft tissue abnormality within the neck. Salivary glands normal. No adenopathy. Thyroid normal. Upper chest: Visualized upper mediastinum demonstrates no acute abnormality. Large 1 cm calcified granuloma at the right upper lobe. Irregular architectural distortion with volume loss and band like opacity at the right lung apex. Associated scattered calcifications. Finding suspected to reflect sequelae of prior granulomatous infection, similar to previous radiographs. Mild irregular apical thickening with calcification noted at the left lung apex as well. Visualized lungs are otherwise clear. Review of the MIP images confirms the  above findings CTA HEAD FINDINGS Anterior circulation: Internal carotid arteries are widely patent to the termini without flow-limiting stenosis. A1 segments, anterior communicating artery common anterior cerebral arteries widely patent. M1 segments widely patent bilaterally. Distal MCA branches well perfused and symmetric. Posterior circulation: Vertebral arteries widely patent to the vertebrobasilar junction. Posterior inferior cerebral arteries patent bilaterally. Basilar artery widely patent to its distal aspect without stenosis. Superior cerebral arteries patent bilaterally. Right PCA supplied via the basilar. Hypoplastic left P1 segment with prominent left posterior communicating artery. PCAs patent to their distal aspects without flow-limiting stenosis. Venous sinuses: Patent. Anatomic variants: None significant. No aneurysm or vascular abnormality. Delayed phase: No pathologic enhancement. Review of the MIP images confirms the above findings IMPRESSION: 1. Negative CTA for large vessel occlusion. No acute vascular abnormality identified. 2. Atheromatous plaque about the carotid bifurcations bilaterally, with associated narrowing of up to 40% on the right and mild 20-30% on the left. 3. Atheromatous plaque at the origin of the vertebral arteries bilaterally with secondary moderate to severe stenoses, left worse than right. Otherwise widely patent vertebrobasilar system. 4. Architectural distortion with scarring at the right lung apex, most likely related to prior granulomatous infection. Electronically Signed   By: Jeannine Boga M.D.   On: 06/03/2017 03:28   Ct Angio Neck W And/or Wo Contrast  Result Date: 06/03/2017 CLINICAL DATA:  Initial evaluation for acute dizziness with standing, headache. EXAM: CT ANGIOGRAPHY HEAD AND NECK TECHNIQUE: Multidetector CT imaging of the head and neck was performed using the standard protocol during bolus administration of intravenous contrast. Multiplanar CT image  reconstructions and MIPs were obtained to evaluate the vascular anatomy. Carotid stenosis measurements (when applicable) are obtained utilizing NASCET criteria, using the distal internal carotid diameter as the denominator. CONTRAST:  80mL ISOVUE-370 IOPAMIDOL (ISOVUE-370) INJECTION 76% COMPARISON:  None. FINDINGS: CT HEAD FINDINGS Brain: Mildly advanced cerebral atrophy with chronic small vessel ischemic disease. No acute intracranial hemorrhage. No acute large vessel territory infarct. No mass lesion, midline shift or mass effect. No hydrocephalus. No extra-axial fluid collection. Vascular: No hyperdense vessel. Scattered vascular calcifications noted within the carotid siphons. Skull: Scalp soft tissues and calvarium within normal limits. Sinuses: Paranasal sinuses mastoids are clear. Orbits: Globes normal soft tissues within normal limits. Review of the MIP images confirms the above findings CTA NECK FINDINGS Aortic arch: Visualized aortic arch of normal caliber with normal branch pattern. Extensive noncalcified mural plaque within the visualized arch. No high-grade stenosis about the origin of the great vessels. Focal penetrating plaque noted at the proximal left subclavian artery (series 10, image 267) secondary mild  to moderate stenosis of the adjacent true lumen. Visualized subclavian arteries otherwise widely patent. Right carotid system: Fairly long segment eccentric noncalcified plaque throughout the right common carotid artery with mild diffuse narrowing. Concentric mixed plaque about the right bifurcation/proximal right ICA with associated narrowing of up to 40% by NASCET criteria. Right ICA widely patent distally to the skull base without stenosis, dissection, or occlusion. Left carotid system: Eccentric noncalcified plaque throughout the left common carotid artery with mild multifocal narrowing. Concentric mixed plaque about the left carotid bifurcation/proximal left ICA. Associated relatively mild  narrowing of approximately 20-30% by NASCET criteria. 1 cm calcified granuloma present within the left upper lobe. There is irregular linear consolidative opacity with calcifications at the left lung apex ICA patent distally to the skull base without stenosis, dissection or occlusion. Vertebral arteries: Both of the vertebral arteries arise from the subclavian arteries. Focal severe stenosis at the origin of the left vertebral artery (series 10, image 253). More moderate narrowing at the origin of the right vertebral artery (series 10, image 256). Vertebral arteries otherwise widely patent within the neck. Skeleton: No acute osseous abnormality. No worrisome lytic or blastic osseous lesions. Mild to moderate cervical spondylolysis, most notable at C5-6. Other neck: No acute soft tissue abnormality within the neck. Salivary glands normal. No adenopathy. Thyroid normal. Upper chest: Visualized upper mediastinum demonstrates no acute abnormality. Large 1 cm calcified granuloma at the right upper lobe. Irregular architectural distortion with volume loss and band like opacity at the right lung apex. Associated scattered calcifications. Finding suspected to reflect sequelae of prior granulomatous infection, similar to previous radiographs. Mild irregular apical thickening with calcification noted at the left lung apex as well. Visualized lungs are otherwise clear. Review of the MIP images confirms the above findings CTA HEAD FINDINGS Anterior circulation: Internal carotid arteries are widely patent to the termini without flow-limiting stenosis. A1 segments, anterior communicating artery common anterior cerebral arteries widely patent. M1 segments widely patent bilaterally. Distal MCA branches well perfused and symmetric. Posterior circulation: Vertebral arteries widely patent to the vertebrobasilar junction. Posterior inferior cerebral arteries patent bilaterally. Basilar artery widely patent to its distal aspect without  stenosis. Superior cerebral arteries patent bilaterally. Right PCA supplied via the basilar. Hypoplastic left P1 segment with prominent left posterior communicating artery. PCAs patent to their distal aspects without flow-limiting stenosis. Venous sinuses: Patent. Anatomic variants: None significant. No aneurysm or vascular abnormality. Delayed phase: No pathologic enhancement. Review of the MIP images confirms the above findings IMPRESSION: 1. Negative CTA for large vessel occlusion. No acute vascular abnormality identified. 2. Atheromatous plaque about the carotid bifurcations bilaterally, with associated narrowing of up to 40% on the right and mild 20-30% on the left. 3. Atheromatous plaque at the origin of the vertebral arteries bilaterally with secondary moderate to severe stenoses, left worse than right. Otherwise widely patent vertebrobasilar system. 4. Architectural distortion with scarring at the right lung apex, most likely related to prior granulomatous infection. Electronically Signed   By: Jeannine Boga M.D.   On: 06/03/2017 03:28   Dg Chest Port 1 View  Result Date: 06/03/2017 CLINICAL DATA:  Acute onset of dizziness and headache. EXAM: PORTABLE CHEST 1 VIEW COMPARISON:  Chest radiograph performed 01/21/2017, and CT of the chest performed 02/01/2014 FINDINGS: The lungs are well-aerated. Scarring is again noted at the right lung apex, with chronic volume loss. There is no evidence of pleural effusion or pneumothorax. The cardiomediastinal silhouette is within normal limits. No acute osseous abnormalities are seen. There  is stable chronic prominence of the right paratracheal soft tissues, reflecting normal vasculature. IMPRESSION: Chronic changes at the right lung apex. No acute cardiopulmonary process seen. Electronically Signed   By: Garald Balding M.D.   On: 06/03/2017 02:17     Assessment/Plan 1. Bilateral carotid artery stenosis Recommend:  Given the patient's asymptomatic  subcritical stenosis no further invasive testing or surgery at this time.  Duplex ultrasound shows <50% stenosis bilaterally.  Continue antiplatelet therapy as prescribed Continue management of CAD, HTN and Hyperlipidemia Healthy heart diet,  encouraged exercise at least 4 times per week Follow up in 6 months with duplex ultrasound and physical exam    A total of 70 minutes was spent with this patient and greater than 50% was spent in counseling and coordination of care with the patient.  Discussion included the treatment options for vascular disease including indications for surgery and intervention.  Also discussed is the appropriate timing of treatment.  In addition medical therapy was discussed.  - VAS US CAROTID; Future  2. Intractable episodic headache, unspecified headache type I believe that his headache neck pain and vertigo are better assessed by a formal neurology evaluation.  I will make the referral.  I have discussed this with the patient.  He voices understanding through the translator.  - Ambulatory referral to Neurology  3. Neck pain See #2  - Ambulatory referral to Neurology  4. Essential hypertension Continue antihypertensive medications as already ordered, these medications have been reviewed and there are no changes at this time.   5. Panlobular emphysema ([place]) Continue pulmonary medications and aerosols as already ordered, these medications have been reviewed and there are no changes at this time.    6. Type 2 diabetes mellitus without complication, without long-term current use of insulin ([facility]CC) Continue hypoglycemic medications as already ordered, these medications have been reviewed and there are no changes at this time.  Hgb A1C to be monitored as already arranged by primary service     Hortencia Pilar, MD  07/01/2017 11:09 AM

## 2017-07-18 ENCOUNTER — Ambulatory Visit: Payer: Medicare Other | Admitting: Nurse Practitioner

## 2017-07-26 ENCOUNTER — Encounter: Payer: Self-pay | Admitting: Nurse Practitioner

## 2017-07-26 NOTE — Assessment & Plan Note (Signed)
Pt with continued positive LUTS symptoms, but some mild improvement  Plan: 1. Discussed utility of PSA - agrees today.   2. INCREASE flomax 0.8 mg once dailly 3. Continue to monitor urinary symptoms.  If continues, will benefit from urology management. 4. Followup 3 months

## 2017-07-26 NOTE — Assessment & Plan Note (Signed)
Currently at goal of A1c < 7%.  Pt has diabetes and is currently on metformin.  No recent ophthalmology exam or foot exam.   Plan: 1. Continue metformin 500 mg once daily w/ breakfast. 2. Encourage increased physical activity. 3. Follow up 3 months.

## 2017-07-31 ENCOUNTER — Other Ambulatory Visit: Payer: Self-pay

## 2017-07-31 DIAGNOSIS — I10 Essential (primary) hypertension: Secondary | ICD-10-CM

## 2017-07-31 MED ORDER — LOSARTAN POTASSIUM 50 MG PO TABS: 50.0000 mg | ORAL_TABLET | Freq: Every day | ORAL | 1 refills | Status: DC

## 2017-08-22 ENCOUNTER — Other Ambulatory Visit: Payer: Self-pay | Admitting: Nurse Practitioner

## 2017-09-01 ENCOUNTER — Other Ambulatory Visit: Payer: Self-pay | Admitting: Nurse Practitioner

## 2017-09-01 DIAGNOSIS — Z13220 Encounter for screening for lipoid disorders: Secondary | ICD-10-CM

## 2017-09-01 DIAGNOSIS — E119 Type 2 diabetes mellitus without complications: Secondary | ICD-10-CM

## 2017-09-04 ENCOUNTER — Other Ambulatory Visit: Payer: Self-pay | Admitting: Nurse Practitioner

## 2017-09-04 DIAGNOSIS — J431 Panlobular emphysema: Secondary | ICD-10-CM

## 2017-09-05 DIAGNOSIS — M7918 Myalgia, other site: Secondary | ICD-10-CM | POA: Diagnosis not present

## 2017-09-05 DIAGNOSIS — E538 Deficiency of other specified B group vitamins: Secondary | ICD-10-CM | POA: Diagnosis not present

## 2017-09-05 DIAGNOSIS — E559 Vitamin D deficiency, unspecified: Secondary | ICD-10-CM | POA: Diagnosis not present

## 2017-09-05 DIAGNOSIS — M5481 Occipital neuralgia: Secondary | ICD-10-CM | POA: Diagnosis not present

## 2017-09-06 ENCOUNTER — Other Ambulatory Visit: Payer: Self-pay | Admitting: Nurse Practitioner

## 2017-09-06 DIAGNOSIS — I10 Essential (primary) hypertension: Secondary | ICD-10-CM

## 2017-09-10 ENCOUNTER — Other Ambulatory Visit: Payer: Self-pay | Admitting: Nurse Practitioner

## 2017-09-10 DIAGNOSIS — R3914 Feeling of incomplete bladder emptying: Principal | ICD-10-CM

## 2017-09-10 DIAGNOSIS — N401 Enlarged prostate with lower urinary tract symptoms: Secondary | ICD-10-CM

## 2017-09-13 ENCOUNTER — Other Ambulatory Visit: Payer: Self-pay

## 2017-09-22 ENCOUNTER — Other Ambulatory Visit: Payer: Self-pay | Admitting: Nurse Practitioner

## 2017-09-22 DIAGNOSIS — J431 Panlobular emphysema: Secondary | ICD-10-CM

## 2017-09-25 ENCOUNTER — Encounter: Payer: Self-pay | Admitting: Nurse Practitioner

## 2017-09-25 ENCOUNTER — Other Ambulatory Visit: Payer: Self-pay

## 2017-09-25 ENCOUNTER — Ambulatory Visit (INDEPENDENT_AMBULATORY_CARE_PROVIDER_SITE_OTHER): Payer: Medicare Other | Admitting: Nurse Practitioner

## 2017-09-25 VITALS — BP 117/47 | HR 77 | Temp 98.3°F | Ht 60.0 in | Wt 106.8 lb

## 2017-09-25 DIAGNOSIS — R51 Headache: Secondary | ICD-10-CM

## 2017-09-25 DIAGNOSIS — M542 Cervicalgia: Secondary | ICD-10-CM | POA: Diagnosis not present

## 2017-09-25 DIAGNOSIS — I10 Essential (primary) hypertension: Secondary | ICD-10-CM

## 2017-09-25 DIAGNOSIS — R519 Headache, unspecified: Secondary | ICD-10-CM

## 2017-09-25 DIAGNOSIS — J431 Panlobular emphysema: Secondary | ICD-10-CM

## 2017-09-25 DIAGNOSIS — N401 Enlarged prostate with lower urinary tract symptoms: Secondary | ICD-10-CM | POA: Diagnosis not present

## 2017-09-25 DIAGNOSIS — E119 Type 2 diabetes mellitus without complications: Secondary | ICD-10-CM | POA: Diagnosis not present

## 2017-09-25 DIAGNOSIS — R3914 Feeling of incomplete bladder emptying: Secondary | ICD-10-CM

## 2017-09-25 LAB — POCT GLYCOSYLATED HEMOGLOBIN (HGB A1C): Hemoglobin A1C: 5.9 % — AB (ref 4.0–5.6)

## 2017-09-25 MED ORDER — TAMSULOSIN HCL 0.4 MG PO CAPS: 0.8000 mg | ORAL_CAPSULE | Freq: Every day | ORAL | 5 refills | Status: DC

## 2017-09-25 MED ORDER — BLOOD GLUCOSE METER KIT: PACK | 0 refills | Status: DC

## 2017-09-25 MED ORDER — BACLOFEN 10 MG PO TABS: 10.0000 mg | ORAL_TABLET | Freq: Three times a day (TID) | ORAL | 2 refills | Status: DC

## 2017-09-25 MED ORDER — HYDROCHLOROTHIAZIDE 12.5 MG PO TABS: 12.5000 mg | ORAL_TABLET | Freq: Every day | ORAL | 1 refills | Status: DC

## 2017-09-25 MED ORDER — METOPROLOL TARTRATE 50 MG PO TABS: 50.0000 mg | ORAL_TABLET | Freq: Two times a day (BID) | ORAL | 1 refills | Status: DC

## 2017-09-25 NOTE — Assessment & Plan Note (Signed)
Headache likely neuropathic with neck pain.  Worsened likely also by increased neck muscle tension.  Patient receiving benefit from baclofen, but is not taking twice daily as ordered.  Plan: 1. Take baclofen 10 mg tid.  ALWAYS take at least bid.  Refill provided. 2. Continue neurology follow-up as scheduled. 3. Follow-up in clinic here prn.

## 2017-09-25 NOTE — Assessment & Plan Note (Signed)
Controlled on current medication regimen with mild increase in symptoms with heat and poor air quality. Patient continues to perform normal daily activities and has increased exercise.    Plan: 1. Encouraged regular physical activity. 2.  Continue Incruse - Take one inhalation once daily. 3. Continue Symbicort - Take 2 puffs twice daily. 4. Continue albuterol prn.  Reinforced use during periods with worsening pulmonary function 5. Followup in 3 months

## 2017-09-25 NOTE — Assessment & Plan Note (Signed)
Pt with continued positive LUTS symptoms, but some mild improvement  Plan: 1. Discussed utility of PSA was WNL at last check and will not need repeat today.  Will repeat at next visit in 3 months.   2. INCREASE to flomax 0.8 mg once daily.  Patient did not increase after last visit as instructed 3. Continue to monitor urinary symptoms.  If continues, will benefit from urology management. 4. Followup 3 months

## 2017-09-25 NOTE — Assessment & Plan Note (Signed)
Stable and controlled.  Currently at goal of A1c < 7%.  Pt has diabetes and is currently on metformin.  No recent ophthalmology exam or foot exam.  Good lifestyle management with change from white rice to brown rice.  Plan: 1. STOP metformin 500 mg once daily. 2. START checking CBG fasting in am.  Goal 80-120.  If regularly over 120, will resume metformin.  Patient to call clinic. 3. Encourage increased physical activity, reinforced low glycemic diet. 4. Continue ARB, statin.  5.  Foot exam normal today.  Ophthalmology exam requested. 6. Follow up 3 months.

## 2017-09-25 NOTE — Assessment & Plan Note (Signed)
Controlled and at goal of < 130/80.  Pt is currently on hydrochlorothiazide, losartan, and metoprolol, which he is tolerating well without side effects  Plan: 1. Continue hydrochlorothiazide 12.5 mg once daily.  - Continue losartan 50 mg once daily. - Continue metoprolol 50 mg bid 2. Labs at next visit.  Last labs with normal kidney function. 3. Follow-up 3 months.

## 2017-09-25 NOTE — Patient Instructions (Addendum)
Jonathan Willis,   Thank you for coming in to clinic today.  1. Diabetes: - Meter order is given.  Normal fasting morning blood sugar is 80-120.  If lower than 70, eat a snack / meal.  Only need to check in morning before breakfast.  Instructions are in [place] below. - Stop metformin  2. For urine: - take tamsulosin 0.4 mg tablet.  Take 2 tablets once daily.  3. For neck pain: - May take baclofen 10 mg three times daily.  Always take twice.  May take 3 times daily if needed for neck pain or headache.  4. COPD: - take albuterol as needed.  It is ok to use this more often on hot or poor air quality days. - continue Incruse and Symbicort  5. Your provider would like to you have your annual eye exam. Please contact your current eye doctor or here are some good options for you to contact.  Let the office know you have diabetes and medicare/medicaid.  This should cover your eye exam.  [facility]   Address: [address] [place], [place] [address] Phone: [phone]  Website: [email or web address] [address], [place], [place] [address] Phone: [phone] [email or web address]  [facility]  Address: [place], [place], [place] [address] Phone: [phone]   [facility] [address] [place], [place] [address] Phone: [phone]  [facility] Address: [place], [place], [place] [address]  Phone: [phone]   Please schedule a follow-up appointment with [name], AGNP. Return in about 3 months (around 12/26/2017) for diabetes, hypertension, COPD.  If you have any other questions or concerns, please feel free to call the clinic or send a message through [place]. You may also schedule an earlier appointment if necessary.  You will receive a survey after today's visit either digitally by e-mail or paper by [employer]. Your experiences and feedback matter to [place].  Please respond so we know how we are doing as we  provide care for you.   [name], DNP, AGNP-BC Adult Gerontology Nurse Practitioner [facility], [place] ???ng huy?t, Ng??i l?n (Blood Glucose Monitoring, Adult) Theo di ???ng huy?t (???ng glucose) gip qu v? qu?n l b?nh ti?u ???ng c?a mnh. Vi?c ny c?ng gip qu v? v chuyn gia ch?m [place] s?c kh?e c?a qu v? xc ??nh k? ho?ch qu?n l ti?u ???ng c?a qu v? hi?u qu? ??n ?u. Theo di ???ng huy?t bao g?m vi?c ki?m tra ???ng huy?t th??ng xuyn nh? ch? d?n v duy tr vi?c ghi chp (nh?t k) cc k?t qu? c?a qu v? theo th?i gian. T?I SAO TI NN THEO DI ???NG HUY?T C?A TI? Vi?c ki?m tra ??u ??n ???ng huy?t c?a qu v? c th?:  Gip qu v? hi?u r th?c ?n, t?p th? d?c, b?nh t?t v thu?c men ?nh h??ng nh? th? no ??n ???ng huy?t c?a qu v?.  Cho qu v? bi?t v? tnh tr?ng ???ng huy?t c?a qu v? b?t k? lc no. Qu v? c th? nhanh chng ni qu v? ?ang c ???ng huy?t th?p (h? ???ng huy?t) hay ???ng huy?t cao (t?ng ???ng huy?t).  Gip qu v? v chuyn gia ch?m [place] s?c kh?e ?i?u ch?nh thu?c c?a qu v? n?u c?n thi?t. KHI NO TI NN KI?M TRA ???NG HUY?T? Tun th? ch? d?n c?a chuyn gia ch?m [place] s?c kh?e v? t?n su?t ki?m tra ???ng  huy?t. ?i?u na?y c th? phu? thu?c va?o:  Lo?i ti?u ???ng c?a qu v?.  B?nh ti?u ???ng c?a qu v? ???c ki?m sot t?t nh? th? no.  Thu?c qu v? ?ang dng. N?u qu v? b? ti?u ???ng tup 1:  Ki?m tra ???ng huy?t t nh?t 2 l?n m?i ngy.  C?ng ki?m tra ???ng huy?t c?a qu v?: ? Tr??c m?i l?n tim insulin. ? Tr??c v sau khi t?p th? d?c. ? Gi?a cc b?a ?n. ? 2 gi? sau b?a ?n. ? Th?nh tho?ng t? 2:00 sng ??n 3:00 sng, theo ch? d?n. ? Tr??c khi th?c hi?n nh?ng nhi?m v? ti?m ?n nguy hi?m, nh? li xe ho?c v?n hnh my mc h?ng n?ng. ? Lc ?i ng?.  Qu v? c th? c?n ki?m tra ???ng huy?t th??ng xuyn h?n, ln t?i 6-10 l?n m?t ngy: ? N?u quy? vi? s? d?ng m?t my tim insulin. ? N?u quy? vi? c?n tim nhi?u l?n m?i ngy  (MDI). ? N?u b?nh ti?u ???ng c?a qu v? khng ???c ki?m sot t?t. ? N?u quy? vi? ?ang b? b?nh. ? N?u qu v? c ti?n s? b? h? ???ng huy?t n?ng. ? N?u qu v? c ti?n s? khng bi?t khi no ???ng huy?t c?a mnh s? b? h? (h? ???ng huy?t v th?c). N?u qu v? b? ti?u ???ng tup 2:  N?u qu v? tim insulin ho?c dng cc thu?c tr? ti?u ???ng khc, hy ki?m tra ???ng huy?t t nh?t 2 l?n m?i ngy.  N?u qu v? ?ang p d?ng li?u php insulin tch c?c, hy ki?m tra ???ng huy?t c?a qu v? t nh?t 4 l?n m?i ngy. Th?nh tho?ng, qu v? c?ng c th? c?n ki?m tra trong kho?ng th?i gian t? 2:00 sng ??n 3:00 gi? sng, theo h??ng d?n.  C?ng ki?m tra ???ng huy?t c?a qu v?: ? Tr??c v sau khi t?p th? d?c. ? Tr??c khi th?c hi?n nh?ng nhi?m v? ti?m ?n nguy hi?m, nh? li xe ho?c v?n hnh my mc h?ng n?ng.  Qu v? c th? c?n ki?m tra ???ng huy?t th??ng xuyn h?n, n?u: ? Thu?c c?a qu v? ???c ?i?u ch?nh. ? B?nh ti?u ???ng c?a qu v? khng ???c ki?m sot t?t. ? Qu v? ?ang b? ?m. NH?T K ???NG HUY?T L G?  Nh?t k ???ng huy?t l ghi chp v? cc ch? s? ???ng huy?t c?a qu v?. Cu?n nh?t k ? s? gip qu v? v chuyn gia ch?m [place] s?c kh?e c?a qu v?: ? Xem bi?u ?? ???ng huy?t c?a qu v? theo th?i gian. ? ?i?u ch?nh k? ho?ch qu?n l ti?u ???ng n?u c?n thi?t.  M?i l?n qu v? ki?m tra ???ng huy?t, hy vi?t ra k?t qu? v ghi ch v? nh?ng th? c th? ?nh h??ng ??n ???ng huy?t c?a qu v?, ch?ng h?n nh? ch? ?? ?n v bi t?p cho ngy hm ?Marland Kitchen  H?u h?t cc my ?o ???ng huy?t ??u l?u tr? h? s? v? ch? s? ???ng huy?t trong my. M?t s? my ?o cn cho php qu v? t?i h? s? v? my vi tnh.  TI KI?M TRA ???NG HUY?T C?A MNH NH? TH? NO? Lm theo cc b??c sau ?y ?? nh?n ch? s? ???ng huy?t chnh xc: V?t d?ng c?n thi?t  My ?o ???ng huy?t.  Que th? ?? ?o ???ng huy?t c?a qu v?. M?i my ?o c cc que th? ring. Qu v? ph?i s? d?ng cc que th? ?i km my ?o c?a mnh.  M?t m?i kim ?? chm  vo ngn tay (m?i chch). Khng s? d?ng  m?i chch qu m?t l?n.  D?ng c? gi? m?i trch (d?ng c? trch).  M?t cu?n nh?t k ghi l?i k?t qu? c?a qu v?. Th? thu?t  R?a tay qu v? b?ng x phng v n??c.  Chm m?i chch vo ph?n bn c?nh c?a ngn tay (khng ph?i ??u ngn tay). M?i l?n s? d?ng m?t ngn tay khc nhau.  N?n nh? nhng ngn tay cho t?i khi m?t gi?t mu nh? xu?t hi?n.  Lm theo cc h??ng d?n km theo my ?o c?a qu v? ?? gi que th?, bi mu vo que th? v dng my ?o ???ng huy?t c?a qu v?.  Vi?t k?t qu? v b?t k? ghi ch no. Cc v? tr xt nghi?m thay th?  M?t s? my ?o cho php qu v? s? d?ng cc vng c? th? ngoi ngn tay (cc v? tr thay th?) ?? xt nghi?m mu.  N?u qu v? ngh? l c th? b? h? ???ng huy?t, ho?c n?u qu v? b? h? ???ng huy?t v th?c, khng s? d?ng cc v? tr thay th?. Thay vo ?, hy s? d?ng ngn tay.  Cc v? tr thay th? c th? khng chnh xc nh? cc ngn tay, b?i v dng mu ? nh?ng khu v?c ny ch?y ch?m h?n. ?i?u ny c ngh?a r?ng k?t qu? qu v? nh?n ???c c th? b? ch?m tr? v n c th? khc so v?i k?t qu? qu v? nh?n ???c t? xt nghi?m ? ngn tay qu v?.  Nh?ng v? tr thay th? ph? bi?n nh?t l: ? C?ng tay. ? ?i. ? Lng bn tay. Cc ??u mt khc  Lun lun mang theo cc v?t d?ng cung c?p c?a qu v?.  T?t c? my ?o ???ng huy?t ??u c m?t s? ?i?n tho?i "???ng dy nng" tr?c 24 gi? ?? g?i n?u qu v? c cu h?i ho?c c?n tr? gip. Quy? vi? c?ng co? th? lin h? v?i chuyn gia ch?m so?c s??c kho?e c?a mnh.  Sau khi qu v? s? d?ng m?t vi h?p que th?, hy ?i?u ch?nh (hi?u ch?nh) my ?o ???ng huy?t theo h??ng d?n ?i km theo my. Thng tin ny khng nh?m m?c ?ch thay th? cho l?i khuyn m chuyn gia ch?m [place] s?c kh?e ni v?i qu v?. Hy b?o ??m qu v? ph?i th?o lu?n b?t k? v?n ?? g m qu v? c v?i chuyn gia ch?m [place] s?c kh?e c?a qu v?. Document Released: 04/10/2015 Document Reviewed: 08/24/2015 Elsevier Interactive Patient Education  2017 [employer].

## 2017-09-25 NOTE — Progress Notes (Signed)
Subjective:    Patient ID: Jonathan Willis, male    DOB: November 19, 1945, 72 y.o.   MRN: [id number]  Anil Havard is a 72 y.o. male presenting on 09/25/2017 for Hypertension; Diabetes; and Headache (neck pain that worsen w/ movement, fatigue x 2 weeks )   HPI Hypertension  - He is checking BP at home or outside of clinic.  Readings 130/55 usually at home - Current medications: hctz 12.5 mg once daily, metoprolol 50 mg once daily, tolerating well without side effects - He is not currently symptomatic. - Pt denies headache, lightheadedness, dizziness, changes in vision, chest tightness/pressure, palpitations, leg swelling, sudden loss of speech or loss of consciousness. - He  reports an exercise routine that includes yardwork, housework, 6-7 days per week. - His diet is moderate in salt, moderate in fat, and moderate in carbohydrates.   He has changed from white rice and bread to brown rice and whole wheat breads.  Diabetes Pt presents today for follow up of Type 2 diabetes mellitus. He is not checking CBG at home - Current diabetic medications include: metformin 500 mg once daily - He is not currently symptomatic.  - He denies polydipsia, polyphagia, polyuria, headaches, diaphoresis, shakiness, chills, pain, numbness or tingling in extremities and changes in vision.   - Clinical course has been improving. - Weight trend: stable  PREVENTION: Eye exam current (within one year): no Foot exam current (within one year): yes  Lipid/ASCVD risk reduction - on statin: yes Kidney protection - on ace or arb: yes Recent Labs    01/10/17 1201 06/29/17 1556 09/25/17 0853  HGBA1C 6.5* 6.4 5.9*     Headache Patient has had neuro consult with recommendations for baclofen for occiptal neuralgia.  This has provided relief in past.  He is currently taking baclofen 10 mg once daily and only occasionally twice daily.  Last week he took twice daily and had more relief of symptoms.  Review of neurology note indicates  patient should be allowed to increase dose up to 80 mg daily.  BPH Continues to have incomplete bladder emptying.  Flomax has helped symptoms, so patient requests if higher dose is possible.  He specifically is bothered by frequency and void with post void residual requiring repeat urination.  COPD Breathing - worse in the last 2 weeks.  More fatigue, staying active.  Poor air quality and increased heat over last 2 weeks here.  Has used albuterol rarely, but with relief.    Social History   Tobacco Use  . Smoking status: Former Smoker    Last attempt to quit: 01/11/2007    Years since quitting: 10.7  . Smokeless tobacco: Never Used  Substance Use Topics  . Alcohol use: No  . Drug use: No    Review of Systems Per HPI unless specifically indicated above     Objective:    BP (!) 117/47 (BP Location: Right Arm)   Pulse 77   Temp 98.3 F (36.8 C) (Oral)   Ht 5' (1.524 m)   Wt 106 lb 12.8 oz (48.4 kg)   SpO2 100%   BMI 20.86 kg/m    Wt Readings from Last 3 Encounters:  09/25/17 106 lb 12.8 oz (48.4 kg)  06/29/17 101 lb 3.2 oz (45.9 kg)  06/29/17 102 lb 6.4 oz (46.4 kg)    Physical Exam  Constitutional: He is oriented to person, place, and time. He appears well-developed and well-nourished. No distress.  HENT:  Head: Normocephalic and atraumatic.  Neck: Normal  range of motion. Neck supple. Carotid bruit is not present.  Cardiovascular: Normal rate, regular rhythm, S1 normal, S2 normal, normal heart sounds and intact distal pulses.  Pulmonary/Chest: Effort normal and breath sounds normal. No respiratory distress.  Musculoskeletal: He exhibits no edema (pedal).  Neurological: He is alert and oriented to person, place, and time.  Skin: Skin is warm and dry. Capillary refill takes less than 2 seconds.  Psychiatric: He has a normal mood and affect. His behavior is normal.  Vitals reviewed.  Diabetic Foot Exam - Simple   Simple Foot Form Diabetic Foot exam was performed  with the following findings:  Yes 09/25/2017  8:53 AM  Visual Inspection No deformities, no ulcerations, no other skin breakdown bilaterally:  Yes Sensation Testing Intact to touch and monofilament testing bilaterally:  Yes Pulse Check Posterior Tibialis and Dorsalis pulse intact bilaterally:  Yes Comments     Results for orders placed or performed in visit on 09/25/17  POCT glycosylated hemoglobin (Hb A1C)  Result Value Ref Range   Hemoglobin A1C 5.9 (A) 4.0 - 5.6 %   HbA1c POC (<> result, manual entry)  4.0 - 5.6 %   HbA1c, POC (prediabetic range)  5.7 - 6.4 %   HbA1c, POC (controlled diabetic range)  0.0 - 7.0 %      Assessment & Plan:   Problem List Items Addressed This Visit      Cardiovascular and Mediastinum   Essential hypertension    Controlled and at goal of < 130/80.  Pt is currently on hydrochlorothiazide, losartan, and metoprolol, which he is tolerating well without side effects  Plan: 1. Continue hydrochlorothiazide 12.5 mg once daily.  - Continue losartan 50 mg once daily. - Continue metoprolol 50 mg bid 2. Labs at next visit.  Last labs with normal kidney function. 3. Follow-up 3 months.      Relevant Medications   hydrochlorothiazide (HYDRODIURIL) 12.5 MG tablet   metoprolol tartrate (LOPRESSOR) 50 MG tablet     Respiratory   COPD (chronic obstructive pulmonary disease) (HCC)    Controlled on current medication regimen with mild increase in symptoms with heat and poor air quality. Patient continues to perform normal daily activities and has increased exercise.    Plan: 1. Encouraged regular physical activity. 2.  Continue Incruse - Take one inhalation once daily. 3. Continue Symbicort - Take 2 puffs twice daily. 4. Continue albuterol prn.  Reinforced use during periods with worsening pulmonary function 5. Followup in 3 months        Endocrine   Type 2 diabetes mellitus without complication, without long-term current use of insulin ([place]) - Primary     Stable and controlled.  Currently at goal of A1c < 7%.  Pt has diabetes and is currently on metformin.  No recent ophthalmology exam or foot exam.  Good lifestyle management with change from white rice to brown rice.  Plan: 1. STOP metformin 500 mg once daily. 2. START checking CBG fasting in am.  Goal 80-120.  If regularly over 120, will resume metformin.  Patient to call clinic. 3. Encourage increased physical activity, reinforced low glycemic diet. 4. Continue ARB, statin.  5.  Foot exam normal today.  Ophthalmology exam requested. 6. Follow up 3 months.      Relevant Medications   blood glucose meter kit and supplies   Other Relevant Orders   POCT glycosylated hemoglobin (Hb A1C) (Completed)     Genitourinary   Benign prostatic hyperplasia with incomplete bladder emptying  Pt with continued positive LUTS symptoms, but some mild improvement  Plan: 1. Discussed utility of PSA was WNL at last check and will not need repeat today.  Will repeat at next visit in 3 months.   2. INCREASE to flomax 0.8 mg once daily.  Patient did not increase after last visit as instructed 3. Continue to monitor urinary symptoms.  If continues, will benefit from urology management. 4. Followup 3 months      Relevant Medications   tamsulosin (FLOMAX) 0.4 MG CAPS capsule     Other   Headache    Headache likely neuropathic with neck pain.  Worsened likely also by increased neck muscle tension.  Patient receiving benefit from baclofen, but is not taking twice daily as ordered.  Plan: 1. Take baclofen 10 mg tid.  ALWAYS take at least bid.  Refill provided. 2. Continue neurology follow-up as scheduled. 3. Follow-up in clinic here prn.      Relevant Medications   baclofen (LIORESAL) 10 MG tablet   metoprolol tartrate (LOPRESSOR) 50 MG tablet   Neck pain   Relevant Medications   baclofen (LIORESAL) 10 MG tablet      Meds ordered this encounter  Medications  . baclofen (LIORESAL) 10 MG tablet      Sig: Take 1 tablet (10 mg total) by mouth 3 (three) times daily.    Dispense:  90 tablet    Refill:  2    Order Specific Question:   Supervising Provider    Answer:   Olin Hauser [[id number]]  . tamsulosin (FLOMAX) 0.4 MG CAPS capsule    Sig: Take 2 capsules (0.8 mg total) by mouth daily.    Dispense:  60 capsule    Refill:  5    Order Specific Question:   Supervising Provider    Answer:   Olin Hauser [[id number]]  . blood glucose meter kit and supplies    Sig: Dispense based on patient and insurance preference. Use one time daily as directed. (FOR ICD-10 E10.9, E11.9).    Dispense:  1 each    Refill:  0    Order Specific Question:   Supervising Provider    Answer:   Olin Hauser [[id number]]    Order Specific Question:   Number of strips    Answer:   100    Order Specific Question:   Number of lancets    Answer:   100  . hydrochlorothiazide (HYDRODIURIL) 12.5 MG tablet    Sig: Take 1 tablet (12.5 mg total) by mouth daily.    Dispense:  90 tablet    Refill:  1    Order Specific Question:   Supervising Provider    Answer:   Olin Hauser [[id number]]  . metoprolol tartrate (LOPRESSOR) 50 MG tablet    Sig: Take 1 tablet (50 mg total) by mouth 2 (two) times daily.    Dispense:  180 tablet    Refill:  1    Order Specific Question:   Supervising Provider    Answer:   Olin Hauser [[id number]]   Follow up plan: Return in about 3 months (around 12/26/2017) for diabetes, hypertension, COPD.  [name], DNP, AGPCNP-BC Adult Gerontology Primary Care Nurse Practitioner [facility] 09/25/2017, 12:39 PM

## 2017-10-22 ENCOUNTER — Other Ambulatory Visit: Payer: Self-pay | Admitting: Family Medicine

## 2017-10-22 DIAGNOSIS — E119 Type 2 diabetes mellitus without complications: Secondary | ICD-10-CM

## 2017-11-30 DIAGNOSIS — Z961 Presence of intraocular lens: Secondary | ICD-10-CM | POA: Diagnosis not present

## 2017-11-30 DIAGNOSIS — E119 Type 2 diabetes mellitus without complications: Secondary | ICD-10-CM | POA: Diagnosis not present

## 2017-11-30 DIAGNOSIS — H43393 Other vitreous opacities, bilateral: Secondary | ICD-10-CM | POA: Diagnosis not present

## 2017-11-30 LAB — HM DIABETES EYE EXAM

## 2017-12-04 ENCOUNTER — Telehealth: Payer: Self-pay

## 2017-12-04 NOTE — Telephone Encounter (Signed)
Open in error

## 2017-12-04 NOTE — Progress Notes (Signed)
[facility]     Assessment and Plan:  COPD, group B. -Continue current inhalers, which include Symbicort, Incruz. - I discussed with the patient and his daughter that he should try to increase his physical activity, walking for 20-30 minutes/day.  --prevnar 13 12/2016.  --Not candidate for lung cancer screening.   Date: 12/04/2017  MRN# [id number] Jonathan Willis 07/16/1945    Jonathan Willis is a 72 y.o. old male seen in consultation for chief complaint of:    Chief Complaint  Patient presents with  . COPD    pt c/o occasional non productive cough but he says he is tired. Pt denies sob and or wheeze.    HPI:   The patient is a 72 year old male with a history of COPD.  He was last seen in October 2018, at that time was admitted to the hospital for acute exacerbation of COPD, he was evaluated by our service thought to have likely ACOS and due to elevated eosinophil count. He is present with vietnamese interpreter today who provides history.   Today he feels tired because he has not been sleeping well at night, breathing feels ok. He is using his symbicort bid and incruse once daily and feels that they are helping. He only sleeps around 3 hours per night.  He denies snoring, he does not take naps during the day.    04/05/17 Desat walk on RA>> at rest, sat 99% and HR 81. Walked 360 feet at brisk pace, minimal dyspnea, sat was 96% and HR 88.   CBC eosiniphil count = 700 01/21/17  Medication:    Current Outpatient Medications:  .  ACCU-CHEK AVIVA PLUS test strip, TEST ONCE DAILY, Disp: 50 each, Rfl: 3 .  albuterol (PROVENTIL HFA;VENTOLIN HFA) 108 (90 Base) MCG/ACT inhaler, Inhale 2 puffs into the lungs every 6 (six) hours as needed for wheezing or shortness of breath., Disp: 1 Inhaler, Rfl: 2 .  aspirin 81 MG chewable tablet, Chew by mouth daily., Disp: , Rfl:  .  atorvastatin (LIPITOR) 20 MG tablet, TAKE 1 TABLET BY MOUTH EVERY DAY, Disp: 90 tablet, Rfl: 1 .  baclofen  (LIORESAL) 10 MG tablet, Take 1 tablet (10 mg total) by mouth 3 (three) times daily., Disp: 90 tablet, Rfl: 2 .  blood glucose meter kit and supplies, Dispense based on patient and insurance preference. Use one time daily as directed. (FOR ICD-10 E10.9, E11.9)., Disp: 1 each, Rfl: 0 .  hydrochlorothiazide (HYDRODIURIL) 12.5 MG tablet, Take 1 tablet (12.5 mg total) by mouth daily., Disp: 90 tablet, Rfl: 1 .  INCRUSE ELLIPTA 62.5 MCG/INH AEPB, INHALE 1 PUFF DAILY INTO THE LUNGS., Disp: 30 each, Rfl: 6 .  Lidocaine HCl 3 % LOTN, Apply 1 application topically every 6 (six) hours as needed., Disp: 1 Tube, Rfl: 5 .  losartan (COZAAR) 50 MG tablet, Take 1 tablet (50 mg total) by mouth daily., Disp: 90 tablet, Rfl: 1 .  meclizine (ANTIVERT) 12.5 MG tablet, Take 1 tablet (12.5 mg total) by mouth 3 (three) times daily as needed for dizziness or nausea., Disp: 90 tablet, Rfl: 0 .  metoprolol tartrate (LOPRESSOR) 50 MG tablet, Take 1 tablet (50 mg total) by mouth 2 (two) times daily., Disp: 180 tablet, Rfl: 1 .  Spacer/Aero-Hold [employer], 1 Device by Does not apply route every 6 (six) hours as needed (with inhaler)., Disp: 1 each, Rfl: 1 .  SYMBICORT 160-4.5 MCG/ACT inhaler, TAKE 2 PUFFS BY MOUTH TWICE A DAY, Disp: 10.2 Inhaler,  Rfl: 5 .  tamsulosin (FLOMAX) 0.4 MG CAPS capsule, Take 2 capsules (0.8 mg total) by mouth daily., Disp: 60 capsule, Rfl: 5   Allergies:  Patient has no known allergies.  Review of Systems:  Constitutional: Feels well. Cardiovascular: No chest pain.  Pulmonary: Denies hemoptysis. The remainder of systems were reviewed and were found to be negative other than what is documented in the HPI.   Physical Examination:   VS: BP 128/82 (BP Location: Left Arm, Cuff Size: Normal)   Pulse (!) 56   Resp 16   Ht 5' (1.524 m)   Wt 104 lb (47.2 kg)   SpO2 99%   BMI 20.31 kg/m   General Appearance: No distress  Neuro:without focal findings, mental status, speech normal, alert  and oriented HEENT: PERRLA, EOM intact Pulmonary: No wheezing, No rales  CardiovascularNormal S1,S2.  No m/r/g.  Abdomen: Benign, Soft, non-tender, No masses Renal:  No costovertebral tenderness  GU:  No performed at this time. Endoc: No evident thyromegaly, no signs of acromegaly or Cushing features Skin:   warm, no rashes, no ecchymosis  Extremities: normal, no cyanosis, clubbing.   Other findings:    LABORATORY PANEL:   CBC No results for input(s): WBC, HGB, HCT, PLT in the last 168 hours. ------------------------------------------------------------------------------------------------------------------  Chemistries  No results for input(s): NA, K, CL, CO2, GLUCOSE, BUN, CREATININE, CALCIUM, MG, AST, ALT, ALKPHOS, BILITOT in the last 168 hours.  Invalid input(s): GFRCGP ------------------------------------------------------------------------------------------------------------------  Cardiac Enzymes No results for input(s): TROPONINI in the last 168 hours. ------------------------------------------------------------  RADIOLOGY:  No results found.     Thank  you for the consultation and for allowing [facility] to assist in the care of your patient. Our recommendations are noted above.  Please contact us if we can be of further service.  Marda Stalker, M.D., F.C.C.P.  Board Certified in Internal Medicine, Pulmonary Medicine, [place], and Sleep Medicine.  [facility] Office Number: [phone]   12/04/2017

## 2017-12-05 ENCOUNTER — Encounter: Payer: Self-pay | Admitting: Internal Medicine

## 2017-12-05 ENCOUNTER — Ambulatory Visit (INDEPENDENT_AMBULATORY_CARE_PROVIDER_SITE_OTHER): Payer: Medicare Other | Admitting: Internal Medicine

## 2017-12-05 VITALS — BP 128/82 | HR 56 | Resp 16 | Ht 60.0 in | Wt 104.0 lb

## 2017-12-05 DIAGNOSIS — J431 Panlobular emphysema: Secondary | ICD-10-CM

## 2017-12-05 DIAGNOSIS — I6523 Occlusion and stenosis of bilateral carotid arteries: Secondary | ICD-10-CM

## 2017-12-05 DIAGNOSIS — Z23 Encounter for immunization: Secondary | ICD-10-CM | POA: Diagnosis not present

## 2017-12-05 NOTE — Patient Instructions (Signed)
Continue your current inhalers.  Will give you flu vaccine today.

## 2017-12-13 DIAGNOSIS — M7918 Myalgia, other site: Secondary | ICD-10-CM | POA: Diagnosis not present

## 2017-12-13 DIAGNOSIS — M5481 Occipital neuralgia: Secondary | ICD-10-CM | POA: Diagnosis not present

## 2017-12-20 ENCOUNTER — Other Ambulatory Visit: Payer: Self-pay | Admitting: Family Medicine

## 2017-12-23 ENCOUNTER — Other Ambulatory Visit: Payer: Self-pay | Admitting: Nurse Practitioner

## 2017-12-23 DIAGNOSIS — M542 Cervicalgia: Secondary | ICD-10-CM

## 2017-12-23 DIAGNOSIS — R51 Headache: Principal | ICD-10-CM

## 2017-12-23 DIAGNOSIS — R519 Headache, unspecified: Secondary | ICD-10-CM

## 2018-01-01 ENCOUNTER — Ambulatory Visit (INDEPENDENT_AMBULATORY_CARE_PROVIDER_SITE_OTHER): Payer: Medicare Other | Admitting: Nurse Practitioner

## 2018-01-01 ENCOUNTER — Other Ambulatory Visit: Payer: Self-pay

## 2018-01-01 VITALS — BP 110/94 | HR 63 | Temp 97.6°F | Resp 16 | Ht 60.0 in | Wt 104.6 lb

## 2018-01-01 DIAGNOSIS — Z23 Encounter for immunization: Secondary | ICD-10-CM | POA: Diagnosis not present

## 2018-01-01 DIAGNOSIS — R3914 Feeling of incomplete bladder emptying: Secondary | ICD-10-CM

## 2018-01-01 DIAGNOSIS — Z13220 Encounter for screening for lipoid disorders: Secondary | ICD-10-CM | POA: Diagnosis not present

## 2018-01-01 DIAGNOSIS — E785 Hyperlipidemia, unspecified: Secondary | ICD-10-CM | POA: Diagnosis not present

## 2018-01-01 DIAGNOSIS — E119 Type 2 diabetes mellitus without complications: Secondary | ICD-10-CM | POA: Diagnosis not present

## 2018-01-01 DIAGNOSIS — I10 Essential (primary) hypertension: Secondary | ICD-10-CM | POA: Diagnosis not present

## 2018-01-01 DIAGNOSIS — N401 Enlarged prostate with lower urinary tract symptoms: Secondary | ICD-10-CM

## 2018-01-01 DIAGNOSIS — E1169 Type 2 diabetes mellitus with other specified complication: Secondary | ICD-10-CM

## 2018-01-01 LAB — POCT GLYCOSYLATED HEMOGLOBIN (HGB A1C): Hemoglobin A1C: 6.2 % — AB (ref 4.0–5.6)

## 2018-01-01 MED ORDER — TAMSULOSIN HCL 0.4 MG PO CAPS: 0.8000 mg | ORAL_CAPSULE | Freq: Every day | ORAL | 5 refills | Status: DC

## 2018-01-01 MED ORDER — METOPROLOL TARTRATE 50 MG PO TABS: 25.0000 mg | ORAL_TABLET | Freq: Two times a day (BID) | ORAL | 1 refills | Status: DC

## 2018-01-01 MED ORDER — LOSARTAN POTASSIUM 25 MG PO TABS: 25.0000 mg | ORAL_TABLET | Freq: Every day | ORAL | 1 refills | Status: DC

## 2018-01-01 NOTE — Patient Instructions (Addendum)
Jonathan Willis,   Thank you for coming in to clinic today.  1. We are sending a urology referral for your prostate.  They will work with you to further treat your prostate problems.  2. Blood pressure pill: change metoprolol to 1/2 tablet twice daily.  Please schedule a follow-up appointment with [name], AGNP. Return in about 3 months (around 04/03/2018) for diabetes.  If you have any other questions or concerns, please feel free to call the clinic or send a message through [place]. You may also schedule an earlier appointment if necessary.  You will receive a survey after today's visit either digitally by e-mail or paper by [employer]. Your experiences and feedback matter to [place].  Please respond so we know how we are doing as we provide care for you.   [name], DNP, AGNP-BC Adult Gerontology Nurse Practitioner [facility], [facility]    T?ng s?n tuy?n ti?n li?t lnh tnh Benign Prostatic Hyperplasia T?ng s?n tuy?n ti?n li?t lnh tnh (BPH) l tuy?n ti?n li?t ph ??i do qu trnh lo ha bnh th??ng ch? khng ph?i do ung th? gy ra. Tuy?n ti?n li?t l tuy?n c kch th??c b?ng qu? h? ?o, tham gia vo qu trnh s?n xu?t tinh d?ch. N n?m pha tr??c tr?c trng v bn d??i bng quang. Bng quang l?u tr? n??c ti?u v ni?u ??o l ?ng ??a n??c ti?u ra kh?i c? th?. Tuy?n ti?n li?t c th? l?n h?n khi ?n ng nhi?u tu?i h?n. Tuy?n ti?n li?t ph ??i c th? chn p vo ni?u ??o. ?i?u ny c th? lm cho kh ?i ti?u h?n. Tch t? n??c ti?u trong bng quang c th? gy nhi?m trng. p l?c ng??c dng v nhi?m trng c th? ti?n tri?n thnh t?n th??ng bng quang v suy th?n (th?n). Nguyn nhn g gy ra? Tnh tr?ng ny l m?t ph?n c?a qu trnh lo ha bnh th??ng. Tuy nhin, khng ph?i t?t c? nam gi?i ??u b? cc v?n ?? c?a tnh tr?ng ny. N?u tuy?n ti?n li?t ph ??i ra xa ni?u ??o, dng n??c ti?u s? khng b? ch?n. N?u n ph ??i h??ng v? ni?u ??o v chn p ni?u ??o, qu v? s? g?p v?n ?? v?  ti?u ti?n. ?i?u g lm t?ng nguy c?? Tnh tr?ng ny d? x?y ra h?n ? nam gi?i trn 50 tu?i. Cc d?u hi?u ho?c tri?u ch?ng l g? Nh?ng tri?u ch?ng c?a tnh tr?ng ny bao g?m:  Th?c d?y th??ng xuyn trong ?m ?? ?i ti?u.  C?n ?i ti?u th??ng xuyn trong ngy.  Kh b?t ??u dng n??c ti?u.  Gi?m kch th??c v ?? m?nh c?a dng n??c ti?u.  R? (nh? gi?t) sau khi ti?u ti?n.  Khng th? ?i ti?u. Tnh tr?ng ny c?n ph?i ?i?u tr? ngay l?p t?c.  Khng th? ?i h?t n??c ti?u trong bng quang.  ?au khi ?i ti?u. Tnh tr?ng ny ph? bi?n h?n n?u c?ng km theo nhi?m trng.  Nhi?m trng ???ng ti?u (UTI).  Ch?n ?on tnh tr?ng ny nh? th? no? Tnh tr?ng ny ???c ch?n ?on d?a vo khm th?c th?, khai thc b?nh s? v cc tri?u ch?ng c?a qu v?. Cc ki?m tra c?ng s? ???c th?c hi?n, ch?ng h?n nh?:  Ch?p bng quang sau khi ?i ti?u. Bi?n php ny ?o b?t c? l??ng n??c ti?u no cn l?i trong bng quang sau khi qu v? ?i ti?u xong.  Khm tr?c trng b?ng ngn tay. Trong l?n ki?m tra tr?c trng,  chuyn gia ch?m [place] s?c kh?e s? ki?m tra tuy?n ti?n li?t c?a qu v? b?ng cch cho m?t ngn tay ?eo g?ng, ? bi tr?n vo tr?c trng ?? s? m?t sau c?a tuy?n ti?n li?t. Ki?m tra ny pht hi?n kch th??c c?a tuy?n v b?t c? kh?i u ho?c s? pht tri?n b?t th??ng no.  Xt nghi?m n??c ti?u (phn tch n??c ti?u).  Sng l?c khng nguyn ??c hi?u tuy?n ti?n li?t (PSA). ?y l m?t xt nghi?m mu ???c s? d?ng ?? sng l?c ung th? tuy?n ti?n li?t.  Siu m. Ki?m tra ny s? d?ng sng m thanh ?? t?o hnh ?nh tuy?n ti?n li?t b?ng ph??ng php ?i?n t?.  Chuyn gia ch?m [place] s?c kh?e c th? gi?i thi?u qu v? ??n m?t chuyn gia v? b?nh th?n v b?nh tuy?n ti?n li?t (bc s? chuyn khoa ti?t ni?u). Tnh tr?ng ny ???c ?i?u tr? nh? th? no? Khi b?t ??u c tri?u ch?ng, chuyn gia ch?m [place] s?c kh?e s? theo di tnh tr?ng c?a qu v? (theo di tch c?c ho?c ch? tch c?c). Vi?c ?i?u tr? ti?nh tra?ng ny ty thu?c vo m??c ?? n?ng c?a b?nh. ?i?u  tr? c th? bao g?m:  Theo di v khm th??ng nin. ?y c th? l cch ?i?u tr? duy nh?t c?n thi?t n?u tnh tr?ng v cc tri?u ch?ng c?a qu v? nh?.  Thu?c ?? gia?m nhe? ca?c tri?u ch??ng, bao g?m: ? Thu?c lm thu nh? tuy?n ti?n li?t. ? Thu?c ?? th? gin cc c? c?a tuy?n ti?n li?t.  Ph?u thu?t trong tr??ng h?p n?ng. Ph?u thu?t c th? bao g?m: ? C?t b? tuy?n ti?n li?t. Trong th? thu?t ny, m tuy?n ti?n li?t ???c ???c lo?i b? hon ton qua m?t v?t m? m? ho?c qua n?i soi ? b?ng ho?c robot. ? C?t tuy?n ti?n li?t qua ni?u ??o (TURP). Trong th? thu?t ny, m?t d?ng c? ???c lu?n qua l? trn ??u d??ng v?t (ni?u ??o). N ???c s? d?ng ?? c?t b? m li bn trong c?a tuy?n ti?n li?t. Cc m?nh ny ???c lo?i b? thng qua cng m?t l? ? ??u d??ng v?t. Ph??ng php ny gip lo?i b? t?c ngh?n. ? R?ch qua ni?u ??o (TUIP). Trong th? thu?t ny, cc v?t r?ch nh? ???c t?o ra trong tuy?n ti?n li?t. Th? thu?t ny lm gi?m p l?c c?a tuy?n ti?n li?t ln ni?u ??o. ? Li?u php nhi?t vi sng ni?u ??o (TUMT). Th? thu?t ny s? d?ng vi sng ?? t?o ra nhi?t. Nhi?t s? ph h?y v lo?i b? m?t l??ng nh? m tuy?n ti?n li?t. ? B?c h?i tuy?n ti?n li?t b?ng kim qua ni?u ??o (TUNA). Th? thu?t ny s? d?ng t?n s? radio ?? ph h?y v lo?i b? m?t l??ng nh? m tuy?n ti?n li?t. ? S? ?ng t? b?ng laser qua k? ([place]). Th? thu?t ny s? d?ng lade ?? ph h?y v lo?i b? m?t l??ng nh? m tuy?n ti?n li?t. ? Lm b?c h?i tuy?n ti?n li?t qua ni?u ??o (TUVP). Th? thu?t ny s? d?ng cc ?i?n c?c ?? ph h?y v lo?i b? m?t l??ng nh? m tuy?n ti?n li?t. ? Nng ni?u ??o tuy?n ti?n li?t. Th? thu?t ny ??a m?t m c?y vo ?? ??y cc thy c?a tuy?n ti?n li?t ra xa ni?u ??o.  Tun th? nh?ng h??ng d?n ny ? nh:  Ch? s? d?ng thu?c khng k ??n v thu?c k ??n theo ch? d?n c?a chuyn gia ch?m [place] s?c kh?e.  Theo di cc tri?u  ch?ng xem c b?t c? thay ??i no khng. Hy trao ??i v?i chuyn gia ch?m [place] s?c kh?e n?u c b?t k? thay ??i no.  Trnh u?ng nhi?u n??c  tr??c khi ?i ng? ho?c ra ngoi n?i cng c?ng.  Trnh u?ng ho?c gi?m l??ng caffeine ho?c r??u qu v? u?ng.  T? cho qu v? th?i gian khi ti?u ti?n.  Tun th? t?t c? cc l?n khm theo di theo ch? d?n c?a chuyn gia ch?m [place] s?c kh?e. ?i?u ny c vai tr quan tr?ng. Hy lin l?c v?i chuyn gia ch?m [place] s?c kh?e n?u:  Qu v? b? ?au l?ng khng r nguyn nhn.  Cc tri?u ch?ng c?a qu v? khng ?? h?n sau khi ???c ?i?u tr?Sander Nephew v? b? nh?ng tc d?ng ph? c?a thu?c m qu v? ?ang dng.  N??c ti?u c?a qu v? tr? nn r?t s?m mu ho?c c mi hi.  Vng b?ng d??i c?a qu v? ch??ng ln v qu v? ?i ti?u kh kh?n. Yu c?u tr? gip ngay l?p t?c n?u:  Qu v? b? s?t ho?c ?n l?nh.  Qu v? ??t nhin khng th? ti?u ti?n.  Qu v? c?m th?y chong vng, ho?c r?t chng m?t, ho?c ng?t x?u.  C m?t l??ng l?n mu ho?c c?c mu ?ng trong n??c ti?u.  V?n ?? ti?u ti?n tr? ln kh qu?n l.  Qu v? b? ?au th?t l?ng ho?c ?au m?ng s??n t? m?c ?? trung bnh ??n m?c ?? n?ng. M?ng s??n l phn bn c? th? gi?a x??ng s??n v hng. Nh?ng tri?u ch?ng ny c th? l bi?u hi?n c?a m?t v?n ?? nghim tr?ng c?n c?p c?u. Khng ch? xem tri?u ch?ng c h?t khng. Hy ?i khm ngay l?p t?c. G?i cho d?ch v? c?p c?u t?i ??a ph??ng (911 ? [place] Khng t? li xe ??n b?nh vi?n. Tm t?t  T?ng s?n tuy?n ti?n li?t lnh tnh (BPH) l tuy?n ti?n li?t ph ??i do qu trnh lo ha bnh th??ng ch? khng ph?i do ung th? gy ra.  Tuy?n ti?n li?t ph ??i c th? chn p vo ni?u ??o. ?i?u ny c th? lm cho kh ?i ti?u.  Tnh tr?ng ny l m?t ph?n c?a qu trnh lo ha bnh th??ng v d? x?y ra h?n ? nam gi?i trn 50 tu?i.  Yu c?u tr? gip ngay l?p t?c n?u qu v? ??t nhin khng th? ti?u ti?n. Thng tin ny khng nh?m m?c ?ch thay th? cho l?i khuyn m chuyn gia ch?m [place] s?c kh?e ni v?i qu v?. Hy b?o ??m qu v? ph?i th?o lu?n b?t k? v?n ?? g m qu v? c v?i chuyn gia ch?m  s?c kh?e c?a qu v?. Document Released: 03/14/2005 Document  Revised: 07/20/2016 Document Reviewed: 07/20/2016 Elsevier Interactive Patient Education  2018 [employer].

## 2018-01-01 NOTE — Progress Notes (Signed)
poct a1c

## 2018-01-01 NOTE — Progress Notes (Signed)
Subjective:    Patient ID: Jonathan Willis, male    DOB: [birth year], 72 y.o.   MRN: [id number]  Jonathan Willis is a 72 y.o. male presenting on 01/01/2018 for Diabetes (metformin discontinued at last visit )  [place] telephone interpreter used for duration of visit.  Name: Vaughan Basta  ID: [id number]  HPI Diabetes Pt presents today for follow up of Type 2 diabetes mellitus.  He is checking fasting am CBG at home with a range of 92-93, occasionally 125.  Always > 70.   - Current diabetic medications include: none - He is not currently symptomatic.  - He denies polydipsia, polyphagia, polyuria, headaches, diaphoresis, shakiness, chills, pain, numbness or tingling in extremities and changes in vision.   - Clinical course has been stable. - He  reports an exercise routine that includes walking, 6-7 days per week for 30 or more minutes. - His diet is moderate in salt, moderate in fat, and moderate in carbohydrates.  Veg, lean meats, fish, avoids fried foods, limits sodium intake. - Weight trend: stable, healthy  PREVENTION: Eye exam current (within one year): yes Foot exam current (within one year): yes  Lipid/ASCVD risk reduction - on statin: atorvastatin Kidney protection - on ace or arb: losartan Recent Labs    01/10/17 1201 06/29/17 1556 09/25/17 0853 01/01/18 0845  HGBA1C 6.5* 6.4 5.9* 6.2*    Hypertension - He is not checking BP at home or outside of clinic.    - Current medications: metoprolol tartrate 50 mg bid, losartan 25 mg once daily (ordered, but not taking), HCTZ 12.5 mg once daily, tolerating well without side effects - He is not currently symptomatic. - Pt denies headache, lightheadedness, dizziness, changes in vision, chest tightness/pressure, palpitations, leg swelling, sudden loss of speech or loss of consciousness.  BPH He reports continuing to have incomplete bladder emptying, urinary frequency.  He is currently taking tamsulosin 0.8 mg once daily without any significant  improvement in symptoms.  He asks today about increasing medication or taking other options to treat his symptoms.  Again, culturally history taking for BPH has been difficult as patient is very guarded about discussing this problem.  Complicated also by use of interpreter with language line to discuss this sensitive topic.  Social History   Tobacco Use  . Smoking status: Former Smoker    Last attempt to quit: 01/11/2007    Years since quitting: 10.9  . Smokeless tobacco: Never Used  Substance Use Topics  . Alcohol use: No  . Drug use: No    Review of Systems Per HPI unless specifically indicated above    Objective:    BP (!) 110/94 (BP Location: Right Arm)   Pulse 63   Temp 97.6 F (36.4 C) (Oral)   Resp 16   Ht 5' (1.524 m)   Wt 104 lb 9.6 oz (47.4 kg)   SpO2 100%   BMI 20.43 kg/m   Wt Readings from Last 3 Encounters:  01/01/18 104 lb 9.6 oz (47.4 kg)  12/05/17 104 lb (47.2 kg)  09/25/17 106 lb 12.8 oz (48.4 kg)    Physical Exam  Constitutional: He is oriented to person, place, and time. He appears well-developed and well-nourished. No distress.  HENT:  Head: Normocephalic and atraumatic.  Cardiovascular: Normal rate, regular rhythm, S1 normal, S2 normal, normal heart sounds and intact distal pulses.  Pulmonary/Chest: Effort normal and breath sounds normal. No respiratory distress. He has no wheezes. He has no rales.  Neurological: He is alert and oriented  to person, place, and time.  Skin: Skin is warm and dry. Capillary refill takes less than 2 seconds.  Psychiatric: He has a normal mood and affect. His behavior is normal. Judgment and thought content normal.  Vitals reviewed.   Results for orders placed or performed in visit on 01/01/18  Lipid panel  Result Value Ref Range   Cholesterol 172 <200 mg/dL   HDL 59 >40 mg/dL   Triglycerides 105 <150 mg/dL   LDL Cholesterol (Calc) 93 mg/dL (calc)   Total CHOL/HDL Ratio 2.9 <5.0 (calc)   Non-HDL Cholesterol (Calc)  113 <130 mg/dL (calc)  COMPLETE METABOLIC PANEL WITH GFR  Result Value Ref Range   Glucose, Bld 110 (H) 65 - 99 mg/dL   BUN 17 7 - 25 mg/dL   Creat 0.90 0.70 - 1.18 mg/dL   GFR, Est Non African American 84 > OR = 60 mL/min/1.49m2   GFR, Est African American 97 > OR = 60 mL/min/1.21m2   BUN/Creatinine Ratio NOT APPLICABLE 6 - 22 (calc)   Sodium 136 135 - 146 mmol/L   Potassium 5.0 3.5 - 5.3 mmol/L   Chloride 98 98 - 110 mmol/L   CO2 33 (H) 20 - 32 mmol/L   Calcium 10.2 8.6 - 10.3 mg/dL   Total Protein 8.0 6.1 - 8.1 g/dL   Albumin 4.7 3.6 - 5.1 g/dL   Globulin 3.3 1.9 - 3.7 g/dL (calc)   AG Ratio 1.4 1.0 - 2.5 (calc)   Total Bilirubin 0.6 0.2 - 1.2 mg/dL   Alkaline phosphatase (APISO) 61 40 - 115 U/L   AST 25 10 - 35 U/L   ALT 37 9 - 46 U/L  PSA  Result Value Ref Range   PSA 1.1 < OR = 4.0 ng/mL  POCT glycosylated hemoglobin (Hb A1C)  Result Value Ref Range   Hemoglobin A1C 6.2 (A) 4.0 - 5.6 %   HbA1c POC (<> result, manual entry)     HbA1c, POC (prediabetic range)     HbA1c, POC (controlled diabetic range)        Assessment & Plan:   Problem List Items Addressed This Visit      Cardiovascular and Mediastinum   Essential hypertension    Controlled and at goal of < 130/80.  Pt is currently on hydrochlorothiazide, losartan, and metoprolol, which he is tolerating well without side effects  Plan: 1. Continue hydrochlorothiazide 12.5 mg once daily.  - Continue losartan 50 mg once daily. - REDUCE metoprolol to 25 mg bid 2. Labs today. 3. Follow-up 3 months.      Relevant Medications   losartan (COZAAR) 25 MG tablet   metoprolol tartrate (LOPRESSOR) 50 MG tablet     Endocrine   Type 2 diabetes mellitus with other specified complication ([place]) - Primary    Stable and controlled.  Currently at goal of A1c < 7%.  Pt has diabetes and is currently managing with diet and exercise.  Good lifestyle management with change from white rice to brown rice and adding more  vegetables.  Plan: 1. Continue off medications - reinforced need for ongoing lifestyle modifications. 2. START checking CBG fasting in am.  Goal 80-120.  If regularly over 120, will resume metformin.  Patient to call clinic. 3. Encourage increased physical activity, reinforced low glycemic diet. 4. Continue ARB, statin.  5. Follow up 3 months.      Relevant Medications   metFORMIN (GLUCOPHAGE) 500 MG tablet   losartan (COZAAR) 25 MG tablet   Other Relevant  Orders   POCT glycosylated hemoglobin (Hb A1C) (Completed)   Lipid panel (Completed)   COMPLETE METABOLIC PANEL WITH GFR (Completed)   Dyslipidemia associated with type 2 diabetes mellitus ([place])    Pt w/ DM and HTN.  Prior lipid panel with LDL > goal. Needs recheck after starting atorvastatin.  Plan: 1. Continue atorvastatin 20 mg w/ DM CV equivalent. LDL goal < 70 - reassess after labs. 2. Check fasting lipid panel. 3. Follow up as needed.      Relevant Medications   metFORMIN (GLUCOPHAGE) 500 MG tablet   losartan (COZAAR) 25 MG tablet     Genitourinary   Benign prostatic hyperplasia with incomplete bladder emptying    Pt with continued positive LUTS symptoms, and no additional improvement  Plan: 1. PSA WNL at last check.   2. Continue flomax 0.8 mg once daily.  3. Urology referral placed. 4. Followup 3 months prn.      Relevant Medications   tamsulosin (FLOMAX) 0.4 MG CAPS capsule   Other Relevant Orders   Ambulatory referral to Urology   PSA (Completed)    Other Visit Diagnoses    Needs flu shot          Meds ordered this encounter  Medications  . losartan (COZAAR) 25 MG tablet    Sig: Take 1 tablet (25 mg total) by mouth daily.    Dispense:  90 tablet    Refill:  1    Order Specific Question:   Supervising Provider    Answer:   Olin Hauser [[id number]]  . metoprolol tartrate (LOPRESSOR) 50 MG tablet    Sig: Take 0.5 tablets (25 mg total) by mouth 2 (two) times daily.    Dispense:  180 tablet     Refill:  1    Order Specific Question:   Supervising Provider    Answer:   Olin Hauser [[id number]]  . tamsulosin (FLOMAX) 0.4 MG CAPS capsule    Sig: Take 2 capsules (0.8 mg total) by mouth daily.    Dispense:  60 capsule    Refill:  5    Order Specific Question:   Supervising Provider    Answer:   Olin Hauser [[id number]]    Follow up plan: Return in about 3 months (around 04/03/2018) for diabetes.  [name], DNP, AGPCNP-BC Adult Gerontology Primary Care Nurse Practitioner [facility] 01/01/2018, 8:57 AM

## 2018-01-02 LAB — COMPLETE METABOLIC PANEL WITH GFR
AG Ratio: 1.4 (calc) (ref 1.0–2.5)
ALT: 37 U/L (ref 9–46)
AST: 25 U/L (ref 10–35)
Albumin: 4.7 g/dL (ref 3.6–5.1)
Alkaline phosphatase (APISO): 61 U/L (ref 40–115)
BUN: 17 mg/dL (ref 7–25)
CO2: 33 mmol/L — ABNORMAL HIGH (ref 20–32)
Calcium: 10.2 mg/dL (ref 8.6–10.3)
Chloride: 98 mmol/L (ref 98–110)
Creat: 0.9 mg/dL (ref 0.70–1.18)
GFR, Est African American: 97 mL/min/{1.73_m2} (ref >=60)
GFR, Est Non African American: 84 mL/min/{1.73_m2} (ref >=60)
Globulin: 3.3 g/dL (calc) (ref 1.9–3.7)
Glucose, Bld: 110 mg/dL — ABNORMAL HIGH (ref 65–99)
Potassium: 5 mmol/L (ref 3.5–5.3)
Sodium: 136 mmol/L (ref 135–146)
Total Bilirubin: 0.6 mg/dL (ref 0.2–1.2)
Total Protein: 8 g/dL (ref 6.1–8.1)

## 2018-01-02 LAB — LIPID PANEL
Cholesterol: 172 mg/dL (ref <200)
HDL: 59 mg/dL (ref >40)
LDL Cholesterol (Calc): 93 mg/dL (calc)
Non-HDL Cholesterol (Calc): 113 mg/dL (calc) (ref <130)
Total CHOL/HDL Ratio: 2.9 (calc) (ref <5.0)
Triglycerides: 105 mg/dL (ref <150)

## 2018-01-02 LAB — PSA: PSA: 1.1 ng/mL (ref <=4.0)

## 2018-01-18 ENCOUNTER — Other Ambulatory Visit: Payer: Self-pay | Admitting: Nurse Practitioner

## 2018-01-18 DIAGNOSIS — R51 Headache: Principal | ICD-10-CM

## 2018-01-18 DIAGNOSIS — R519 Headache, unspecified: Secondary | ICD-10-CM

## 2018-01-18 DIAGNOSIS — M542 Cervicalgia: Secondary | ICD-10-CM

## 2018-02-09 ENCOUNTER — Ambulatory Visit: Payer: Medicare Other | Admitting: Urology

## 2018-02-19 ENCOUNTER — Encounter: Payer: Self-pay | Admitting: Nurse Practitioner

## 2018-02-19 NOTE — Assessment & Plan Note (Signed)
Stable and controlled.  Currently at goal of A1c < 7%.  Pt has diabetes and is currently managing with diet and exercise.  Good lifestyle management with change from white rice to brown rice and adding more vegetables.  Plan: 1. Continue off medications - reinforced need for ongoing lifestyle modifications. 2. START checking CBG fasting in am.  Goal 80-120.  If regularly over 120, will resume metformin.  Patient to call clinic. 3. Encourage increased physical activity, reinforced low glycemic diet. 4. Continue ARB, statin.  5. Follow up 3 months.

## 2018-02-19 NOTE — Assessment & Plan Note (Addendum)
Pt w/ DM and HTN.  Prior lipid panel with LDL > goal. Needs recheck after starting atorvastatin.  Plan: 1. Continue atorvastatin 20 mg w/ DM CV equivalent. LDL goal < 70 - reassess after labs. 2. Check fasting lipid panel. 3. Follow up as needed.

## 2018-02-19 NOTE — Assessment & Plan Note (Addendum)
Controlled and at goal of < 130/80.  Pt is currently on hydrochlorothiazide, losartan, and metoprolol, which he is tolerating well without side effects  Plan: 1. Continue hydrochlorothiazide 12.5 mg once daily.  - Continue losartan 50 mg once daily. - REDUCE metoprolol to 25 mg bid 2. Labs today. 3. Follow-up 3 months.

## 2018-02-19 NOTE — Assessment & Plan Note (Signed)
Pt with continued positive LUTS symptoms, and no additional improvement  Plan: 1. PSA WNL at last check.   2. Continue flomax 0.8 mg once daily.  3. Urology referral placed. 4. Followup 3 months prn.

## 2018-02-21 NOTE — Progress Notes (Signed)
02/26/2018 8:54 AM  Jonathan Willis [birth year] [id number]  Referring provider: Mikey College, NP [address], [place] [address]  Chief Complaint  Patient presents with  . Benign Prostatic Hypertrophy    HPI: Jonathan Willis is a 72 y.o. male with a history of BPH with LUTS. He was referred to [place] by Mikey College,  NP and presents today, with his interpreter Maudie Mercury, for his initial consult.   He reports associated symptoms of incomplete bladder emptying, urinary frequency, nocturia (up to 5x nightly). He denies leakage, hx of UTI, dysuria, gross hematuria, flank/abdominal/pelvis/scrotal pain, and associated symptoms. He is currently taking tamsulosin 0.8 mg daily, and reports being on this medication for a few months. He believes the medication has had a minimal effect on him so far.   His PSA trend is as follows: 0.81 (02/01/2014), 1.6 (06/29/2017), and 1.1(01/01/2018). PSA drawn today. His PVR today was 14 mL. His IPSS is 15 today (see below). Microscopic/Dipstick Negative.  IPSS    Row Name 02/26/18 0800         International Prostate Symptom Score   How often have you had the sensation of not emptying your bladder?  Almost always     How often have you had to urinate less than every two hours?  About half the time     How often have you found you stopped and started again several times when you urinated?  Less than 1 in 5 times     How often have you found it difficult to postpone urination?  Not at All     How often have you had a weak urinary stream?  Less than 1 in 5 times     How often have you had to strain to start urination?  Not at All     How many times did you typically get up at night to urinate?  5 Times     Total IPSS Score  15       Quality of Life due to urinary symptoms   If you were to spend the rest of your life with your urinary condition just the way it is now how would you feel about that?  Mostly Disatisfied        Score:  1-7 Mild 8-19  Moderate 20-35 Severe    PMH: Past Medical History:  Diagnosis Date  . Chronic cough 01/16/2017  . Diabetes mellitus without complication ([place])   . External hemorrhoid   . 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 History of TB (tuberculosis)   . Hypertension   . Lower GI bleed    Surgical History: Past Surgical History:  Procedure Laterality Date  . COLONOSCOPY WITH PROPOFOL N/A 04/10/2017   Procedure: COLONOSCOPY WITH PROPOFOL;  Surgeon: [name], MD;  Location[facility] ENDOSCOPY;  Service: Gastroenterology;  Laterality: N/A;  . NO PAST SURGERIES     Home Medications:  Allergies as of 02/26/2018   No Known Allergies     Medication List        Accurate as of 02/26/18  8:54 AM. Always use your most recent med list.          ACCU-CHEK AVIVA PLUS test strip Generic drug:  glucose blood TEST ONCE DAILY   ACCU-CHEK SOFTCLIX LANCETS lancets TEST ONCE DAILY   albuterol 108 (90 Base) MCG/ACT inhaler Commonly known as:  PROVENTIL HFA;VENTOLIN HFA Inhale 2 puffs into the lungs every 6 (  six) hours as needed for wheezing or shortness of breath.   aspirin 81 MG chewable tablet Chew by mouth daily.   atorvastatin 20 MG tablet Commonly known as:  LIPITOR TAKE 1 TABLET BY MOUTH EVERY DAY   baclofen 10 MG tablet Commonly known as:  LIORESAL TAKE 1 TABLET BY MOUTH THREE TIMES A DAY   blood glucose meter kit and supplies Dispense based on patient and insurance preference. Use one time daily as directed. (FOR ICD-10 E10.9, E11.9).   hydrochlorothiazide 12.5 MG tablet Commonly known as:  HYDRODIURIL Take 1 tablet (12.5 mg total) by mouth daily.   INCRUSE ELLIPTA 62.5 MCG/INH Aepb Generic drug:  umeclidinium bromide INHALE 1 PUFF DAILY INTO THE LUNGS.   losartan 50 MG tablet Commonly known as:  COZAAR Take 50 mg by mouth daily.   meclizine 12.5 MG tablet Commonly known as:  ANTIVERT Take 1 tablet (12.5 mg total) by  mouth 3 (three) times daily as needed for dizziness or nausea.   metFORMIN 500 MG tablet Commonly known as:  GLUCOPHAGE TAKE 1 TABLET BY MOUTH EVERY DAY WITH BREAKFAST   metoprolol tartrate 50 MG tablet Commonly known as:  LOPRESSOR Take 0.5 tablets (25 mg total) by mouth 2 (two) times daily.   solifenacin 5 MG tablet Commonly known as:  VESICARE Take 1 tablet (5 mg total) by mouth daily.   Spacer/Aero-Hold Chamber Bags Misc 1 Device by Does not apply route every 6 (six) hours as needed (with inhaler).   SYMBICORT 160-4.5 MCG/ACT inhaler Generic drug:  budesonide-formoterol TAKE 2 PUFFS BY MOUTH TWICE A DAY   tamsulosin 0.4 MG Caps capsule Commonly known as:  FLOMAX Take 2 capsules (0.8 mg total) by mouth daily.      Allergies: No Known Allergies  Family History: Family History  Problem Relation Age of Onset  . COPD Neg Hx   . Diabetes Mellitus II Neg Hx   . Hypertension Neg Hx    Social History:  reports that he quit smoking about 11 years ago. He has never used smokeless tobacco. He reports that he does not drink alcohol or use drugs.  ROS: UROLOGY Frequent Urination?: Yes Hard to postpone urination?: Yes Burning/pain with urination?: Yes Get up at night to urinate?: Yes Leakage of urine?: Yes Urine stream starts and stops?: Yes Trouble starting stream?: Yes Do you have to strain to urinate?: No Blood in urine?: No Urinary tract infection?: No Sexually transmitted disease?: No Injury to kidneys or bladder?: No Painful intercourse?: No Weak stream?: Yes Erection problems?: Yes Penile pain?: No  Gastrointestinal Nausea?: No Vomiting?: No Indigestion/heartburn?: No Diarrhea?: No Constipation?: No  Constitutional Fever: No Night sweats?: No Weight loss?: No Fatigue?: No  Skin Skin rash/lesions?: No Itching?: No  Eyes Blurred vision?: No Double vision?: No  Ears/Nose/Throat Sore throat?: Yes Sinus problems?:  No  Hematologic/Lymphatic Swollen glands?: No Easy bruising?: No  Cardiovascular Leg swelling?: No Chest pain?: No  Respiratory Cough?: Yes Shortness of breath?: No  Endocrine Excessive thirst?: No  Musculoskeletal Back pain?: Yes Joint pain?: No  Neurological Headaches?: Yes Dizziness?: Yes  Psychologic Depression?: No Anxiety?: No  Physical Exam: BP (!) 168/76 (BP Location: Left Arm, Patient Position: Sitting, Cuff Size: Normal)   Pulse 73   Ht 5' (1.524 m)   Wt 108 lb 9.6 oz (49.3 kg)   BMI 21.21 kg/m   Constitutional:  Alert and oriented, No acute distress. HEENT: [place] AT, moist mucus membranes.  Trachea midline, no masses. Cardiovascular: No clubbing,  cyanosis, or edema. Respiratory: Normal respiratory effort, no increased work of breathing. GI: Abdomen is soft, nontender, nondistended, no abdominal masses GU: No CVA tenderness. Rectal: Prostate is flat, about 25 grams. Smooth. No nodules. Lymph: No cervical or inguinal lymphadenopathy. Skin: No rashes, bruises or suspicious lesions. Neurologic: Grossly intact, no focal deficits, moving all 4 extremities. Psychiatric: Normal mood and affect.  Laboratory Data: Lab Results  Component Value Date   CREATININE 0.90 01/01/2018   Lab Results  Component Value Date   PSA 1.1 01/01/2018   PSA 1.6 06/29/2017   PSA 0.8 02/01/2014   Lab Results  Component Value Date   HGBA1C 6.2 (A) 01/01/2018   Assessment & Plan:    1. BPH with LUTS -His symptoms are storage related. Small prostate on exam. Symptoms may be more related to overactive bladder.  2. Overactive Bladder - PVR today was 29m. Microscopic/Dipstick Negative - Discontinue tamsulosin. - Patient given trial of Vesicare '5mg'$  daily. Call if any worsening of symptoms. - RTC 4-6 weeks for symptom reassessment and bladder scan for PVR  Return in about 4 weeks (around 03/26/2018) for symptom reassessment and bladder scan for PVR.  SAbbie Sons  MD  [id number] [place]cl[place] [phone] I, TStephania Fragmin, am acting as a sEducation administratorfor S[employer] MD  I have reviewed the note for accuracy and completeness and agree. SJohn Giovanni MD

## 2018-02-26 ENCOUNTER — Ambulatory Visit (INDEPENDENT_AMBULATORY_CARE_PROVIDER_SITE_OTHER): Payer: Medicare Other | Admitting: Urology

## 2018-02-26 ENCOUNTER — Other Ambulatory Visit: Payer: Self-pay | Admitting: Nurse Practitioner

## 2018-02-26 ENCOUNTER — Other Ambulatory Visit: Payer: Self-pay | Admitting: Urology

## 2018-02-26 ENCOUNTER — Encounter: Payer: Self-pay | Admitting: Urology

## 2018-02-26 VITALS — BP 168/76 | HR 73 | Ht 60.0 in | Wt 108.6 lb

## 2018-02-26 DIAGNOSIS — R350 Frequency of micturition: Secondary | ICD-10-CM | POA: Diagnosis not present

## 2018-02-26 DIAGNOSIS — R351 Nocturia: Secondary | ICD-10-CM

## 2018-02-26 DIAGNOSIS — R3914 Feeling of incomplete bladder emptying: Secondary | ICD-10-CM

## 2018-02-26 DIAGNOSIS — N401 Enlarged prostate with lower urinary tract symptoms: Secondary | ICD-10-CM

## 2018-02-26 DIAGNOSIS — Z13220 Encounter for screening for lipoid disorders: Secondary | ICD-10-CM

## 2018-02-26 DIAGNOSIS — J431 Panlobular emphysema: Secondary | ICD-10-CM

## 2018-02-26 LAB — URINALYSIS, COMPLETE
Bilirubin, UA: NEGATIVE
Glucose, UA: NEGATIVE
Ketones, UA: NEGATIVE
Leukocytes, UA: NEGATIVE
NITRITE UA: NEGATIVE
PH UA: 5 (ref 5.0–7.5)
Protein, UA: NEGATIVE
RBC UA: NEGATIVE
Specific Gravity, UA: 1.025 (ref 1.005–1.030)
Urobilinogen, Ur: 0.2 mg/dL (ref 0.2–1.0)

## 2018-02-26 LAB — MICROSCOPIC EXAMINATION
Epithelial Cells (non renal): NONE SEEN /hpf (ref 0–10)
WBC, UA: NONE SEEN /hpf (ref 0–5)

## 2018-02-26 LAB — BLADDER SCAN AMB NON-IMAGING

## 2018-02-26 MED ORDER — SOLIFENACIN SUCCINATE 5 MG PO TABS: 5.0000 mg | ORAL_TABLET | Freq: Every day | ORAL | 2 refills | Status: DC

## 2018-03-01 NOTE — Telephone Encounter (Signed)
Patient notified to call Insurance company to find out medication is allowed

## 2018-03-01 NOTE — Telephone Encounter (Signed)
Need to know the covered OAB medications on his plan.

## 2018-03-06 ENCOUNTER — Other Ambulatory Visit: Payer: Self-pay | Admitting: Nurse Practitioner

## 2018-03-06 DIAGNOSIS — E119 Type 2 diabetes mellitus without complications: Secondary | ICD-10-CM

## 2018-03-14 ENCOUNTER — Other Ambulatory Visit: Payer: Self-pay | Admitting: Nurse Practitioner

## 2018-03-14 DIAGNOSIS — I10 Essential (primary) hypertension: Secondary | ICD-10-CM

## 2018-03-14 DIAGNOSIS — J431 Panlobular emphysema: Secondary | ICD-10-CM

## 2018-04-03 ENCOUNTER — Encounter: Payer: Self-pay | Admitting: Nurse Practitioner

## 2018-04-03 ENCOUNTER — Ambulatory Visit (INDEPENDENT_AMBULATORY_CARE_PROVIDER_SITE_OTHER): Payer: Medicare Other | Admitting: Nurse Practitioner

## 2018-04-03 ENCOUNTER — Other Ambulatory Visit: Payer: Self-pay

## 2018-04-03 VITALS — BP 121/46 | HR 69 | Temp 97.5°F | Resp 17 | Ht 60.0 in | Wt 107.8 lb

## 2018-04-03 DIAGNOSIS — Z23 Encounter for immunization: Secondary | ICD-10-CM

## 2018-04-03 DIAGNOSIS — E1169 Type 2 diabetes mellitus with other specified complication: Secondary | ICD-10-CM

## 2018-04-03 DIAGNOSIS — J431 Panlobular emphysema: Secondary | ICD-10-CM | POA: Diagnosis not present

## 2018-04-03 DIAGNOSIS — I10 Essential (primary) hypertension: Secondary | ICD-10-CM

## 2018-04-03 LAB — POCT GLYCOSYLATED HEMOGLOBIN (HGB A1C): Hemoglobin A1C: 6.2 % — AB (ref 4.0–5.6)

## 2018-04-03 MED ORDER — UMECLIDINIUM-VILANTEROL 62.5-25 MCG/INH IN AEPB: 1.0000 | INHALATION_SPRAY | Freq: Every day | RESPIRATORY_TRACT | 1 refills | Status: DC

## 2018-04-03 NOTE — Assessment & Plan Note (Signed)
Controlled and at goal of < 130/80.  Pt is currently on hydrochlorothiazide, losartan, and metoprolol, which he is tolerating well without side effects  Plan: 1. Continue hydrochlorothiazide 12.5 mg once daily.  - Continue losartan 50 mg once daily. - Continue metoprolol without changes 2. Labs today. 3. Follow-up 6 months.

## 2018-04-03 NOTE — Assessment & Plan Note (Signed)
Controlled on current medication regimen with full control of symptoms.  Patient continues to perform normal daily activities and regular exercise    Plan: 1. Encouraged regular physical activity. 2. STOP Incruse and Symbicort.   3. START Anoro one puff once daily.  If not covered by insurance, recommend continuing only LAMA, LABA separately with Incruse and LABA. 4. Continue albuterol prn.  Reinforced use during periods with worsening pulmonary function and only prn for rescue. 5. Followup in 6 months

## 2018-04-03 NOTE — Progress Notes (Signed)
Subjective:    Patient ID: Jonathan Willis, male    DOB: 1945-10-05, 73 y.o.   MRN: [id number]  Jonathan Willis is a 73 y.o. male presenting on 04/03/2018 for Diabetes  Patient is accompanied again today by his daughter-in-law: Yolonda Kida who has been named on interpreter waiver.  She is interpreting for visit today.  HPI Diabetes Pt presents today for follow up of Type 2 diabetes mellitus. He is checking fasting am CBG at home with a range of 90-130, usually 95-110 - Current diabetic medications include: none.  This changed after last visit to stop taking metformin. - He is not currently symptomatic.  - He denies polydipsia, polyphagia, polyuria, headaches, diaphoresis, shakiness, chills, pain, numbness or tingling in extremities and changes in vision.   - Clinical course has been stable. - He  reports an exercise routine that includes walking, 6-7 days per week. - His diet is low in salt, low in fat, and low in carbohydrates. - Weight trend: stable  PREVENTION: Eye exam current (within one year): yes Foot exam current (within one year): yes  Lipid/ASCVD risk reduction - on statin: yes Kidney protection - on ace or arb: yes Recent Labs    06/29/17 1556 09/25/17 0853 01/01/18 0845 04/03/18 0810  HGBA1C 6.4 5.9* 6.2* 6.2*    Hypertension - He is not checking BP at home or outside of clinic.    - Current medications: hydrochlorothiazide 12.5 mg once daily, metoprolol tartrate 50 mg one tablet twice daily, losartan 50 mg once daily, tolerating well without side effects - He is not currently symptomatic.  Very rare dizziness at this time.   - Pt denies headache, lightheadedness, dizziness, changes in vision, chest tightness/pressure, palpitations, leg swelling, sudden loss of speech or loss of consciousness.   COPD Breathing is "normal."  Uses red rescue albuterol daily right now, but states he does not know it is rescue only.   He continues on Incruse and Symbicort.  Has no problems with  shortness of breath on exertion or at rest.    Urinary urgency Patient notes no change with addition of Vesicare from Urology.  Has appointment with Dr. Bernardo Heater tomorrow.  Encouraged patient to continue discussing symptoms and medications.  Social History   Tobacco Use  . Smoking status: Former Smoker    Last attempt to quit: 01/11/2007    Years since quitting: 11.2  . Smokeless tobacco: Never Used  Substance Use Topics  . Alcohol use: No  . Drug use: No    Review of Systems Per HPI unless specifically indicated above     Objective:    BP (!) 121/46 (BP Location: Right Arm, Patient Position: Sitting, Cuff Size: Normal)   Pulse 69   Temp (!) 97.5 F (36.4 C) (Oral)   Resp 17   Ht 5' (1.524 m)   Wt 107 lb 12.8 oz (48.9 kg)   SpO2 100%   BMI 21.05 kg/m   Wt Readings from Last 3 Encounters:  04/03/18 107 lb 12.8 oz (48.9 kg)  02/26/18 108 lb 9.6 oz (49.3 kg)  01/01/18 104 lb 9.6 oz (47.4 kg)    Physical Exam Vitals signs reviewed.  Constitutional:      General: He is awake. He is not in acute distress.    Appearance: He is well-developed.  HENT:     Head: Normocephalic and atraumatic.     Mouth/Throat:     Mouth: Mucous membranes are moist.     Pharynx: Oropharynx is clear.  Eyes:     Conjunctiva/sclera: Conjunctivae normal.     Pupils: Pupils are equal, round, and reactive to light.  Neck:     Musculoskeletal: Normal range of motion and neck supple.     Vascular: No carotid bruit.  Cardiovascular:     Rate and Rhythm: Normal rate and regular rhythm.     Pulses:          Radial pulses are 2+ on the right side and 2+ on the left side.       Posterior tibial pulses are 1+ on the right side and 1+ on the left side.     Heart sounds: Normal heart sounds, S1 normal and S2 normal.  Pulmonary:     Effort: Pulmonary effort is normal. No accessory muscle usage or respiratory distress.     Breath sounds: Decreased air movement (mild) present. No decreased breath  sounds, wheezing, rhonchi or rales.  Abdominal:     General: Abdomen is flat. Bowel sounds are normal. There is no distension.     Palpations: Abdomen is soft. There is no mass.     Tenderness: There is no abdominal tenderness.     Hernia: No hernia is present.  Skin:    General: Skin is warm and dry.     Capillary Refill: Capillary refill takes less than 2 seconds.  Neurological:     General: No focal deficit present.     Mental Status: He is alert and oriented to person, place, and time. Mental status is at baseline.  Psychiatric:        Attention and Perception: Attention normal.        Mood and Affect: Mood and affect normal.        Behavior: Behavior normal. Behavior is cooperative.        Thought Content: Thought content normal.        Judgment: Judgment normal.    Results for orders placed or performed in visit on 04/03/18  POCT glycosylated hemoglobin (Hb A1C)  Result Value Ref Range   Hemoglobin A1C 6.2 (A) 4.0 - 5.6 %   HbA1c POC (<> result, manual entry)     HbA1c, POC (prediabetic range)     HbA1c, POC (controlled diabetic range)        Assessment & Plan:   Problem List Items Addressed This Visit      Cardiovascular and Mediastinum   Essential hypertension    Controlled and at goal of < 130/80.  Pt is currently on hydrochlorothiazide, losartan, and metoprolol, which he is tolerating well without side effects  Plan: 1. Continue hydrochlorothiazide 12.5 mg once daily.  - Continue losartan 50 mg once daily. - Continue metoprolol without changes 2. Labs today. 3. Follow-up 6 months.        Respiratory   COPD (chronic obstructive pulmonary disease) (HCC)    Controlled on current medication regimen with full control of symptoms.  Patient continues to perform normal daily activities and regular exercise    Plan: 1. Encouraged regular physical activity. 2. STOP Incruse and Symbicort.   3. START Anoro one puff once daily.  If not covered by insurance, recommend  continuing only LAMA, LABA separately with Incruse and LABA. 4. Continue albuterol prn.  Reinforced use during periods with worsening pulmonary function and only prn for rescue. 5. Followup in 6 months      Relevant Medications   umeclidinium-vilanterol (ANORO ELLIPTA) 62.5-25 MCG/INH AEPB     Endocrine   Type 2 diabetes mellitus  with other specified complication ([place]) - Primary    Stable and controlled.  Continues to improve with new A1c at goal off medications over last 3 months.  Currently at goal of A1c < 7%.  Pt has diabetes and is currently managing with diet and exercise.  Good lifestyle management with change from white rice to brown rice and adding more vegetables.  Plan: 1. Continue off medications - reinforced need for ongoing lifestyle modifications. 2. Continue checking CBG fasting in am.  Goal 80-120.  If regularly over 120, will resume metformin.  Patient to call clinic. 3. Encourage increased physical activity, reinforced low glycemic diet. 4. Continue ARB, statin.  5. Follow up 6 months.      Relevant Orders   POCT glycosylated hemoglobin (Hb A1C) (Completed)   COMPLETE METABOLIC PANEL WITH GFR   Lipid panel    Other Visit Diagnoses    Need for 23-polyvalent pneumococcal polysaccharide vaccine       Relevant Orders   Pneumococcal polysaccharide vaccine 23-valent greater than or equal to 2yo subcutaneous/IM (Completed)    Patient due for PPSV23 today.  Administer and repeat in 5 years for high risk with DMT2 and COPD.   Meds ordered this encounter  Medications  . umeclidinium-vilanterol (ANORO ELLIPTA) 62.5-25 MCG/INH AEPB    Sig: Inhale 1 puff into the lungs daily.    Dispense:  90 each    Refill:  1    Order Specific Question:   Supervising Provider    Answer:   Olin Hauser [[id number]]    Follow up plan: Return in about 6 months (around 10/02/2018) for COPD, diabetes, hypertension.  [name], DNP, AGPCNP-BC Adult Gerontology Primary Care  Nurse Practitioner [facility] 04/03/2018, 8:24 AM

## 2018-04-03 NOTE — Patient Instructions (Addendum)
Jonathan Willis,   Thank you for coming in to clinic today.  1. Great work with Diabetes!  Keep eating healthy foods with brown rice.  2. Stay active!  Keep walking!  3. STAY off of metformin.  4. CHANGE breathing medicines if insurance will cover the new meds. - STOP Incruse and Symbicort - START Anoro one puff once daily. (This device is like Incruse). - KEEP red RESCUE inhaler albuterol.  USE only as needed for shortness of breath.   Please schedule a follow-up appointment with [name], AGNP. Return in about 6 months (around 10/02/2018) for COPD, diabetes, hypertension.  If you have any other questions or concerns, please feel free to call the clinic or send a message through [place]. You may also schedule an earlier appointment if necessary.  You will receive a survey after today's visit either digitally by e-mail or paper by [employer]. Your experiences and feedback matter to [place].  Please respond so we know how we are doing as we provide care for you.   [name], DNP, AGNP-BC Adult Gerontology Nurse Practitioner [place]

## 2018-04-03 NOTE — Assessment & Plan Note (Signed)
Stable and controlled.  Continues to improve with new A1c at goal off medications over last 3 months.  Currently at goal of A1c < 7%.  Pt has diabetes and is currently managing with diet and exercise.  Good lifestyle management with change from white rice to brown rice and adding more vegetables.  Plan: 1. Continue off medications - reinforced need for ongoing lifestyle modifications. 2. Continue checking CBG fasting in am.  Goal 80-120.  If regularly over 120, will resume metformin.  Patient to call clinic. 3. Encourage increased physical activity, reinforced low glycemic diet. 4. Continue ARB, statin.  5. Follow up 6 months.

## 2018-04-04 ENCOUNTER — Other Ambulatory Visit: Payer: Self-pay | Admitting: Urology

## 2018-04-04 ENCOUNTER — Other Ambulatory Visit: Payer: Medicare Other

## 2018-04-04 ENCOUNTER — Encounter: Payer: Self-pay | Admitting: Urology

## 2018-04-04 ENCOUNTER — Ambulatory Visit (INDEPENDENT_AMBULATORY_CARE_PROVIDER_SITE_OTHER): Payer: Medicare Other | Admitting: Urology

## 2018-04-04 VITALS — BP 167/68 | HR 94 | Ht 60.0 in | Wt 107.3 lb

## 2018-04-04 DIAGNOSIS — N3281 Overactive bladder: Secondary | ICD-10-CM

## 2018-04-04 DIAGNOSIS — N401 Enlarged prostate with lower urinary tract symptoms: Secondary | ICD-10-CM

## 2018-04-04 DIAGNOSIS — E1169 Type 2 diabetes mellitus with other specified complication: Secondary | ICD-10-CM | POA: Diagnosis not present

## 2018-04-04 DIAGNOSIS — R3914 Feeling of incomplete bladder emptying: Secondary | ICD-10-CM

## 2018-04-04 DIAGNOSIS — R351 Nocturia: Secondary | ICD-10-CM

## 2018-04-04 LAB — COMPLETE METABOLIC PANEL WITH GFR
AG Ratio: 1.6 (calc) (ref 1.0–2.5)
ALT: 31 U/L (ref 9–46)
AST: 23 U/L (ref 10–35)
Albumin: 4.4 g/dL (ref 3.6–5.1)
Alkaline phosphatase (APISO): 63 U/L (ref 40–115)
BUN/Creatinine Ratio: 29 (calc) — ABNORMAL HIGH (ref 6–22)
BUN: 29 mg/dL — ABNORMAL HIGH (ref 7–25)
CO2: 29 mmol/L (ref 20–32)
Calcium: 9.8 mg/dL (ref 8.6–10.3)
Chloride: 104 mmol/L (ref 98–110)
Creat: 0.99 mg/dL (ref 0.70–1.18)
GFR, Est African American: 87 mL/min/{1.73_m2} (ref >=60)
GFR, Est Non African American: 75 mL/min/{1.73_m2} (ref >=60)
Globulin: 2.8 g/dL (calc) (ref 1.9–3.7)
Glucose, Bld: 107 mg/dL — ABNORMAL HIGH (ref 65–99)
Potassium: 4.6 mmol/L (ref 3.5–5.3)
Sodium: 140 mmol/L (ref 135–146)
Total Bilirubin: 0.6 mg/dL (ref 0.2–1.2)
Total Protein: 7.2 g/dL (ref 6.1–8.1)

## 2018-04-04 LAB — LIPID PANEL
Cholesterol: 135 mg/dL (ref <200)
HDL: 53 mg/dL (ref >40)
LDL Cholesterol (Calc): 70 mg/dL (calc)
Non-HDL Cholesterol (Calc): 82 mg/dL (calc) (ref <130)
Total CHOL/HDL Ratio: 2.5 (calc) (ref <5.0)
Triglycerides: 50 mg/dL (ref <150)

## 2018-04-04 LAB — BLADDER SCAN AMB NON-IMAGING

## 2018-04-04 MED ORDER — SOLIFENACIN SUCCINATE 5 MG PO TABS: 5.0000 mg | ORAL_TABLET | Freq: Every day | ORAL | 2 refills | Status: DC

## 2018-04-04 NOTE — Progress Notes (Signed)
04/04/2018 10:48 AM   Jonathan Willis June 30, 1945 [id number]  Referring provider: Mikey College, NP [address] [place] [address]  Chief Complaint  Patient presents with  . Follow-up    HPI: Jonathan Willis is a 73 yo M with a history of BPH with LUTS.  -Present with his interpreter Jonathan Willis -Symptoms of incomplete bladder emptying, urinary frequency, nocturia 5x nightly.  -Denies leakage, hx of UTI, dysuria, gross hematuria, flank/abdominal/pelvis/scrotal pain and associated symptoms.  -Currently taking 0.8 mg of Tamsulosin daily which he has been on for a few months. Medication has minimal effect  -Never stopped taking flomax, never picked up Vesicare 5 mg; no change in urinary symptoms  -His PSA trend is as follows: 0.81 (02/01/2014), 1.6 (06/29/2017), and 1.1(01/01/2018). PSA drawn today. His PVR today was 14 mL. His IPSS is 15 today (see below). Microscopic/Dipstick Negative.  PMH: Past Medical History:  Diagnosis Date  . Chronic cough 01/16/2017  . Diabetes mellitus without complication ([place])   . External hemorrhoid   . 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 History of TB (tuberculosis)   . Hypertension   . Lower GI bleed     Surgical History: Past Surgical History:  Procedure Laterality Date  . COLONOSCOPY WITH PROPOFOL N/A 04/10/2017   Procedure: COLONOSCOPY WITH PROPOFOL;  Surgeon: Jonathan Bellows, MD;  Location: [facility] ENDOSCOPY;  Service: Gastroenterology;  Laterality: N/A;  . NO PAST SURGERIES      Home Medications:  Allergies as of 04/04/2018   No Known Allergies     Medication List       Accurate as of April 04, 2018 10:48 AM. Always use your most recent med list.        ACCU-CHEK AVIVA PLUS test strip Generic drug:  glucose blood TEST ONCE DAILY   ACCU-CHEK SOFTCLIX LANCETS lancets TEST ONCE DAILY   albuterol 108 (90 Base) MCG/ACT inhaler Commonly known as:  PROVENTIL HFA;VENTOLIN HFA Inhale 2 puffs  into the lungs every 6 (six) hours as needed for wheezing or shortness of breath.   aspirin 81 MG chewable tablet Chew by mouth daily.   atorvastatin 20 MG tablet Commonly known as:  LIPITOR TAKE 1 TABLET BY MOUTH EVERY DAY   baclofen 10 MG tablet Commonly known as:  LIORESAL TAKE 1 TABLET BY MOUTH THREE TIMES A DAY   blood glucose meter kit and supplies Dispense based on patient and insurance preference. Use one time daily as directed. (FOR ICD-10 E10.9, E11.9).   hydrochlorothiazide 12.5 MG tablet Commonly known as:  HYDRODIURIL TAKE 1 TABLET BY MOUTH EVERY DAY   INCRUSE ELLIPTA 62.5 MCG/INH Aepb Generic drug:  umeclidinium bromide INHALE 1 PUFF DAILY INTO THE LUNGS.   losartan 50 MG tablet Commonly known as:  COZAAR Take 50 mg by mouth daily.   meclizine 12.5 MG tablet Commonly known as:  ANTIVERT Take 1 tablet (12.5 mg total) by mouth 3 (three) times daily as needed for dizziness or nausea.   metoprolol tartrate 50 MG tablet Commonly known as:  LOPRESSOR TAKE 1 TABLET BY MOUTH TWICE A DAY   solifenacin 5 MG tablet Commonly known as:  VESICARE Take 1 tablet (5 mg total) by mouth daily.   Spacer/Aero-Hold Chamber Bags Misc 1 Device by Does not apply route every 6 (six) hours as needed (with inhaler).   SYMBICORT 160-4.5 MCG/ACT inhaler Generic drug:  budesonide-formoterol TAKE 2 PUFFS BY MOUTH TWICE A DAY   tamsulosin  0.4 MG Caps capsule Commonly known as:  FLOMAX Take 2 capsules (0.8 mg total) by mouth daily.   umeclidinium-vilanterol 62.5-25 MCG/INH Aepb Commonly known as:  ANORO ELLIPTA Inhale 1 puff into the lungs daily.       Allergies: No Known Allergies  Family History: Family History  Problem Relation Age of Onset  . COPD Neg Hx   . Diabetes Mellitus II Neg Hx   . Hypertension Neg Hx     Social History:  reports that he quit smoking about 11 years ago. He has never used smokeless tobacco. He reports that he does not drink alcohol or use  drugs.  ROS: UROLOGY Frequent Urination?: Yes Hard to postpone urination?: Yes Burning/pain with urination?: No Get up at night to urinate?: Yes Leakage of urine?: No Urine stream starts and stops?: Yes Trouble starting stream?: No Do you have to strain to urinate?: No Blood in urine?: No Urinary tract infection?: No Sexually transmitted disease?: No Injury to kidneys or bladder?: No Painful intercourse?: No Weak stream?: No Erection problems?: No Penile pain?: No  Gastrointestinal Nausea?: No Vomiting?: No Indigestion/heartburn?: No Diarrhea?: No Constipation?: No  Constitutional Fever: No Night sweats?: No Weight loss?: Yes Fatigue?: Yes  Skin Skin rash/lesions?: No Itching?: No  Eyes Blurred vision?: No Double vision?: Yes  Ears/Nose/Throat Sore throat?: No Sinus problems?: No  Hematologic/Lymphatic Swollen glands?: No Easy bruising?: No  Cardiovascular Leg swelling?: No Chest pain?: No  Respiratory Cough?: Yes Shortness of breath?: Yes  Endocrine Excessive thirst?: No  Musculoskeletal Back pain?: Yes Joint pain?: No  Neurological Headaches?: Yes Dizziness?: Yes  Psychologic Depression?: No Anxiety?: No  Physical Exam: BP (!) 167/68 (BP Location: Left Arm, Patient Position: Sitting, Cuff Size: Normal)   Pulse 94   Ht 5' (1.524 m)   Wt 107 lb 4.8 oz (48.7 kg)   BMI 20.96 kg/m   Constitutional:  Alert and oriented, No acute distress. HEENT: [place] AT, moist mucus membranes.  Trachea midline, no masses. Cardiovascular: No clubbing, cyanosis, or edema. Respiratory: Normal respiratory effort, no increased work of breathing. Skin: No rashes, bruises or suspicious lesions. Neurologic: Grossly intact, no focal deficits, moving all 4 extremities. Psychiatric: Normal mood and affect.  Pertinent Imaging: Results for orders placed or performed in visit on 04/04/18  Bladder Scan (Post Void Residual) in office  Result Value Ref Range   Scan  Result 65m    Assessment & Plan:   1. BPH with LUTS -Symptoms storage related. Small prostate on exam. Symptoms may be more related to overactive bladder.  -No improvement in symptoms   2. Overactive Bladder  -PVR is 12 mL  -No change in urinary symptoms; never stopped flomax and never picked up Vesicare; Rx of Vesicare 5 mg resent.  -F/u in 4 weeks with SLarene Beachand if no improvement in symptoms, schedule cysto   [facility] in about 4 weeks (around 05/02/2018) [facility]  [facility] [address] [place] [place] [id number]([phone] I, NLucas Mallow am acting as a scribe for Dr. SNicki ReaperC. Stoioff,  I, SAbbie Sons MD, have reviewed all documentation for this visit. The documentation on 04/04/18 for the exam, diagnosis, procedures, and orders are all accurate and complete.

## 2018-04-07 ENCOUNTER — Other Ambulatory Visit: Payer: Self-pay | Admitting: Nurse Practitioner

## 2018-04-13 ENCOUNTER — Other Ambulatory Visit: Payer: Self-pay | Admitting: Nurse Practitioner

## 2018-04-13 DIAGNOSIS — I10 Essential (primary) hypertension: Secondary | ICD-10-CM

## 2018-04-14 ENCOUNTER — Other Ambulatory Visit: Payer: Self-pay | Admitting: Nurse Practitioner

## 2018-04-14 DIAGNOSIS — J431 Panlobular emphysema: Secondary | ICD-10-CM

## 2018-04-20 ENCOUNTER — Telehealth: Payer: Self-pay | Admitting: Nurse Practitioner

## 2018-04-20 NOTE — Telephone Encounter (Signed)
Patient's dentist called to ask about pre-procedure prophylaxis and aspirin therapy recommendations prior to a deep cleaning. - Patient may stop aspirin 7 days prior to procedure. - he will not need prophylaxis with antibiotics given he is not at high risk.     According to available information in up-to-date, "prophylaxis is suggested only in settings associated with the highest risk of an adverse outcome if IE occurs. This includes patients with: ?Prosthetic heart valves, including mechanical, bioprosthetic, and homograft valves (transcatheter-implanted as well as surgically implanted valves are included) ?Prosthetic material used for cardiac valve repair, such as annuloplasty rings and chords ?A prior history of IE ?Unrepaired cyanotic congenital heart disease ?Repaired congenital heart disease with residual shunts or valvular regurgitation at the site or adjacent to the site of the prosthetic patch or prosthetic device ?Repaired congenital heart defects with catheter-based intervention involving an occlusion device or stent during the first six months after the procedure ?Valve regurgitation due to a structurally abnormal valve in a transplanted heart."

## 2018-04-25 ENCOUNTER — Other Ambulatory Visit: Payer: Self-pay | Admitting: Nurse Practitioner

## 2018-05-03 ENCOUNTER — Encounter: Payer: Self-pay | Admitting: Urology

## 2018-05-03 ENCOUNTER — Ambulatory Visit: Payer: Medicare Other | Admitting: Urology

## 2018-05-12 ENCOUNTER — Other Ambulatory Visit: Payer: Self-pay | Admitting: Nurse Practitioner

## 2018-05-12 DIAGNOSIS — J431 Panlobular emphysema: Secondary | ICD-10-CM

## 2018-05-14 MED ORDER — UMECLIDINIUM BROMIDE 62.5 MCG/INH IN AEPB: 1.0000 | INHALATION_SPRAY | Freq: Every day | RESPIRATORY_TRACT | 4 refills | Status: DC

## 2018-05-15 ENCOUNTER — Other Ambulatory Visit: Payer: Self-pay | Admitting: Nurse Practitioner

## 2018-05-15 ENCOUNTER — Encounter: Payer: Self-pay | Admitting: Nurse Practitioner

## 2018-05-15 ENCOUNTER — Other Ambulatory Visit: Payer: Self-pay

## 2018-05-15 ENCOUNTER — Ambulatory Visit (INDEPENDENT_AMBULATORY_CARE_PROVIDER_SITE_OTHER): Payer: Medicare Other | Admitting: Nurse Practitioner

## 2018-05-15 VITALS — BP 131/57 | HR 88 | Temp 98.0°F | Resp 20 | Ht 60.0 in | Wt 110.4 lb

## 2018-05-15 DIAGNOSIS — R42 Dizziness and giddiness: Secondary | ICD-10-CM

## 2018-05-15 DIAGNOSIS — E1169 Type 2 diabetes mellitus with other specified complication: Secondary | ICD-10-CM

## 2018-05-15 DIAGNOSIS — I10 Essential (primary) hypertension: Secondary | ICD-10-CM | POA: Diagnosis not present

## 2018-05-15 DIAGNOSIS — J431 Panlobular emphysema: Secondary | ICD-10-CM

## 2018-05-15 DIAGNOSIS — E119 Type 2 diabetes mellitus without complications: Secondary | ICD-10-CM

## 2018-05-15 DIAGNOSIS — E785 Hyperlipidemia, unspecified: Secondary | ICD-10-CM | POA: Diagnosis not present

## 2018-05-15 DIAGNOSIS — J441 Chronic obstructive pulmonary disease with (acute) exacerbation: Secondary | ICD-10-CM | POA: Diagnosis not present

## 2018-05-15 MED ORDER — MECLIZINE HCL 12.5 MG PO TABS: 12.5000 mg | ORAL_TABLET | Freq: Three times a day (TID) | ORAL | 1 refills | Status: DC | PRN

## 2018-05-15 MED ORDER — PREDNISONE 50 MG PO TABS: 50.0000 mg | ORAL_TABLET | Freq: Every day | ORAL | 0 refills | Status: AC

## 2018-05-15 MED ORDER — ALBUTEROL SULFATE HFA 108 (90 BASE) MCG/ACT IN AERS: 2.0000 | INHALATION_SPRAY | Freq: Four times a day (QID) | RESPIRATORY_TRACT | 2 refills | Status: DC | PRN

## 2018-05-15 MED ORDER — UMECLIDINIUM-VILANTEROL 62.5-25 MCG/INH IN AEPB: 1.0000 | INHALATION_SPRAY | Freq: Every day | RESPIRATORY_TRACT | 1 refills | Status: DC

## 2018-05-15 MED ORDER — HYDROCHLOROTHIAZIDE 12.5 MG PO TABS: 12.5000 mg | ORAL_TABLET | Freq: Every day | ORAL | 1 refills | Status: DC

## 2018-05-15 MED ORDER — ATORVASTATIN CALCIUM 20 MG PO TABS: 20.0000 mg | ORAL_TABLET | Freq: Every day | ORAL | 1 refills | Status: DC

## 2018-05-15 NOTE — Progress Notes (Signed)
Subjective:    Patient ID: Jonathan Willis, male    DOB: 11-22-1945, 73 y.o.   MRN: [id number]  Jonathan Willis is a 73 y.o. male presenting on 05/15/2018 for Cough (more frequent at bedtime x 1 week ) and Dizziness (x 1 week. Pt states it feels like the house is spinning when he moves around )   HPI Cough Cough and chest tightness with cough.  Increased cough at night.  Patient reports his cough is not usually congested, but is occasional. - No fever, chills, or sweats. - Patient denies sinus pressure, runny nose. - Patient is not around anyone else who is sick, but about 2 weeks ago grandkids were sick. - uses albuterol one time daily - Uses Anoro once daily.  Continues using incruse if he is not breathing well.  Dizziness Increased dizziness started last week.  These symptoms are similar from prior dizziness with vertigo, but has run out of refills. - Patient takes meclizine only as needed, uses 2-3 per week.   Social History   Tobacco Use  . Smoking status: Former Smoker    Last attempt to quit: 01/11/2007    Years since quitting: 11.3  . Smokeless tobacco: Never Used  Substance Use Topics  . Alcohol use: No  . Drug use: No    Review of Systems Per HPI unless specifically indicated above     Objective:    BP (!) 131/57 (BP Location: Right Arm, Patient Position: Sitting, Cuff Size: Normal)   Pulse 88   Temp 98 F (36.7 C) (Oral)   Resp 20   Ht 5' (1.524 m)   Wt 110 lb 6.4 oz (50.1 kg)   SpO2 100%   BMI 21.56 kg/m   Wt Readings from Last 3 Encounters:  05/15/18 110 lb 6.4 oz (50.1 kg)  04/04/18 107 lb 4.8 oz (48.7 kg)  04/03/18 107 lb 12.8 oz (48.9 kg)    Physical Exam Vitals signs reviewed.  Constitutional:      General: He is not in acute distress.    Appearance: He is well-developed.  HENT:     Head: Normocephalic and atraumatic.  Cardiovascular:     Rate and Rhythm: Normal rate and regular rhythm.     Pulses:          Radial pulses are 2+ on the right side and  2+ on the left side.       Posterior tibial pulses are 1+ on the right side and 1+ on the left side.     Heart sounds: Normal heart sounds, S1 normal and S2 normal.  Pulmonary:     Effort: Pulmonary effort is normal. No accessory muscle usage or respiratory distress.     Breath sounds: Normal air entry. Wheezing (throughout lungs and respiratory cycle, greatest w/ expiration) present.  Musculoskeletal:     Right lower leg: No edema.     Left lower leg: No edema.  Skin:    General: Skin is warm and dry.     Capillary Refill: Capillary refill takes less than 2 seconds.  Neurological:     General: No focal deficit present.     Mental Status: He is alert and oriented to person, place, and time. Mental status is at baseline.  Psychiatric:        Attention and Perception: Attention normal.        Mood and Affect: Mood and affect normal.        Behavior: Behavior normal. Behavior is cooperative.  Results for orders placed or performed in visit on 04/04/18  Bladder Scan (Post Void Residual) in office  Result Value Ref Range   Scan Result 61mL       Assessment & Plan:   Problem List Items Addressed This Visit      Cardiovascular and Mediastinum   Essential hypertension Stable. Patient needs refills.  Placed today.  Follow-up as scheduled.   Relevant Medications   atorvastatin (LIPITOR) 20 MG tablet   hydrochlorothiazide (HYDRODIURIL) 12.5 MG tablet      Other Visit Diagnoses    COPD with acute exacerbation ([place])    -  Primary Patient with acute copd exacerbation today with increased cough.  No signs and symptoms of focal infection.  Plan: 1. Continue Anoro. 2. Continue albuterol prn - for 5 days take at least 3 times daily. 3. START prednisone 50 mg tab one tab daily x 5 days. 4. FOLLOW-UP prn if worsening symptoms.   Relevant Medications   predniSONE (DELTASONE) 50 MG tablet   albuterol (PROVENTIL HFA;VENTOLIN HFA) 108 (90 Base) MCG/ACT inhaler   umeclidinium-vilanterol  (ANORO ELLIPTA) 62.5-25 MCG/INH AEPB   Vertigo     Patient with vertigo exacerbation.  No current supply of meclizine and is worsening over past 1 week.   - Patient without any signs and symptoms of focal URI infection.  Plan: 1. Resume meclizine prn. 2. Follow-up prn.   Relevant Medications   meclizine (ANTIVERT) 12.5 MG tablet   Mixed Hyperlipidemia - Continue atorvastatin.  Patient requested refill today.  Refill sent.  Follow-up as scheduled.    Relevant Medications   atorvastatin (LIPITOR) 20 MG tablet      Meds ordered this encounter  Medications  . meclizine (ANTIVERT) 12.5 MG tablet    Sig: Take 1 tablet (12.5 mg total) by mouth 3 (three) times daily as needed for dizziness.    Dispense:  90 tablet    Refill:  1    Order Specific Question:   Supervising Provider    Answer:   Olin Hauser [[id number]]  . atorvastatin (LIPITOR) 20 MG tablet    Sig: Take 1 tablet (20 mg total) by mouth daily.    Dispense:  90 tablet    Refill:  1    Order Specific Question:   Supervising Provider    Answer:   Olin Hauser [[id number]]  . predniSONE (DELTASONE) 50 MG tablet    Sig: Take 1 tablet (50 mg total) by mouth daily with breakfast for 5 days.    Dispense:  5 tablet    Refill:  0    Order Specific Question:   Supervising Provider    Answer:   Olin Hauser [[id number]]  . albuterol (PROVENTIL HFA;VENTOLIN HFA) 108 (90 Base) MCG/ACT inhaler    Sig: Inhale 2 puffs into the lungs every 6 (six) hours as needed for wheezing or shortness of breath.    Dispense:  1 Inhaler    Refill:  2    Product selection permitted per insurance preference.    Order Specific Question:   Supervising Provider    Answer:   Olin Hauser [[id number]]  . umeclidinium-vilanterol (ANORO ELLIPTA) 62.5-25 MCG/INH AEPB    Sig: Inhale 1 puff into the lungs daily.    Dispense:  90 each    Refill:  1    Order Specific Question:   Supervising Provider    Answer:   Olin Hauser  [[id number]]  . hydrochlorothiazide (HYDRODIURIL) 12.5 MG tablet  Sig: Take 1 tablet (12.5 mg total) by mouth daily.    Dispense:  90 tablet    Refill:  1    Order Specific Question:   Supervising Provider    Answer:   Olin Hauser [[id number]]    Follow up plan: Return in about 3 months (around 08/13/2018) for COPD.  [name], DNP, AGPCNP-BC Adult Gerontology Primary Care Nurse Practitioner [facility] 05/15/2018, 2:02 PM

## 2018-05-15 NOTE — Patient Instructions (Addendum)
Jonathan Willis,   Thank you for coming in to clinic today.  1. It is okay to take albuterol inhaler 2-3 times per day for 5 days.  2. Continue only Anoro (red) inhaler. - He should stop all other inhalers.  3. START prednisone 50 mg one tablet daily for 5 days.  Please schedule a follow-up appointment with [name], AGNP. Return in about 3 months (around 08/13/2018) for COPD.  If you have any other questions or concerns, please feel free to call the clinic or send a message through [place]. You may also schedule an earlier appointment if necessary.  You will receive a survey after today's visit either digitally by e-mail or paper by [employer]. Your experiences and feedback matter to [place].  Please respond so we know how we are doing as we provide care for you.   [name], DNP, AGNP-BC Adult Gerontology Nurse Practitioner [facility]

## 2018-06-05 ENCOUNTER — Telehealth: Payer: Self-pay | Admitting: *Deleted

## 2018-06-05 ENCOUNTER — Ambulatory Visit (INDEPENDENT_AMBULATORY_CARE_PROVIDER_SITE_OTHER): Payer: Medicare Other

## 2018-06-05 ENCOUNTER — Other Ambulatory Visit: Payer: Self-pay

## 2018-06-05 ENCOUNTER — Ambulatory Visit (INDEPENDENT_AMBULATORY_CARE_PROVIDER_SITE_OTHER): Payer: Medicare Other | Admitting: Nurse Practitioner

## 2018-06-05 ENCOUNTER — Encounter: Payer: Self-pay | Admitting: Nurse Practitioner

## 2018-06-05 VITALS — BP 141/56 | HR 79 | Temp 97.7°F | Resp 16 | Ht 60.0 in | Wt 110.6 lb

## 2018-06-05 VITALS — BP 141/56 | HR 79 | Temp 97.7°F | Resp 16 | Ht 60.0 in | Wt 110.4 lb

## 2018-06-05 DIAGNOSIS — Z0000 Encounter for general adult medical examination without abnormal findings: Secondary | ICD-10-CM

## 2018-06-05 DIAGNOSIS — Z122 Encounter for screening for malignant neoplasm of respiratory organs: Secondary | ICD-10-CM

## 2018-06-05 DIAGNOSIS — J441 Chronic obstructive pulmonary disease with (acute) exacerbation: Secondary | ICD-10-CM | POA: Diagnosis not present

## 2018-06-05 DIAGNOSIS — Z87891 Personal history of nicotine dependence: Secondary | ICD-10-CM

## 2018-06-05 MED ORDER — PREDNISONE 50 MG PO TABS: 50.0000 mg | ORAL_TABLET | Freq: Every day | ORAL | 0 refills | Status: AC

## 2018-06-05 MED ORDER — AZITHROMYCIN 250 MG PO TABS: ORAL_TABLET | ORAL | 0 refills | Status: DC

## 2018-06-05 NOTE — Telephone Encounter (Signed)
Received referral for initial lung cancer screening scan. Contacted patient's daughter and obtained smoking history,(former, quit 01/11/2007, 66 pack year) as well as answering questions related to screening process. Patient's daughter denies signs of lung cancer such as weight loss or hemoptysis. Patient' daughter denies comorbidity that would prevent curative treatment if lung cancer were found. Patient is scheduled for shared decision making visit and CT scan on 06/26/18 at 130 with interpreter requested.

## 2018-06-05 NOTE — Patient Instructions (Signed)
Jonathan Willis , Thank you for taking time to come for your Medicare Wellness Visit. I appreciate your ongoing commitment to your health goals. Please review the following plan we discussed and let me know if I can assist you in the future.   Screening recommendations/referrals: Colonoscopy: completed 04/10/2017 Recommended yearly ophthalmology/optometry visit for glaucoma screening and checkup Recommended yearly dental visit for hygiene and checkup  Vaccinations: Influenza vaccine: up to date Pneumococcal vaccine: up to date Tdap vaccine: due, check with your insurance company for coverage  Shingles vaccine: n/a  Advanced directives: n/a  Conditions/risks identified: COPD, cough today- seeing Lissa Merlin  Next appointment: Follow up in one year for your annual wellness exam.   Preventive Care 75 Years and Older, Male Preventive care refers to lifestyle choices and visits with your health care provider that can promote health and wellness. What does preventive care include?  A yearly physical exam. This is also called an annual well check.  Dental exams once or twice a year.  Routine eye exams. Ask your health care provider how often you should have your eyes checked.  Personal lifestyle choices, including:  Daily care of your teeth and gums.  Regular physical activity.  Eating a healthy diet.  Avoiding tobacco and drug use.  Limiting alcohol use.  Practicing safe sex.  Taking low doses of aspirin every day.  Taking vitamin and mineral supplements as recommended by your health care provider. What happens during an annual well check? The services and screenings done by your health care provider during your annual well check will depend on your age, overall health, lifestyle risk factors, and family history of disease. Counseling  Your health care provider may ask you questions about your:  Alcohol use.  Tobacco use.  Drug use.  Emotional well-being.  Home and  relationship well-being.  Sexual activity.  Eating habits.  History of falls.  Memory and ability to understand (cognition).  Work and work Statistician. Screening  You may have the following tests or measurements:  Height, weight, and BMI.  Blood pressure.  Lipid and cholesterol levels. These may be checked every 5 years, or more frequently if you are over 68 years old.  Skin check.  Lung cancer screening. You may have this screening every year starting at age 40 if you have a 30-pack-year history of smoking and currently smoke or have quit within the past 15 years.  Fecal occult blood test (FOBT) of the stool. You may have this test every year starting at age 47.  Flexible sigmoidoscopy or colonoscopy. You may have a sigmoidoscopy every 5 years or a colonoscopy every 10 years starting at age 46.  Prostate cancer screening. Recommendations will vary depending on your family history and other risks.  Hepatitis C blood test.  Hepatitis B blood test.  Sexually transmitted disease (STD) testing.  Diabetes screening. This is done by checking your blood sugar (glucose) after you have not eaten for a while (fasting). You may have this done every 1-3 years.  Abdominal aortic aneurysm (AAA) screening. You may need this if you are a current or former smoker.  Osteoporosis. You may be screened starting at age 35 if you are at high risk. Talk with your health care provider about your test results, treatment options, and if necessary, the need for more tests. Vaccines  Your health care provider may recommend certain vaccines, such as:  Influenza vaccine. This is recommended every year.  Tetanus, diphtheria, and acellular pertussis (Tdap, Td) vaccine. You  may need a Td booster every 10 years.  Zoster vaccine. You may need this after age 27.  Pneumococcal 13-valent conjugate (PCV13) vaccine. One dose is recommended after age 30.  Pneumococcal polysaccharide (PPSV23) vaccine. One  dose is recommended after age 65. Talk to your health care provider about which screenings and vaccines you need and how often you need them. This information is not intended to replace advice given to you by your health care provider. Make sure you discuss any questions you have with your health care provider. Document Released: 04/10/2015 Document Revised: 12/02/2015 Document Reviewed: 01/13/2015 Elsevier Interactive Patient Education  2017 [place] Prevention in the Home Falls can cause injuries. They can happen to people of all ages. There are many things you can do to make your home safe and to help prevent falls. What can I do on the outside of my home?  Regularly fix the edges of walkways and driveways and fix any cracks.  Remove anything that might make you trip as you walk through a door, such as a raised step or threshold.  Trim any bushes or trees on the path to your home.  Use bright outdoor lighting.  Clear any walking paths of anything that might make someone trip, such as rocks or tools.  Regularly check to see if handrails are loose or broken. Make sure that both sides of any steps have handrails.  Any raised decks and porches should have guardrails on the edges.  Have any leaves, snow, or ice cleared regularly.  Use sand or salt on walking paths during winter.  Clean up any spills in your garage right away. This includes oil or grease spills. What can I do in the bathroom?  Use night lights.  Install grab bars by the toilet and in the tub and shower. Do not use towel bars as grab bars.  Use non-skid mats or decals in the tub or shower.  If you need to sit down in the shower, use a plastic, non-slip stool.  Keep the floor dry. Clean up any water that spills on the floor as soon as it happens.  Remove soap buildup in the tub or shower regularly.  Attach bath mats securely with double-sided non-slip rug tape.  Do not have throw rugs and other  things on the floor that can make you trip. What can I do in the bedroom?  Use night lights.  Make sure that you have a light by your bed that is easy to reach.  Do not use any sheets or blankets that are too big for your bed. They should not hang down onto the floor.  Have a firm chair that has side arms. You can use this for support while you get dressed.  Do not have throw rugs and other things on the floor that can make you trip. What can I do in the kitchen?  Clean up any spills right away.  Avoid walking on wet floors.  Keep items that you use a lot in easy-to-reach places.  If you need to reach something above you, use a strong step stool that has a grab bar.  Keep electrical cords out of the way.  Do not use floor polish or wax that makes floors slippery. If you must use wax, use non-skid floor wax.  Do not have throw rugs and other things on the floor that can make you trip. What can I do with my stairs?  Do not leave any items  on the stairs.  Make sure that there are handrails on both sides of the stairs and use them. Fix handrails that are broken or loose. Make sure that handrails are as long as the stairways.  Check any carpeting to make sure that it is firmly attached to the stairs. Fix any carpet that is loose or worn.  Avoid having throw rugs at the top or bottom of the stairs. If you do have throw rugs, attach them to the floor with carpet tape.  Make sure that you have a light switch at the top of the stairs and the bottom of the stairs. If you do not have them, ask someone to add them for you. What else can I do to help prevent falls?  Wear shoes that:  Do not have high heels.  Have rubber bottoms.  Are comfortable and fit you well.  Are closed at the toe. Do not wear sandals.  If you use a stepladder:  Make sure that it is fully opened. Do not climb a closed stepladder.  Make sure that both sides of the stepladder are locked into place.  Ask  someone to hold it for you, if possible.  Clearly mark and make sure that you can see:  Any grab bars or handrails.  First and last steps.  Where the edge of each step is.  Use tools that help you move around (mobility aids) if they are needed. These include:  Canes.  Walkers.  Scooters.  Crutches.  Turn on the lights when you go into a dark area. Replace any light bulbs as soon as they burn out.  Set up your furniture so you have a clear path. Avoid moving your furniture around.  If any of your floors are uneven, fix them.  If there are any pets around you, be aware of where they are.  Review your medicines with your doctor. Some medicines can make you feel dizzy. This can increase your chance of falling. Ask your doctor what other things that you can do to help prevent falls. This information is not intended to replace advice given to you by your health care provider. Make sure you discuss any questions you have with your health care provider. Document Released: 01/08/2009 Document Revised: 08/20/2015 Document Reviewed: 04/18/2014 Elsevier Interactive Patient Education  2017 [employer].

## 2018-06-05 NOTE — Patient Instructions (Addendum)
Jonathan Willis,   Thank you for coming in to clinic today.  1. Prednisone 50 mg one tablet by mouth 5 days  2. START azithromycin 250 mg tablet. Take 2 tablets today.  Then, take 1 tablet daily for the next 4 days and stop.  3. Inhalers: Continue only Anoro daily and albuterol as needed for rescue.  STOP all other inhalers.  Please schedule a follow-up appointment with [name], AGNP. Return 7-10 if symptoms worsen or fail to improve.  If you have any other questions or concerns, please feel free to call the clinic or send a message through [place]. You may also schedule an earlier appointment if necessary.  You will receive a survey after today's visit either digitally by e-mail or paper by [employer]. Your experiences and feedback matter to [place].  Please respond so we know how we are doing as we provide care for you.   [name], DNP, AGNP-BC Adult Gerontology Nurse Practitioner [place]

## 2018-06-05 NOTE — Progress Notes (Addendum)
Subjective:   Jonathan Willis is a 73 y.o. male who presents for Medicare Annual/Subsequent preventive examination.  Review of Systems:  Cardiac Risk Factors include: family history of premature cardiovascular disease;advanced age (>37mn, >>38women);male gender;diabetes mellitus;hypertension;dyslipidemia     Objective:    Vitals: BP (!) 141/56 (BP Location: Left Arm, Patient Position: Sitting, Cuff Size: Normal)   Pulse 79   Temp 97.7 F (36.5 C) (Oral)   Resp 16   Ht 5' (1.524 m)   Wt 110 lb 6.4 oz (50.1 kg)   SpO2 100%   BMI 21.56 kg/m   Body mass index is 21.56 kg/m.  Advanced Directives 06/02/2017 04/10/2017 02/07/2017 01/21/2017  Does Patient Have a Medical Advance Directive? No No No No  Would patient like information on creating a medical advance directive? No - Patient declined No - Patient declined No - Patient declined No - Patient declined  Pre-existing out of facility DNR order (yellow form or pink MOST form) - Yellow form placed in chart (order not valid for inpatient use) - -    Tobacco Social History   Tobacco Use  Smoking Status Former Smoker  . Last attempt to quit: 01/11/2007  . Years since quitting: 11.4  Smokeless Tobacco Never Used     Counseling given: Not Answered   Clinical Intake:  Pre-visit preparation completed: Yes  Pain : 0-10 Pain Score: 7  Pain Type: Acute pain Pain Location: Chest(with cough ) Pain Orientation: Left Pain Descriptors / Indicators: Aching Pain Onset: In the past 7 days Pain Frequency: Intermittent     Nutritional Status: BMI of 19-24  Normal Nutritional Risks: None Diabetes: Yes CBG done?: No Did pt. bring in CBG monitor from home?: No  How often do you need to have someone help you when you read instructions, pamphlets, or other written materials from your doctor or pharmacy?: 1 - Never What is the last grade level you completed in school?: 9th grade  Nutrition Risk Assessment:  Has the patient had any N/V/D  within the last 2 months?  No  Does the patient have any non-healing wounds?  No  Has the patient had any unintentional weight loss or weight gain?  No   Diabetes:  Is the patient diabetic?  Yes  If diabetic, was a CBG obtained today?  No  Did the patient bring in their glucometer from home?  No  How often do you monitor your CBG's? Daily .   Financial Strains and Diabetes Management:  Are you having any financial strains with the device, your supplies or your medication? No .  Does the patient want to be seen by Chronic Care Management for management of their diabetes?  No  Would the patient like to be referred to a Nutritionist or for Diabetic Management?  No   Diabetic Exams:  Diabetic Eye Exam: Completed 11/30/2017.  Diabetic Foot Exam: Completed 09/25/2017.   Interpreter Needed?: Yes Patient Declined Interpreter : Yes Interpretation services provided by: Family member interpreted visit Patient signed [place] waiver: Yes  Comments: Daughter KRudi Rummage Information entered by :: TJohnnette Litter  Past Medical History:  Diagnosis Date  . Chronic cough 01/16/2017  . [place]   . External hemorrhoid   . 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 History of TB (tuberculosis)   . Hypertension   . Lower GI bleed    Past Surgical History:  Procedure Laterality Date  .  COLONOSCOPY WITH PROPOFOL N/A 04/10/2017   Procedure: COLONOSCOPY WITH PROPOFOL;  Surgeon: [name], MD;  Location[facility] ENDOSCOPY;  Service: Gastroenterology;  Laterality: N/A;  . NO PAST SURGERIES     Family History  Problem Relation Age of Onset  . COPD Neg Hx   . Diabetes Mellitus II Neg Hx   . Hypertension Neg Hx    Social History   Socioeconomic History  . Marital status: Divorced    Spouse name: Not on file  . Number of children: Not on file  . Years of education: 53  . Highest education level: 9th  grade  Occupational History  . Occupation: retired  Scientific laboratory technician  . Financial resource strain: Not hard at all  . Food insecurity:    Worry: Never true    Inability: Never true  . Transportation needs:    Medical: No    Non-medical: No  Tobacco Use  . Smoking status: Former Smoker    Last attempt to quit: 01/11/2007    Years since quitting: 11.4  . Smokeless tobacco: Never Used  Substance and Sexual Activity  . Alcohol use: No  . Drug use: No  . Sexual activity: Yes    Birth control/protection: None  Lifestyle  . Physical activity:    Days per week: 0 days    Minutes per session: 0 min  . Stress: Not at all  Relationships  . Social connections:    Talks on phone: More than three times a week    Gets together: More than three times a week    Attends religious service: Never    Active member of club or organization: No    Attends meetings of clubs or organizations: Never    Relationship status: Divorced  Other Topics Concern  . Not on file  Social History Narrative  . Not on file    Outpatient Encounter Medications as of 06/05/2018  Medication Sig  . ACCU-CHEK AVIVA PLUS test strip USE TO TEST ONCE DAILY  . ACCU-CHEK SOFTCLIX LANCETS lancets USE TO TEST ONCE DAILY  . albuterol (PROVENTIL HFA;VENTOLIN HFA) 108 (90 Base) MCG/ACT inhaler Inhale 2 puffs into the lungs every 6 (six) hours as needed for wheezing or shortness of breath.  Marland Kitchen aspirin 81 MG chewable tablet Chew by mouth daily.  Marland Kitchen atorvastatin (LIPITOR) 20 MG tablet Take 1 tablet (20 mg total) by mouth daily.  . baclofen (LIORESAL) 10 MG tablet TAKE 1 TABLET BY MOUTH THREE TIMES A DAY  . blood glucose meter kit and supplies Dispense based on patient and insurance preference. Use one time daily as directed. (FOR ICD-10 E10.9, E11.9).  . hydrochlorothiazide (HYDRODIURIL) 12.5 MG tablet Take 1 tablet (12.5 mg total) by mouth daily.  Marland Kitchen losartan (COZAAR) 50 MG tablet TAKE 1 TABLET BY MOUTH EVERY DAY  . meclizine  (ANTIVERT) 12.5 MG tablet Take 1 tablet (12.5 mg total) by mouth 3 (three) times daily as needed for dizziness.  . metoprolol tartrate (LOPRESSOR) 50 MG tablet TAKE 1 TABLET BY MOUTH TWICE A DAY  . oxybutynin (DITROPAN-XL) 5 MG 24 hr tablet Take 1 tablet (5 mg total) by mouth daily.  Marland Kitchen Spacer/Aero-Hold Chamber Bags MISC 1 Device by Does not apply route every 6 (six) hours as needed (with inhaler).  . tamsulosin (FLOMAX) 0.4 MG CAPS capsule Take 0.8 mg by mouth daily.  Marland Kitchen umeclidinium-vilanterol (ANORO ELLIPTA) 62.5-25 MCG/INH AEPB Inhale 1 puff into the lungs daily.   No facility-administered encounter medications on file as of 06/05/2018.  Activities of Daily Living In your present state of health, do you have any difficulty performing the following activities: 06/05/2018  Hearing? N  Vision? N  Difficulty concentrating or making decisions? N  Walking or climbing Willis? N  Dressing or bathing? N  Doing errands, shopping? Y  Comment daughter assists   Conservation officer, nature and eating ? N  Using the Toilet? N  In the past six months, have you accidently leaked urine? N  Do you have problems with loss of bowel control? N  Managing your Medications? N  Managing your Finances? N  Housekeeping or managing your Housekeeping? N  Some recent data might be hidden    Patient Care Team: Mikey College, NP as PCP - General (Nurse Practitioner)   Assessment:   This is a routine wellness examination for [place].  Exercise Activities and Dietary recommendations Current Exercise Habits: Home exercise routine, Type of exercise: strength training/weights;walking, Time (Minutes): 15, Frequency (Times/Week): 4, Weekly Exercise (Minutes/Week): 60, Intensity: Mild, Exercise limited by: respiratory conditions(s)  Goals    . Increase water intake     Recommend drinking at least 5-6 glasses of water a day        Fall Risk Fall Risk  06/05/2018 02/07/2017 01/10/2017  Falls in the past year? 0 No No     FALL RISK PREVENTION PERTAINING TO THE HOME:  Any Willis in or around the home? Yes  If so, are there any without handrails? No   Home free of loose throw rugs in walkways, pet beds, electrical cords, etc? Yes  Adequate lighting in your home to reduce risk of falls? Yes   ASSISTIVE DEVICES UTILIZED TO PREVENT FALLS:  Life alert? No  Use of a cane, walker or w/c? No  Grab bars in the bathroom? No  Shower chair or bench in shower? No  Elevated toilet seat or a handicapped toilet? No   DME ORDERS:  DME order needed?  No   TIMED UP AND GO:  Was the test performed? Yes .  Length of time to ambulate 10 feet: 11 sec.   GAIT:  Appearance of gait: Gait stead-fast without the use of an assistive device.  Education: Fall risk prevention has been discussed.  Intervention(s) required? No    Depression Screen PHQ 2/9 Scores 06/05/2018 02/07/2017 01/10/2017  PHQ - 2 Score 0 0 0    Cognitive Function        Immunization History  Administered Date(s) Administered  . Hepatitis B 09/18/1996, 03/17/1997  . Influenza, High Dose Seasonal PF 01/10/2017, 12/05/2017  . Pneumococcal Conjugate-13 01/10/2017  . Pneumococcal Polysaccharide-23 04/03/2018  . Td 08/07/1995, 10/07/1995, 03/29/1996    Qualifies for Shingles Vaccine? No     Tdap: Although this vaccine is not a covered service during a Wellness Exam, does the patient still wish to receive this vaccine today?  No .  Education has been provided regarding the importance of this vaccine. Advised may receive this vaccine at local pharmacy or Health Dept. Aware to provide a copy of the vaccination record if obtained from local pharmacy or Health Dept. Verbalized acceptance and understanding.  Flu Vaccine: up to date   Pneumococcal Vaccine: up to date   Screening Tests Health Maintenance  Topic Date Due  . TETANUS/TDAP  03/29/2006  . FOOT EXAM  09/26/2018  . HEMOGLOBIN A1C  10/02/2018  . OPHTHALMOLOGY EXAM  12/01/2018  .  COLONOSCOPY  04/10/2020  . INFLUENZA VACCINE  Completed  . Hepatitis C Screening  Completed  .  PNA vac Low Risk Adult  Completed   Cancer Screenings:  Colorectal Screening: Completed 04/10/2017 . Repeat every  3 years  Lung Cancer Screening: (Low Dose CT Chest recommended if Age 28-80 years, 30 pack-year currently smoking OR have quit w/in 15years.) does qualify.   Lung Cancer Screening Referral: An Epic message has been sent to Burgess Estelle, RN (Oncology Nurse Navigator) regarding the possible need for this exam. Raquel Sarna will review the patient's chart to determine if the patient truly qualifies for the exam. If the patient qualifies, Raquel Sarna will order the Low Dose CT of the chest to facilitate the scheduling of this exam.  Additional Screening:  Hepatitis C Screening: does qualify; Completed 02/15/2017  Dental Screening: Recommended annual dental exams for proper oral hygiene  Community Resource Referral:  CRR required this visit?  No        Plan:  I have personally reviewed and addressed the Medicare Annual Wellness questionnaire and have noted the following in the patient's chart:  A. Medical and social history B. Use of alcohol, tobacco or illicit drugs  C. Current medications and supplements D. Functional ability and status E.  Nutritional status F.  Physical activity G. Advance directives H. List of other physicians I.  Hospitalizations, surgeries, and ER visits in previous 12 months J.  [place] such as hearing and vision if needed, cognitive and depression L. Referrals and appointments   In addition, I have reviewed and discussed with patient certain preventive protocols, quality metrics, and best practice recommendations. A written personalized care plan for preventive services as well as general preventive health recommendations were provided to patient.   Signed,   Bevelyn Ngo, LPN  08/26/5613 Nurse Health Advisor  Nurse Notes: cough with chest  pain, 7/10 pain today. States gotten worse in the last 7 days. Is not taking anything except using his inhalers as needed.  Patient denies traveling in the last 30 days or being around anyone who has traveled in the last 30 days. Daughter informed front desk staff that her sister just came back from [place] 15 days ago. Patient will see Lissa Merlin today for further evaluation of cough.

## 2018-06-05 NOTE — Progress Notes (Signed)
Subjective:    Patient ID: Jonathan Willis, male    DOB: 1945-12-10, 73 y.o.   MRN: [id number]  Jonathan Willis is a 73 y.o. male presenting on 06/05/2018 for Cough (w/ COPD)  HPI COPD exacerbation Patient reports new, worsening chest pain/cough over last 7 days.  Has had no recent travel, no fever, no sick contacts with known Covid-19 or exposure to Covid-19.  Patient's daughter traveled back from [facility] 15 days ago.  - Patient is currently using both Incruse and Anoro.  He did not stop Incruse as instructed at last visit.  Continues using albuterol only as needed. - Patient also reports chest pain 7/10 today due to cough/ breathing problems. - Patient denies fever, chills, sweats, chest/upper respiratory congestion. - Symptoms resolved temporarily after last visit on 05/15/2018, but returned as soon as prednisone was completed.    Social History   Tobacco Use  . Smoking status: Former Smoker    Packs/day: 1.50    Years: 44.00    Pack years: 66.00    Types: Cigarettes    Last attempt to quit: 01/11/2007    Years since quitting: 11.4  . Smokeless tobacco: Never Used  Substance Use Topics  . Alcohol use: No  . Drug use: No    Review of Systems Per HPI unless specifically indicated above     Objective:    BP (!) 141/56 (BP Location: Right Arm, Patient Position: Sitting, Cuff Size: Normal)   Pulse 79   Temp 97.7 F (36.5 C) (Oral)   Resp 16   Ht 5' (1.524 m)   Wt 110 lb 9.6 oz (50.2 kg)   SpO2 100%   BMI 21.60 kg/m   Wt Readings from Last 3 Encounters:  06/05/18 110 lb 9.6 oz (50.2 kg)  06/05/18 110 lb 6.4 oz (50.1 kg)  05/15/18 110 lb 6.4 oz (50.1 kg)    Physical Exam Vitals signs reviewed.  Constitutional:      General: He is not in acute distress.    Appearance: He is well-developed.  HENT:     Head: Normocephalic and atraumatic.  Cardiovascular:     Rate and Rhythm: Normal rate and regular rhythm.     Pulses:          Radial pulses are 2+ on the right side and 2+ on the  left side.       Posterior tibial pulses are 1+ on the right side and 1+ on the left side.     Heart sounds: Normal heart sounds, S1 normal and S2 normal.  Pulmonary:     Effort: Pulmonary effort is normal. No respiratory distress.     Breath sounds: Normal breath sounds and air entry.     Comments: Severe inspiratory wheezing throughout.  Decreased air movement. Musculoskeletal:     Right lower leg: No edema.     Left lower leg: No edema.  Skin:    General: Skin is warm and dry.     Capillary Refill: Capillary refill takes less than 2 seconds.  Neurological:     Mental Status: He is alert and oriented to person, place, and time.  Psychiatric:        Attention and Perception: Attention normal.        Mood and Affect: Mood and affect normal.        Behavior: Behavior normal. Behavior is cooperative.    Results for orders placed or performed in visit on 04/04/18  Bladder Scan (Post Void Residual) in office  Result Value Ref Range   Scan Result 27mL       Assessment & Plan:   Problem List Items Addressed This Visit    None    Visit Diagnoses    Acute exacerbation of chronic obstructive pulmonary disease (COPD) ([place])    -  Primary   Relevant Medications   predniSONE (DELTASONE) 50 MG tablet   azithromycin (ZITHROMAX) 250 MG tablet    Consistent with mild acute exacerbation of COPD with worsening productive cough. Similar to prior exacerbations, last month, but mildly worsening. - No hypoxia (100% on RA), afebrile, no recent hospitalization - Continues Albuterol, Anoro  Plan: 1. Start Prednisone 50mg  x 5 day steroid burst 2. Use albuterol q 4 hr regularly x 2-3 days. Continue maintenance inhalers 3. Considered antibiotics - START azithromycin 250 mg tablet. Take 2 tablets today.  Then, take 1 tablet daily for the next 4 days and stop. 4. RTC about 1 week if not improving, otherwise strict return criteria to go to ED    Meds ordered this encounter  Medications  .  predniSONE (DELTASONE) 50 MG tablet    Sig: Take 1 tablet (50 mg total) by mouth daily with breakfast for 5 days.    Dispense:  5 tablet    Refill:  0    Order Specific Question:   Supervising Provider    Answer:   Olin Hauser [[id number]]  . azithromycin (ZITHROMAX) 250 MG tablet    Sig: Take one tablet twice today.  Then, take 1 tablet daily for 4 days.    Dispense:  6 tablet    Refill:  0    Order Specific Question:   Supervising Provider    Answer:   Olin Hauser [[id number]]    Follow up plan: Return 7-10 if symptoms worsen or fail to improve.  [name], DNP, AGPCNP-BC Adult Gerontology Primary Care Nurse Practitioner [facility] 06/05/2018, 8:53 AM

## 2018-06-13 ENCOUNTER — Other Ambulatory Visit: Payer: Self-pay | Admitting: Nurse Practitioner

## 2018-06-13 DIAGNOSIS — I10 Essential (primary) hypertension: Secondary | ICD-10-CM

## 2018-06-18 ENCOUNTER — Telehealth: Payer: Self-pay | Admitting: *Deleted

## 2018-06-18 NOTE — Telephone Encounter (Signed)
Patient notified/message left to notify patient that due to current restrictions lung screening appointments are cancelled and patient will be contacted regarding rescheduling.  

## 2018-06-24 ENCOUNTER — Other Ambulatory Visit: Payer: Self-pay | Admitting: Nurse Practitioner

## 2018-06-24 DIAGNOSIS — I10 Essential (primary) hypertension: Secondary | ICD-10-CM

## 2018-06-25 MED ORDER — LOSARTAN POTASSIUM 50 MG PO TABS: 50.0000 mg | ORAL_TABLET | Freq: Every day | ORAL | 1 refills | Status: DC

## 2018-06-26 ENCOUNTER — Inpatient Hospital Stay: Payer: Medicare Other | Admitting: Oncology

## 2018-06-26 ENCOUNTER — Ambulatory Visit: Payer: Medicare Other

## 2018-07-05 ENCOUNTER — Encounter (INDEPENDENT_AMBULATORY_CARE_PROVIDER_SITE_OTHER): Payer: Self-pay

## 2018-07-05 ENCOUNTER — Ambulatory Visit (INDEPENDENT_AMBULATORY_CARE_PROVIDER_SITE_OTHER): Payer: Self-pay | Admitting: Nurse Practitioner

## 2018-08-28 ENCOUNTER — Other Ambulatory Visit: Payer: Self-pay | Admitting: Nurse Practitioner

## 2018-08-28 DIAGNOSIS — E119 Type 2 diabetes mellitus without complications: Secondary | ICD-10-CM

## 2018-09-04 ENCOUNTER — Encounter (INDEPENDENT_AMBULATORY_CARE_PROVIDER_SITE_OTHER): Payer: Medicare Other

## 2018-09-04 ENCOUNTER — Encounter (INDEPENDENT_AMBULATORY_CARE_PROVIDER_SITE_OTHER): Payer: Self-pay

## 2018-09-04 ENCOUNTER — Ambulatory Visit (INDEPENDENT_AMBULATORY_CARE_PROVIDER_SITE_OTHER): Payer: Medicare Other | Admitting: Nurse Practitioner

## 2018-09-06 ENCOUNTER — Other Ambulatory Visit: Payer: Self-pay

## 2018-09-25 ENCOUNTER — Other Ambulatory Visit: Payer: Self-pay | Admitting: Nurse Practitioner

## 2018-09-25 DIAGNOSIS — I10 Essential (primary) hypertension: Secondary | ICD-10-CM

## 2018-10-02 ENCOUNTER — Ambulatory Visit: Payer: Medicare Other | Admitting: Nurse Practitioner

## 2018-10-08 ENCOUNTER — Other Ambulatory Visit: Payer: Self-pay | Admitting: Nurse Practitioner

## 2018-10-08 DIAGNOSIS — E1169 Type 2 diabetes mellitus with other specified complication: Secondary | ICD-10-CM

## 2018-10-16 ENCOUNTER — Other Ambulatory Visit: Payer: Self-pay

## 2018-10-16 ENCOUNTER — Encounter: Payer: Self-pay | Admitting: Nurse Practitioner

## 2018-10-16 ENCOUNTER — Ambulatory Visit (INDEPENDENT_AMBULATORY_CARE_PROVIDER_SITE_OTHER): Payer: Medicare Other | Admitting: Nurse Practitioner

## 2018-10-16 VITALS — BP 112/55 | HR 79 | Resp 20 | Ht 60.0 in | Wt 110.6 lb

## 2018-10-16 DIAGNOSIS — E1169 Type 2 diabetes mellitus with other specified complication: Secondary | ICD-10-CM | POA: Diagnosis not present

## 2018-10-16 DIAGNOSIS — I10 Essential (primary) hypertension: Secondary | ICD-10-CM

## 2018-10-16 DIAGNOSIS — E785 Hyperlipidemia, unspecified: Secondary | ICD-10-CM | POA: Diagnosis not present

## 2018-10-16 DIAGNOSIS — R42 Dizziness and giddiness: Secondary | ICD-10-CM | POA: Diagnosis not present

## 2018-10-16 DIAGNOSIS — Z23 Encounter for immunization: Secondary | ICD-10-CM | POA: Diagnosis not present

## 2018-10-16 DIAGNOSIS — J431 Panlobular emphysema: Secondary | ICD-10-CM | POA: Diagnosis not present

## 2018-10-16 LAB — POCT GLYCOSYLATED HEMOGLOBIN (HGB A1C): Hemoglobin A1C: 6.1 % — AB (ref 4.0–5.6)

## 2018-10-16 MED ORDER — METOPROLOL TARTRATE 50 MG PO TABS: 50.0000 mg | ORAL_TABLET | Freq: Two times a day (BID) | ORAL | 1 refills | Status: DC

## 2018-10-16 MED ORDER — HYDROCHLOROTHIAZIDE 12.5 MG PO TABS: 12.5000 mg | ORAL_TABLET | Freq: Every day | ORAL | 1 refills | Status: DC

## 2018-10-16 MED ORDER — ACCU-CHEK SOFTCLIX LANCETS MISC: 3 refills | Status: DC

## 2018-10-16 MED ORDER — MECLIZINE HCL 12.5 MG PO TABS: 12.5000 mg | ORAL_TABLET | Freq: Three times a day (TID) | ORAL | 1 refills | Status: DC | PRN

## 2018-10-16 MED ORDER — ATORVASTATIN CALCIUM 20 MG PO TABS: 20.0000 mg | ORAL_TABLET | Freq: Every day | ORAL | 1 refills | Status: DC

## 2018-10-16 MED ORDER — ANORO ELLIPTA 62.5-25 MCG/INH IN AEPB: 1.0000 | INHALATION_SPRAY | Freq: Every day | RESPIRATORY_TRACT | 1 refills | Status: DC

## 2018-10-16 MED ORDER — TELMISARTAN 40 MG PO TABS: 40.0000 mg | ORAL_TABLET | Freq: Every day | ORAL | 1 refills | Status: DC

## 2018-10-16 MED ORDER — ACCU-CHEK AVIVA PLUS VI STRP: 1.0000 | ORAL_STRIP | Freq: Every day | 3 refills | Status: DC

## 2018-10-16 MED ORDER — ALBUTEROL SULFATE HFA 108 (90 BASE) MCG/ACT IN AERS: 2.0000 | INHALATION_SPRAY | Freq: Four times a day (QID) | RESPIRATORY_TRACT | 2 refills | Status: DC | PRN

## 2018-10-16 NOTE — Progress Notes (Signed)
Subjective:    Patient ID: Jonathan Willis, male    DOB: 05-20-1945, 73 y.o.   MRN: [id number]  Jonathan Willis is a 73 y.o. male presenting on 10/16/2018 for Diabetes and Hypertension  HPI Diabetes Pt presents today for follow up of Type 2 diabetes mellitus. He is checking fasting am CBG at home with a range of 110 today, usually under 120, lowest 97 - Current diabetic medications include: none - He is not currently symptomatic.  - He denies polydipsia, polyphagia, polyuria, headaches, diaphoresis, shakiness, chills, pain, numbness or tingling in extremities and changes in vision.   - Clinical course has been stable. - He  reports an exercise routine that includes walking, 5-7 days per week. - His diet is moderate in salt, moderate in fat, and low in carbohydrates. - Weight trend: stable  PREVENTION: Eye exam current (within one year): yes Foot exam current (within one year): no Lipid/ASCVD risk reduction - on statin: atorvastatin Kidney protection - on ace or arb: telmisartan Recent Labs    01/01/18 0845 04/03/18 0810  HGBA1C 6.2* 6.2*   Hypertension - He is not checking BP at home or outside of clinic.    - Current medications: telmisartan 80 mg once daily, hctz 12.5 mg once daily, metoprolol tartrate 50 mg twice daily, tolerating with side effects of dizziness regularly - He is not currently symptomatic. - Pt denies headache, changes in vision, chest tightness/pressure, palpitations, leg swelling, sudden loss of speech or loss of consciousness.  COPD Continues to have good breathing control at this time.   Patient is currently taking Anoro daily, albuterol as needed.  He uses albuterol only 1-2 times per month.  Social History   Tobacco Use   Smoking status: Former Smoker    Packs/day: 1.50    Years: 44.00    Pack years: 66.00    Types: Cigarettes    Quit date: 01/11/2007    Years since quitting: 11.7   Smokeless tobacco: Never Used  Substance Use Topics   Alcohol use: No     Drug use: No    Review of Systems Per HPI unless specifically indicated above     Objective:    BP (!) 112/55 (BP Location: Right Arm, Patient Position: Sitting, Cuff Size: Normal)    Pulse 79    Resp 20    Ht 5' (1.524 m)    Wt 110 lb 9.6 oz (50.2 kg)    SpO2 100%    BMI 21.60 kg/m   Wt Readings from Last 3 Encounters:  10/16/18 110 lb 9.6 oz (50.2 kg)  06/05/18 110 lb 9.6 oz (50.2 kg)  06/05/18 110 lb 6.4 oz (50.1 kg)    Physical Exam Vitals signs reviewed.  Constitutional:      General: He is awake. He is not in acute distress.    Appearance: He is well-developed.  HENT:     Head: Normocephalic and atraumatic.  Neck:     Musculoskeletal: Normal range of motion and neck supple.     Vascular: No carotid bruit.  Cardiovascular:     Rate and Rhythm: Normal rate and regular rhythm.     Pulses:          Radial pulses are 2+ on the right side and 2+ on the left side.       Posterior tibial pulses are 1+ on the right side and 1+ on the left side.     Heart sounds: Normal heart sounds, S1 normal and S2 normal.  Pulmonary:     Effort: Pulmonary effort is normal. No respiratory distress.     Breath sounds: Normal breath sounds and air entry.  Skin:    General: Skin is warm and dry.     Capillary Refill: Capillary refill takes less than 2 seconds.  Neurological:     General: No focal deficit present.     Mental Status: He is alert and oriented to person, place, and time. Mental status is at baseline.  Psychiatric:        Attention and Perception: Attention normal.        Mood and Affect: Mood and affect normal.        Behavior: Behavior normal. Behavior is cooperative.        Thought Content: Thought content normal.        Judgment: Judgment normal.    Diabetic Foot Exam - Simple   Simple Foot Form Diabetic Foot exam was performed with the following findings: Yes 10/16/2018  8:40 AM  Visual Inspection No deformities, no ulcerations, no other skin breakdown bilaterally:  Yes Sensation Testing Intact to touch and monofilament testing bilaterally: Yes Pulse Check Posterior Tibialis and Dorsalis pulse intact bilaterally: Yes Comments      Results for orders placed or performed in visit on 04/04/18  Bladder Scan (Post Void Residual) in office  Result Value Ref Range   Scan Result 71mL       Assessment & Plan:   Problem List Items Addressed This Visit      Cardiovascular and Mediastinum   Essential hypertension Controlled hypertension.  BP goal < 130/80.  Pt is continuing lifestyle modifications.  Taking medications tolerating well without side effects. Likely positional dizziness caused by orthostatic hypotension.  Plan: 1. Continue taking hctz and metoprolol no changes. - REDUCE telmisartan to 40 mg once daily 2. Obtain labs today  3. Encouraged heart healthy diet and increasing exercise to 30 minutes most days of the week. 4. Check BP 1-2 x per week at home, keep log, and bring to clinic at next appointment. 5. Follow up 6 months or sooner if dizziness continues.   Relevant Medications   telmisartan (MICARDIS) 40 MG tablet   atorvastatin (LIPITOR) 20 MG tablet   hydrochlorothiazide (HYDRODIURIL) 12.5 MG tablet   metoprolol tartrate (LOPRESSOR) 50 MG tablet   Other Relevant Orders   COMPLETE METABOLIC PANEL WITH GFR     Respiratory   COPD (chronic obstructive pulmonary disease) (HCC) Stable today on exam.  Medications tolerated without side effects.  Continue at current doses.  Refills provided.  Consider repeat PFT at next visit for status evaluation. Followup 6 months.    Relevant Medications   albuterol (VENTOLIN HFA) 108 (90 Base) MCG/ACT inhaler   umeclidinium-vilanterol (ANORO ELLIPTA) 62.5-25 MCG/INH AEPB     Endocrine   Type 2 diabetes mellitus with other specified complication ([place]) - Primary Well-controlled DM with A1c stable from last visit and goal A1c < 7.0%. Currently controlled by diet and lifestyle. - Known complication  of dyslipidemia.  Plan:  1. Continue current therapy: lifestyle 2. Encourage improved lifestyle: - low carb/low glycemic diet reinforced prior education - Increase physical activity to 30 minutes most days of the week.  Explained that increased physical activity increases body's use of sugar for energy. 3. Check fasting am CBG and bring log to next visit for review 4. Continue ASA, ARB and Statin 5. DM Foot exam done today with normal findings.    6. Follow-up 6 months.  Relevant Medications   telmisartan (MICARDIS) 40 MG tablet   glucose blood (ACCU-CHEK AVIVA PLUS) test strip   Accu-Chek Softclix Lancets lancets   atorvastatin (LIPITOR) 20 MG tablet   Other Relevant Orders   POCT glycosylated hemoglobin (Hb A1C) (Completed)   COMPLETE METABOLIC PANEL WITH GFR   Lipid panel   Dyslipidemia associated with type 2 diabetes mellitus (HCC)   Relevant Medications   telmisartan (MICARDIS) 40 MG tablet   atorvastatin (LIPITOR) 20 MG tablet   Other Relevant Orders   Lipid panel     Other   Vertigo Likely stable to resolved.  Suspect orthostatic component with BP control currently lower normal.   Relevant Medications   meclizine (ANTIVERT) 12.5 MG tablet    Other Visit Diagnoses    Need for tetanus, diphtheria, and acellular pertussis (Tdap) vaccine     Encouraged patient to get TDAP at local pharmacy.  Provided VIS so they can request the vaccine.      Meds ordered this encounter  Medications   telmisartan (MICARDIS) 40 MG tablet    Sig: Take 1 tablet (40 mg total) by mouth daily.    Dispense:  90 tablet    Refill:  1    Order Specific Question:   Supervising Provider    Answer:   Olin Hauser [[id number]]    Follow up plan: Return in about 6 months (around 04/18/2019) for diabetes, hypertension, COPD.  [name], DNP, AGPCNP-BC Adult Gerontology Primary Care Nurse Practitioner [facility] 10/16/2018, 9:08 AM

## 2018-10-16 NOTE — Patient Instructions (Addendum)
Jonathan Willis,   Thank you for coming in to clinic today.  1. STOP telmisartan 80 mg once daily.  CHANGE to telmisartan 40 mg once daily.  This should help with dizziness.  If you stay dizzy, call clinic and we may reduce this again.  2. Continue good eating and regular exercise.  It is helping your blood pressure and blood sugar.  3. Continue inhalers without changes today as well. Your COPD is well controlled.  Please schedule a follow-up appointment with [name], AGNP. Return in about 6 months (around 04/18/2019) for diabetes, hypertension, COPD.  If you have any other questions or concerns, please feel free to call the clinic or send a message through [facility]. You may also schedule an earlier appointment if necessary.  You will receive a survey after today's visit either digitally by e-mail or paper by [employer]. Your experiences and feedback matter to [place].  Please respond so we know how we are doing as we provide care for you.  [name], DNP, AGNP-BC Adult Gerontology Nurse Practitioner [place] [facility]

## 2018-10-17 LAB — COMPLETE METABOLIC PANEL WITH GFR
AG Ratio: 1.5 (calc) (ref 1.0–2.5)
ALT: 23 U/L (ref 9–46)
AST: 23 U/L (ref 10–35)
Albumin: 4.5 g/dL (ref 3.6–5.1)
Alkaline phosphatase (APISO): 51 U/L (ref 35–144)
BUN/Creatinine Ratio: 31 (calc) — ABNORMAL HIGH (ref 6–22)
BUN: 31 mg/dL — ABNORMAL HIGH (ref 7–25)
CO2: 29 mmol/L (ref 20–32)
Calcium: 10.2 mg/dL (ref 8.6–10.3)
Chloride: 100 mmol/L (ref 98–110)
Creat: 1.01 mg/dL (ref 0.70–1.18)
GFR, Est African American: 85 mL/min/{1.73_m2} (ref >=60)
GFR, Est Non African American: 73 mL/min/{1.73_m2} (ref >=60)
Globulin: 3.1 g/dL (calc) (ref 1.9–3.7)
Glucose, Bld: 106 mg/dL — ABNORMAL HIGH (ref 65–99)
Potassium: 4.3 mmol/L (ref 3.5–5.3)
Sodium: 138 mmol/L (ref 135–146)
Total Bilirubin: 0.4 mg/dL (ref 0.2–1.2)
Total Protein: 7.6 g/dL (ref 6.1–8.1)

## 2018-10-17 LAB — LIPID PANEL
Cholesterol: 179 mg/dL (ref <200)
HDL: 49 mg/dL (ref >=40)
LDL Cholesterol (Calc): 98 mg/dL (calc)
Non-HDL Cholesterol (Calc): 130 mg/dL (calc) — ABNORMAL HIGH (ref <130)
Total CHOL/HDL Ratio: 3.7 (calc) (ref <5.0)
Triglycerides: 208 mg/dL — ABNORMAL HIGH (ref <150)

## 2018-10-25 ENCOUNTER — Other Ambulatory Visit: Payer: Self-pay | Admitting: Nurse Practitioner

## 2018-10-25 DIAGNOSIS — R350 Frequency of micturition: Secondary | ICD-10-CM

## 2018-10-25 DIAGNOSIS — I10 Essential (primary) hypertension: Secondary | ICD-10-CM

## 2018-10-25 MED ORDER — TELMISARTAN 40 MG PO TABS: 40.0000 mg | ORAL_TABLET | Freq: Every day | ORAL | 1 refills | Status: DC

## 2018-10-29 ENCOUNTER — Other Ambulatory Visit: Payer: Self-pay | Admitting: Nurse Practitioner

## 2018-10-29 DIAGNOSIS — J431 Panlobular emphysema: Secondary | ICD-10-CM

## 2018-10-29 MED ORDER — ANORO ELLIPTA 62.5-25 MCG/INH IN AEPB: 1.0000 | INHALATION_SPRAY | Freq: Every day | RESPIRATORY_TRACT | 1 refills | Status: DC

## 2018-11-21 ENCOUNTER — Other Ambulatory Visit: Payer: Self-pay | Admitting: Nurse Practitioner

## 2018-11-21 DIAGNOSIS — J431 Panlobular emphysema: Secondary | ICD-10-CM

## 2018-11-21 MED ORDER — ANORO ELLIPTA 62.5-25 MCG/INH IN AEPB: 1.0000 | INHALATION_SPRAY | Freq: Every day | RESPIRATORY_TRACT | 1 refills | Status: DC

## 2018-11-23 ENCOUNTER — Other Ambulatory Visit: Payer: Self-pay

## 2018-11-23 ENCOUNTER — Encounter: Payer: Self-pay | Admitting: Nurse Practitioner

## 2018-11-23 ENCOUNTER — Ambulatory Visit (INDEPENDENT_AMBULATORY_CARE_PROVIDER_SITE_OTHER): Payer: Medicare Other | Admitting: Nurse Practitioner

## 2018-11-23 DIAGNOSIS — J441 Chronic obstructive pulmonary disease with (acute) exacerbation: Secondary | ICD-10-CM

## 2018-11-23 MED ORDER — PREDNISONE 50 MG PO TABS: 50.0000 mg | ORAL_TABLET | Freq: Every day | ORAL | 0 refills | Status: AC

## 2018-11-23 MED ORDER — TRELEGY ELLIPTA 100-62.5-25 MCG/INH IN AEPB: 1.0000 | INHALATION_SPRAY | Freq: Every day | RESPIRATORY_TRACT | 5 refills | Status: DC

## 2018-11-23 NOTE — Progress Notes (Signed)
Telemedicine Encounter: Disclosed to patient at start of encounter that we will provide appropriate telemedicine services.  Patient consents to be treated via phone prior to discussion.  Limitations of telemedicine evaluation were discussed with patient. - Patient is at his home and is accessed via telephone. - Services are provided by [name] from [facility].  Subjective:    Patient ID: Jonathan Willis, male    DOB: [birth year], 73 y.o.   MRN: [id number]  Jonathan Willis is a 73 y.o. male presenting on 11/23/2018 for Cough (COPD, persistent left side chest tightiness pain that worsen with coughing and wheezing x 6 days )  HPI [employer] were used via telephone Language: [place]  Name: Jonathan Willis        [id number]  Chest pain Onset about 6 days ago.  Chest tightness is described as "pain in heart" and is occurring on the left side more than anywhere else.  More strong intensity pain is occurring only when coughing.  If he is not coughing pain is less. Patient endorses small level of pain all the time.   - Patient has dry cough, but only occasionally some mild congestion.    COPD: - Patient is prescribed Anoro once daily. - Albuterol prn - currently using when he feels tired with breathing, he uses this.  Is taking twice daily over last 6 days.  Had also been using regularly twice daily before the cough.  When he is coughing he uses it and it helps for several hours.  Also helps to reduce pain intensity when taking albuterol.  Social History   Tobacco Use  . Smoking status: Former Smoker    Packs/day: 1.50    Years: 44.00    Pack years: 66.00    Types: Cigarettes    Quit date: 01/11/2007    Years since quitting: 11.8  . Smokeless tobacco: Never Used  Substance Use Topics  . Alcohol use: No  . Drug use: No   Review of Systems Per HPI unless specifically indicated above    Objective:    There were no vitals taken for this visit.  Wt Readings from Last 3  Encounters:  10/16/18 110 lb 9.6 oz (50.2 kg)  06/05/18 110 lb 9.6 oz (50.2 kg)  06/05/18 110 lb 6.4 oz (50.1 kg)    Physical Exam Patient remotely monitored.  Verbal communication appropriate with the interpreter.  Cognition normal.   Results for orders placed or performed in visit on 10/16/18  COMPLETE METABOLIC PANEL WITH GFR  Result Value Ref Range   Glucose, Bld 106 (H) 65 - 99 mg/dL   BUN 31 (H) 7 - 25 mg/dL   Creat 1.01 0.70 - 1.18 mg/dL   GFR, Est Non African American 73 > OR = 60 mL/min/1.61m2   GFR, Est African American 85 > OR = 60 mL/min/1.68m2   BUN/Creatinine Ratio 31 (H) 6 - 22 (calc)   Sodium 138 135 - 146 mmol/L   Potassium 4.3 3.5 - 5.3 mmol/L   Chloride 100 98 - 110 mmol/L   CO2 29 20 - 32 mmol/L   Calcium 10.2 8.6 - 10.3 mg/dL   Total Protein 7.6 6.1 - 8.1 g/dL   Albumin 4.5 3.6 - 5.1 g/dL   Globulin 3.1 1.9 - 3.7 g/dL (calc)   AG Ratio 1.5 1.0 - 2.5 (calc)   Total Bilirubin 0.4 0.2 - 1.2 mg/dL   Alkaline phosphatase (APISO) 51 35 - 144 U/L   AST 23 10 - 35  U/L   ALT 23 9 - 46 U/L  Lipid panel  Result Value Ref Range   Cholesterol 179 <200 mg/dL   HDL 49 > OR = 40 mg/dL   Triglycerides 208 (H) <150 mg/dL   LDL Cholesterol (Calc) 98 mg/dL (calc)   Total CHOL/HDL Ratio 3.7 <5.0 (calc)   Non-HDL Cholesterol (Calc) 130 (H) <130 mg/dL (calc)  POCT glycosylated hemoglobin (Hb A1C)  Result Value Ref Range   Hemoglobin A1C 6.1 (A) 4.0 - 5.6 %   HbA1c POC (<> result, manual entry)     HbA1c, POC (prediabetic range)     HbA1c, POC (controlled diabetic range)        Assessment & Plan:   Problem List Items Addressed This Visit    None    Visit Diagnoses    COPD exacerbation (HCC)    -  Primary   Relevant Medications   Fluticasone-Umeclidin-Vilant (TRELEGY ELLIPTA) 100-62.5-25 MCG/INH AEPB   predniSONE (DELTASONE) 50 MG tablet    Consistent with mild acute exacerbation of COPD with worsening congested cough. Similar to prior exacerbations, last summer.   Cannot fully exclude cardiac cause.  Evaluation is made difficult because of remote visit and use of interpreter.  Probable COPD exacerbation due to improved pain after use of albuterol and increase in pain with coughing/worsening shortness of breath and wheezing. - Continues Albuterol, Anoro  Plan: 1. Start Prednisone 50mg  x 5 day steroid burst 2. Use albuterol q 4 hr regularly x 2-3 days. Change maintenance inhaler. - STOP Anoro - START Trelegy 3. Considered antibiotics, however afebrile and no hypoxia, would not be indicated in mild AECOPD, unless recurrent or not improved on steroids 4. RTC about 1 week if not improving, otherwise strict return criteria to go to ED.  Reinforced need to go due to possible cardiac cause of pain if albuterol and medications are not continuing to improve symptoms/relieve pain.   Meds ordered this encounter  Medications  . Fluticasone-Umeclidin-Vilant (TRELEGY ELLIPTA) 100-62.5-25 MCG/INH AEPB    Sig: Inhale 1 puff into the lungs daily.    Dispense:  30 each    Refill:  5    Order Specific Question:   Supervising Provider    Answer:   Olin Hauser [[id number]]  . predniSONE (DELTASONE) 50 MG tablet    Sig: Take 1 tablet (50 mg total) by mouth daily with breakfast for 5 days.    Dispense:  5 tablet    Refill:  0    Order Specific Question:   Supervising Provider    Answer:   Olin Hauser [[id number]]   - Time spent in direct consultation with patient via telemedicine about above concerns: 14 minutes  Follow up plan: Follow-up 3-7 days if no improvement.    [name], DNP, AGPCNP-BC Adult Gerontology Primary Care Nurse Practitioner [facility] 11/23/2018, 8:12 AM

## 2018-11-27 ENCOUNTER — Other Ambulatory Visit: Payer: Self-pay | Admitting: Nurse Practitioner

## 2018-11-27 DIAGNOSIS — J431 Panlobular emphysema: Secondary | ICD-10-CM

## 2018-12-25 ENCOUNTER — Other Ambulatory Visit: Payer: Self-pay | Admitting: Family Medicine

## 2018-12-25 DIAGNOSIS — I10 Essential (primary) hypertension: Secondary | ICD-10-CM

## 2018-12-27 MED ORDER — TELMISARTAN 40 MG PO TABS: 40.0000 mg | ORAL_TABLET | Freq: Every day | ORAL | 0 refills | Status: DC

## 2019-02-18 ENCOUNTER — Telehealth: Payer: Self-pay | Admitting: Nurse Practitioner

## 2019-02-18 NOTE — Chronic Care Management (AMB) (Signed)
Chronic Care Management   Note  02/18/2019 Name: Jonathan Willis MRN: [id number] DOB: 10/03/1945  Jonathan Willis is a 73 y.o. year old male who is a primary care patient of Mikey College, NP (Inactive). I reached out to [name] by phone today in response to a referral sent by Jonathan Willis health plan.     Jonathan Willis was given information about Chronic Care Management services today including:  1. CCM service includes personalized support from designated clinical staff supervised by his physician, including individualized plan of care and coordination with other care providers 2. 24/7 contact phone numbers for assistance for urgent and routine care needs. 3. Service will only be billed when office clinical staff spend 20 minutes or more in a month to coordinate care. 4. Only one practitioner may furnish and bill the service in a calendar month. 5. The patient may stop CCM services at any time (effective at the end of the month) by phone call to the office staff. 6. The patient will be responsible for cost sharing (co-pay) of up to 20% of the service fee (after annual deductible is met).  Patients daughter Jonathan Willis agreed to services and verbal consent obtained.   Follow up plan: Telephone appointment with CCM team member scheduled for: 03/25/2019  [place], [place] [employer]  [place], [place] [address] Direct Dial: [email or web address]  Website: [email or web address]

## 2019-03-25 ENCOUNTER — Other Ambulatory Visit: Payer: Self-pay | Admitting: Nurse Practitioner

## 2019-03-25 ENCOUNTER — Ambulatory Visit: Payer: Medicare Other | Admitting: *Deleted

## 2019-03-25 DIAGNOSIS — E1169 Type 2 diabetes mellitus with other specified complication: Secondary | ICD-10-CM

## 2019-03-25 NOTE — Chronic Care Management (AMB) (Signed)
Chronic Care Management   Initial Visit Note  03/25/2019 Name: Jonathan Willis MRN: [id number] DOB: 01-Dec-1945  Referred by: Jonathan College, NP (Inactive) Reason for referral : Chronic Care Management ([place]ro to [place] )   Reached out to the daughter Jonathan Willis per phone. Jonathan Willis states they are not interested in CCM program at this time related to they in the process of finding a new PCP office which takes her insurance([employer]). Encouraged daughter to ask at new PCP office if CCM program available.   Jonathan Willis is a 73 y.o. year old male who is a primary care patient of Jonathan College, NP (Inactive). The CCM team was consulted for assistance with chronic disease management and care coordination needs related to HTN, COPD and DMII  Review of patient status, including review of consultants reports, relevant laboratory and other test results, and collaboration with appropriate care team members and the patient's provider was performed as part of comprehensive patient evaluation and provision of chronic care management services.    SDOH (Social Determinants of Health) screening performed today: None. See Care Plan for related entries.   Medications: Outpatient Encounter Medications as of 03/25/2019  Medication Sig  . Accu-Chek Softclix Lancets lancets USE TO TEST ONCE DAILY  . albuterol (VENTOLIN HFA) 108 (90 Base) MCG/ACT inhaler Inhale 2 puffs into the lungs every 6 (six) hours as needed for wheezing or shortness of breath.  Marland Kitchen aspirin 81 MG chewable tablet Chew by mouth daily.  Marland Kitchen atorvastatin (LIPITOR) 20 MG tablet Take 1 tablet (20 mg total) by mouth daily.  . baclofen (LIORESAL) 10 MG tablet TAKE 1 TABLET BY MOUTH THREE TIMES A DAY  . Fluticasone-Umeclidin-Vilant (TRELEGY ELLIPTA) 100-62.5-25 MCG/INH AEPB Inhale 1 puff into the lungs daily.  Marland Kitchen glucose blood (ACCU-CHEK AVIVA PLUS) test strip 1 each by Other route daily. Use as instructed  . hydrochlorothiazide (HYDRODIURIL) 12.5 MG  tablet Take 1 tablet (12.5 mg total) by mouth daily.  . meclizine (ANTIVERT) 12.5 MG tablet Take 1 tablet (12.5 mg total) by mouth 3 (three) times daily as needed for dizziness.  . metoprolol tartrate (LOPRESSOR) 50 MG tablet Take 1 tablet (50 mg total) by mouth 2 (two) times daily.  Marland Kitchen oxybutynin (DITROPAN-XL) 5 MG 24 hr tablet Take 1 tablet (5 mg total) by mouth daily.  Marland Kitchen Spacer/Aero-Hold Chamber Bags MISC 1 Device by Does not apply route every 6 (six) hours as needed (with inhaler).  . tamsulosin (FLOMAX) 0.4 MG CAPS capsule TAKE 2 CAPSULES BY MOUTH EVERY DAY  . telmisartan (MICARDIS) 40 MG tablet Take 1 tablet (40 mg total) by mouth daily.   No facility-administered encounter medications on file as of 03/25/2019.     Objective:    Mr. Skillman was given information about Chronic Care Management services today including:  1. CCM service includes personalized support from designated clinical staff supervised by his physician, including individualized plan of care and coordination with other care providers 2. 24/7 contact phone numbers for assistance for urgent and routine care needs. 3. Service will only be billed when office clinical staff spend 20 minutes or more in a month to coordinate care. 4. Only one practitioner may furnish and bill the service in a calendar month. 5. The patient may stop CCM services at any time (effective at the end of the month) by phone call to the office staff. 6. The patient will be responsible for cost sharing (co-pay) of up to 20% of the service fee (after annual deductible is  met).  Patient did not agree to enrollment in care management services and does not wish to consider at this time.  Plan:   No further follow up required: Patient changing PCP offices realted to insurance    Jonathan Sevcik RN, BSN Nurse Case Manager [facility]  [phone]) [employer]

## 2019-04-01 ENCOUNTER — Other Ambulatory Visit: Payer: Self-pay | Admitting: Nurse Practitioner

## 2019-04-01 DIAGNOSIS — I10 Essential (primary) hypertension: Secondary | ICD-10-CM

## 2019-04-11 ENCOUNTER — Other Ambulatory Visit: Payer: Self-pay

## 2019-04-11 ENCOUNTER — Other Ambulatory Visit: Payer: Self-pay | Admitting: Family Medicine

## 2019-04-11 ENCOUNTER — Ambulatory Visit (INDEPENDENT_AMBULATORY_CARE_PROVIDER_SITE_OTHER): Payer: Medicare Other | Admitting: Family Medicine

## 2019-04-11 ENCOUNTER — Encounter: Payer: Self-pay | Admitting: Family Medicine

## 2019-04-11 DIAGNOSIS — M542 Cervicalgia: Secondary | ICD-10-CM

## 2019-04-11 DIAGNOSIS — I10 Essential (primary) hypertension: Secondary | ICD-10-CM

## 2019-04-11 DIAGNOSIS — R350 Frequency of micturition: Secondary | ICD-10-CM

## 2019-04-11 DIAGNOSIS — J432 Centrilobular emphysema: Secondary | ICD-10-CM

## 2019-04-11 DIAGNOSIS — E785 Hyperlipidemia, unspecified: Secondary | ICD-10-CM | POA: Diagnosis not present

## 2019-04-11 DIAGNOSIS — J441 Chronic obstructive pulmonary disease with (acute) exacerbation: Secondary | ICD-10-CM

## 2019-04-11 DIAGNOSIS — R519 Headache, unspecified: Secondary | ICD-10-CM

## 2019-04-11 DIAGNOSIS — E1169 Type 2 diabetes mellitus with other specified complication: Secondary | ICD-10-CM | POA: Diagnosis not present

## 2019-04-11 MED ORDER — HYDROCHLOROTHIAZIDE 12.5 MG PO TABS: 12.5000 mg | ORAL_TABLET | Freq: Every day | ORAL | 1 refills | Status: DC

## 2019-04-11 MED ORDER — TELMISARTAN 40 MG PO TABS: 40.0000 mg | ORAL_TABLET | Freq: Every day | ORAL | 1 refills | Status: DC

## 2019-04-11 MED ORDER — TAMSULOSIN HCL 0.4 MG PO CAPS: 0.8000 mg | ORAL_CAPSULE | Freq: Every day | ORAL | 1 refills | Status: DC

## 2019-04-11 MED ORDER — OXYBUTYNIN CHLORIDE ER 5 MG PO TB24: 5.0000 mg | ORAL_TABLET | Freq: Every day | ORAL | 1 refills | Status: DC

## 2019-04-11 MED ORDER — METOPROLOL TARTRATE 50 MG PO TABS: 50.0000 mg | ORAL_TABLET | Freq: Two times a day (BID) | ORAL | 1 refills | Status: DC

## 2019-04-11 MED ORDER — BACLOFEN 10 MG PO TABS: 10.0000 mg | ORAL_TABLET | Freq: Three times a day (TID) | ORAL | 1 refills | Status: DC

## 2019-04-11 MED ORDER — PREDNISONE 50 MG PO TABS: 50.0000 mg | ORAL_TABLET | Freq: Every day | ORAL | 0 refills | Status: DC

## 2019-04-11 NOTE — Progress Notes (Signed)
Virtual Visit via Telephone The purpose of this virtual visit is to provide medical care while limiting exposure to the novel coronavirus (COVID19) for both patient and office staff.  Consent was obtained for phone visit:  Yes.   Answered questions that patient had about telehealth interaction:  Yes.   I discussed the limitations, risks, security and privacy concerns of performing an evaluation and management service by telephone. I also discussed with the patient that there may be a patient responsible charge related to this service. The patient expressed understanding and agreed to proceed.  Patient Location: Home Provider Location: [facility])  ---------------------------------------------------------------------- Chief Complaint  Patient presents with  . Medication Refill    Jonathan Willis ID [id number]  . Cough    chronic but due to weather has sore throat onset month and more productive cough, wheezing denies fever but bodyache   Previous PCP with [name], AGPCNP-BC   S: Reviewed CMA documentation. I have called patient and gathered additional HPI as follows:  Acute Exacerbation of COPD / Cough / Wheezing Chart review shows known past history of COPD with exacerbations in past. Last episode was 10/2018, treated Prednisone burst in past. He was changed from Anoro to Trelegy back in 10/2018. - He continues to use Trelegy, had been doing well. He uses Albuterol rescue inhaler now PRN with relief  Possible Statin induced Myalgia / Hyperlipidemia in DM2 Additional symptoms - generalized body ache, without fever. Reduced appetite. For past 6+ months. He does take Atorvastatin 20mg  daily. Has not reported side effect but he is not sure.  Denies any high risk travel to areas of current concern for COVID19. Denies any known or suspected exposure to person with or possibly with COVID19.  Denies any fevers, chills, sweats, sinus pain or pressure, headache, abdominal pain,  diarrhea  Past Medical History:  Diagnosis Date  . Chronic cough 01/16/2017  . Diabetes mellitus without complication ([place])   . External hemorrhoid   . 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 History of TB (tuberculosis)   . Hypertension   . Lower GI bleed    Social History   Tobacco Use  . Smoking status: Former Smoker    Packs/day: 1.50    Years: 44.00    Pack years: 66.00    Types: Cigarettes    Quit date: 01/11/2007    Years since quitting: 12.2  . Smokeless tobacco: Former Network engineer Use Topics  . Alcohol use: No  . Drug use: No    Current Outpatient Medications:  .  ACCU-CHEK AVIVA PLUS test strip, USE 1 TEST STRIP TO TEST BLOOD SUGAR DAILY, Disp: 50 strip, Rfl: 3 .  Accu-Chek Softclix Lancets lancets, USE TO TEST ONCE DAILY, Disp: 100 each, Rfl: 3 .  albuterol (VENTOLIN HFA) 108 (90 Base) MCG/ACT inhaler, Inhale 2 puffs into the lungs every 6 (six) hours as needed for wheezing or shortness of breath., Disp: 8 g, Rfl: 2 .  aspirin 81 MG chewable tablet, Chew by mouth daily., Disp: , Rfl:  .  atorvastatin (LIPITOR) 20 MG tablet, Take 1 tablet (20 mg total) by mouth daily., Disp: 90 tablet, Rfl: 1 .  Fluticasone-Umeclidin-Vilant (TRELEGY ELLIPTA) 100-62.5-25 MCG/INH AEPB, Inhale 1 puff into the lungs daily., Disp: 30 each, Rfl: 5 .  meclizine (ANTIVERT) 12.5 MG tablet, Take 1 tablet (12.5 mg total) by mouth 3 (three) times daily as needed for dizziness., Disp: 90 tablet, Rfl: 1 .  Spacer/Aero-Hold [employer], 1 Device by Does not apply route every 6 (six) hours as needed (with inhaler)., Disp: 1 each, Rfl: 1 .  baclofen (LIORESAL) 10 MG tablet, Take 1 tablet (10 mg total) by mouth 3 (three) times daily. As needed, Disp: 90 tablet, Rfl: 1 .  hydrochlorothiazide (HYDRODIURIL) 12.5 MG tablet, Take 1 tablet (12.5 mg total) by mouth daily., Disp: 90 tablet, Rfl: 1 .  metoprolol tartrate (LOPRESSOR) 50 MG tablet,  Take 1 tablet (50 mg total) by mouth 2 (two) times daily., Disp: 180 tablet, Rfl: 1 .  oxybutynin (DITROPAN-XL) 5 MG 24 hr tablet, Take 1 tablet (5 mg total) by mouth daily., Disp: 90 tablet, Rfl: 1 .  predniSONE (DELTASONE) 50 MG tablet, Take 1 tablet (50 mg total) by mouth daily with breakfast., Disp: 5 tablet, Rfl: 0 .  tamsulosin (FLOMAX) 0.4 MG CAPS capsule, Take 2 capsules (0.8 mg total) by mouth daily., Disp: 180 capsule, Rfl: 1 .  telmisartan (MICARDIS) 40 MG tablet, Take 1 tablet (40 mg total) by mouth daily., Disp: 90 tablet, Rfl: 1  Depression screen [facility] 2/9 04/11/2019 06/05/2018 02/07/2017  Decreased Interest 0 0 0  Down, Depressed, Hopeless 0 0 0  PHQ - 2 Score 0 0 0    No flowsheet data found.  -------------------------------------------------------------------------- O: No physical exam performed due to remote telephone encounter.  Lab results reviewed.  No results found for this or any previous visit (from the past 2160 hour(s)).  -------------------------------------------------------------------------- A&P:  Problem List Items Addressed This Visit    Dyslipidemia associated with type 2 diabetes mellitus ([place])   Centrilobular emphysema ([place])   Relevant Medications   predniSONE (DELTASONE) 50 MG tablet    Other Visit Diagnoses    COPD with acute exacerbation ([place])    -  Primary   Relevant Medications   predniSONE (DELTASONE) 50 MG tablet     Consistent with mild acute exacerbation of COPD with worsening productive cough. Similar to prior exacerbations, last 10/2018. Afebrile, no recent hospitalization ON maintenance therapy Trelegy  Plan: 1. Start Prednisone 50mg  x 5 day steroid burst 2. Use albuterol q 4 hr regularly x 2-3 days. Continue maintenance inhalers 3. Considered antibiotics, however afebrile and no hypoxia, would not be indicated in mild AECOPD, unless recurrent or not improved on steroids  Return in 1 week if not improving, otherwise strict return  criteria to go to ED  -----------  Hyperlipidemia, DM2 Previously had some mild elevated LDL still. On statin therapy atorvastatin 20mg  daily Suspect he may have side effect myalgia with some generalized body aches chronically without any other sick symptoms it seems, perhaps has not reported the side effect. - Offer a trial to HOLD Atorvastatin 20mg  daily for 2 weeks to see if this was causing the problem. - Consider switch to alternative statin in the future Rosuvastatin or lower dose or intermittent dosing regimen.   Meds ordered this encounter  Medications  . predniSONE (DELTASONE) 50 MG tablet    Sig: Take 1 tablet (50 mg total) by mouth daily with breakfast.    Dispense:  5 tablet    Refill:  0    Follow-up: - Return in 3 months for Diabetes A1c, Hyperlipidemia, HTN w/ new provider  Patient verbalizes understanding with the above medical recommendations including the limitation of remote medical advice.  Specific follow-up and call-back criteria were given for patient to follow-up or seek medical care more urgently if needed.   - Time spent in direct consultation with  patient on phone: 18 minutes   Nobie Putnam, [facility] 04/11/2019, 8:36 AM

## 2019-04-11 NOTE — Patient Instructions (Addendum)
Recommend that patient temporarily HOLD Atorvastatin 20mg  daily for next 2 weeks to see if it was causing any muscle aches or myalgia   Please schedule a Follow-up Appointment to: Return in about 3 months (around 07/10/2019) for Diabetes A1c, Hyperlipidemia, HTN w/ new provider.  If you have any other questions or concerns, please feel free to call the office or send a message through [place]. You may also schedule an earlier appointment if necessary.  Additionally, you may be receiving a survey about your experience at our office within a few days to 1 week by e-mail or mail. We value your feedback.  Nobie Putnam, DO [place]

## 2019-05-05 ENCOUNTER — Other Ambulatory Visit: Payer: Self-pay | Admitting: Family Medicine

## 2019-05-05 DIAGNOSIS — R519 Headache, unspecified: Secondary | ICD-10-CM

## 2019-05-05 DIAGNOSIS — M542 Cervicalgia: Secondary | ICD-10-CM

## 2019-05-28 ENCOUNTER — Other Ambulatory Visit: Payer: Self-pay | Admitting: Family Medicine

## 2019-05-28 DIAGNOSIS — M542 Cervicalgia: Secondary | ICD-10-CM

## 2019-05-28 DIAGNOSIS — R519 Headache, unspecified: Secondary | ICD-10-CM

## 2019-06-03 ENCOUNTER — Other Ambulatory Visit: Payer: Self-pay | Admitting: Nurse Practitioner

## 2019-06-03 DIAGNOSIS — E1169 Type 2 diabetes mellitus with other specified complication: Secondary | ICD-10-CM

## 2019-06-03 DIAGNOSIS — E785 Hyperlipidemia, unspecified: Secondary | ICD-10-CM

## 2019-06-06 ENCOUNTER — Encounter: Payer: Self-pay | Admitting: Emergency Medicine

## 2019-06-06 ENCOUNTER — Other Ambulatory Visit: Payer: Self-pay

## 2019-06-06 ENCOUNTER — Emergency Department: Payer: Medicare Other

## 2019-06-06 ENCOUNTER — Emergency Department
Admission: EM | Admit: 2019-06-06 | Discharge: 2019-06-06 | Disposition: A | Discharge status: Home / Self Care | Payer: Medicare Other | Attending: Emergency Medicine | Admitting: Emergency Medicine

## 2019-06-06 DIAGNOSIS — J449 Chronic obstructive pulmonary disease, unspecified: Secondary | ICD-10-CM | POA: Insufficient documentation

## 2019-06-06 DIAGNOSIS — R0602 Shortness of breath: Secondary | ICD-10-CM | POA: Diagnosis not present

## 2019-06-06 DIAGNOSIS — R05 Cough: Secondary | ICD-10-CM | POA: Diagnosis not present

## 2019-06-06 DIAGNOSIS — J441 Chronic obstructive pulmonary disease with (acute) exacerbation: Secondary | ICD-10-CM | POA: Diagnosis not present

## 2019-06-06 DIAGNOSIS — Z79899 Other long term (current) drug therapy: Secondary | ICD-10-CM | POA: Insufficient documentation

## 2019-06-06 DIAGNOSIS — Z87891 Personal history of nicotine dependence: Secondary | ICD-10-CM | POA: Diagnosis not present

## 2019-06-06 DIAGNOSIS — Z7982 Long term (current) use of aspirin: Secondary | ICD-10-CM | POA: Diagnosis not present

## 2019-06-06 DIAGNOSIS — R0600 Dyspnea, unspecified: Secondary | ICD-10-CM

## 2019-06-06 DIAGNOSIS — I10 Essential (primary) hypertension: Secondary | ICD-10-CM | POA: Diagnosis not present

## 2019-06-06 DIAGNOSIS — E119 Type 2 diabetes mellitus without complications: Secondary | ICD-10-CM | POA: Insufficient documentation

## 2019-06-06 DIAGNOSIS — Z20822 Contact with and (suspected) exposure to covid-19: Secondary | ICD-10-CM | POA: Diagnosis not present

## 2019-06-06 DIAGNOSIS — R079 Chest pain, unspecified: Secondary | ICD-10-CM | POA: Diagnosis not present

## 2019-06-06 LAB — CBC
HCT: 42.2 % (ref 39.0–52.0)
Hemoglobin: 14 g/dL (ref 13.0–17.0)
MCH: 30.9 pg (ref 26.0–34.0)
MCHC: 33.2 g/dL (ref 30.0–36.0)
MCV: 93.2 fL (ref 80.0–100.0)
Platelets: 250 10*3/uL (ref 150–400)
RBC: 4.53 MIL/uL (ref 4.22–5.81)
RDW: 12.5 % (ref 11.5–15.5)
WBC: 7 10*3/uL (ref 4.0–10.5)
nRBC: 0 % (ref 0.0–0.2)

## 2019-06-06 LAB — COMPREHENSIVE METABOLIC PANEL
ALT: 23 U/L (ref 0–44)
AST: 25 U/L (ref 15–41)
Albumin: 4.7 g/dL (ref 3.5–5.0)
Alkaline Phosphatase: 56 U/L (ref 38–126)
Anion gap: 7 (ref 5–15)
BUN: 22 mg/dL (ref 8–23)
CO2: 26 mmol/L (ref 22–32)
Calcium: 9.6 mg/dL (ref 8.9–10.3)
Chloride: 103 mmol/L (ref 98–111)
Creatinine, Ser: 0.94 mg/dL (ref 0.61–1.24)
GFR calc Af Amer: >60 mL/min (ref >60)
GFR calc non Af Amer: >60 mL/min (ref >60)
Glucose, Bld: 108 mg/dL — ABNORMAL HIGH (ref 70–99)
Potassium: 3.4 mmol/L — ABNORMAL LOW (ref 3.5–5.1)
Sodium: 136 mmol/L (ref 135–145)
Total Bilirubin: 0.6 mg/dL (ref 0.3–1.2)
Total Protein: 8.1 g/dL (ref 6.5–8.1)

## 2019-06-06 LAB — BRAIN NATRIURETIC PEPTIDE: B Natriuretic Peptide: 100 pg/mL (ref 0.0–100.0)

## 2019-06-06 LAB — POC SARS CORONAVIRUS 2 AG: SARS Coronavirus 2 Ag: NEGATIVE

## 2019-06-06 LAB — TROPONIN I (HIGH SENSITIVITY): Troponin I (High Sensitivity): 3 ng/L (ref <18)

## 2019-06-06 IMAGING — DX DG CHEST 1V PORT
1 series · 1 of 1 positions shown · non-contrast
Comparison: [DATE] and [DATE]

CLINICAL DATA: Cough and shortness of breath.  Chest pain.

EXAM:
PORTABLE CHEST 1 VIEW

[chest ap]
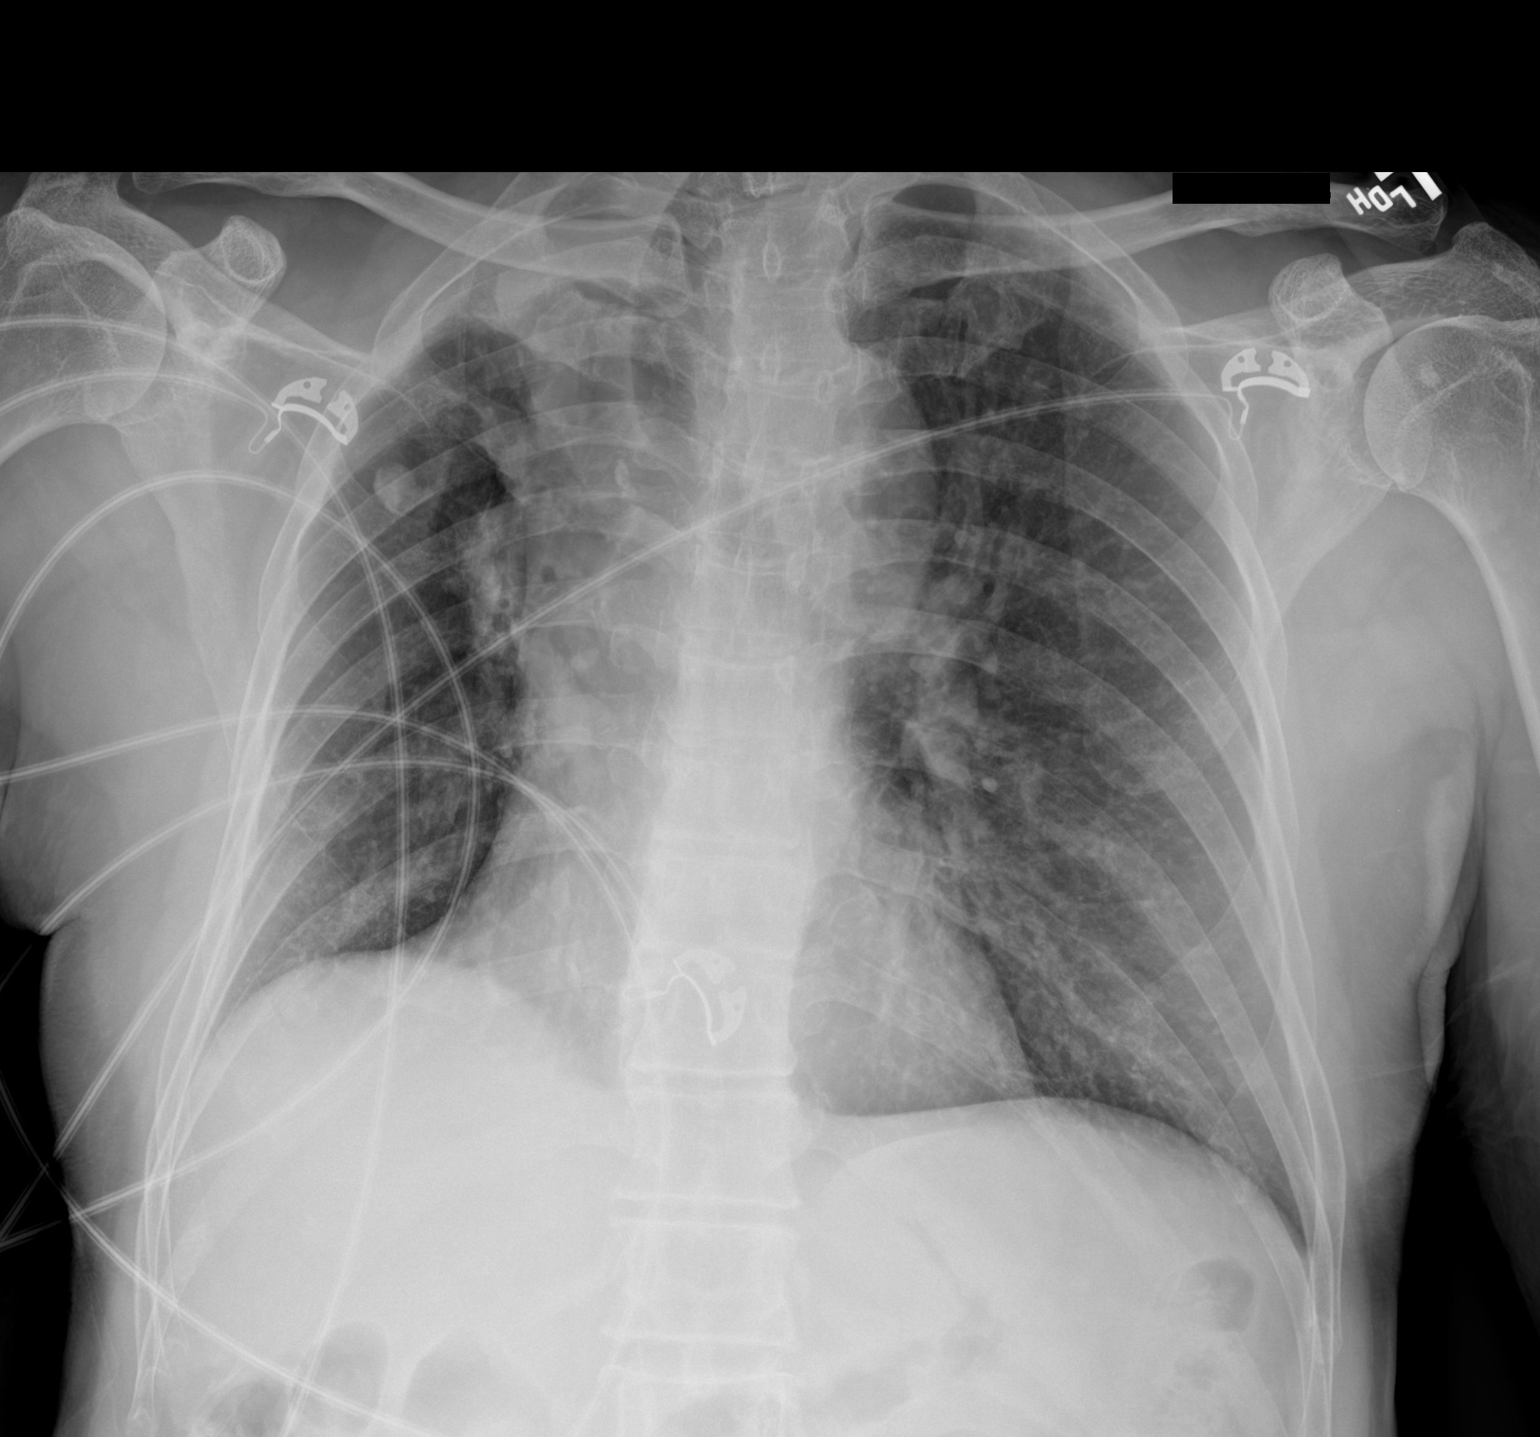

[1 of 1 positions shown; findings below may reference images not displayed]

FINDINGS: There is a calcified granuloma in the right upper lobe. No edema or
consolidation. Heart size and pulmonary vascular normal. Prominence
of the aortic arch region is stable. There is a degree of rightward
deviation of the upper thoracic trachea, stable. No bone lesions. No
pneumothorax.
IMPRESSION: Calcified granuloma right upper lobe. Lungs otherwise clear. Stable
cardiac silhouette. Prominence of the aortic arch region may be
indicative of chronic hypertension.

Rightward deviation of the upper thoracic trachea is stable and may
be indicative of thyroid enlargement in this region.

## 2019-06-06 MED ORDER — AZITHROMYCIN 500 MG PO TABS
500.0000 mg | ORAL_TABLET | Freq: Once | ORAL | Status: AC
  Administered 2019-06-06: 500 mg via ORAL
  Filled 2019-06-06: qty 1

## 2019-06-06 MED ORDER — METHYLPREDNISOLONE SODIUM SUCC 125 MG IJ SOLR
125.0000 mg | Freq: Once | INTRAMUSCULAR | Status: AC
  Administered 2019-06-06: 125 mg via INTRAVENOUS
  Filled 2019-06-06: qty 2

## 2019-06-06 MED ORDER — IPRATROPIUM-ALBUTEROL 0.5-2.5 (3) MG/3ML IN SOLN
3.0000 mL | Freq: Once | RESPIRATORY_TRACT | Status: AC
  Administered 2019-06-06: 3 mL via RESPIRATORY_TRACT
  Filled 2019-06-06: qty 6

## 2019-06-06 MED ORDER — PREDNISONE 10 MG PO TABS: 10.0000 mg | ORAL_TABLET | Freq: Every day | ORAL | 0 refills | Status: DC

## 2019-06-06 MED ORDER — AZITHROMYCIN 250 MG PO TABS: ORAL_TABLET | ORAL | 0 refills | Status: AC

## 2019-06-06 MED ORDER — IPRATROPIUM-ALBUTEROL 0.5-2.5 (3) MG/3ML IN SOLN
3.0000 mL | Freq: Once | RESPIRATORY_TRACT | Status: AC
  Administered 2019-06-06: 3 mL via RESPIRATORY_TRACT

## 2019-06-06 MED ORDER — GUAIFENESIN-CODEINE 100-10 MG/5ML PO SOLN: 5.0000 mL | Freq: Four times a day (QID) | ORAL | 0 refills | Status: DC | PRN

## 2019-06-06 NOTE — ED Notes (Signed)
Interpreted with Hulan Amato, interpreter [phone]. All questions about discharge paper work and new medications answered. Pt denies further needs.

## 2019-06-06 NOTE — ED Provider Notes (Signed)
[facility] Emergency Department Provider Note  [place] translator used for this evaluation  Time seen: 10:36 AM  I have reviewed the triage vital signs and the nursing notes.   HISTORY  Chief Complaint Shortness of Breath and Chest Pain   HPI Jonathan Willis is a 74 y.o. male with a past medical history of diabetes, hypertension, COPD, presents to the emergency department for shortness of breath and cough.  According to the patient for the past 1 week he has had progressive worsening shortness of breath and cough.  Patient states he takes all of his daily medications as prescribed by his doctor.  Has received his first corona vaccine 3 weeks ago.  Patient denies any fever at any point.  Describes shortness of breath much worse with exertion as well as cough.  States mild chest pressure but denies any "pain."  Denies abdominal pain.   Past Medical History:  Diagnosis Date  . Chronic cough 01/16/2017  . Diabetes mellitus without complication ([place])   . External hemorrhoid   . 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 History of TB (tuberculosis)   . Hypertension   . Lower GI bleed     Patient Active Problem List   Diagnosis Date Noted  . Vertigo 05/15/2018  . Urinary frequency 02/26/2018  . Nocturia 02/26/2018  . Occipital neuralgia of right side 09/05/2017  . Headache 07/01/2017  . Carotid stenosis 07/01/2017  . Benign prostatic hyperplasia with incomplete bladder emptying 03/23/2017  . Chronic right shoulder pain 01/16/2017  . Essential hypertension 01/16/2017  . Type 2 diabetes mellitus with other specified complication ([place]) 0000000  . Dyslipidemia associated with type 2 diabetes mellitus ([place]) 01/16/2017  . Centrilobular emphysema ([place]) 01/16/2017    Past Surgical History:  Procedure Laterality Date  . COLONOSCOPY WITH PROPOFOL N/A 04/10/2017   Procedure: COLONOSCOPY WITH PROPOFOL;  Surgeon: [name], MD;  Location: [facility] ENDOSCOPY;  Service: Gastroenterology;  Laterality: N/A;  . NO PAST SURGERIES      Prior to Admission medications   Medication Sig Start Date End Date Taking? Authorizing Provider  ACCU-CHEK AVIVA PLUS test strip USE 1 TEST STRIP TO TEST BLOOD SUGAR DAILY 03/25/19   Olin Hauser, DO  Accu-Chek Softclix Lancets lancets USE TO TEST ONCE DAILY 10/16/18   Mikey College, NP  albuterol (VENTOLIN HFA) 108 (90 Base) MCG/ACT inhaler Inhale 2 puffs into the lungs every 6 (six) hours as needed for wheezing or shortness of breath. 10/16/18   Mikey College, NP  aspirin 81 MG chewable tablet Chew by mouth daily.    [provider]  atorvastatin (LIPITOR) 20 MG tablet TAKE 1 TABLET BY MOUTH EVERY DAY 06/03/19   Malfi, Lupita Raider, FNP  baclofen (LIORESAL) 10 MG tablet TAKE 1 TABLET (10 MG TOTAL) BY MOUTH 3 (THREE) TIMES DAILY. AS NEEDED 05/28/19   Karamalegos, Devonne Doughty, DO  Fluticasone-Umeclidin-Vilant (TRELEGY ELLIPTA) 100-62.5-25 MCG/INH AEPB Inhale 1 puff into the lungs daily. 11/23/18   Mikey College, NP  hydrochlorothiazide (HYDRODIURIL) 12.5 MG tablet Take 1 tablet (12.5 mg total) by mouth daily. 04/11/19   Karamalegos, Devonne Doughty, DO  meclizine (ANTIVERT) 12.5 MG tablet Take 1 tablet (12.5 mg total) by mouth 3 (three) times daily as needed for dizziness. 10/16/18   Mikey College, NP  metoprolol tartrate (LOPRESSOR) 50 MG tablet Take 1 tablet (50 mg total) by mouth 2 (two) times daily. 04/11/19   Karamalegos,  Devonne Doughty, DO  oxybutynin (DITROPAN-XL) 5 MG 24 hr tablet Take 1 tablet (5 mg total) by mouth daily. 04/11/19   Karamalegos, Devonne Doughty, DO  predniSONE (DELTASONE) 50 MG tablet Take 1 tablet (50 mg total) by mouth daily with breakfast. 04/11/19   Olin Hauser, DO  Spacer/Aero-Hold Chamber Bags MISC 1 Device by Does not apply route every 6 (six) hours as needed (with inhaler). 01/10/17   Mikey College, NP   tamsulosin (FLOMAX) 0.4 MG CAPS capsule Take 2 capsules (0.8 mg total) by mouth daily. 04/11/19   Karamalegos, Devonne Doughty, DO  telmisartan (MICARDIS) 40 MG tablet Take 1 tablet (40 mg total) by mouth daily. 04/11/19   Karamalegos, Devonne Doughty, DO    No Known Allergies  Family History  Problem Relation Age of Onset  . COPD Neg Hx   . Diabetes Mellitus II Neg Hx   . Hypertension Neg Hx     Social History Social History   Tobacco Use  . Smoking status: Former Smoker    Packs/day: 1.50    Years: 44.00    Pack years: 66.00    Types: Cigarettes    Quit date: 01/11/2007    Years since quitting: 12.4  . Smokeless tobacco: Former Network engineer Use Topics  . Alcohol use: No  . Drug use: No    Review of Systems Constitutional: Negative for fever. Cardiovascular: Negative for chest pain.  Positive for intermittent chest pressure. Respiratory: Positive for shortness of breath.  Positive for cough. Gastrointestinal: Negative for abdominal pain Musculoskeletal: Negative for musculoskeletal complaints Neurological: Negative for headache All other ROS negative  ____________________________________________   PHYSICAL EXAM:  VITAL SIGNS: ED Triage Vitals  Enc Vitals Group     BP      Pulse      Resp      Temp      Temp src      SpO2      Weight      Height      Head Circumference      [place] Flow      Pain Score      Pain Loc      Pain Edu?      Excl. in [place]?    Constitutional: Alert. Well appearing and in no distress. Eyes: Normal exam ENT      Head: Normocephalic and atraumatic.      Mouth/Throat: Mucous membranes are moist. Cardiovascular: Normal rate, regular rhythm.  Respiratory: Mild tachypnea around 25 to 30 breaths/min.  Patient does have slight expiratory wheezes bilaterally.  No obvious rales or rhonchi. Gastrointestinal: Soft and nontender. No distention.  Musculoskeletal: Nontender with normal range of motion in all extremities. No lower extremity  tenderness or edema. Neurologic:  Normal speech and language. No gross focal neurologic deficits  Skin:  Skin is warm, dry and intact.  Psychiatric: Mood and affect are normal.   ____________________________________________    EKG  EKG viewed and interpreted by myself shows a normal sinus rhythm at 86 bpm with a narrow QRS, normal axis, normal intervals, no concerning ST changes  ____________________________________________    RADIOLOGY  X-ray shows calcified granuloma.  No acute abnormalities.  ____________________________________________   INITIAL IMPRESSION / ASSESSMENT AND PLAN / ED COURSE  Pertinent labs & imaging results that were available during my care of the patient were reviewed by me and considered in my medical decision making (see chart for details).   Patient presents to the emergency department for  chest pain and shortness of breath.  Differential would include COPD exacerbation, ACS, pneumonia, viral infection such as Covid, pulmonary edema/pleural effusion.  We will check labs, chest x-ray, treat with Solu-Medrol and obtain a rapid Covid swab.  If rapid Covid swab is negative we will treat with duo nebs in the emergency department.  Patient states he is feeling much better after medical treatment.  I offered to admit the patient to the hospital for COPD exacerbation as he continues to be mildly tachypneic around 20 to 25 breaths/min.  Patient states he does not want to be admitted to the hospital.  States he is feeling better.  I discussed going home with antibiotics as a precaution, steroids for the next 10 days and cough medication to use as needed.  Discussed not drinking alcohol or driving while taking the cough medication.  Patient agreeable to plan of care.  Also discussed very strict return precautions with the patient.  Jonathan Willis was evaluated in Emergency Department on 06/06/2019 for the symptoms described in the history of present illness. He was evaluated in  the context of the global COVID-19 pandemic, which necessitated consideration that the patient might be at risk for infection with the SARS-CoV-2 virus that causes COVID-19. Institutional protocols and algorithms that pertain to the evaluation of patients at risk for COVID-19 are in a state of rapid change based on information released by regulatory bodies including the CDC and federal and state organizations. These policies and algorithms were followed during the patient's care in the ED.  ____________________________________________   FINAL CLINICAL IMPRESSION(S) / ED DIAGNOSES  Dyspnea Chest pain COPD exacerbation   Harvest Dark, MD 06/06/19 1344

## 2019-06-06 NOTE — ED Notes (Addendum)
Verbal consent for d/c given by pt to this RN via [place] interpreter Hulan Amato [phone].

## 2019-06-06 NOTE — ED Triage Notes (Addendum)
Pt pov for sob and left chest pain that started +/- 1 week ago, was worse yesterday and pt says is a little better today. Pt with non productive cough, 100% room air.

## 2019-07-02 ENCOUNTER — Other Ambulatory Visit: Payer: Self-pay | Admitting: Family Medicine

## 2019-07-02 DIAGNOSIS — M542 Cervicalgia: Secondary | ICD-10-CM

## 2019-07-02 DIAGNOSIS — R519 Headache, unspecified: Secondary | ICD-10-CM

## 2019-07-04 ENCOUNTER — Ambulatory Visit (INDEPENDENT_AMBULATORY_CARE_PROVIDER_SITE_OTHER): Payer: Self-pay | Admitting: Vascular Surgery

## 2019-07-04 ENCOUNTER — Encounter (INDEPENDENT_AMBULATORY_CARE_PROVIDER_SITE_OTHER): Payer: Self-pay

## 2019-07-10 ENCOUNTER — Ambulatory Visit
Admission: RE | Admit: 2019-07-10 | Discharge: 2019-07-10 | Disposition: A | Discharge status: Home / Self Care | Payer: Medicare Other | Source: Ambulatory Visit | Attending: Family Medicine | Admitting: Family Medicine

## 2019-07-10 ENCOUNTER — Other Ambulatory Visit: Payer: Self-pay

## 2019-07-10 ENCOUNTER — Ambulatory Visit (INDEPENDENT_AMBULATORY_CARE_PROVIDER_SITE_OTHER): Payer: Medicare Other | Admitting: Family Medicine

## 2019-07-10 ENCOUNTER — Encounter: Payer: Self-pay | Admitting: Family Medicine

## 2019-07-10 VITALS — BP 126/52 | HR 76 | Temp 97.5°F | Resp 20 | Ht 60.0 in | Wt 112.0 lb

## 2019-07-10 DIAGNOSIS — R062 Wheezing: Secondary | ICD-10-CM | POA: Diagnosis not present

## 2019-07-10 DIAGNOSIS — E1169 Type 2 diabetes mellitus with other specified complication: Secondary | ICD-10-CM

## 2019-07-10 DIAGNOSIS — J431 Panlobular emphysema: Secondary | ICD-10-CM

## 2019-07-10 DIAGNOSIS — J441 Chronic obstructive pulmonary disease with (acute) exacerbation: Secondary | ICD-10-CM | POA: Diagnosis not present

## 2019-07-10 LAB — POCT URINALYSIS DIPSTICK
Bilirubin, UA: NEGATIVE
Blood, UA: NEGATIVE
Glucose, UA: POSITIVE — AB
Ketones, UA: NEGATIVE
Leukocytes, UA: NEGATIVE
Nitrite, UA: NEGATIVE
Protein, UA: NEGATIVE
Spec Grav, UA: 1.01 (ref 1.010–1.025)
Urobilinogen, UA: 0.2 E.U./dL
pH, UA: 5 (ref 5.0–8.0)

## 2019-07-10 LAB — POCT GLYCOSYLATED HEMOGLOBIN (HGB A1C): Hemoglobin A1C: 7 % — AB (ref 4.0–5.6)

## 2019-07-10 IMAGING — DX DG CHEST 2V
2 series · 2 of 2 positions shown · non-contrast
Comparison: [DATE] and [DATE] chest radiographs.

CLINICAL DATA: Wheezing

EXAM:
CHEST - 2 VIEW

[chest pa]
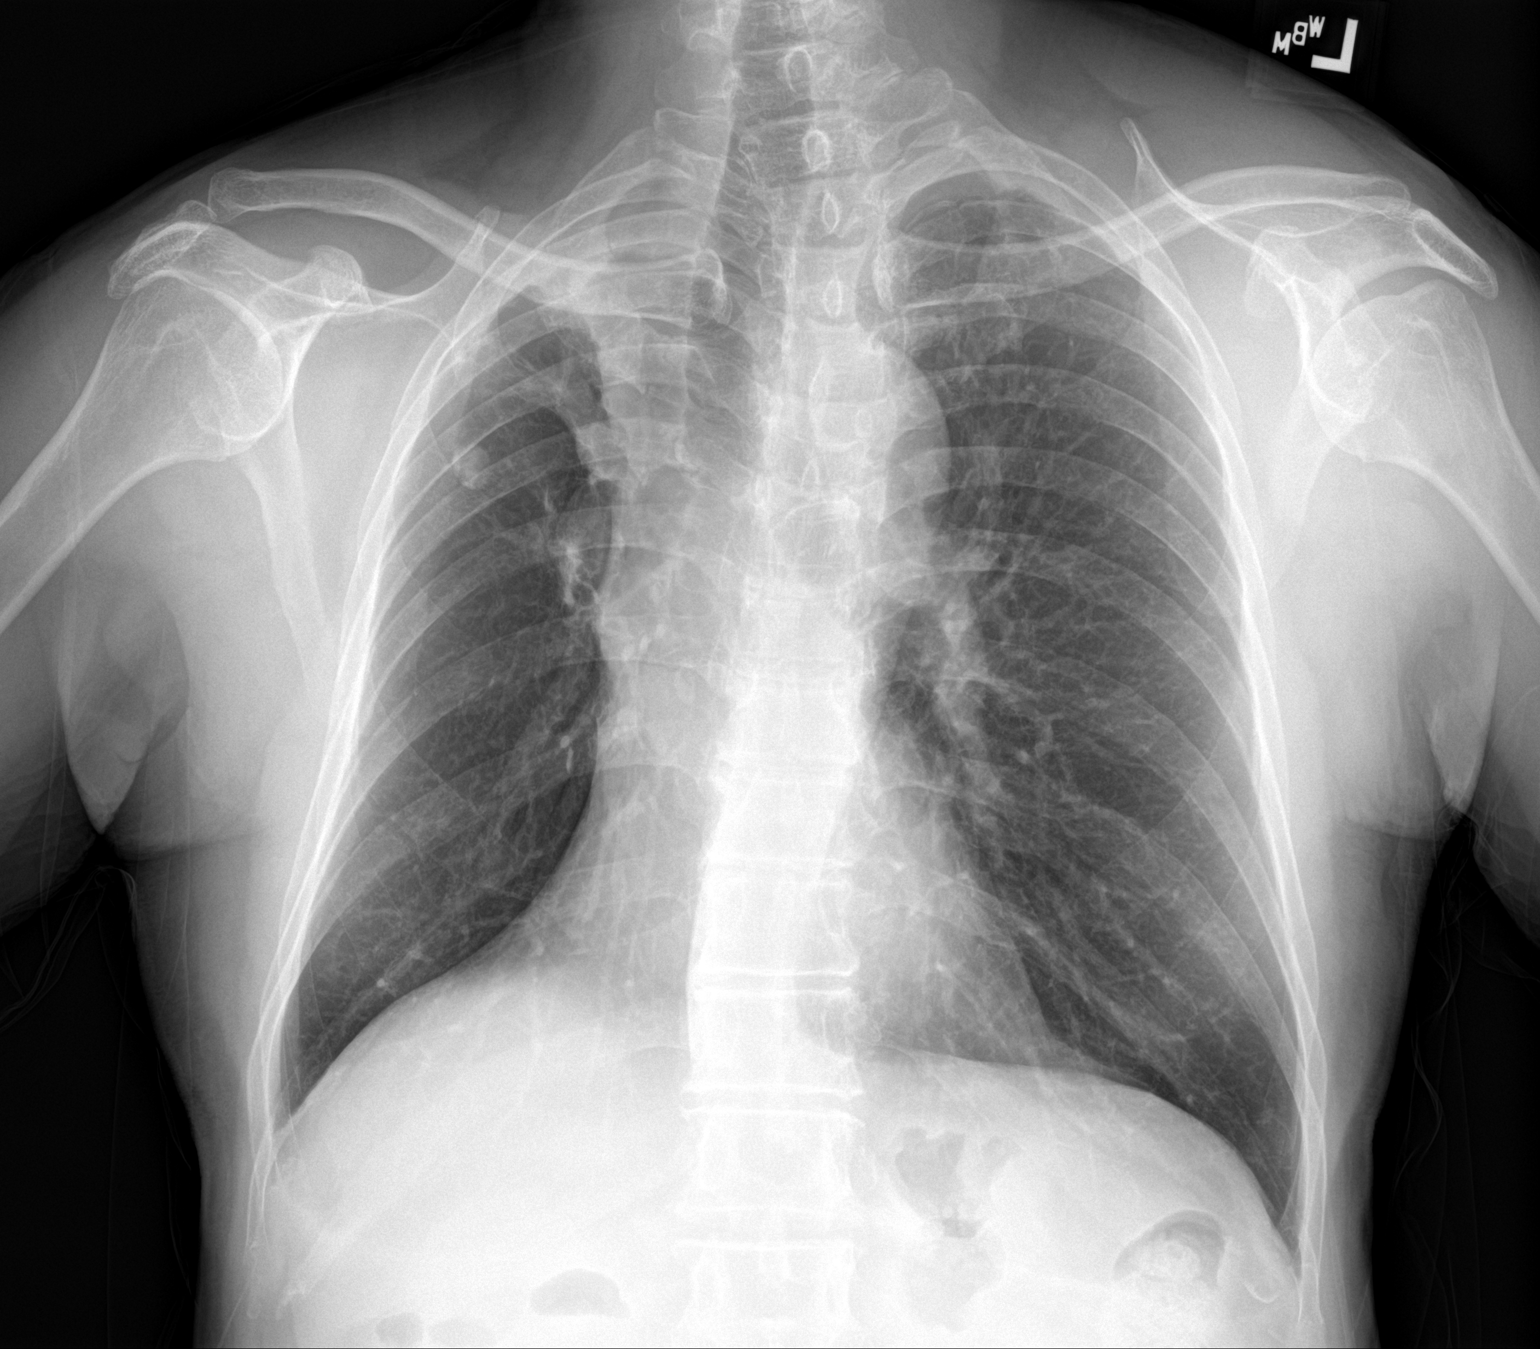

[chest lat]
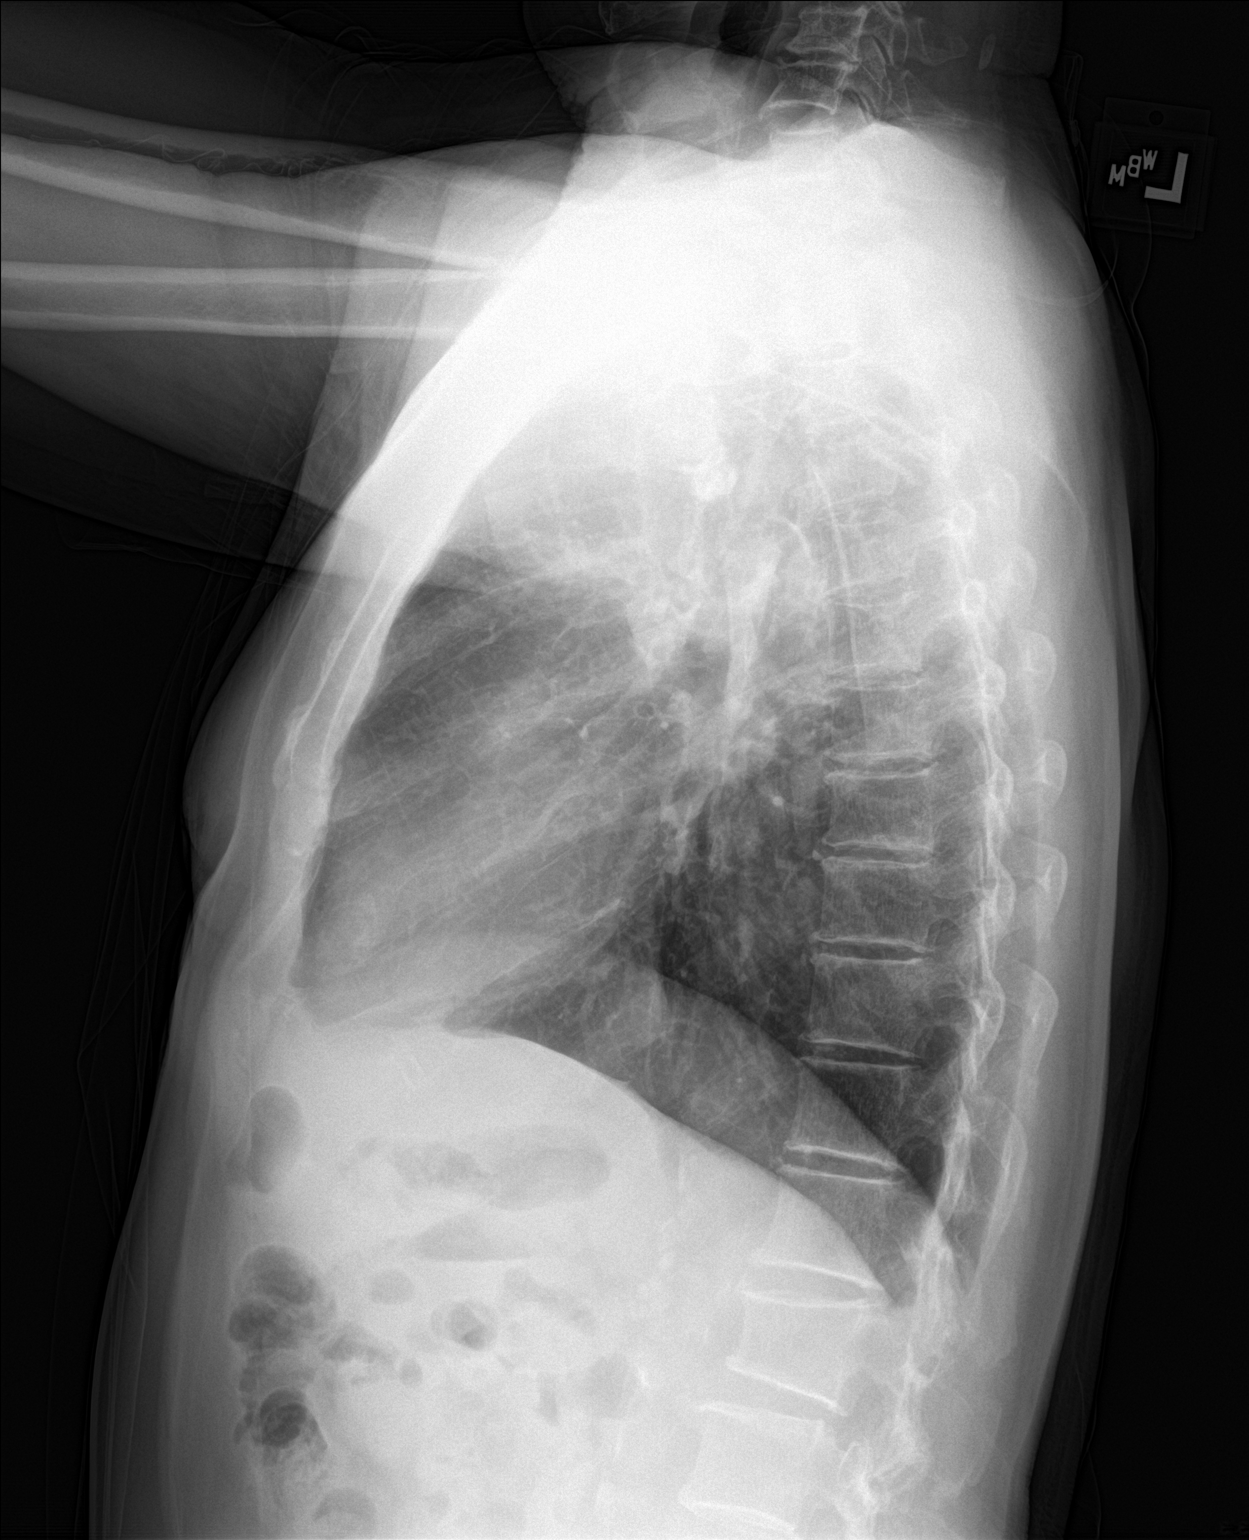

[2 of 2 positions shown; findings below may reference images not displayed]

FINDINGS: Right upper lung scarring and volume loss with calcified granuloma,
unchanged. Otherwise clear lungs.

No pneumothorax or pleural effusion.

Cardiomediastinal silhouette and aortic atherosclerotic
calcifications, unchanged.

No acute osseous abnormality.
IMPRESSION: No focal airspace disease.

Right upper lung scarring/volume loss and calcified granuloma is
unchanged.

## 2019-07-10 MED ORDER — PREDNISONE 10 MG PO TABS: ORAL_TABLET | ORAL | 0 refills | Status: AC

## 2019-07-10 MED ORDER — ALBUTEROL SULFATE HFA 108 (90 BASE) MCG/ACT IN AERS: 2.0000 | INHALATION_SPRAY | Freq: Four times a day (QID) | RESPIRATORY_TRACT | 2 refills | Status: DC | PRN

## 2019-07-10 MED ORDER — DOXYCYCLINE HYCLATE 100 MG PO TABS: 100.0000 mg | ORAL_TABLET | Freq: Two times a day (BID) | ORAL | 0 refills | Status: DC

## 2019-07-10 NOTE — Progress Notes (Signed)
Chest x-ray is within normal limits.  Will still treat as COPD exacerbation as we discussed during today's appointment.  Thanks

## 2019-07-10 NOTE — Progress Notes (Signed)
Subjective:    Patient ID: Jonathan Willis, male    DOB: 1945/04/19, 74 y.o.   MRN: [id number]  Jonathan Willis is a 74 y.o. male presenting on 07/10/2019 for Diabetes and COPD ( a lot of coughing mostly in the morning. Denies any exposure to COVID, pt been vaccinated. He complains of SOB, coughing, and chest discomfort.)   HPI  Jonathan Willis presents to clinic today with his son for concerns of cough and shortness of breath.  Reports this has been going on for 2 weeks, no improvement.  Has had non-productive cough with recent history of ER evaluation for COPD exacerbation 06/06/2019.  Son reports Jonathan Willis had gotten better before feeling worse again.  Denies fevers, sore throat, change in taste/smell, CP, abdominal pain, n/v/d.  Has not found anything that has made his symptoms better.  Diabetes Pt presents today for follow up Type 2 Diabetes Mellitus.  He/she (caps): He is with his son who reports his glucose levels have been ok, no log to review -Current diabetic medications include: Diet controlled -ACTION; IS/IS NOT: is not currently symptomatic -Actions; denies/reports/admits to: denies polydipsia, polyphagia, polyuria, headaches, diaphoresis, shakiness, chills, pain, numbness or tingling in extremities or changes in vision -Clinical course has been stable -Reports no exercise routine -Diet is moderate in salt, moderate in fat, and moderate in carbohydrates  PREVENTION Eye exam current (within 1 year) DUE - gave list of local eye providers Foot exam current (within 1 year) Up to date Lipid/ASCVD risk reduction - on statin: YES/NO: Yes  Kidney Protection (On ACE/ARB)? YES/NO: Yes    Depression screen [facility] 2/9 04/11/2019 06/05/2018 02/07/2017  Decreased Interest 0 0 0  Down, Depressed, Hopeless 0 0 0  PHQ - 2 Score 0 0 0    Social History   Tobacco Use  . Smoking status: Former Smoker    Packs/day: 1.50    Years: 44.00    Pack years: 66.00    Types: Cigarettes    Quit date: 01/11/2007    Years  since quitting: 12.5  . Smokeless tobacco: Former Network engineer Use Topics  . Alcohol use: No  . Drug use: No    Review of Systems Per HPI unless specifically indicated above     Objective:    BP (!) 126/52 (BP Location: Right Arm, Patient Position: Sitting, Cuff Size: Small)   Pulse 76   Temp (!) 97.5 F (36.4 C) (Temporal)   Resp 20   Ht 5' (1.524 m)   Wt 112 lb (50.8 kg)   SpO2 100%   BMI 21.87 kg/m   Wt Readings from Last 3 Encounters:  07/10/19 112 lb (50.8 kg)  06/06/19 120 lb (54.4 kg)  10/16/18 110 lb 9.6 oz (50.2 kg)    Physical Exam  Results for orders placed or performed in visit on 07/10/19  POCT glycosylated hemoglobin (Hb A1C)  Result Value Ref Range   Hemoglobin A1C 7.0 (A) 4.0 - 5.6 %   HbA1c POC (<> result, manual entry)     HbA1c, POC (prediabetic range)     HbA1c, POC (controlled diabetic range)    POCT Urinalysis Dipstick  Result Value Ref Range   Color, UA Yellow    Clarity, UA clear    Glucose, UA Positive (A) Negative   Bilirubin, UA negative    Ketones, UA negative    Spec Grav, UA 1.010 1.010 - 1.025   Blood, UA negative    pH, UA 5.0 5.0 - 8.0  Protein, UA Negative Negative   Urobilinogen, UA 0.2 0.2 or 1.0 E.U./dL   Nitrite, UA negative    Leukocytes, UA Negative Negative   Appearance     Odor        Assessment & Plan:   Problem List Items Addressed This Visit      Respiratory   COPD with acute exacerbation ([place])    Current COPD exacerbation x 2 weeks.  History of frequent COPD exacerbations, last one requiring hospital evaluation on 06/06/2019.  Reviewed medications and has remained compliant with current COPD medication regimen.  Has received both doses of his COVID vaccines.   Plan: 1. Doxycycline 100mg  BID x 10 days 2. Prednisone taper over 12 days as has COPD to prevent rebound worsening of symptoms 3. Refill on albuterol inhaler sent to pharmacy as well 4. Chest xray taken in clinic today and will contact with the  results 5. Follow up as needed for this in our clinic, or if any worsening of symptoms, difficulty breathing, to proceed to the [place]!      Relevant Medications   predniSONE (DELTASONE) 10 MG tablet   doxycycline (VIBRA-TABS) 100 MG tablet   albuterol (VENTOLIN HFA) 108 (90 Base) MCG/ACT inhaler     Endocrine   Type 2 diabetes mellitus with other specified complication (HCC) - Primary   Relevant Orders   POCT glycosylated hemoglobin (Hb A1C) (Completed)   POCT Urinalysis Dipstick (Completed)    Other Visit Diagnoses    Wheezing       Relevant Orders   DG Chest 2 View   Panlobular emphysema (HCC)       Relevant Medications   predniSONE (DELTASONE) 10 MG tablet   albuterol (VENTOLIN HFA) 108 (90 Base) MCG/ACT inhaler      Meds ordered this encounter  Medications  . predniSONE (DELTASONE) 10 MG tablet    Sig: Take 4 tablets (40 mg total) by mouth daily with breakfast for 4 days, THEN 3 tablets (30 mg total) daily with breakfast for 4 days, THEN 2 tablets (20 mg total) daily with breakfast for 4 days, THEN 1 tablet (10 mg total) daily with breakfast for 4 days.    Dispense:  40 tablet    Refill:  0  . doxycycline (VIBRA-TABS) 100 MG tablet    Sig: Take 1 tablet (100 mg total) by mouth 2 (two) times daily.    Dispense:  20 tablet    Refill:  0  . albuterol (VENTOLIN HFA) 108 (90 Base) MCG/ACT inhaler    Sig: Inhale 2 puffs into the lungs every 6 (six) hours as needed for wheezing or shortness of breath.    Dispense:  8 g    Refill:  2    Product selection permitted per insurance preference.      Follow up plan: Return in about 3 months (around 10/09/2019), or if symptoms worsen or fail to improve, for PRN for COPD Exacerbation and 3 months for DM, HTN and HLD follow up.   Harlin Rain, [place] Family Nurse Practitioner [facility] 07/10/2019, 8:43 AM

## 2019-07-10 NOTE — Assessment & Plan Note (Signed)
Current COPD exacerbation x 2 weeks.  History of frequent COPD exacerbations, last one requiring hospital evaluation on 06/06/2019.  Reviewed medications and has remained compliant with current COPD medication regimen.  Has received both doses of his COVID vaccines.   Plan: 1. Doxycycline 100mg  BID x 10 days 2. Prednisone taper over 12 days as has COPD to prevent rebound worsening of symptoms 3. Refill on albuterol inhaler sent to pharmacy as well 4. Chest xray taken in clinic today and will contact with the results 5. Follow up as needed for this in our clinic, or if any worsening of symptoms, difficulty breathing, to proceed to the [place]!

## 2019-07-10 NOTE — Patient Instructions (Addendum)
As we discussed, have your labs drawn in the next 1-2 weeks and we will contact you with the results.  We will contact you with the results of your chest xray.  As we discussed, treating as a COPD exacerbation today in clinic.    I have sent in a prescription for Doxycycline 100mg  to take 1 tablet 2x per day for the next 10 days.    I have also sent in a refill on his albuterol inhaler to take as needed for shortness of breath.  I have sent in a prescription for a prednisone taper to take over the next 12 days.     Schedule an appointment with a local eye provider (see list below) for yearly diabetic dilated eye exams.  Your provider would like to you have your annual eye exam. Please contact your current eye doctor or here are some good options for you to contact.   [facility]   Address: [address] [place], [place] [address] Phone: [phone]  Website: [email or web address] [address] [place], [place] [address] Phone: [phone] [email or web address]  [facility]  Address: [place], [place], [place] [address] Phone: [phone]   [facility] [address] [place], [place] [address] Phone: [phone]  [facility] Address: [place], [place], [place] [address]  Phone: [phone]  Advice on [place]   1. Daily foot inspections - Look for any breaks in the skin or areas of irritation such as blisters or red areas. Report to primary doctor or foot nurse immediately if any problems occur.  - If you have vision problems and cannot see your feet well or it is hard for you to reach your feet, ask a family member to inspect your feet daily.   2. Daily foot hygiene  - Wash feet daily, but do not soak your feet in hot water. If you do have callus, you may use warm water epsom salt foot soak and use moisturizer after. - Dry well especially between toes; Pat dry do not rub.  - If your skin is dry  use a lotion to moisturize but never between the toes.  - If your skin is wet from perspiration, use an antifungal foot powder daily.   3. Shoes and Socks  - Wear a clean pair of socks daily.  - Make sure your shoes fit well and that you are measured and properly fit each time you purchase shoes. Your shoe size may change.  - Powder your shoes with a small amount of an antifungal foot powder daily, as the shoe is the only article of clothing that is not laundered.  - Wear appropriate shoes and socks for the weather. It is especially important to protect your feet from the cold; however, don't forget about sunscreen to the tops of your feet in the hot sun.  - Wear well fitting shoes rather than slippers or flip-flops when walking or standing for long period of time.  - When at home make sure you always have protective foot wear on your feet.     Eat at least 3 meals and 1-2 snacks per day (don't skip breakfast).  Aim for no more than 5 hours between eating. - Tip: If you go >5 hours without eating and become very hungry, your body will supply it's own resources temporarily and you can gain extra weight when you eat.   The 5 Minute  Rule of Exercise - Promise yourself to at least do 5 minutes of exercise (make sure you time it), and if at the end of 5 minutes (this is the hardest part of the work-out), if you still feel like you want stop (or not motivated to continue) then allow yourself to stop. Otherwise, more often than not you will feel encouraged that you can continue for a little while longer or even more!   My 5 to Fitness!  5: fruits and vegetables per day (work on 9 per day if you are at 5) 4: exercise 4-5 times per week for at least 30 minutes (walking counts!) 3: meals per day (don't skip breakfast!), no more than 5 hours between meals 2: habits to quit -smoking -excess alcohol use (men >2 beer/day; women >1beer/day) 1: sweet per day (2 cookies, 1 small cup of ice cream, 12 oz  soda)  These are general tips for healthy living. Try to start with 1 or 2 habit TODAY and make it a part of your life for several months. You set a goal today to work on:  Once you have 1 or 2 habits down for several months, try to begin working on your next healthy habit. With every single step you take, you will be leading a healthier lifestyle!   Diet Recommendations for Diabetes   Starchy (carb) foods include: Bread, rice, pasta, potatoes, corn, crackers, bagels, muffins, all baked goods.   Protein foods include: Meat, fish, poultry, eggs, dairy foods, and beans such as pinto and kidney beans (beans also provide carbohydrate).   1. Eat at least 3 meals and 1-2 snacks per day. Never go more than 4-5 hours while awake without eating.   2. Limit starchy foods to TWO per meal and ONE per snack. ONE portion of a starchy  food is equal to the following:   - ONE slice of bread (or its equivalent, such as half of a hamburger bun).   - 1/2 cup of a "scoopable" starchy food such as potatoes or rice.   - 1 OUNCE (28 grams) of starchy snacks (crackers or pretzels, look on label).   - 15 grams of carbohydrate as shown on food label.   3. Both lunch and dinner should include a protein food, a carb food, and vegetables.   - Obtain twice as many veg's as protein or carbohydrate foods for both lunch and dinner.   - Try to keep frozen veg's on hand for a quick vegetable serving.     - Fresh or frozen veg's are best.   4. Breakfast should always include protein.    You can learn more information online about your diabetes at American Diabetes Association: http://www.diabetes.org/ - General self-care (diet, medications, blood sugar checks). - Diet recommendations - There are even recipes available for you to look at and try.  We will plan to see you back in 3 months for next diabetes, hypertension and hyperlipidemia follow up.  Return to clinic as needed for current COPD exacerbation.  You will  receive a survey after today's visit either digitally by e-mail or paper by [employer]. Your experiences and feedback matter to [place].  Please respond so we know how we are doing as we provide care for you.  Call us with any questions/concerns/needs.  It is my goal to be available to you for your health concerns.  Thanks for choosing me to be a partner in your healthcare needs!  Harlin Rain, FNP-C Family Nurse Practitioner  [facility] Phone: [phone]

## 2019-08-01 ENCOUNTER — Other Ambulatory Visit: Payer: Self-pay | Admitting: Family Medicine

## 2019-08-01 DIAGNOSIS — M542 Cervicalgia: Secondary | ICD-10-CM

## 2019-08-01 DIAGNOSIS — R519 Headache, unspecified: Secondary | ICD-10-CM

## 2019-08-01 NOTE — Telephone Encounter (Signed)
Requested medication (s) are due for refill today: yes  Requested medication (s) are on the active medication list: yes  Last refill: 07/02/19  Future visit scheduled: no  Notes to clinic:  not delegated    Requested Prescriptions  Pending Prescriptions Disp Refills   baclofen (LIORESAL) 10 MG tablet [Pharmacy Med Name: BACLOFEN 10 MG TABLET] 90 tablet 1    Sig: TAKE 1 TABLET (10 MG TOTAL) BY MOUTH 3 (THREE) TIMES DAILY. AS NEEDED      Not Delegated - Analgesics:  Muscle Relaxants Failed - 08/01/2019  2:35 PM      Failed - This refill cannot be delegated      Passed - Valid encounter within last 6 months    Recent Outpatient Visits           3 weeks ago Type 2 diabetes mellitus with other specified complication, without long-term current use of insulin ([place])   [facility], Lupita Raider, FNP   3 months ago COPD with acute exacerbation [facility])   [facility] Olin Hauser, DO   8 months ago COPD exacerbation [facility])   [facility], Jerrel Ivory, NP   9 months ago Type 2 diabetes mellitus with other specified complication, without long-term current use of insulin [facility])   [facility] Merrilyn Puma, Jerrel Ivory, NP   1 year ago Acute exacerbation of chronic obstructive pulmonary disease (COPD) [facility])   [facility] Mikey [employer], NP

## 2019-09-01 ENCOUNTER — Other Ambulatory Visit: Payer: Self-pay | Admitting: Family Medicine

## 2019-09-01 DIAGNOSIS — E785 Hyperlipidemia, unspecified: Secondary | ICD-10-CM

## 2019-09-01 NOTE — Telephone Encounter (Signed)
Requested Prescriptions  Pending Prescriptions Disp Refills   atorvastatin (LIPITOR) 20 MG tablet [Pharmacy Med Name: ATORVASTATIN 20 MG TABLET] 90 tablet 2    Sig: TAKE 1 TABLET BY MOUTH EVERY DAY     Cardiovascular:  Antilipid - Statins Failed - 09/01/2019  9:38 AM      Failed - Triglycerides in normal range and within 360 days    Triglycerides  Date Value Ref Range Status  10/16/2018 208 (H) <150 mg/dL Final    Comment:    . If a non-fasting specimen was collected, consider repeat triglyceride testing on a fasting specimen if clinically indicated.  Yates Decamp et al. J. of Clin. Lipidol. 8242;3:536-144. Marland Kitchen          Passed - Total Cholesterol in normal range and within 360 days    Cholesterol  Date Value Ref Range Status  10/16/2018 179 <200 mg/dL Final         Passed - LDL in normal range and within 360 days    LDL Cholesterol (Calc)  Date Value Ref Range Status  10/16/2018 98 mg/dL (calc) Final    Comment:    Reference range: <100 . Desirable range <100 mg/dL for primary prevention;   <70 mg/dL for patients with CHD or diabetic patients  with > or = 2 CHD risk factors. Marland Kitchen LDL-C is now calculated using the Martin-Hopkins  calculation, which is a validated novel method providing  better accuracy than the Friedewald equation in the  estimation of LDL-C.  Cresenciano Genre et al. Annamaria Helling. 3154;008(67): 2061-2068  (http://education.QuestDiagnostics.com/faq/FAQ164)          Passed - HDL in normal range and within 360 days    HDL  Date Value Ref Range Status  10/16/2018 49 > OR = 40 mg/dL Final         Passed - Patient is not pregnant      Passed - Valid encounter within last 12 months    Recent Outpatient Visits          1 month ago Type 2 diabetes mellitus with other specified complication, without long-term current use of insulin ([place])   [facility], Lupita Raider, FNP   4 months ago COPD with acute exacerbation [facility])   [facility]  Olin Hauser, DO   9 months ago COPD exacerbation [facility])   [facility] Merrilyn Puma, Jerrel Ivory, NP   10 months ago Type 2 diabetes mellitus with other specified complication, without long-term current use of insulin [facility])   [facility] Merrilyn Puma, Jerrel Ivory, NP   1 year ago Acute exacerbation of chronic obstructive pulmonary disease (COPD) [facility])   [facility] Mikey [employer], NP

## 2019-09-04 ENCOUNTER — Other Ambulatory Visit: Payer: Self-pay | Admitting: Family Medicine

## 2019-09-04 DIAGNOSIS — M542 Cervicalgia: Secondary | ICD-10-CM

## 2019-09-04 DIAGNOSIS — R519 Headache, unspecified: Secondary | ICD-10-CM

## 2019-09-04 NOTE — Telephone Encounter (Signed)
Requested medication (s) are due for refill today: yes  Requested medication (s) are on the active medication list: yes  Last refill: 08/01/19  Future visit scheduled: no  Notes to clinic:  not delegated    Requested Prescriptions  Pending Prescriptions Disp Refills   baclofen (LIORESAL) 10 MG tablet [Pharmacy Med Name: BACLOFEN 10 MG TABLET] 90 tablet 1    Sig: TAKE 1 TABLET (10 MG TOTAL) BY MOUTH 3 (THREE) TIMES DAILY. AS NEEDED      Not Delegated - Analgesics:  Muscle Relaxants Failed - 09/04/2019  1:33 PM      Failed - This refill cannot be delegated      Passed - Valid encounter within last 6 months    Recent Outpatient Visits           1 month ago Type 2 diabetes mellitus with other specified complication, without long-term current use of insulin ([place])   [facility], Lupita Raider, FNP   4 months ago COPD with acute exacerbation [facility])   [facility] Olin Hauser, DO   9 months ago COPD exacerbation [facility])   [facility], Jerrel Ivory, NP   10 months ago Type 2 diabetes mellitus with other specified complication, without long-term current use of insulin [facility])   [facility] Merrilyn Puma, Jerrel Ivory, NP   1 year ago Acute exacerbation of chronic obstructive pulmonary disease (COPD) [facility])   [facility] [employer]key [employer], NP

## 2019-09-04 NOTE — Telephone Encounter (Signed)
Requested medication (s) are due for refill today: yes  Requested medication (s) are on the active medication list: yes  Last refill:  08/01/19  Future visit scheduled: no  Notes to clinic: not delegated    Requested Prescriptions  Pending Prescriptions Disp Refills   baclofen (LIORESAL) 10 MG tablet [Pharmacy Med Name: BACLOFEN 10 MG TABLET] 90 tablet 1    Sig: TAKE 1 TABLET (10 MG TOTAL) BY MOUTH 3 (THREE) TIMES DAILY. AS NEEDED      Not Delegated - Analgesics:  Muscle Relaxants Failed - 09/04/2019  1:33 PM      Failed - This refill cannot be delegated      Passed - Valid encounter within last 6 months    Recent Outpatient Visits           1 month ago Type 2 diabetes mellitus with other specified complication, without long-term current use of insulin ([place])   [facility], Lupita Raider, FNP   4 months ago COPD with acute exacerbation [facility])   [facility] Olin Hauser, DO   9 months ago COPD exacerbation [facility])   [facility], Jerrel Ivory, NP   10 months ago Type 2 diabetes mellitus with other specified complication, without long-term current use of insulin [facility])   [facility] Merrilyn Puma, Jerrel Ivory, NP   1 year ago Acute exacerbation of chronic obstructive pulmonary disease (COPD) [facility])   [facility] Mikey [employer], NP

## 2019-09-04 NOTE — Telephone Encounter (Signed)
Not a Pt at [facility]

## 2019-09-16 ENCOUNTER — Other Ambulatory Visit: Payer: Self-pay | Admitting: Nurse Practitioner

## 2019-09-16 DIAGNOSIS — R42 Dizziness and giddiness: Secondary | ICD-10-CM

## 2019-09-16 NOTE — Telephone Encounter (Signed)
Requested medication (s) are due for refill today:  Yes  Requested medication (s) are on the active medication list:  yes  Future visit scheduled:  no  Last Refill: 10/16/18; #90; RF x1  Notes to Clinic:  Rx is not delegated.   Requested Prescriptions  Pending Prescriptions Disp Refills   meclizine (ANTIVERT) 12.5 MG tablet [Pharmacy Med Name: MECLIZINE 12.5 MG TABLET] 90 tablet 1    Sig: Take 1 tablet (12.5 mg total) by mouth 3 (three) times daily as needed for dizziness.      Not Delegated - Gastroenterology: Antiemetics Failed - 09/16/2019  4:04 PM      Failed - This refill cannot be delegated      Passed - Valid encounter within last 6 months    Recent Outpatient Visits           2 months ago Type 2 diabetes mellitus with other specified complication, without long-term current use of insulin ([place])   [facility], Lupita Raider, FNP   5 months ago COPD with acute exacerbation [facility])   [facility] Olin Hauser, DO   9 months ago COPD exacerbation [facility])   [facility], Jerrel Ivory, NP   11 months ago Type 2 diabetes mellitus with other specified complication, without long-term current use of insulin [facility])   [facility] Mikey [employer], NP   1 year ago Acute exacerbation of chronic obstructive pulmonary disease (COPD) [facility])   [facility] Mikey [employer], NP

## 2019-10-01 ENCOUNTER — Other Ambulatory Visit: Payer: Self-pay | Admitting: Family Medicine

## 2019-10-01 DIAGNOSIS — M542 Cervicalgia: Secondary | ICD-10-CM

## 2019-10-01 DIAGNOSIS — R519 Headache, unspecified: Secondary | ICD-10-CM

## 2019-10-04 ENCOUNTER — Other Ambulatory Visit: Payer: Self-pay | Admitting: Family Medicine

## 2019-10-04 DIAGNOSIS — R350 Frequency of micturition: Secondary | ICD-10-CM

## 2019-10-15 ENCOUNTER — Other Ambulatory Visit: Payer: Self-pay | Admitting: Family Medicine

## 2019-10-15 DIAGNOSIS — E1169 Type 2 diabetes mellitus with other specified complication: Secondary | ICD-10-CM

## 2019-10-15 NOTE — Telephone Encounter (Signed)
Requested Prescriptions  Pending Prescriptions Disp Refills  . ACCU-CHEK AVIVA PLUS test strip [Pharmacy Med Name: [place] TEST STRP] 50 strip 3    Sig: USE 1 TEST STRIP TO TEST BLOOD SUGAR DAILY     Endocrinology: Diabetes - Testing Supplies Passed - 10/15/2019  1:54 AM      Passed - Valid encounter within last 12 months    Recent Outpatient Visits          3 months ago Type 2 diabetes mellitus with other specified complication, without long-term current use of insulin ([place])   [facility], Lupita Raider, FNP   6 months ago COPD with acute exacerbation [facility])   [facility] Olin Hauser, DO   10 months ago COPD exacerbation [facility])   [facility] Merrilyn Puma, Jerrel Ivory, NP   12 months ago Type 2 diabetes mellitus with other specified complication, without long-term current use of insulin [facility])   [facility] Merrilyn Puma, Jerrel Ivory, NP   1 year ago Acute exacerbation of chronic obstructive pulmonary disease (COPD) [facility])   [facility] [employer], NP

## 2019-11-05 ENCOUNTER — Other Ambulatory Visit: Payer: Self-pay | Admitting: Family Medicine

## 2019-11-05 DIAGNOSIS — M542 Cervicalgia: Secondary | ICD-10-CM

## 2019-11-05 DIAGNOSIS — R519 Headache, unspecified: Secondary | ICD-10-CM

## 2019-11-05 NOTE — Telephone Encounter (Signed)
Requested medication (s) are due for refill today: no  Requested medication (s) are on the active medication list: yes  Last refill:  10/01/19  Future visit scheduled: no  Notes to clinic:  med not delegated to NT to RF   Requested Prescriptions  Pending Prescriptions Disp Refills   baclofen (LIORESAL) 10 MG tablet [Pharmacy Med Name: BACLOFEN 10 MG TABLET] 90 tablet 1    Sig: TAKE 1 TABLET (10 MG TOTAL) BY MOUTH 3 (THREE) TIMES DAILY. AS NEEDED      Not Delegated - Analgesics:  Muscle Relaxants Failed - 11/05/2019  8:33 AM      Failed - This refill cannot be delegated      Passed - Valid encounter within last 6 months    Recent Outpatient Visits           3 months ago Type 2 diabetes mellitus with other specified complication, without long-term current use of insulin ([place])   [facility], Lupita Raider, FNP   6 months ago COPD with acute exacerbation [facility])   [facility] Olin Hauser, DO   11 months ago COPD exacerbation [facility])   [facility] Merrilyn Puma, Jerrel Ivory, NP   1 year ago Type 2 diabetes mellitus with other specified complication, without long-term current use of insulin [facility])   [facility] Merrilyn Puma, Jerrel Ivory, NP   1 year ago Acute exacerbation of chronic obstructive pulmonary disease (COPD) [facility])   [facility] Mikey College, NP

## 2019-11-07 ENCOUNTER — Other Ambulatory Visit: Payer: Self-pay | Admitting: Family Medicine

## 2019-11-07 DIAGNOSIS — I10 Essential (primary) hypertension: Secondary | ICD-10-CM

## 2019-11-07 NOTE — Telephone Encounter (Signed)
Requested  medications are  due for refill today yes  Requested medications are on the active medication list yes  Last refill 5/15  Last visit Jan 2021 was last visit addressing HTN, April 2021 visit states return in 3 months to address HTN  Future visit scheduled no  Notes to clinic failed protocol of visit within 6 months, no upcoming visit scheduled.

## 2019-12-10 ENCOUNTER — Other Ambulatory Visit: Payer: Self-pay | Admitting: Family Medicine

## 2019-12-10 DIAGNOSIS — R519 Headache, unspecified: Secondary | ICD-10-CM

## 2019-12-10 DIAGNOSIS — M542 Cervicalgia: Secondary | ICD-10-CM

## 2019-12-10 NOTE — Telephone Encounter (Signed)
Requested medication (s) are due for refill today: yes  Requested medication (s) are on the active medication list: yes  Last refill:  11/05/19  Future visit scheduled: no  Notes to clinic:  med not delegated to NT to RF   Requested Prescriptions  Pending Prescriptions Disp Refills   baclofen (LIORESAL) 10 MG tablet [Pharmacy Med Name: BACLOFEN 10 MG TABLET] 90 tablet 1    Sig: TAKE 1 TABLET (10 MG TOTAL) BY MOUTH 3 (THREE) TIMES DAILY. AS NEEDED      Not Delegated - Analgesics:  Muscle Relaxants Failed - 12/10/2019  9:32 AM      Failed - This refill cannot be delegated      Passed - Valid encounter within last 6 months    Recent Outpatient Visits           5 months ago Type 2 diabetes mellitus with other specified complication, without long-term current use of insulin ([place])   [facility], Lupita Raider, FNP   8 months ago COPD with acute exacerbation [facility])   [facility] Olin Hauser, DO   1 year ago COPD exacerbation [facility])   [facility] Merrilyn Puma, Jerrel Ivory, NP   1 year ago Type 2 diabetes mellitus with other specified complication, without long-term current use of insulin [facility])   [facility] Merrilyn Puma, Jerrel Ivory, NP   1 year ago Acute exacerbation of chronic obstructive pulmonary disease (COPD) [facility])   [facility] Mikey College, NP

## 2019-12-18 ENCOUNTER — Other Ambulatory Visit: Payer: Self-pay | Admitting: Family Medicine

## 2019-12-18 DIAGNOSIS — I10 Essential (primary) hypertension: Secondary | ICD-10-CM

## 2019-12-18 NOTE — Telephone Encounter (Signed)
Requested Prescriptions  Pending Prescriptions Disp Refills   hydrochlorothiazide (HYDRODIURIL) 12.5 MG tablet [Pharmacy Med Name: HYDROCHLOROTHIAZIDE 12.5 MG TB] 90 tablet 0    Sig: TAKE 1 TABLET BY MOUTH EVERY DAY     Cardiovascular: Diuretics - Thiazide Failed - 12/18/2019  1:12 AM      Failed - K in normal range and within 360 days    Potassium  Date Value Ref Range Status  06/06/2019 3.4 (L) 3.5 - 5.1 mmol/L Final  02/04/2014 4.1 3.5 - 5.1 mmol/L Final         Passed - Ca in normal range and within 360 days    Calcium  Date Value Ref Range Status  06/06/2019 9.6 8.9 - 10.3 mg/dL Final   Calcium, Total  Date Value Ref Range Status  02/04/2014 9.1 8.5 - 10.1 mg/dL Final         Passed - Cr in normal range and within 360 days    Creat  Date Value Ref Range Status  10/16/2018 1.01 0.70 - 1.18 mg/dL Final    Comment:    For patients >55 years of age, the reference limit for Creatinine is approximately 13% higher for people identified as African-American. .    Creatinine, Ser  Date Value Ref Range Status  06/06/2019 0.94 0.61 - 1.24 mg/dL Final         Passed - Na in normal range and within 360 days    Sodium  Date Value Ref Range Status  06/06/2019 136 135 - 145 mmol/L Final  02/04/2014 136 136 - 145 mmol/L Final         Passed - Last BP in normal range    BP Readings from Last 1 Encounters:  07/10/19 (!) 126/52         Passed - Valid encounter within last 6 months    Recent Outpatient Visits          5 months ago Type 2 diabetes mellitus with other specified complication, without long-term current use of insulin ([place])   [facility], Lupita Raider, FNP   8 months ago COPD with acute exacerbation [facility])   [facility] Olin Hauser, DO   1 year ago COPD exacerbation [facility])   [facility] Merrilyn Puma, Jerrel Ivory, NP   1 year ago Type 2 diabetes mellitus with other specified complication, without long-term  current use of insulin [facility])   [facility] Merrilyn Puma, Jerrel Ivory, NP   1 year ago Acute exacerbation of chronic obstructive pulmonary disease (COPD) [facility])   [facility] Merrilyn Puma, Jerrel Ivory, NP             Reminder for patient to call and schedule a follow-up appointment with provider.

## 2020-01-11 ENCOUNTER — Other Ambulatory Visit: Payer: Self-pay | Admitting: Family Medicine

## 2020-01-11 DIAGNOSIS — R519 Headache, unspecified: Secondary | ICD-10-CM

## 2020-01-11 DIAGNOSIS — M542 Cervicalgia: Secondary | ICD-10-CM

## 2020-01-11 NOTE — Telephone Encounter (Signed)
Requested medication (s) are due for refill today: yes  Requested medication (s) are on the active medication list: yes  Last refill:  12/10/19  Future visit scheduled: no  Notes to clinic:  Using [place] interpreter # [phone]- spoke with pt's daughter and she stated she will call Monday to schedule appt.   Requested Prescriptions  Pending Prescriptions Disp Refills   baclofen (LIORESAL) 10 MG tablet [Pharmacy Med Name: BACLOFEN 10 MG TABLET] 90 tablet 0    Sig: Take 1 tablet (10 mg total) by mouth 3 (three) times daily. As needed      Not Delegated - Analgesics:  Muscle Relaxants Failed - 01/11/2020  8:45 AM      Failed - This refill cannot be delegated      Failed - Valid encounter within last 6 months    Recent Outpatient Visits           6 months ago Type 2 diabetes mellitus with other specified complication, without long-term current use of insulin ([place])   [facility], Lupita Raider, FNP   9 months ago COPD with acute exacerbation [facility])   [facility] Olin Hauser, DO   1 year ago COPD exacerbation [facility])   [facility] Merrilyn Puma, Jerrel Ivory, NP   1 year ago Type 2 diabetes mellitus with other specified complication, without long-term current use of insulin [facility])   [facility] Merrilyn Puma, Jerrel Ivory, NP   1 year ago Acute exacerbation of chronic obstructive pulmonary disease (COPD) [facility])   [facility] Mikey College, NP

## 2020-01-13 ENCOUNTER — Ambulatory Visit (INDEPENDENT_AMBULATORY_CARE_PROVIDER_SITE_OTHER): Payer: Medicare Other | Admitting: Family Medicine

## 2020-01-13 ENCOUNTER — Encounter: Payer: Self-pay | Admitting: Family Medicine

## 2020-01-13 ENCOUNTER — Other Ambulatory Visit: Payer: Self-pay

## 2020-01-13 VITALS — BP 158/62 | HR 100 | Temp 97.5°F | Resp 20 | Ht 60.0 in | Wt 105.4 lb

## 2020-01-13 DIAGNOSIS — I10 Essential (primary) hypertension: Secondary | ICD-10-CM | POA: Diagnosis not present

## 2020-01-13 DIAGNOSIS — E1169 Type 2 diabetes mellitus with other specified complication: Secondary | ICD-10-CM | POA: Diagnosis not present

## 2020-01-13 DIAGNOSIS — J3489 Other specified disorders of nose and nasal sinuses: Secondary | ICD-10-CM | POA: Diagnosis not present

## 2020-01-13 DIAGNOSIS — J441 Chronic obstructive pulmonary disease with (acute) exacerbation: Secondary | ICD-10-CM | POA: Diagnosis not present

## 2020-01-13 LAB — POCT GLYCOSYLATED HEMOGLOBIN (HGB A1C): Hemoglobin A1C: 6.6 % — AB (ref 4.0–5.6)

## 2020-01-13 MED ORDER — PROMETHAZINE-DM 6.25-15 MG/5ML PO SYRP: 5.0000 mL | ORAL_SOLUTION | Freq: Four times a day (QID) | ORAL | 0 refills | Status: DC | PRN

## 2020-01-13 MED ORDER — LEVOFLOXACIN 500 MG PO TABS: 500.0000 mg | ORAL_TABLET | Freq: Every day | ORAL | 0 refills | Status: DC

## 2020-01-13 MED ORDER — PREDNISONE 10 MG PO TABS: ORAL_TABLET | ORAL | 0 refills | Status: AC

## 2020-01-13 NOTE — Assessment & Plan Note (Signed)
COPD with acute exacerbation.  Will treat with levaquin 500mg  daily x 7 days, prednisone taper over 9 days, promethazine DM cough medicine for nighttime use and has albuterol inhaler at home, not needing refill.  Strict ER precautions.  Plan: 1. Begin levaquin 500mg  daily x 7 days 2. Begin prednisone taper, as directed, over the next 9 days 3. Can take promethazine DM 72mL every 6 hours as needed for cough at night.  Discussed with daughter possible sedation with this medication and to avoid driving or operating heavy machinery while using this. 4. Can use albuterol inhaler 1-2 puffs every 4-6 hours as needed for cough/SOB/wheezing 5. Strict ER precautions provided 6. RTC PRN

## 2020-01-13 NOTE — Assessment & Plan Note (Signed)
Uncontrolled hypertension.  BP is not at goal < 130/80, likely elevated secondary to COPD exacerbation today.  Pt is working on lifestyle modifications.  Taking medications tolerating well without side effects.   Plan: 1. Continue taking hydrochlorothiazide 12.5mg  daily, metorpolol tartrate 50mg  BID, and telmisartan 40mg  daily 2. Obtain labs before next visit  3. Encouraged heart healthy diet and increasing exercise to 30 minutes most days of the week, going no more than 2 days in a row without exercise. 4. Check BP 1-2 x per week at home, keep log, and bring to clinic at next appointment. 5. Follow up 6 months.

## 2020-01-13 NOTE — Progress Notes (Signed)
Subjective:    Patient ID: Jonathan Willis, male    DOB: 01-15-1946, 74 y.o.   MRN: [id number]  Jonathan Willis is a 74 y.o. male presenting on 01/13/2020 for COPD (coughing, productive coughing. symptoms are worse at night. He cough so much at bedtime he cannot sleep. Tightiness in chest ) and Sinusitis   HPI  Mr. Dolberry presents to clinic with his daughter as his interpreter.  Reports that he has not been feeling well for 2 weeks or so, has been having wheezing, productive coughing that is worse at night.  Has sinus pressure with discomfort around his eyes.  Has had some drainage from his sinuses and irritation to his eyes.  Denies fevers, sore throat, change in taste/smell, CP, abdominal pain, N/V/D, body aches.  Denies having COVID testing.  Has had both COVID vaccines.  Diabetes Pt presents today for follow up Type 2 Diabetes Mellitus.  He/she (caps): He ACTION; IS/IS NOT: is not checking AM CBG at home. -Current diabetic medications include: dietary and lifestyle modifications -ACTION; IS/IS NOT: is not currently symptomatic -Actions; denies/reports/admits to: denies polydipsia, polyphagia, polyuria, headaches, diaphoresis, shakiness, chills, pain, numbness or tingling in extremities or changes in vision -Clinical course has been improving  -Reports no exercise routine -Diet is high in salt, high in fat, and high in carbohydrates  PREVENTION Eye exam current (within 1 year) Due, encouraged to schedule Foot exam current (within 1 year) Due, will complete at next visit Lipid/ASCVD risk reduction - on statin: YES/NO: Yes  Kidney Protection (On ACE/ARB)? YES/NO: Yes    Depression screen [facility] 2/9 04/11/2019 06/05/2018 02/07/2017  Decreased Interest 0 0 0  Down, Depressed, Hopeless 0 0 0  PHQ - 2 Score 0 0 0    Social History   Tobacco Use  . Smoking status: Former Smoker    Packs/day: 1.50    Years: 44.00    Pack years: 66.00    Types: Cigarettes    Quit date: 01/11/2007    Years since  quitting: 13.0  . Smokeless tobacco: Former Network engineer  . Vaping Use: Never used  Substance Use Topics  . Alcohol use: No  . Drug use: No    Review of Systems  Constitutional: Negative.   HENT: Positive for sinus pressure and sinus pain. Negative for congestion, dental problem, drooling, ear discharge, ear pain, facial swelling, hearing loss, mouth sores, nosebleeds, postnasal drip, rhinorrhea, sneezing, sore throat, tinnitus, trouble swallowing and voice change.   Eyes: Positive for pain. Negative for photophobia, discharge, redness, itching and visual disturbance.       Reports aching with pressure around eyes  Respiratory: Positive for cough and wheezing. Negative for apnea, choking, chest tightness, shortness of breath and stridor.   Cardiovascular: Negative.   Gastrointestinal: Negative.   Endocrine: Negative.   Genitourinary: Negative.   Musculoskeletal: Negative.   Skin: Negative.   Allergic/Immunologic: Negative.   Neurological: Negative.   Hematological: Negative.   Psychiatric/Behavioral: Negative.    Per HPI unless specifically indicated above     Objective:    BP (!) 158/62 (BP Location: Right Arm, Patient Position: Sitting, Cuff Size: Normal)   Pulse 100   Temp (!) 97.5 F (36.4 C) (Oral)   Resp 20   Ht 5' (1.524 m)   Wt 105 lb 6.4 oz (47.8 kg)   SpO2 100%   BMI 20.58 kg/m   Wt Readings from Last 3 Encounters:  01/13/20 105 lb 6.4 oz (47.8 kg)  07/10/19 112  lb (50.8 kg)  06/06/19 120 lb (54.4 kg)    Physical Exam Vitals and nursing note reviewed.  Constitutional:      General: He is not in acute distress.    Appearance: Normal appearance. He is well-developed, well-groomed and normal weight. He is not ill-appearing or toxic-appearing.  HENT:     Head: Normocephalic and atraumatic.     Nose: Congestion and rhinorrhea present.     Right Turbinates: Swollen and pale.     Left Turbinates: Swollen and pale.     Right Sinus: No maxillary sinus  tenderness or frontal sinus tenderness.     Left Sinus: No maxillary sinus tenderness or frontal sinus tenderness.     Comments: Lizbeth Bark is in place, covering mouth and nose. Eyes:     General: Lids are normal. Vision grossly intact. No allergic shiner.       Right eye: No discharge.        Left eye: No discharge.     Extraocular Movements: Extraocular movements intact.     Right eye: Normal extraocular motion and no nystagmus.     Left eye: Normal extraocular motion and no nystagmus.     Conjunctiva/sclera: Conjunctivae normal.     Right eye: Right conjunctiva is not injected. No chemosis, exudate or hemorrhage.    Left eye: Left conjunctiva is not injected. No chemosis, exudate or hemorrhage.    Pupils: Pupils are equal, round, and reactive to light.  Cardiovascular:     Rate and Rhythm: Normal rate and regular rhythm.     Pulses: Normal pulses.     Heart sounds: Normal heart sounds. No murmur heard.  No friction rub. No gallop.   Pulmonary:     Effort: Pulmonary effort is normal. No respiratory distress.     Breath sounds: Examination of the right-upper field reveals wheezing. Examination of the right-lower field reveals wheezing. Examination of the left-lower field reveals wheezing. Wheezing present.     Comments: Mild inspiratory and expiratory wheezes Musculoskeletal:     Right lower leg: No edema.     Left lower leg: No edema.  Skin:    General: Skin is warm and dry.     Capillary Refill: Capillary refill takes less than 2 seconds.  Neurological:     General: No focal deficit present.     Mental Status: He is alert and oriented to person, place, and time.  Psychiatric:        Attention and Perception: Attention and perception normal.        Mood and Affect: Mood and affect normal.        Speech: Speech normal.        Behavior: Behavior normal. Behavior is cooperative.        Thought Content: Thought content normal.        Cognition and Memory: Cognition and memory  normal.    Results for orders placed or performed in visit on 01/13/20  POCT HgB A1C  Result Value Ref Range   Hemoglobin A1C 6.6 (A) 4.0 - 5.6 %      Assessment & Plan:   Problem List Items Addressed This Visit      Cardiovascular and Mediastinum   Essential hypertension    Uncontrolled hypertension.  BP is not at goal < 130/80, likely elevated secondary to COPD exacerbation today.  Pt is working on lifestyle modifications.  Taking medications tolerating well without side effects.   Plan: 1. Continue taking hydrochlorothiazide 12.5mg  daily, metorpolol tartrate 50mg   BID, and telmisartan 40mg  daily 2. Obtain labs before next visit  3. Encouraged heart healthy diet and increasing exercise to 30 minutes most days of the week, going no more than 2 days in a row without exercise. 4. Check BP 1-2 x per week at home, keep log, and bring to clinic at next appointment. 5. Follow up 6 months.         Respiratory   COPD with acute exacerbation ([place]) - Primary    COPD with acute exacerbation.  Will treat with levaquin 500mg  daily x 7 days, prednisone taper over 9 days, promethazine DM cough medicine for nighttime use and has albuterol inhaler at home, not needing refill.  Strict ER precautions.  Plan: 1. Begin levaquin 500mg  daily x 7 days 2. Begin prednisone taper, as directed, over the next 9 days 3. Can take promethazine DM 44mL every 6 hours as needed for cough at night.  Discussed with daughter possible sedation with this medication and to avoid driving or operating heavy machinery while using this. 4. Can use albuterol inhaler 1-2 puffs every 4-6 hours as needed for cough/SOB/wheezing 5. Strict ER precautions provided 6. RTC PRN      Relevant Medications   predniSONE (DELTASONE) 10 MG tablet   levofloxacin (LEVAQUIN) 500 MG tablet   promethazine-dextromethorphan (PROMETHAZINE-DM) 6.25-15 MG/5ML syrup     Endocrine   Type 2 diabetes mellitus with other specified complication  ([place])    Well-controlledDM with A1c 6.6% improved from 7.0% on 07/10/2019 and goal A1c < 7.0%.  Currently managing with dietary modifications.  Plan:  1. Continue current therapy: lifestyle and dietary modifications 2. Encourage improved lifestyle: - low carb/low glycemic diet reinforced prior education - Increase physical activity to 30 minutes most days of the week.  Explained that increased physical activity increases body's use of sugar for energy. 3. Check fasting am CBG and log these.  Bring log to next visit for review 4. Continue ASA, ARB and Statin 5. Advised to schedule DM ophtho exam, send record. 6. Follow-up 6 months       Relevant Orders   POCT HgB A1C (Completed)     Other   Sinus pressure    Has been present approx 2 weeks, will cover with Levaquin for suspected bacterial sinus infection as using this to cover for COPD exacerbation.  Plan: 1. Take levaquin 500mg  daily x 7 days 2. RTC PRN         Meds ordered this encounter  Medications  . predniSONE (DELTASONE) 10 MG tablet    Sig: Take 3 tablets (30 mg total) by mouth daily with breakfast for 3 days, THEN 2 tablets (20 mg total) daily with breakfast for 3 days, THEN 1 tablet (10 mg total) daily with breakfast for 3 days.    Dispense:  18 tablet    Refill:  0  . levofloxacin (LEVAQUIN) 500 MG tablet    Sig: Take 1 tablet (500 mg total) by mouth daily.    Dispense:  7 tablet    Refill:  0  . promethazine-dextromethorphan (PROMETHAZINE-DM) 6.25-15 MG/5ML syrup    Sig: Take 5 mLs by mouth 4 (four) times daily as needed for cough.    Dispense:  118 mL    Refill:  0    Follow up plan: Return in about 3 months (around 04/14/2020) for T2DM, A1C, HTN F/U.   Harlin Rain, [place] [facility] Nurse Practitioner [facility] 01/13/2020, 2:28 PM

## 2020-01-13 NOTE — Assessment & Plan Note (Signed)
Has been present approx 2 weeks, will cover with Levaquin for suspected bacterial sinus infection as using this to cover for COPD exacerbation.  Plan: 1. Take levaquin 500mg  daily x 7 days 2. RTC PRN

## 2020-01-13 NOTE — Assessment & Plan Note (Signed)
Well-controlledDM with A1c 6.6% improved from 7.0% on 07/10/2019 and goal A1c < 7.0%.  Currently managing with dietary modifications.  Plan:  1. Continue current therapy: lifestyle and dietary modifications 2. Encourage improved lifestyle: - low carb/low glycemic diet reinforced prior education - Increase physical activity to 30 minutes most days of the week.  Explained that increased physical activity increases body's use of sugar for energy. 3. Check fasting am CBG and log these.  Bring log to next visit for review 4. Continue ASA, ARB and Statin 5. Advised to schedule DM ophtho exam, send record. 6. Follow-up 6 months

## 2020-01-13 NOTE — Patient Instructions (Addendum)
Your A1C improved from 7.0% to 6.6% today!  Keep up the good work!  Continue your medications as prescribed.  I have sent in a prescription for levaquin 500mg , to take 1 tablet daily for the next 7 days.  I have sent in a prescription for Prednisone to take as directed over the next 9 days  Can use the albuterol inhaler 1-2 puffs every 4-6 hours as needed for cough, wheezing or shortness of breath  I have sent in a prescription for Promethazine DM to take 65mL every 4-6 hours as needed for cough.  Be sure not to drive or operate heavy machinery while taking this medication, as it can cause sedation.  If you begin to have worsening shortness of breath, chest pain, fever over 104 that is not responsive to ibuprofen and/or acetaminophen, or impending sense of doom to [place]!  We will plan to see you back in 3 months for diabetes follow up visit and we will plan to see you back if your symptoms worsen or fail to improve  You will receive a survey after today's visit either digitally by e-mail or paper by USPS mail. Your experiences and feedback matter to [place].  Please respond so we know how we are doing as we provide care for you.  Call us with any questions/concerns/needs.  It is my goal to be available to you for your health concerns.  Thanks for choosing me to be a partner in your healthcare needs!  Harlin Rain, FNP-C Family Nurse Practitioner [facility] Phone: [phone]

## 2020-01-23 ENCOUNTER — Telehealth: Payer: Self-pay | Admitting: Family Medicine

## 2020-01-23 NOTE — Telephone Encounter (Signed)
I spoke with the patient daughter about her father request for a refill on his Promethazine cough syrup. I notified her of the providers recommendation. She said she will talk with her father and if he feels like his symptoms are worse or not improved, she will call back and schedule an appt.

## 2020-01-23 NOTE — Telephone Encounter (Signed)
Is not a long term medication, is used for acute illness for cough at night.  If having worsening of his COPD at night, would need another OV to discuss updating his Tx plan

## 2020-01-23 NOTE — Telephone Encounter (Signed)
Pt is requesting refill on  Promethazine-6.25-15 MG/ML said that  It helps with his coughing at night

## 2020-01-28 ENCOUNTER — Ambulatory Visit: Payer: Medicare Other | Admitting: Family Medicine

## 2020-01-29 ENCOUNTER — Other Ambulatory Visit: Payer: Self-pay

## 2020-01-29 ENCOUNTER — Ambulatory Visit
Admission: RE | Admit: 2020-01-29 | Discharge: 2020-01-29 | Disposition: A | Discharge status: Home / Self Care | Payer: Medicare Other | Source: Ambulatory Visit | Attending: Family Medicine | Admitting: Family Medicine

## 2020-01-29 ENCOUNTER — Ambulatory Visit (INDEPENDENT_AMBULATORY_CARE_PROVIDER_SITE_OTHER): Payer: Medicare Other | Admitting: Family Medicine

## 2020-01-29 ENCOUNTER — Encounter: Payer: Self-pay | Admitting: Family Medicine

## 2020-01-29 ENCOUNTER — Ambulatory Visit
Admission: RE | Admit: 2020-01-29 | Discharge: 2020-01-29 | Disposition: A | Discharge status: Home / Self Care | Payer: Medicare Other | Attending: Family Medicine | Admitting: Family Medicine

## 2020-01-29 VITALS — BP 132/54 | HR 83 | Temp 98.3°F | Resp 20 | Ht 60.0 in | Wt 103.4 lb

## 2020-01-29 DIAGNOSIS — J441 Chronic obstructive pulmonary disease with (acute) exacerbation: Secondary | ICD-10-CM | POA: Diagnosis not present

## 2020-01-29 DIAGNOSIS — R053 Chronic cough: Secondary | ICD-10-CM | POA: Diagnosis not present

## 2020-01-29 DIAGNOSIS — R059 Cough, unspecified: Secondary | ICD-10-CM | POA: Diagnosis not present

## 2020-01-29 DIAGNOSIS — J449 Chronic obstructive pulmonary disease, unspecified: Secondary | ICD-10-CM | POA: Diagnosis not present

## 2020-01-29 IMAGING — DX DG CHEST 2V
3 series · 3 of 3 positions shown · non-contrast
Comparison: Chest x-ray dated [DATE].

CLINICAL DATA: Chronic cough.  COPD exacerbation.

EXAM:
CHEST - 2 VIEW

[chest pa (1 of 2)]
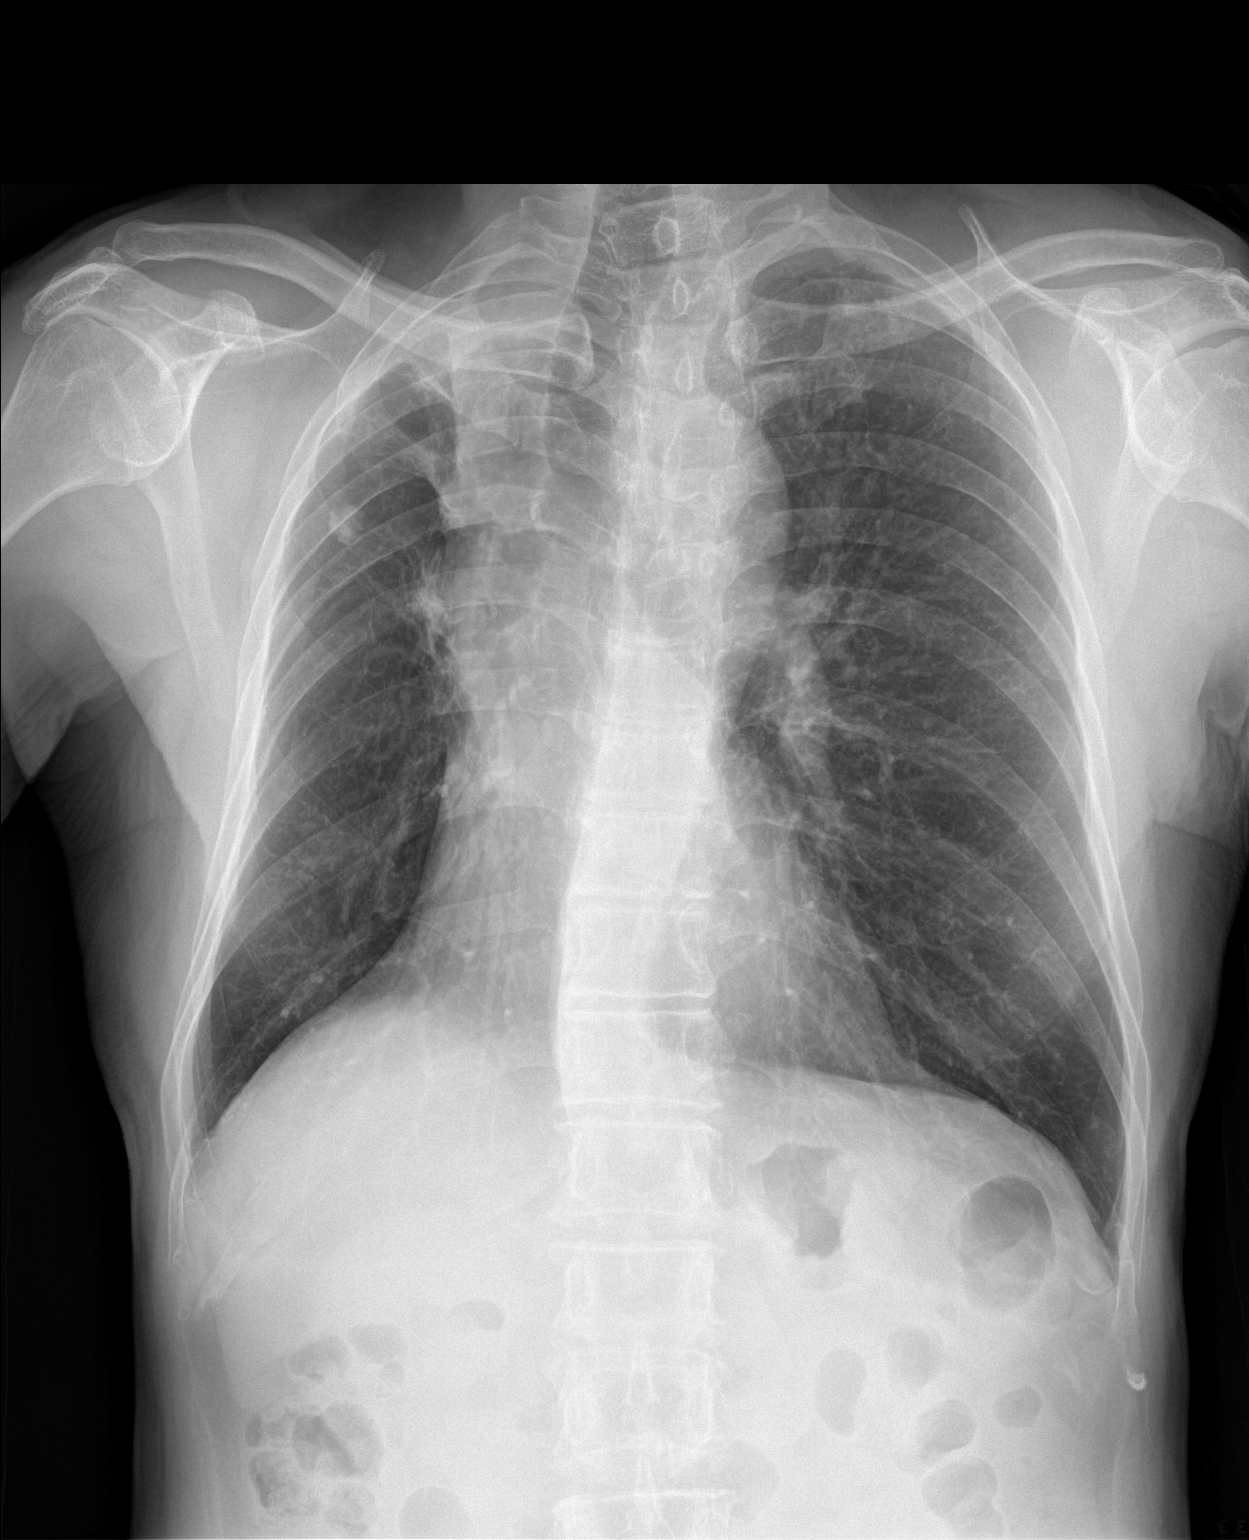

[chest lat]
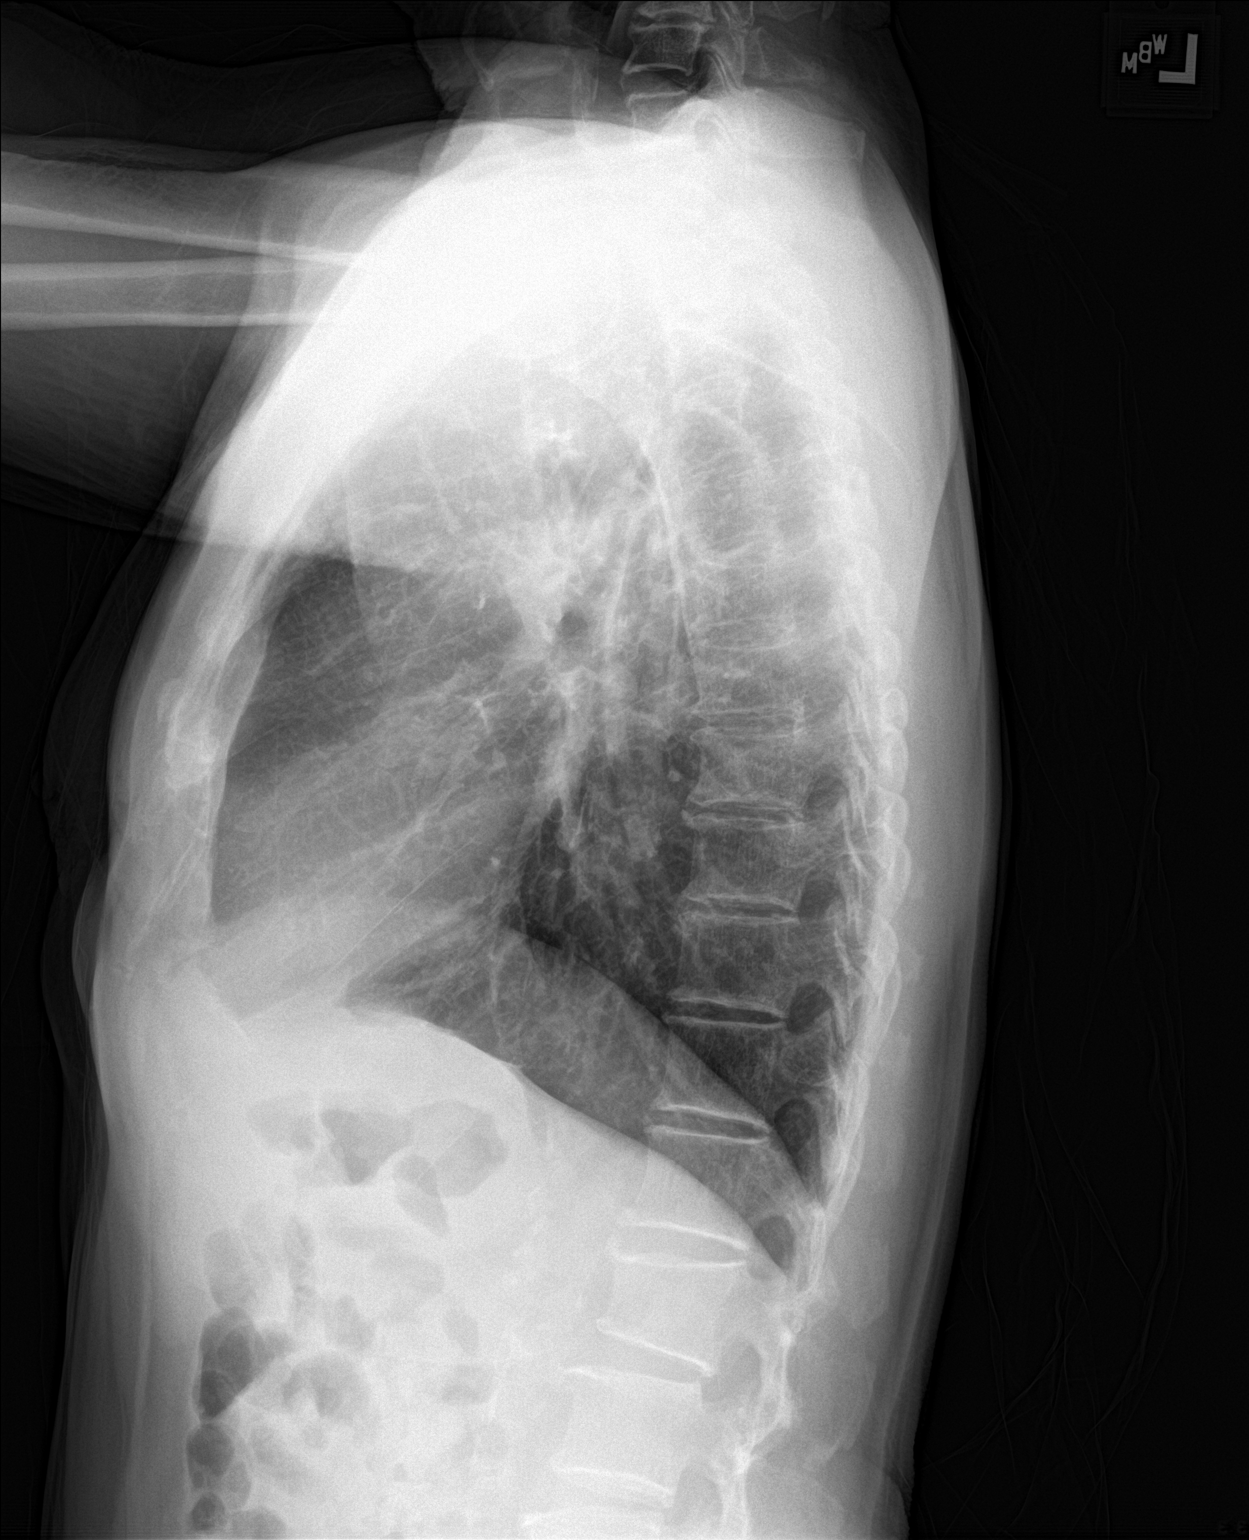

[chest pa (2 of 2)]
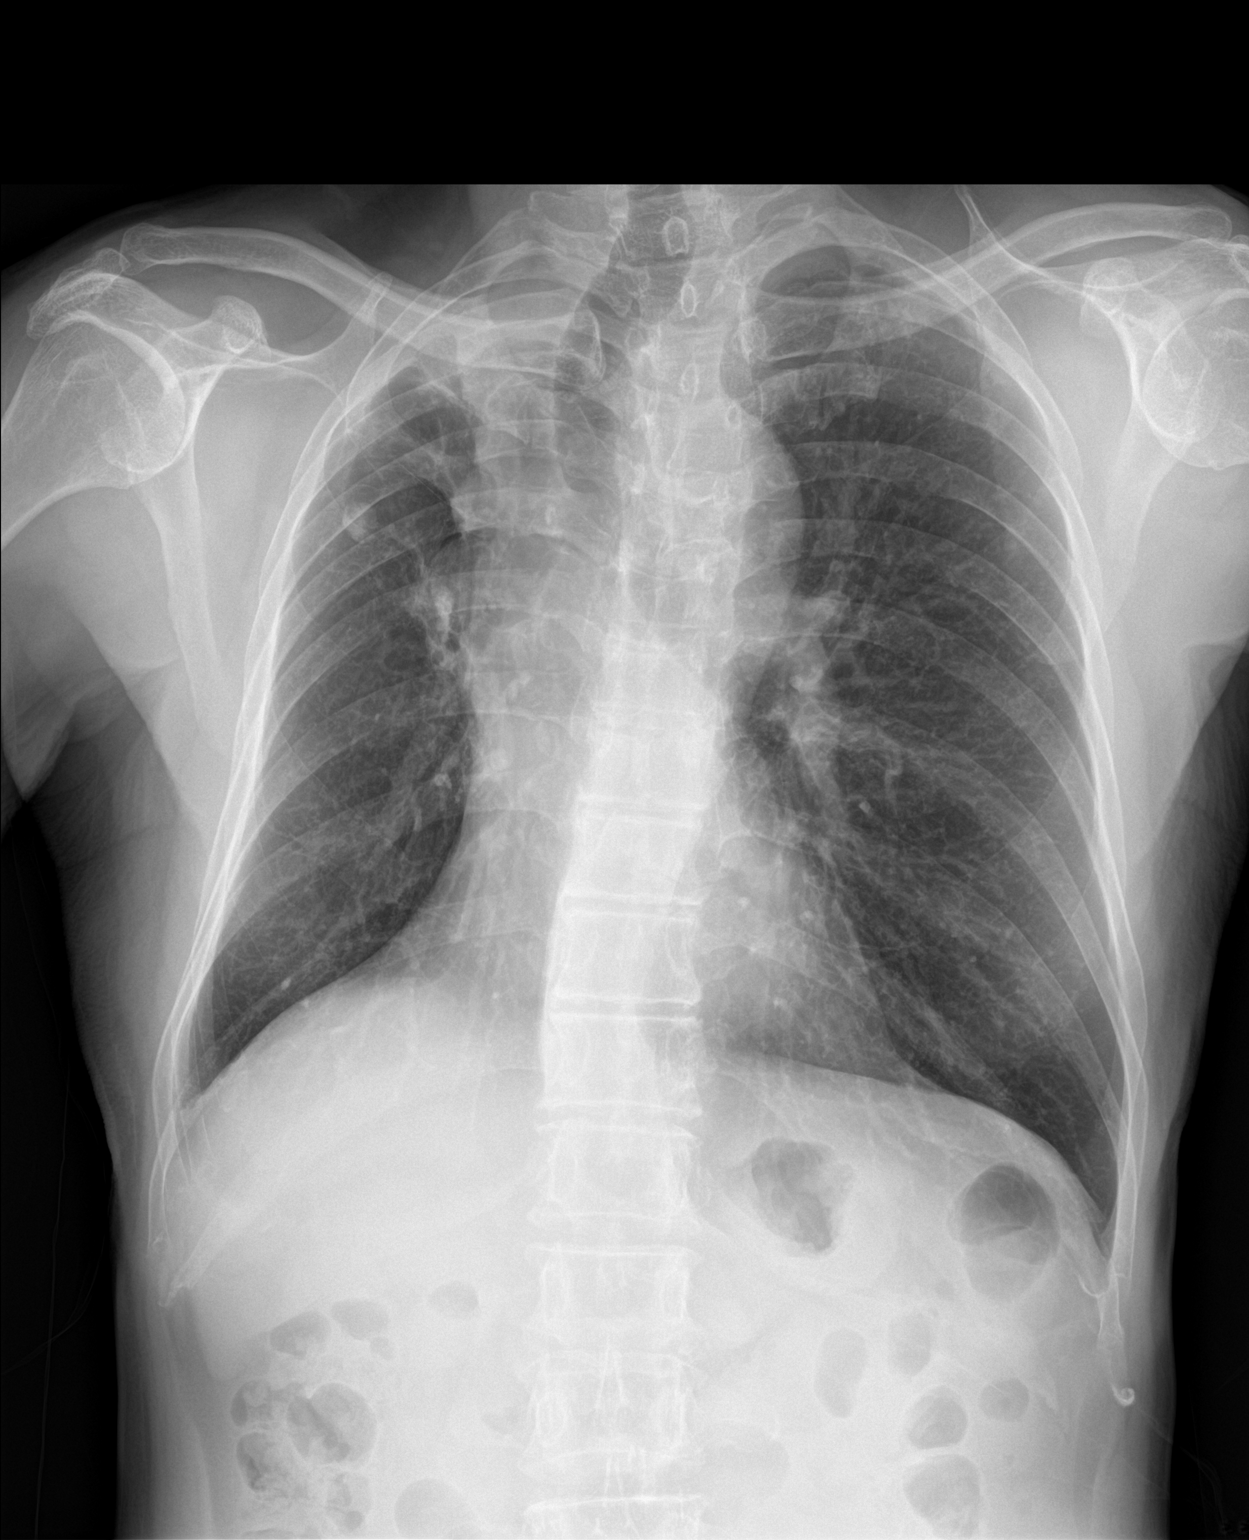

[3 of 3 positions shown; findings below may reference images not displayed]

FINDINGS: Normal heart size. Similar chronic scarring and volume loss in the
right upper lobe with rightward mediastinal shift. Unchanged
calcified granuloma in the right upper lobe. The lungs are otherwise
clear. No pleural effusion or pneumothorax. No acute osseous
abnormality.
IMPRESSION: 1. No acute cardiopulmonary disease.
2. Chronic changes in the right upper lobe.

## 2020-01-29 MED ORDER — PROMETHAZINE-DM 6.25-15 MG/5ML PO SYRP: 5.0000 mL | ORAL_SOLUTION | Freq: Four times a day (QID) | ORAL | 0 refills | Status: DC | PRN

## 2020-01-29 MED ORDER — PREDNISONE 10 MG PO TABS: ORAL_TABLET | ORAL | 0 refills | Status: AC

## 2020-01-29 MED ORDER — DOXYCYCLINE HYCLATE 100 MG PO TABS: 100.0000 mg | ORAL_TABLET | Freq: Two times a day (BID) | ORAL | 0 refills | Status: DC

## 2020-01-29 NOTE — Patient Instructions (Signed)
I have sent in a prescription for doxycycline to take 1 tablet 2x per day for the next 10 days.  I have sent in a prescription for prednisone to take 30mg  daily x 4 days, 20mg  daily x 4 days then 10mg  daily for 4 days  I have sent in a prescription for promethazine DM to take at night for cough.  Do not drive or operate heavy machinery while taking this medication as it can be sedating  Continue all of your other prescription medications as directed  A referral to Pulmonology has been placed today.  If you have not heard from the specialty office or our referral coordinator within 1 week, please let us know and we will follow up with the referral coordinator for an update.  We will plan to see you back if your symptoms worsen or fail to improve  You will receive a survey after today's visit either digitally by e-mail or paper by USPS mail. Your experiences and feedback matter to [place].  Please respond so we know how we are doing as we provide care for you.  Call us with any questions/concerns/needs.  It is my goal to be available to you for your health concerns.  Thanks for choosing me to be a partner in your healthcare needs!  Harlin Rain, FNP-C Family Nurse Practitioner [facility] Phone: [phone]

## 2020-01-29 NOTE — Progress Notes (Signed)
Subjective:    Patient ID: Jonathan Willis, male    DOB: 06-26-1945, 74 y.o.   MRN: [id number]  Jonathan Willis is a 74 y.o. male presenting on 01/29/2020 for Cough   HPI  Jonathan Willis presents to clinic with his daughter today, who is translating for him.  Reports he has some shortness of breath, productive coughing, post nasal drainage, cough that keeps him up at night with wheezing.  Reports he had some improvement when he was on levaquin and prednisone with his 01/13/2020 COPD exacerbation but once he completed the medications he got worse again.  Reports someone who lives with him was exposed to [place] so he and his entire home was tested 1 week ago and all tested negative.  Denies fevers, sore throat, change in taste/smell, CP, abdominal pain, n/v/d.  Hasn't taken anything for his symptoms.  Has continued to take his daily medications.  Depression screen [facility] 2/9 04/11/2019 06/05/2018 02/07/2017  Decreased Interest 0 0 0  Down, Depressed, Hopeless 0 0 0  PHQ - 2 Score 0 0 0    Social History   Tobacco Use  . Smoking status: Former Smoker    Packs/day: 1.50    Years: 44.00    Pack years: 66.00    Types: Cigarettes    Quit date: 01/11/2007    Years since quitting: 13.0  . Smokeless tobacco: Former Network engineer  . Vaping Use: Never used  Substance Use Topics  . Alcohol use: No  . Drug use: No    Review of Systems  Constitutional: Negative.   HENT: Positive for postnasal drip. Negative for congestion, dental problem, drooling, ear discharge, ear pain, facial swelling, hearing loss, mouth sores, nosebleeds, rhinorrhea, sinus pressure, sinus pain, sneezing, sore throat, tinnitus, trouble swallowing and voice change.   Eyes: Negative.   Respiratory: Positive for cough, shortness of breath and wheezing. Negative for apnea, choking, chest tightness and stridor.   Cardiovascular: Negative.   Gastrointestinal: Negative.   Endocrine: Negative.   Genitourinary: Negative.   Musculoskeletal:  Negative.   Skin: Negative.   Allergic/Immunologic: Negative.   Neurological: Negative.   Hematological: Negative.   Psychiatric/Behavioral: Positive for sleep disturbance. Negative for agitation, behavioral problems, confusion, decreased concentration, dysphoric mood, hallucinations, self-injury and suicidal ideas. The patient is not nervous/anxious and is not hyperactive.    Per HPI unless specifically indicated above     Objective:    BP (!) 132/54   Pulse 83   Temp 98.3 F (36.8 C)   Resp 20   Ht 5' (1.524 m)   Wt 103 lb 6.4 oz (46.9 kg)   SpO2 100%   BMI 20.19 kg/m   Wt Readings from Last 3 Encounters:  01/29/20 103 lb 6.4 oz (46.9 kg)  01/13/20 105 lb 6.4 oz (47.8 kg)  07/10/19 112 lb (50.8 kg)    Physical Exam Vitals and nursing note reviewed. Exam conducted with a chaperone present (Daughter).  Constitutional:      General: He is not in acute distress.    Appearance: Normal appearance. He is well-developed, well-groomed and normal weight. He is not ill-appearing or toxic-appearing.  HENT:     Head: Normocephalic and atraumatic.     Right Ear: Tympanic membrane, ear canal and external ear normal. There is no impacted cerumen.     Left Ear: Tympanic membrane, ear canal and external ear normal. There is no impacted cerumen.     Nose: Congestion and rhinorrhea present.  Comments: Lizbeth Bark is in place, covering mouth and nose.    Mouth/Throat:     Pharynx: No oropharyngeal exudate or posterior oropharyngeal erythema.  Eyes:     General:        Right eye: No discharge.        Left eye: No discharge.     Extraocular Movements: Extraocular movements intact.     Conjunctiva/sclera: Conjunctivae normal.     Pupils: Pupils are equal, round, and reactive to light.  Cardiovascular:     Rate and Rhythm: Normal rate and regular rhythm.     Pulses: Normal pulses.     Heart sounds: Normal heart sounds. No murmur heard.  No friction rub. No gallop.   Pulmonary:      Effort: Pulmonary effort is normal. Prolonged expiration present. No respiratory distress.     Breath sounds: No stridor. Examination of the right-upper field reveals wheezing. Examination of the left-upper field reveals wheezing. Examination of the right-lower field reveals decreased breath sounds. Examination of the left-lower field reveals decreased breath sounds. Decreased breath sounds and wheezing present. No rhonchi.     Comments: Decreased breath sounds at bilateral bases Musculoskeletal:     Right lower leg: No edema.     Left lower leg: No edema.  Lymphadenopathy:     Cervical: No cervical adenopathy.  Skin:    General: Skin is warm and dry.     Capillary Refill: Capillary refill takes less than 2 seconds.  Neurological:     General: No focal deficit present.     Mental Status: He is alert and oriented to person, place, and time.  Psychiatric:        Attention and Perception: Attention and perception normal.        Mood and Affect: Mood and affect normal.        Speech: Speech normal.        Behavior: Behavior normal. Behavior is cooperative.        Thought Content: Thought content normal.        Cognition and Memory: Cognition and memory normal.    Results for orders placed or performed in visit on 01/13/20  POCT HgB A1C  Result Value Ref Range   Hemoglobin A1C 6.6 (A) 4.0 - 5.6 %      Assessment & Plan:   Problem List Items Addressed This Visit      Respiratory   COPD with acute exacerbation ([place]) - Primary   Relevant Medications   promethazine-dextromethorphan (PROMETHAZINE-DM) 6.25-15 MG/5ML syrup   doxycycline (VIBRA-TABS) 100 MG tablet   predniSONE (DELTASONE) 10 MG tablet   Other Relevant Orders   DG Chest 2 View   Ambulatory referral to Pulmonology      Meds ordered this encounter  Medications  . promethazine-dextromethorphan (PROMETHAZINE-DM) 6.25-15 MG/5ML syrup    Sig: Take 5 mLs by mouth 4 (four) times daily as needed for cough.    Dispense:  118  mL    Refill:  0  . doxycycline (VIBRA-TABS) 100 MG tablet    Sig: Take 1 tablet (100 mg total) by mouth 2 (two) times daily.    Dispense:  20 tablet    Refill:  0  . predniSONE (DELTASONE) 10 MG tablet    Sig: Take 3 tablets (30 mg total) by mouth daily with breakfast for 4 days, THEN 2 tablets (20 mg total) daily with breakfast for 4 days, THEN 1 tablet (10 mg total) daily with breakfast for 4 days.  Dispense:  24 tablet    Refill:  0    Follow up plan: Return if symptoms worsen or fail to improve.   Harlin Rain, [place] [facility] Nurse Practition[facility] 01/29/2020, 10:13 AM

## 2020-01-31 ENCOUNTER — Telehealth: Payer: Self-pay

## 2020-01-31 DIAGNOSIS — J449 Chronic obstructive pulmonary disease, unspecified: Secondary | ICD-10-CM | POA: Diagnosis not present

## 2020-01-31 NOTE — Telephone Encounter (Signed)
Copied from [place] [id number]. Topic: General - Other >> Jan 31, 2020  8:50 AM Oneta Rack wrote: Reason for CRM:  patient daughter returning call regarding imaging results    The pt daughter was notified of her fathers chest xray result. She verbalize understanding.

## 2020-02-04 ENCOUNTER — Other Ambulatory Visit: Payer: Self-pay | Admitting: Family Medicine

## 2020-02-04 DIAGNOSIS — M542 Cervicalgia: Secondary | ICD-10-CM

## 2020-02-04 DIAGNOSIS — R519 Headache, unspecified: Secondary | ICD-10-CM

## 2020-02-04 NOTE — Telephone Encounter (Signed)
Requested medications are due for refill today?  Yes - This medication refill cannot be delegated.    Requested medications are on active medication list?  Yes  Last Refill:  01/13/2020  # 90 with no refills  Future visit scheduled?  Yes in 2 months  Notes to Clinic:   This medication refill cannot be delegated.

## 2020-02-11 ENCOUNTER — Ambulatory Visit: Payer: Medicare Other | Attending: Internal Medicine

## 2020-02-11 DIAGNOSIS — J449 Chronic obstructive pulmonary disease, unspecified: Secondary | ICD-10-CM | POA: Insufficient documentation

## 2020-02-11 DIAGNOSIS — G4761 Periodic limb movement disorder: Secondary | ICD-10-CM | POA: Diagnosis not present

## 2020-02-12 ENCOUNTER — Other Ambulatory Visit: Payer: Self-pay

## 2020-02-12 ENCOUNTER — Other Ambulatory Visit: Payer: Self-pay | Admitting: Family Medicine

## 2020-02-12 DIAGNOSIS — I10 Essential (primary) hypertension: Secondary | ICD-10-CM

## 2020-02-16 DIAGNOSIS — G4730 Sleep apnea, unspecified: Secondary | ICD-10-CM | POA: Diagnosis not present

## 2020-03-02 DIAGNOSIS — J449 Chronic obstructive pulmonary disease, unspecified: Secondary | ICD-10-CM | POA: Diagnosis not present

## 2020-03-06 ENCOUNTER — Other Ambulatory Visit: Payer: Self-pay | Admitting: Family Medicine

## 2020-03-06 DIAGNOSIS — R519 Headache, unspecified: Secondary | ICD-10-CM

## 2020-03-06 DIAGNOSIS — M542 Cervicalgia: Secondary | ICD-10-CM

## 2020-03-06 NOTE — Telephone Encounter (Signed)
Requested medication (s) are due for refill today:   Provider to determine  Requested medication (s) are on the active medication list:   Yes  Future visit scheduled:   Yes in 1 mo.   Last ordered: 02/04/2020 #90, 1 refill   Requested Prescriptions  Pending Prescriptions Disp Refills   baclofen (LIORESAL) 10 MG tablet [Pharmacy Med Name: BACLOFEN 10 MG TABLET] 90 tablet 1    Sig: TAKE 1 TABLET (10 MG TOTAL) BY MOUTH 3 (THREE) TIMES DAILY. AS NEEDED      Not Delegated - Analgesics:  Muscle Relaxants Failed - 03/06/2020  1:33 PM      Failed - This refill cannot be delegated      Passed - Valid encounter within last 6 months    Recent Outpatient Visits           1 month ago COPD with acute exacerbation ([place])   [place], FNP   1 month ago COPD with acute exacerbation [facility])   [facility], Lupita Raider, FNP   8 months ago Type 2 diabetes mellitus with other specified complication, without long-term current use of insulin [facility])   [facility], Lupita Raider, FNP   11 months ago COPD with acute exacerbation [facility])   [facility] Olin Hauser, DO   1 year ago COPD exacerbation [facility])   [facility] Merrilyn Puma, Jerrel Ivory, NP       Future Appointments             In 1 month [place], Lupita Raider, [facility], [facility]

## 2020-03-12 ENCOUNTER — Other Ambulatory Visit: Payer: Self-pay | Admitting: Family Medicine

## 2020-03-12 DIAGNOSIS — I10 Essential (primary) hypertension: Secondary | ICD-10-CM

## 2020-04-06 ENCOUNTER — Other Ambulatory Visit: Payer: Self-pay | Admitting: Family Medicine

## 2020-04-06 DIAGNOSIS — R350 Frequency of micturition: Secondary | ICD-10-CM

## 2020-04-08 ENCOUNTER — Other Ambulatory Visit: Payer: Self-pay | Admitting: Family Medicine

## 2020-04-08 DIAGNOSIS — R519 Headache, unspecified: Secondary | ICD-10-CM

## 2020-04-08 DIAGNOSIS — M542 Cervicalgia: Secondary | ICD-10-CM

## 2020-04-08 NOTE — Telephone Encounter (Signed)
Requested medication (s) are due for refill today: no  Requested medication (s) are on the active medication list: yes  Last refill:  03/06/2020  Future visit scheduled: yes  Notes to clinic:  this refill cannot be delegated   Requested Prescriptions  Pending Prescriptions Disp Refills   baclofen (LIORESAL) 10 MG tablet [Pharmacy Med Name: BACLOFEN 10 MG TABLET] 90 tablet 1    Sig: TAKE 1 TABLET (10 MG TOTAL) BY MOUTH 3 (THREE) TIMES DAILY. AS NEEDED      Not Delegated - Analgesics:  Muscle Relaxants Failed - 04/08/2020  1:30 PM      Failed - This refill cannot be delegated      Passed - Valid encounter within last 6 months    Recent Outpatient Visits           2 months ago COPD with acute exacerbation [facility])   [facility], Lupita Raider, FNP   2 months ago COPD with acute exacerbation [facility])   [facility], Lupita Raider, FNP   9 months ago Type 2 diabetes mellitus with other specified complication, without long-term current use of insulin [facility])   [facility], Lupita Raider, FNP   12 months ago COPD with acute exacerbation [facility])   [facility] Olin Hauser, DO   1 year ago COPD exacerbation [facility])   [facility] Merrilyn Puma, Jerrel Ivory, NP       Future Appointments             In 1 week Malf[place], Lupita Raider, [facility], [facility]

## 2020-04-16 ENCOUNTER — Encounter: Payer: Self-pay | Admitting: Family Medicine

## 2020-04-16 ENCOUNTER — Ambulatory Visit (INDEPENDENT_AMBULATORY_CARE_PROVIDER_SITE_OTHER): Payer: Medicare Other | Admitting: Family Medicine

## 2020-04-16 ENCOUNTER — Other Ambulatory Visit: Payer: Self-pay

## 2020-04-16 VITALS — BP 123/68 | HR 73 | Temp 97.5°F | Resp 18 | Ht 60.0 in | Wt 108.6 lb

## 2020-04-16 DIAGNOSIS — E1169 Type 2 diabetes mellitus with other specified complication: Secondary | ICD-10-CM | POA: Diagnosis not present

## 2020-04-16 DIAGNOSIS — I10 Essential (primary) hypertension: Secondary | ICD-10-CM | POA: Diagnosis not present

## 2020-04-16 DIAGNOSIS — J441 Chronic obstructive pulmonary disease with (acute) exacerbation: Secondary | ICD-10-CM | POA: Diagnosis not present

## 2020-04-16 LAB — POCT GLYCOSYLATED HEMOGLOBIN (HGB A1C): Hemoglobin A1C: 6.5 % — AB (ref 4.0–5.6)

## 2020-04-16 MED ORDER — TRELEGY ELLIPTA 100-62.5-25 MCG/INH IN AEPB: 1.0000 | INHALATION_SPRAY | Freq: Every day | RESPIRATORY_TRACT | 5 refills | Status: DC

## 2020-04-16 MED ORDER — PREDNISONE 20 MG PO TABS: ORAL_TABLET | ORAL | 0 refills | Status: AC

## 2020-04-16 NOTE — Assessment & Plan Note (Signed)
Well-controlledDM with A1c 6.5% improved from 6.6% on 01/13/2020 and goal A1c < 7.0%.  Currently managing with dietary modifications.  Plan:  1. Currently managing with lifestyle and dietary modifications 2. Encourage improved lifestyle: - low carb/low glycemic diet reinforced prior education - Increase physical activity to 30 minutes most days of the week.  Explained that increased physical activity increases body's use of sugar for energy. 3. Check fasting am CBG and log these.  Bring log to next visit for review 4. Continue ASA, ARB and Statin 5. Advised to schedule DM ophtho exam, send record. 6. Follow-up 6 months

## 2020-04-16 NOTE — Assessment & Plan Note (Signed)
Controlled hypertension.  BP is at goal < 130/80.  Pt reports working on lifestyle modifications.  Taking medications tolerating well without side effects.   Plan: 1. Continue taking hydrochlorothiazide 12.5mg  and telmisartan 40mg  daily, and metoprolol tartrate 50mg  BID 2. Obtain labs at next visit  3. Encouraged heart healthy diet and increasing exercise to 30 minutes most days of the week, going no more than 2 days in a row without exercise. 4. Check BP 1-2 x per week at home, keep log, and bring to clinic at next appointment. 5. Follow up 6 months.

## 2020-04-16 NOTE — Assessment & Plan Note (Signed)
Currently following with [facility] for pulmonology, recently started on home oxygen.  Bilateral upper lobe inspiratory/expiratory wheezing.  Will start on prednisone taper over the next 9 days and encouraged to follow up with pulmonary for re-evaluation.

## 2020-04-16 NOTE — Progress Notes (Signed)
Subjective:    Patient ID: Jonathan Willis, male    DOB: 1946-03-07, 75 y.o.   MRN: [id number]  Jonathan Willis is a 75 y.o. male presenting on 04/16/2020 for Diabetes and COPD (Chronic cough w/ some throat irritation from the cough.)   HPI  Jonathan Willis presents to clinic for a follow up on his hypertension, diabetes and COPD.  He is here today with his daughter, who translates for him.  Reports that he has been taking and tolerating his medications with good control over his diabetes and hypertension.  Has recently met with pulmonology and has been put on oxygen, is not wearing oxygen to clinic today.  Reports symptom improvement with oxygen.  Has some shortness of breath with increased activity, feels short of breath at rest today in our clinic.  Diabetes Pt presents today for follow up Type 2 Diabetes Mellitus.  He/she (caps): He ACTION; IS/IS NOT: is not checking AM CBG at home. -Current diabetic medications include: lifestyle and dietary modifications -ACTION; IS/IS NOT: is not currently symptomatic -Actions; denies/reports/admits to: denies polydipsia, polyphagia, polyuria, headaches, diaphoresis, shakiness, chills, pain, numbness or tingling in extremities or changes in vision -Clinical course has been stable -Reports no exercise routine -Diet is moderate in salt, moderate in fat, and moderate in carbohydrates  PREVENTION Eye exam current (within 1 year) Due, encouraged to schedule Foot exam current (within 1 year) Due, to complete at next visit Lipid/ASCVD risk reduction - on statin: YES/NO: Yes  Kidney Protection (On ACE/ARB)? YES/NO: Yes   Hypertension - He is not checking BP at home or outside of clinic.    - Current medications: hydrochlorothiazide 12.7m and telmisartan 457mdaily, and metoprolol tartrate 5032mID, tolerating well without side effects - He is not currently symptomatic. - Pt denies headache, lightheadedness, dizziness, changes in vision, chest tightness/pressure,  palpitations, leg swelling, sudden loss of speech or loss of consciousness. - He  reports no regular exercise routine. - His diet is moderate in salt, moderate in fat, and moderate in carbohydrates.  [facility] 04/16/2020 04/11/2019 06/05/2018  Decreased Interest 0 0 0  Down, Depressed, Hopeless 0 0 0  PHQ - 2 Score 0 0 0    Social History   Tobacco Use  . Smoking status: Former Smoker    Packs/day: 1.50    Years: 44.00    Pack years: 66.00    Types: Cigarettes    Quit date: 01/11/2007    Years since quitting: 13.2  . Smokeless tobacco: Former UseNetwork engineer Vaping Use: Never used  Substance Use Topics  . Alcohol use: No  . Drug use: No    Review of Systems  Constitutional: Negative.   HENT: Negative.   Eyes: Negative.   Respiratory: Positive for cough, shortness of breath and wheezing. Negative for apnea, choking, chest tightness and stridor.   Cardiovascular: Negative.   Gastrointestinal: Negative.   Endocrine: Negative.   Genitourinary: Negative.   Musculoskeletal: Negative.   Skin: Negative.   Allergic/Immunologic: Negative.   Neurological: Negative.   Hematological: Negative.   Psychiatric/Behavioral: Negative.    Per HPI unless specifically indicated above     Objective:    BP 123/68 (BP Location: Right Arm, Patient Position: Sitting, Cuff Size: Normal)   Pulse 73   Temp (!) 97.5 F (36.4 C) (Temporal)   Resp 18   Ht 5' (1.524 m)   Wt 108 lb 9.6 oz (49.3 kg)   SpO2 100%   BMI  21.21 kg/m   Wt Readings from Last 3 Encounters:  04/16/20 108 lb 9.6 oz (49.3 kg)  01/29/20 103 lb 6.4 oz (46.9 kg)  01/13/20 105 lb 6.4 oz (47.8 kg)    Physical Exam Vitals and nursing note reviewed.  Constitutional:      General: He is not in acute distress.    Appearance: Normal appearance. He is well-developed, well-groomed and normal weight. He is not ill-appearing or toxic-appearing.  HENT:     Head: Normocephalic and atraumatic.     Nose:      Comments: Lizbeth Bark is in place, covering mouth and nose. Eyes:     General:        Right eye: No discharge.        Left eye: No discharge.     Extraocular Movements: Extraocular movements intact.     Conjunctiva/sclera: Conjunctivae normal.     Pupils: Pupils are equal, round, and reactive to light.  Cardiovascular:     Rate and Rhythm: Normal rate and regular rhythm.     Pulses: Normal pulses.     Heart sounds: Normal heart sounds. No murmur heard. No friction rub. No gallop.   Pulmonary:     Effort: Pulmonary effort is normal. No respiratory distress.     Breath sounds: Examination of the right-upper field reveals wheezing. Examination of the left-upper field reveals wheezing. Wheezing present.     Comments: Inspiratory and expiratory wheezing Musculoskeletal:     Right lower leg: No edema.     Left lower leg: No edema.  Skin:    General: Skin is warm and dry.     Capillary Refill: Capillary refill takes less than 2 seconds.  Neurological:     General: No focal deficit present.     Mental Status: He is alert and oriented to person, place, and time.  Psychiatric:        Attention and Perception: Attention and perception normal.        Mood and Affect: Mood and affect normal.        Speech: Speech normal.        Behavior: Behavior normal. Behavior is cooperative.        Thought Content: Thought content normal.        Cognition and Memory: Cognition and memory normal.    Results for orders placed or performed in visit on 04/16/20  POCT glycosylated hemoglobin (Hb A1C)  Result Value Ref Range   Hemoglobin A1C 6.5 (A) 4.0 - 5.6 %   HbA1c POC (<> result, manual entry)     HbA1c, POC (prediabetic range)     HbA1c, POC (controlled diabetic range)        Assessment & Plan:   Problem List Items Addressed This Visit      Cardiovascular and Mediastinum   Essential hypertension    Controlled hypertension.  BP is at goal < 130/80.  Pt reports working on lifestyle modifications.   Taking medications tolerating well without side effects.   Plan: 1. Continue taking hydrochlorothiazide 12.37m and telmisartan 428mdaily, and metoprolol tartrate 501mID 2. Obtain labs at next visit  3. Encouraged heart healthy diet and increasing exercise to 30 minutes most days of the week, going no more than 2 days in a row without exercise. 4. Check BP 1-2 x per week at home, keep log, and bring to clinic at next appointment. 5. Follow up 6 months.         Respiratory   COPD with  acute exacerbation [facility])    Currently following with [facility], recently started on home oxygen.  Bilateral upper lobe inspiratory/expiratory wheezing.  Will start on prednisone taper over the next 9 days and encouraged to follow up with pulmonary for re-evaluation.      Relevant Medications   Fluticasone-Umeclidin-Vilant (TRELEGY ELLIPTA) 100-62.5-25 MCG/INH AEPB   predniSONE (DELTASONE) 20 MG tablet     Endocrine   Type 2 diabetes mellitus with other specified complication ([place]) - Primary    Well-controlledDM with A1c 6.5% improved from 6.6% on 01/13/2020 and goal A1c < 7.0%.  Currently managing with dietary modifications.  Plan:  1. Currently managing with lifestyle and dietary modifications 2. Encourage improved lifestyle: - low carb/low glycemic diet reinforced prior education - Increase physical activity to 30 minutes most days of the week.  Explained that increased physical activity increases body's use of sugar for energy. 3. Check fasting am CBG and log these.  Bring log to next visit for review 4. Continue ASA, ARB and Statin 5. Advised to schedule DM ophtho exam, send record. 6. Follow-up 6 months      Relevant Orders   POCT glycosylated hemoglobin (Hb A1C) (Completed)    Other Visit Diagnoses    COPD exacerbation ([place])       Relevant Medications   Fluticasone-Umeclidin-Vilant (TRELEGY ELLIPTA) 100-62.5-25 MCG/INH AEPB   predniSONE (DELTASONE) 20 MG tablet       Meds ordered this encounter  Medications  . Fluticasone-Umeclidin-Vilant (TRELEGY ELLIPTA) 100-62.5-25 MCG/INH AEPB    Sig: Inhale 1 puff into the lungs daily.    Dispense:  30 each    Refill:  5  . predniSONE (DELTASONE) 20 MG tablet    Sig: Take 3 tablets (60 mg total) by mouth daily with breakfast for 3 days, THEN 2 tablets (40 mg total) daily with breakfast for 3 days, THEN 1 tablet (20 mg total) daily with breakfast for 3 days.    Dispense:  18 tablet    Refill:  0   Follow up plan: Return in about 6 months (around 10/14/2020) for HTN, T2DM, A1C, COPD F/U.  Harlin Rain, [place] [facility] Nurse Practitioner [facility] 04/16/2020, 9:41 AM

## 2020-04-16 NOTE — Patient Instructions (Signed)
I have sent in a refill on your Trelegy inhaler  I have also sent in a 9 day prednisone taper to help with your COPD exacerbation.  I would encourage you to contact your pulmonology provider and schedule an appointment for re-evaluation  Continue all medications as prescribed  Try to get exercise a minimum of 30 minutes per day at least 5 days per week as well as  adequate water intake all while measuring blood pressure a few times per week.  Keep a blood pressure log and bring back to clinic at your next visit.  If your readings are consistently over 130/80 to contact our office/send me a MyChart message and we will see you sooner.  Can try DASH and Mediterranean diet options, avoiding processed foods, lowering sodium intake, avoiding pork products, and eating a plant based diet for optimal health.  You can learn more information online about your diabetes at American Diabetes Association: http://www.diabetes.org/ - General self-care (diet, medications, blood sugar checks). - Diet recommendations - There are even recipes available for you to look at and try.  We will plan to see you back in 6 months for diabetes, hypertension and COPD follow up visit  You will receive a survey after today's visit either digitally by e-mail or paper by [place] mail. Your experiences and feedback matter to [place].  Please respond so we know how we are doing as we provide care for you.  Call us with any questions/concerns/needs.  It is my goal to be available to you for your health concerns.  Thanks for choosing me to be a partner in your healthcare needs!  Harlin Rain, FNP-C Family Nurse Practitioner [facility] Phone: [phone]

## 2020-06-02 DIAGNOSIS — Z01818 Encounter for other preprocedural examination: Secondary | ICD-10-CM | POA: Diagnosis not present

## 2020-06-02 DIAGNOSIS — J449 Chronic obstructive pulmonary disease, unspecified: Secondary | ICD-10-CM | POA: Diagnosis not present

## 2020-06-15 ENCOUNTER — Ambulatory Visit (INDEPENDENT_AMBULATORY_CARE_PROVIDER_SITE_OTHER): Payer: Medicare Other | Admitting: Family Medicine

## 2020-06-15 ENCOUNTER — Other Ambulatory Visit: Payer: Self-pay

## 2020-06-15 DIAGNOSIS — J441 Chronic obstructive pulmonary disease with (acute) exacerbation: Secondary | ICD-10-CM

## 2020-06-15 MED ORDER — PREDNISONE 20 MG PO TABS: ORAL_TABLET | ORAL | 0 refills | Status: DC

## 2020-06-15 MED ORDER — LEVOFLOXACIN 500 MG PO TABS: 500.0000 mg | ORAL_TABLET | Freq: Every day | ORAL | 0 refills | Status: DC

## 2020-06-15 NOTE — Patient Instructions (Addendum)
Thank you for coming to the office today.  7 day treatment Antibiotic Levaquin and Prednisone higher dose 7 day course, (STOP the low dose 5mg  one given by Dr A for now)  After 7 days if not much better call Dr A office and check with them, may need hospital if not improving.  Please schedule a Follow-up Appointment to: Return if symptoms worsen or fail to improve.  If you have any other questions or concerns, please feel free to call the office or send a message through [place]. You may also schedule an earlier appointment if necessary.  Additionally, you may be receiving a survey about your experience at our office within a few days to 1 week by e-mail or mail. We value your feedback.  Nobie Putnam, DO [place]

## 2020-06-15 NOTE — Progress Notes (Signed)
Subjective:    Patient ID: Jonathan Willis, male    DOB: [birth year], 75 y.o.   MRN: [id number]  Jonathan Willis is a 75 y.o. male presenting on 06/15/2020 for COPD  Prior PCP Jonathan Skeeters, FNP and here with family to help.  HPI  Followed by Pulmonology Dr Jonathan Willis Recently followed by Pulmonologist on 06/02/20 for acute COPD exacerbation Had X-ray see below, and had PFT 2021. He has supplemental oxygen if active and walking on exertion and wears it overnight. He continues inhalers Albuterol PRN every 6 hour, and Trelegy once daily He was given Prednisone 5mg  daily for 2 weeks and Fluconazole for thrush. Did not get significant relief. No recent antibiotics for lungs. Admits dyspnea, wheezing, cough Denies fever chills, body aches chest pain   Depression screen [facility] 2/9 04/16/2020 04/11/2019 06/05/2018  Decreased Interest 0 0 0  Down, Depressed, Hopeless 0 0 0  PHQ - 2 Score 0 0 0    Social History   Tobacco Use  . Smoking status: Former Smoker    Packs/day: 1.50    Years: 44.00    Pack years: 66.00    Types: Cigarettes    Quit date: 01/11/2007    Years since quitting: 13.4  . Smokeless tobacco: Former Network engineer  . Vaping Use: Never used  Substance Use Topics  . Alcohol use: No  . Drug use: No    Review of Systems Per HPI unless specifically indicated above     Objective:    BP 139/71   Pulse 88   Ht 5' (1.524 m)   Wt 106 lb 9.6 oz (48.4 kg)   SpO2 96%   BMI 20.82 kg/m   Wt Readings from Last 3 Encounters:  06/15/20 106 lb 9.6 oz (48.4 kg)  04/16/20 108 lb 9.6 oz (49.3 kg)  01/29/20 103 lb 6.4 oz (46.9 kg)    Physical Exam Vitals and nursing note reviewed.  Constitutional:      General: He is not in acute distress.    Appearance: He is well-developed. He is not diaphoretic.     Comments: Well-appearing, comfortable, cooperative  HENT:     Head: Normocephalic and atraumatic.  Eyes:     General:        Right eye: No discharge.        Left eye: No discharge.      Conjunctiva/sclera: Conjunctivae normal.  Neck:     Thyroid: No thyromegaly.  Cardiovascular:     Rate and Rhythm: Normal rate and regular rhythm.     Heart sounds: Normal heart sounds. No murmur heard.   Pulmonary:     Effort: Pulmonary effort is normal. No respiratory distress.     Breath sounds: Wheezing (diffuse end exp wheezing) present. No rales.     Comments: coughing spells Musculoskeletal:        General: Normal range of motion.     Cervical back: Normal range of motion and neck supple.  Lymphadenopathy:     Cervical: No cervical adenopathy.  Skin:    General: Skin is warm and dry.     Findings: No erythema or rash.  Neurological:     Mental Status: He is alert and oriented to person, place, and time.  Psychiatric:        Behavior: Behavior normal.     Comments: Well groomed, good eye contact, normal speech and thoughts      I have personally reviewed the radiology report from CXR on 06/02/20.  COMPARISON:Chest radiograph performed 01/21/2017, and CT of the  chest performed 02/01/2014   FINDINGS:  The lungs are well-aerated. Scarring is again noted at the right  lung apex, with chronic volume loss. There is no evidence of pleural  effusion or pneumothorax.   The cardiomediastinal silhouette is within normal limits. No acute  osseous abnormalities are seen. There is stable chronic prominence  of the right paratracheal soft tissues, reflecting normal  vasculature.   IMPRESSION:  Chronic changes at the right lung apex. No acute cardiopulmonary  process seen.   Electronically Signed  ByAmanda Willis M.D.  On: 06/03/2017 02:17    Results for orders placed or performed in visit on 04/16/20  POCT glycosylated hemoglobin (Hb A1C)  Result Value Ref Range   Hemoglobin A1C 6.5 (A) 4.0 - 5.6 %   HbA1c POC (<> result, manual entry)     HbA1c, POC (prediabetic range)     HbA1c, POC (controlled diabetic range)        Assessment & Plan:   Problem  List Items Addressed This Visit   None   Visit Diagnoses    COPD exacerbation (HCC)   (Chronic)     Relevant Medications   ipratropium-albuterol (DUONEB) 0.5-2.5 (3) MG/3ML SOLN   levofloxacin (LEVAQUIN) 500 MG tablet   predniSONE (DELTASONE) 20 MG tablet      Consistent with moderate acute exacerbation of COPD with worsening productive cough. Similar to prior exacerbations.  Pulse ox at rest 96% on RA  Plan: 1. Start taking Levaquin antibiotic 500mg  daily x 7 days 2. Start higher dose Prednisone burst taper 7 days, stop the 5mg  daily for now then resume 2. Use albuterol q 4 hr regularly x 2-3 days. Continue maintenance inhalers - Trelegy  F/u with [facility] Pulmonology Dr Jonathan Willis if not improved still. Next option may need more urgent hospital / ED eval if not improved   Meds ordered this encounter  Medications  . levofloxacin (LEVAQUIN) 500 MG tablet    Sig: Take 1 tablet (500 mg total) by mouth daily. For 7 days    Dispense:  7 tablet    Refill:  0  . predniSONE (DELTASONE) 20 MG tablet    Sig: Take daily with food. Start with 60mg  (3 pills) x 2 days, then reduce to 40mg  (2 pills) x 2 days, then 20mg  (1 pill) x 3 days    Dispense:  13 tablet    Refill:  0      Follow up plan: Return if symptoms worsen or fail to improve.   Jonathan Willis, [facility] 06/15/2020, 10:55 AM

## 2020-07-20 ENCOUNTER — Emergency Department: Payer: Medicare Other

## 2020-07-20 ENCOUNTER — Emergency Department
Admission: EM | Admit: 2020-07-20 | Discharge: 2020-07-20 | Disposition: A | Discharge status: Home / Self Care | Payer: Medicare Other | Attending: Emergency Medicine | Admitting: Emergency Medicine

## 2020-07-20 ENCOUNTER — Other Ambulatory Visit: Payer: Self-pay

## 2020-07-20 DIAGNOSIS — Z79899 Other long term (current) drug therapy: Secondary | ICD-10-CM | POA: Diagnosis not present

## 2020-07-20 DIAGNOSIS — R197 Diarrhea, unspecified: Secondary | ICD-10-CM | POA: Insufficient documentation

## 2020-07-20 DIAGNOSIS — Z7982 Long term (current) use of aspirin: Secondary | ICD-10-CM | POA: Insufficient documentation

## 2020-07-20 DIAGNOSIS — R072 Precordial pain: Secondary | ICD-10-CM | POA: Diagnosis present

## 2020-07-20 DIAGNOSIS — J441 Chronic obstructive pulmonary disease with (acute) exacerbation: Secondary | ICD-10-CM | POA: Diagnosis not present

## 2020-07-20 DIAGNOSIS — R059 Cough, unspecified: Secondary | ICD-10-CM | POA: Diagnosis not present

## 2020-07-20 DIAGNOSIS — E119 Type 2 diabetes mellitus without complications: Secondary | ICD-10-CM | POA: Diagnosis not present

## 2020-07-20 DIAGNOSIS — I10 Essential (primary) hypertension: Secondary | ICD-10-CM | POA: Insufficient documentation

## 2020-07-20 DIAGNOSIS — Z87891 Personal history of nicotine dependence: Secondary | ICD-10-CM | POA: Insufficient documentation

## 2020-07-20 DIAGNOSIS — J449 Chronic obstructive pulmonary disease, unspecified: Secondary | ICD-10-CM | POA: Diagnosis not present

## 2020-07-20 DIAGNOSIS — R1011 Right upper quadrant pain: Secondary | ICD-10-CM | POA: Diagnosis not present

## 2020-07-20 DIAGNOSIS — R109 Unspecified abdominal pain: Secondary | ICD-10-CM | POA: Diagnosis not present

## 2020-07-20 LAB — URINALYSIS, COMPLETE (UACMP) WITH MICROSCOPIC
Bacteria, UA: NONE SEEN
Bilirubin Urine: NEGATIVE
Glucose, UA: 150 mg/dL — AB
Hgb urine dipstick: NEGATIVE
Ketones, ur: NEGATIVE mg/dL
Leukocytes,Ua: NEGATIVE
Nitrite: NEGATIVE
Protein, ur: NEGATIVE mg/dL
Specific Gravity, Urine: 1.006 (ref 1.005–1.030)
pH: 6 (ref 5.0–8.0)

## 2020-07-20 LAB — COMPREHENSIVE METABOLIC PANEL
ALT: 35 U/L (ref 0–44)
AST: 38 U/L (ref 15–41)
Albumin: 4.2 g/dL (ref 3.5–5.0)
Alkaline Phosphatase: 65 U/L (ref 38–126)
Anion gap: 8 (ref 5–15)
BUN: 15 mg/dL (ref 8–23)
CO2: 26 mmol/L (ref 22–32)
Calcium: 8.8 mg/dL — ABNORMAL LOW (ref 8.9–10.3)
Chloride: 103 mmol/L (ref 98–111)
Creatinine, Ser: 0.88 mg/dL (ref 0.61–1.24)
GFR, Estimated: >60 mL/min (ref >60)
Glucose, Bld: 145 mg/dL — ABNORMAL HIGH (ref 70–99)
Potassium: 3.7 mmol/L (ref 3.5–5.1)
Sodium: 137 mmol/L (ref 135–145)
Total Bilirubin: 0.8 mg/dL (ref 0.3–1.2)
Total Protein: 7.5 g/dL (ref 6.5–8.1)

## 2020-07-20 LAB — CBC
HCT: 41.8 % (ref 39.0–52.0)
Hemoglobin: 14.1 g/dL (ref 13.0–17.0)
MCH: 31.8 pg (ref 26.0–34.0)
MCHC: 33.7 g/dL (ref 30.0–36.0)
MCV: 94.4 fL (ref 80.0–100.0)
Platelets: 251 10*3/uL (ref 150–400)
RBC: 4.43 MIL/uL (ref 4.22–5.81)
RDW: 12.6 % (ref 11.5–15.5)
WBC: 6.7 10*3/uL (ref 4.0–10.5)
nRBC: 0 % (ref 0.0–0.2)

## 2020-07-20 LAB — LIPASE, BLOOD: Lipase: 44 U/L (ref 11–51)

## 2020-07-20 LAB — TROPONIN I (HIGH SENSITIVITY)
Troponin I (High Sensitivity): 4 ng/L (ref <18)
Troponin I (High Sensitivity): 3 ng/L (ref <18)

## 2020-07-20 IMAGING — CR DG CHEST 2V
2 series · 2 of 2 positions shown · non-contrast
Comparison: [DATE]

CLINICAL DATA: COPD with productive cough

EXAM:
CHEST - 2 VIEW

[chest lat]
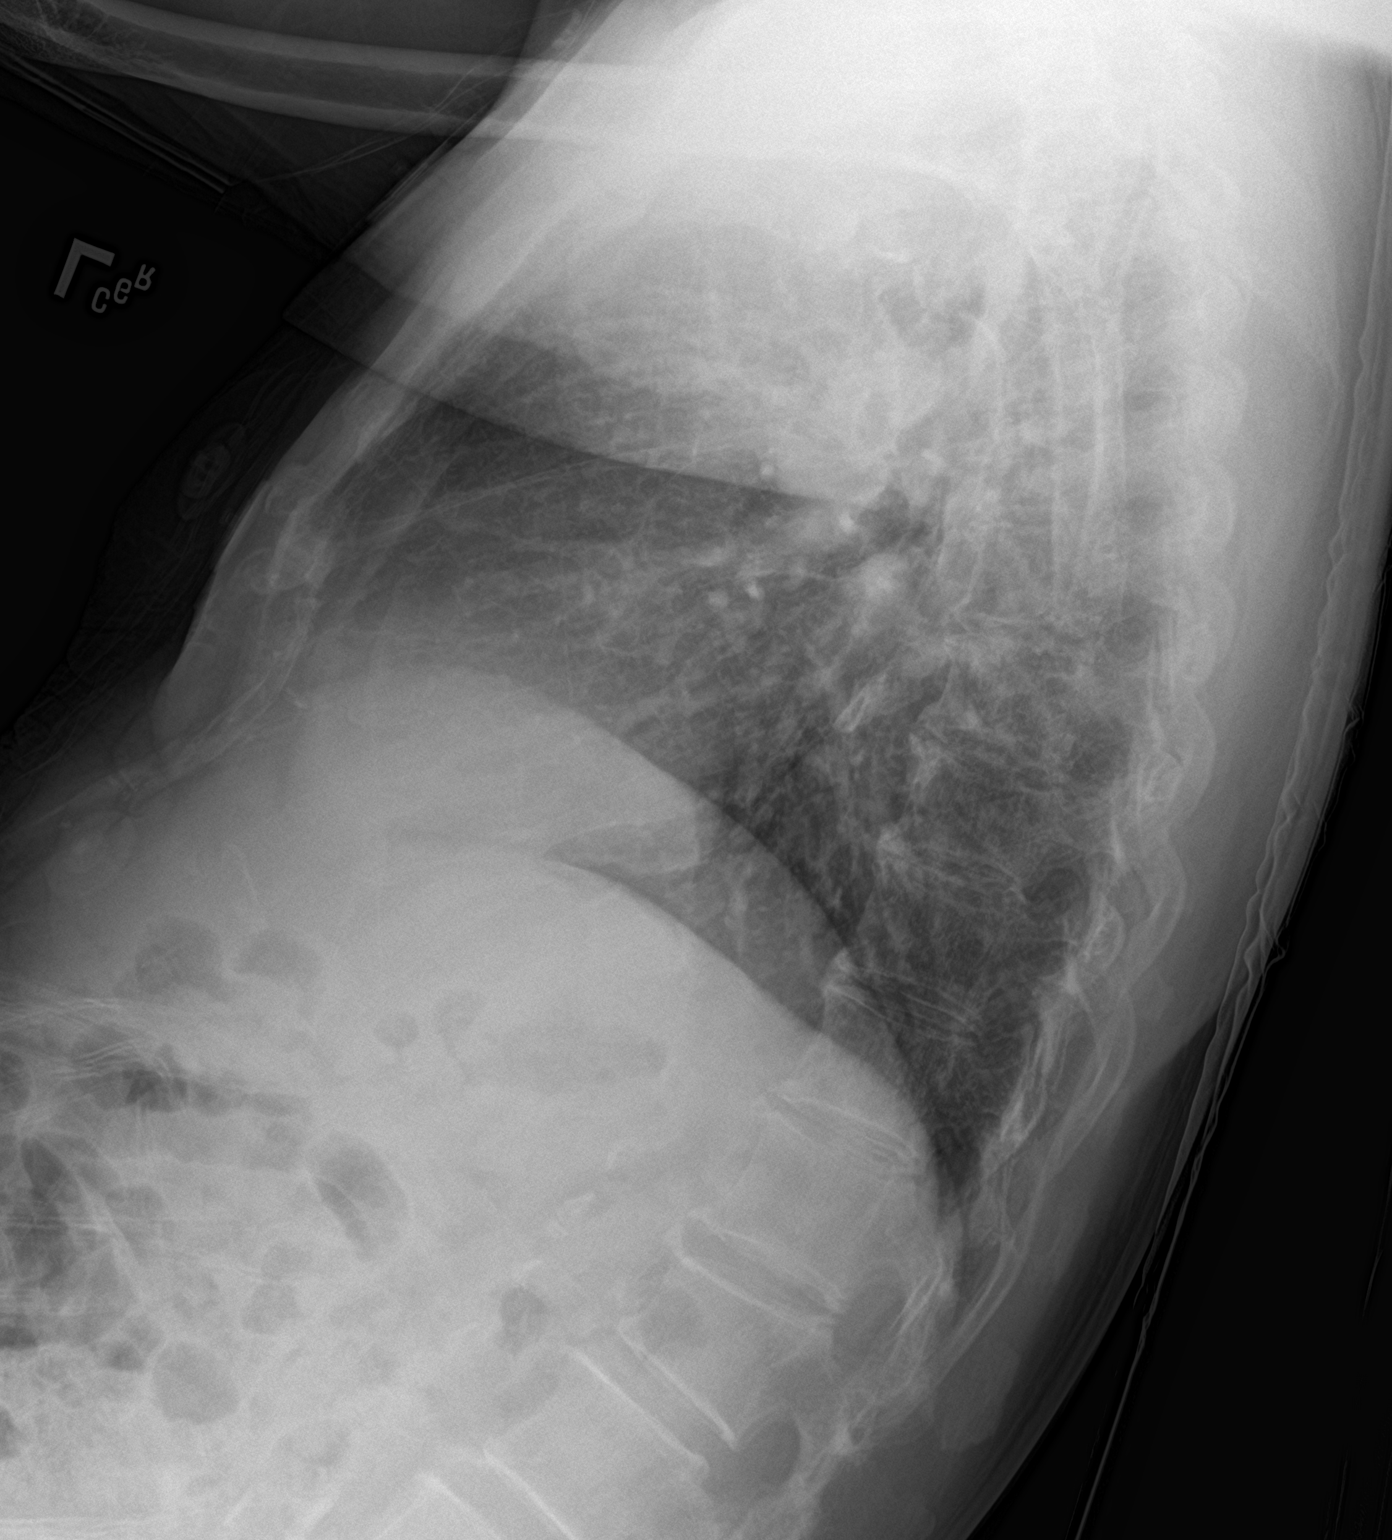

[chest ap]
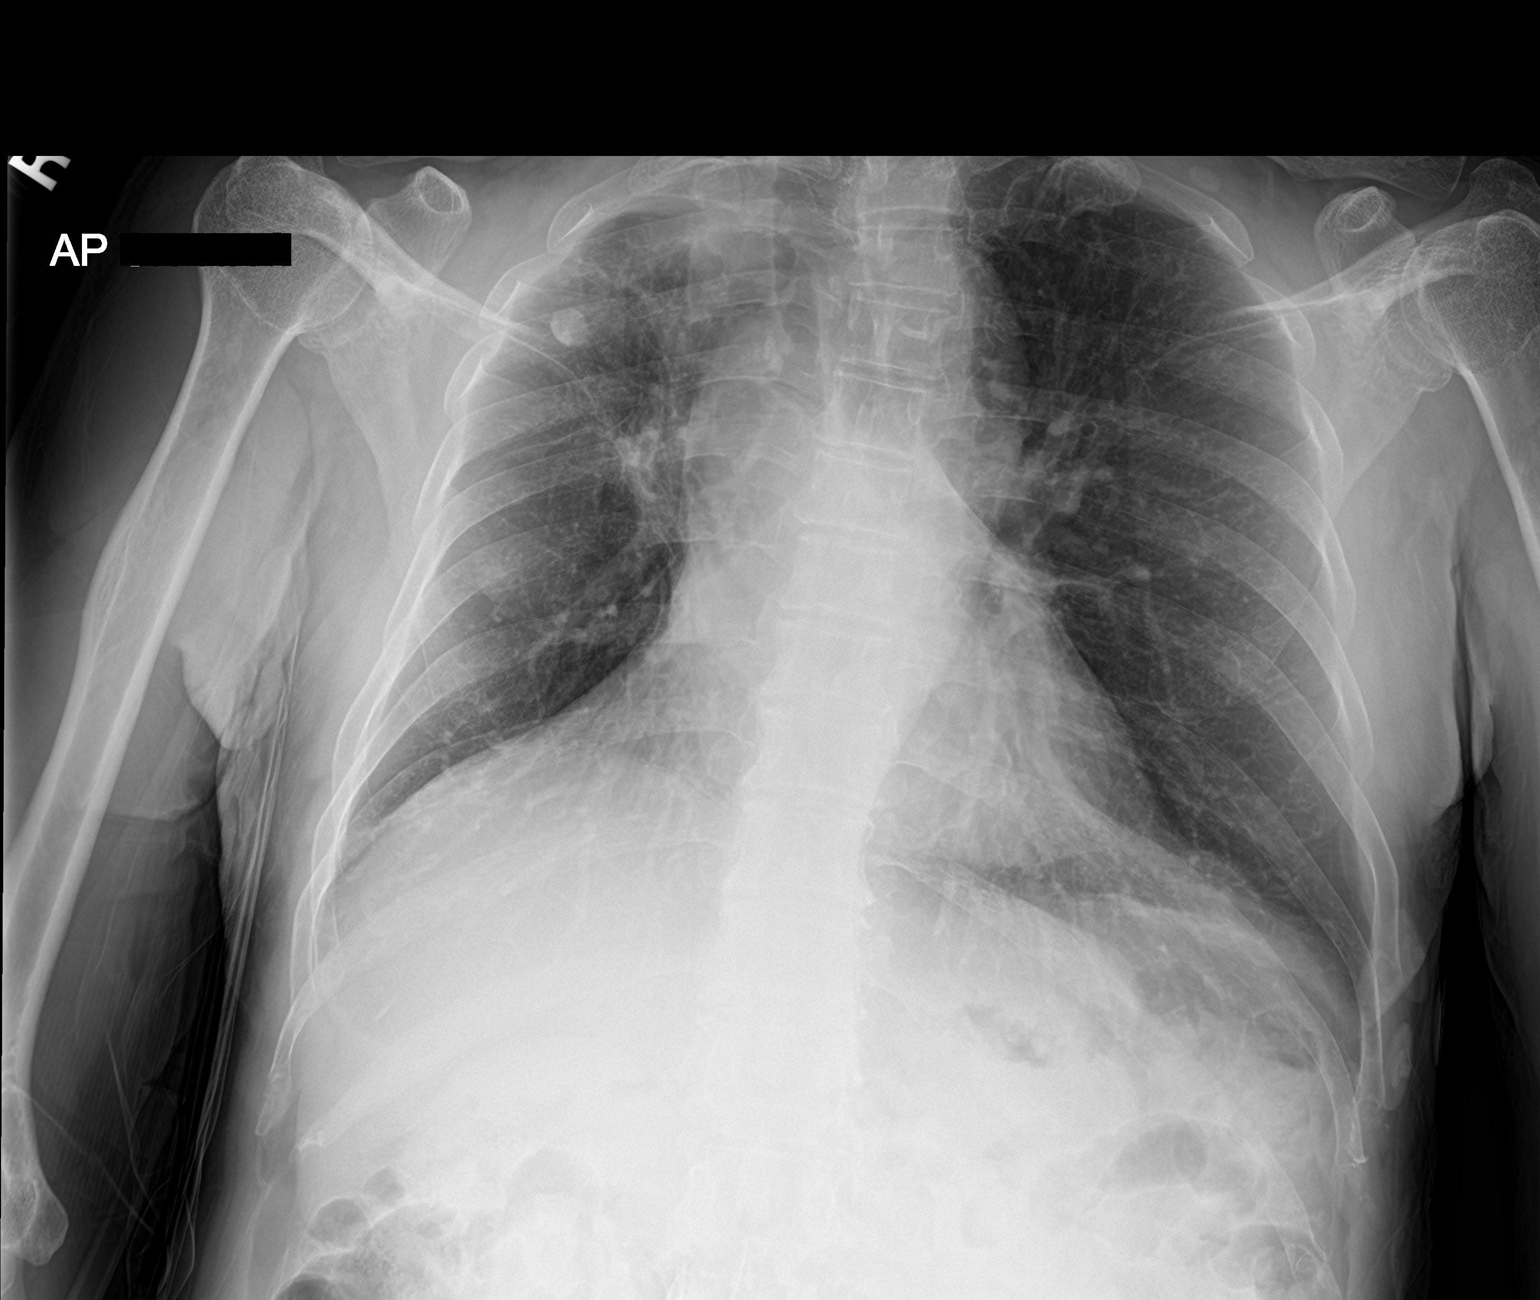

[2 of 2 positions shown; findings below may reference images not displayed]

FINDINGS: Calcified granuloma right upper lobe unchanged. Right upper lobe
scarring with retraction right hilum and pleural thickening in the
right apex unchanged.

Negative for acute infiltrate or effusion. Negative for heart
failure.

Chronic compression fracture L2 unchanged.
IMPRESSION: No acute cardiopulmonary or mallet. Chronic scarring right upper
lobe.

## 2020-07-20 IMAGING — CT CT ABD-PELV W/ CM
2 of 5 series · 15 of 46 positions shown, 17 images · IV contrast (APPLIED)
Comparison: [DATE].

CLINICAL DATA: Acute right upper quadrant abdominal pain.

EXAM:
CT ABDOMEN AND PELVIS WITH CONTRAST
TECHNIQUE: Multidetector CT imaging of the abdomen and pelvis was performed
using the standard protocol following bolus administration of
intravenous contrast.
CONTRAST:  100mL OMNIPAQUE IOHEXOL 300 MG/ML  SOLN

[Series 2: routine abd/pel with · axial · 0.67mm/px · z∈[-829,-439]mm · 12 of 88 slices shown, 14 images]
[im 5/88  soft-tissue]
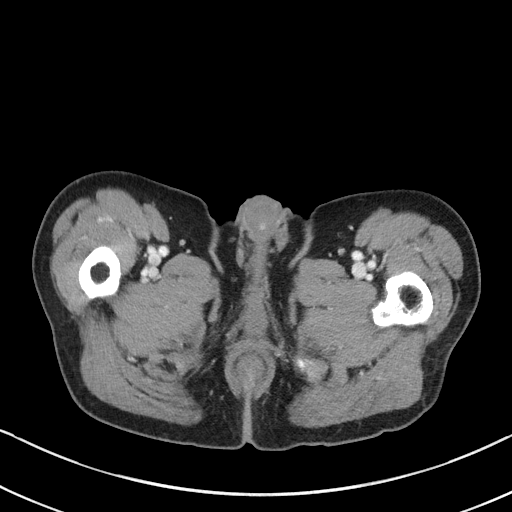
[im 5/88  bone]
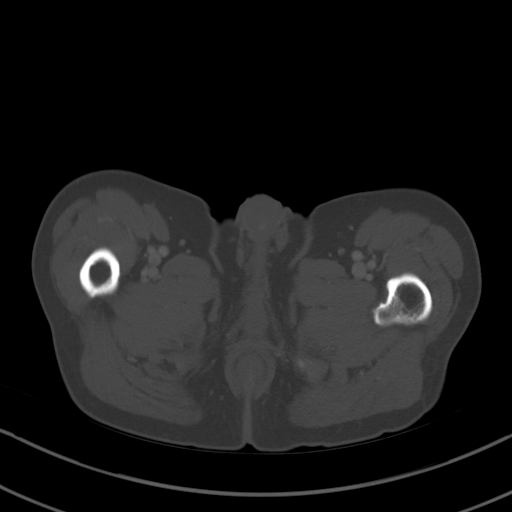
[im 14/88  soft-tissue]
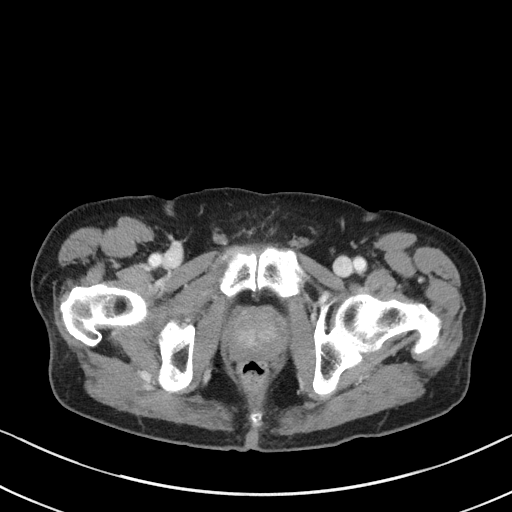
[im 19/88  soft-tissue]
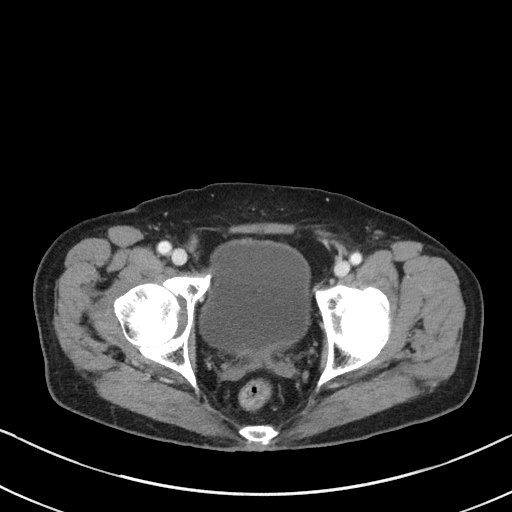
[im 28/88  soft-tissue]
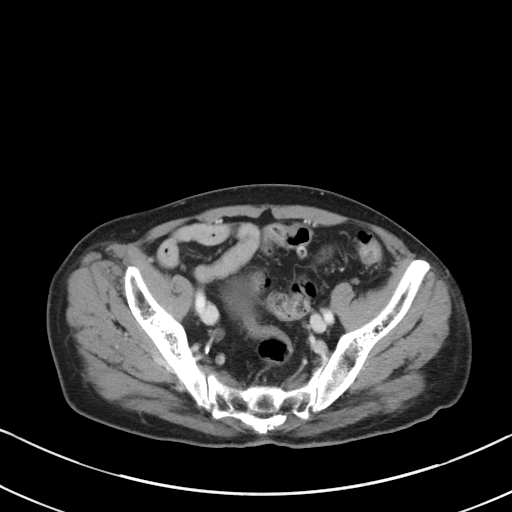
[im 33/88  soft-tissue]
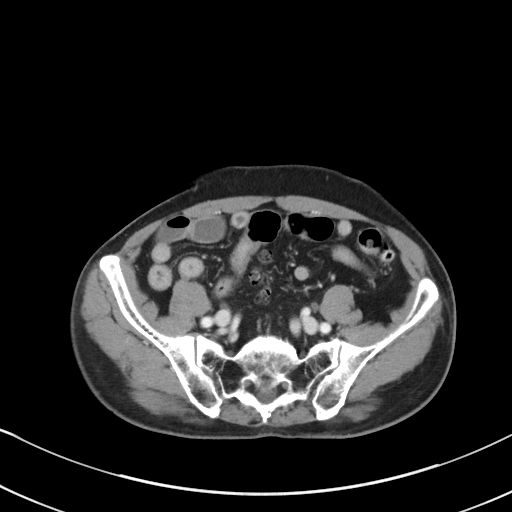
[im 42/88  soft-tissue]
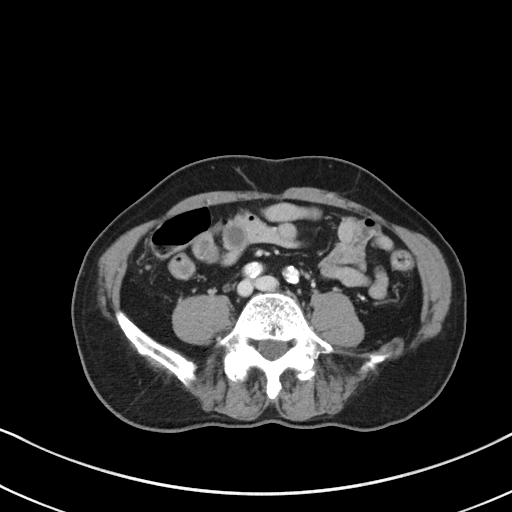
[im 46/88  soft-tissue]
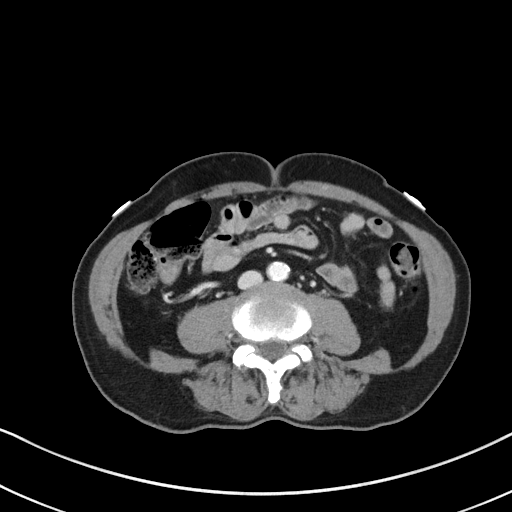
[im 55/88  soft-tissue]
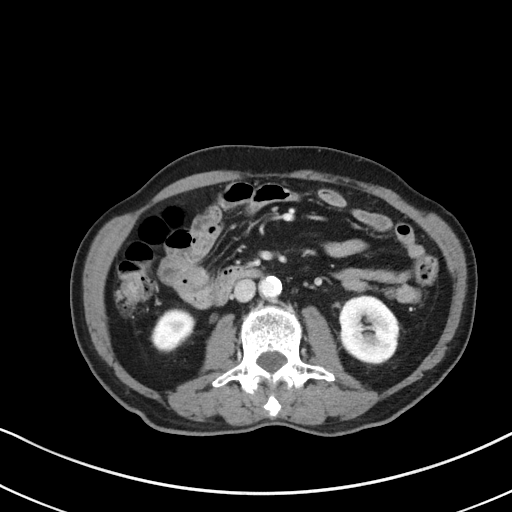
[im 60/88  soft-tissue]
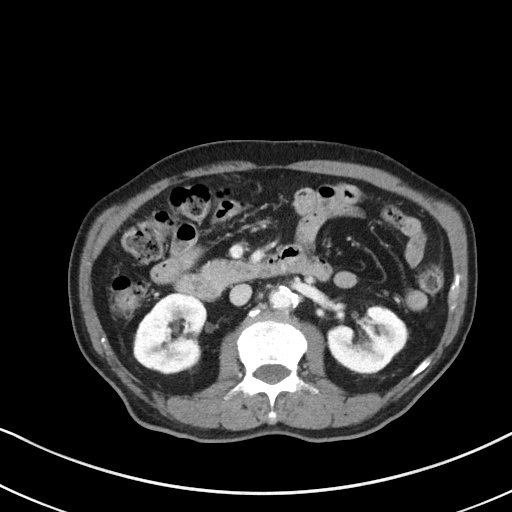
[im 60/88  bone]
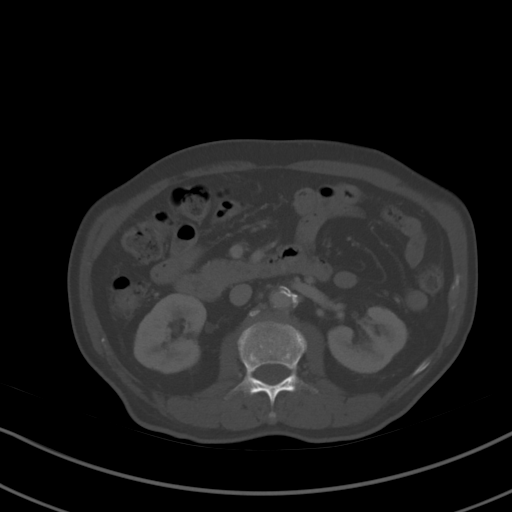
[im 69/88  soft-tissue]
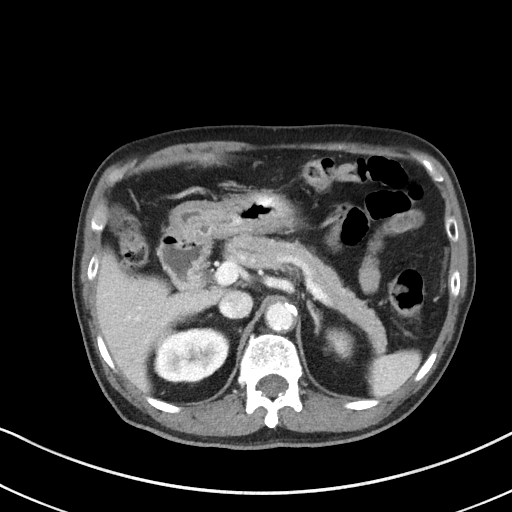
[im 74/88  soft-tissue]
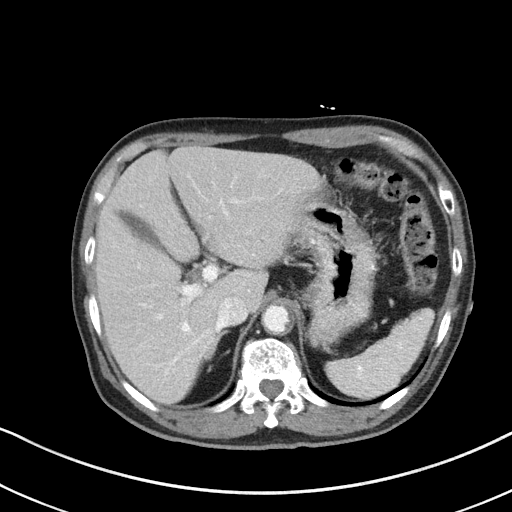
[im 83/88  soft-tissue]
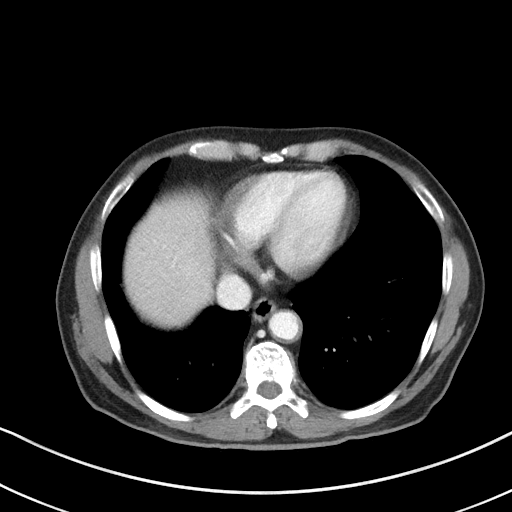

[Series 5: coronal st · coronal · 0.61mm/px · 3 of 77 slices shown]
[im 26/77  soft-tissue]
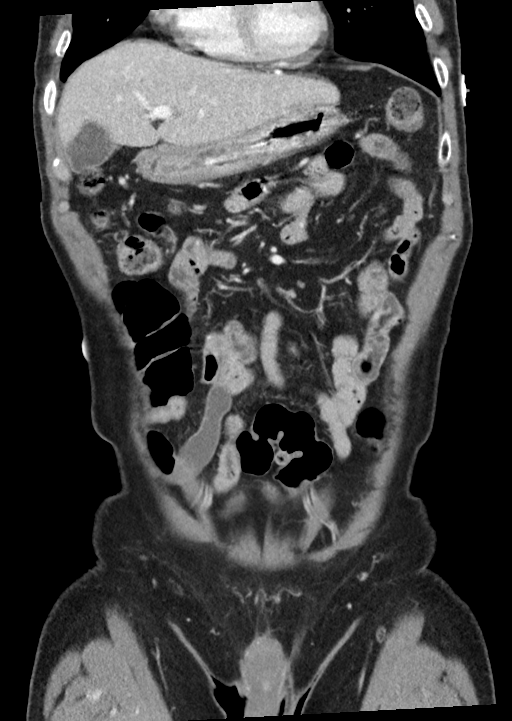
[im 34/77  soft-tissue]
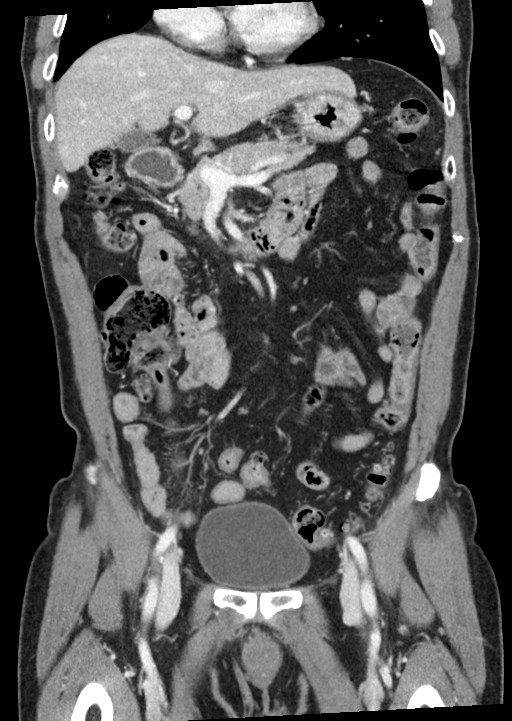
[im 43/77  soft-tissue]
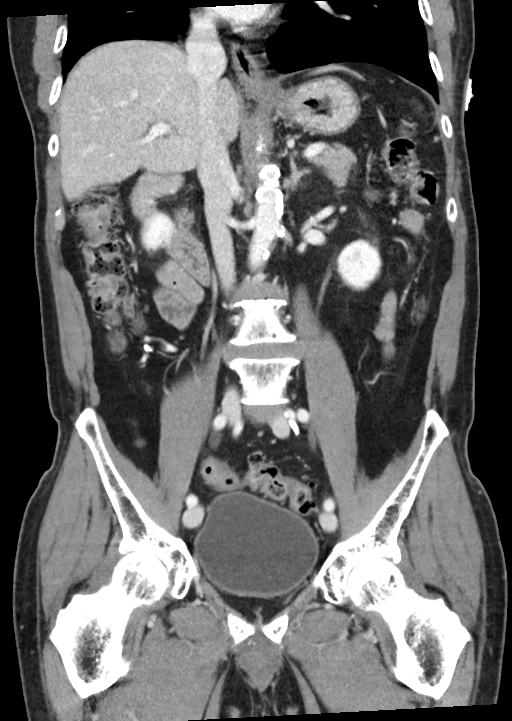

[15 of 46 positions shown; findings below may reference images not displayed]

FINDINGS: Lower chest: 7 mm nodular density is noted in the right lower lobe.

Hepatobiliary: No focal liver abnormality is seen. No gallstones,
gallbladder wall thickening, or biliary dilatation.

Pancreas: Unremarkable. No pancreatic ductal dilatation or
surrounding inflammatory changes.

Spleen: Normal in size without focal abnormality.

Adrenals/Urinary Tract: Adrenal glands appear normal. Multiple small
bilateral renal cysts are noted. No hydronephrosis or renal
obstruction is noted. Small nonobstructive right renal calculus is
noted. Urinary bladder is unremarkable.

Stomach/Bowel: Stomach is within normal limits. Appendix appears
normal. No evidence of bowel wall thickening, distention, or
inflammatory changes.

Vascular/Lymphatic: Aortic atherosclerosis. No enlarged abdominal or
pelvic lymph nodes.

Reproductive: Mild prostatic enlargement is noted.

Other: No abdominal wall hernia or abnormality. No abdominopelvic
ascites.

Musculoskeletal: No acute or significant osseous findings.
IMPRESSION: Small nonobstructive right renal calculus. No hydronephrosis or
renal obstruction is noted.

7 mm nodular density is noted in right lower lobe. Non-contrast
chest CT at 6-12 months is recommended. If the nodule is stable at
time of repeat CT, then future CT at 18-24 months (from today's
scan) is considered optional for low-risk patients, but is
recommended for high-risk patients. This recommendation follows the
consensus statement: Guidelines for Management of Incidental
Pulmonary Nodules Detected on CT Images: From the [HOSPITAL]

Mild prostatic enlargement.

Multiple small bilateral renal cysts are noted.

Aortic Atherosclerosis ([P8]-[P8]).

## 2020-07-20 MED ORDER — PREDNISONE 50 MG PO TABS: 50.0000 mg | ORAL_TABLET | Freq: Every day | ORAL | 0 refills | Status: DC

## 2020-07-20 MED ORDER — METHYLPREDNISOLONE SODIUM SUCC 125 MG IJ SOLR
125.0000 mg | Freq: Once | INTRAMUSCULAR | Status: AC
  Administered 2020-07-20: 125 mg via INTRAVENOUS
  Filled 2020-07-20: qty 2

## 2020-07-20 MED ORDER — IOHEXOL 300 MG/ML  SOLN
100.0000 mL | Freq: Once | INTRAMUSCULAR | Status: AC | PRN
  Administered 2020-07-20: 100 mL via INTRAVENOUS

## 2020-07-20 MED ORDER — DOXYCYCLINE HYCLATE 100 MG PO TABS
100.0000 mg | ORAL_TABLET | Freq: Once | ORAL | Status: AC
  Administered 2020-07-20: 100 mg via ORAL
  Filled 2020-07-20: qty 1

## 2020-07-20 MED ORDER — KETOROLAC TROMETHAMINE 30 MG/ML IJ SOLN
15.0000 mg | Freq: Once | INTRAMUSCULAR | Status: AC
  Administered 2020-07-20: 15 mg via INTRAVENOUS
  Filled 2020-07-20: qty 1

## 2020-07-20 MED ORDER — MORPHINE SULFATE (PF) 4 MG/ML IV SOLN
4.0000 mg | Freq: Once | INTRAVENOUS | Status: AC
  Administered 2020-07-20: 4 mg via INTRAVENOUS
  Filled 2020-07-20: qty 1

## 2020-07-20 MED ORDER — LACTATED RINGERS IV BOLUS
1000.0000 mL | Freq: Once | INTRAVENOUS | Status: AC
  Administered 2020-07-20: 1000 mL via INTRAVENOUS

## 2020-07-20 MED ORDER — DOXYCYCLINE HYCLATE 100 MG PO TABS: 100.0000 mg | ORAL_TABLET | Freq: Two times a day (BID) | ORAL | 0 refills | Status: AC

## 2020-07-20 NOTE — ED Notes (Signed)
E sig pad not working for e sig for discharge. Attempted to print out paper; it did not print. Pt and daughter gave verbal consent for discharge.

## 2020-07-20 NOTE — Discharge Instructions (Addendum)
You are being discharged with a prescription for antibiotics and steroids for the next 5 days to treat a mild COPD exacerbation, due to your increased sputum production.   Please take doxycycline antibiotics twice daily for the next 5 days. We already gave you a dose of steroids for today, so please pick up the steroids and start taking this prescription tomorrow.   Return to the ED with any further worsening symptoms despite these medications.

## 2020-07-20 NOTE — ED Provider Notes (Signed)
[facility] Emergency Department Provider Note ____________________________________________   Event Date/Time   First MD Initiated Contact with Patient 07/20/20 [phone]     (approximate)  I have reviewed the triage vital signs and the nursing notes.  HISTORY  Chief Complaint Abdominal Pain   HPI Jonathan Willis is a 75 y.o. malewho presents to the ED for evaluation of abd pain.   Chart review indicates Hx COPD on 3LNC chronically.  Diffuse colonic diverticulosis on 2019 colonoscopy.  Patient presents with his daughter for evaluation of RUQ/right basilar chest pain for the past 5 days.  Patient prefers to use his daughter as Optometrist.   Patient reports 5 days of RUQ abdominal pain with concurrent diarrhea.  Further reports increased sputum production from his baseline smoker's cough, but denies shortness of breath, wheezing, fever, substernal chest pain.  Denies any emesis or nausea, and again only reports watery diarrhea few times per day over the past 5 days with his pain.   Past Medical History:  Diagnosis Date  . Chronic cough 01/16/2017  . Diabetes mellitus without complication ([place])   . External hemorrhoid   . 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 History of TB (tuberculosis)   . Hypertension   . Lower GI bleed     Patient Active Problem List   Diagnosis Date Noted  . Sinus pressure 01/13/2020  . COPD with acute exacerbation ([place]) 07/10/2019  . Vertigo 05/15/2018  . Urinary frequency 02/26/2018  . Nocturia 02/26/2018  . Occipital neuralgia of right side 09/05/2017  . Headache 07/01/2017  . Carotid stenosis 07/01/2017  . Benign prostatic hyperplasia with incomplete bladder emptying 03/23/2017  . Chronic right shoulder pain 01/16/2017  . Essential hypertension 01/16/2017  . Type 2 diabetes mellitus with other specified complication ([place]) 0000000  . Dyslipidemia associated with type 2  diabetes mellitus ([place]) 01/16/2017  . Centrilobular emphysema ([place]) 01/16/2017    Past Surgical History:  Procedure Laterality Date  . COLONOSCOPY WITH PROPOFOL N/A 04/10/2017   Procedure: COLONOSCOPY WITH PROPOFOL;  Surgeon: [name], MD;  Location: [facility] ENDOSCOPY;  Service: Gastroenterology;  Laterality: N/A;  . NO PAST SURGERIES      Prior to Admission medications   Medication Sig Start Date End Date Taking? Authorizing Provider  doxycycline (VIBRA-TABS) 100 MG tablet Take 1 tablet (100 mg total) by mouth 2 (two) times daily for 5 days. 07/20/20 07/25/20 Yes Vladimir Crofts, MD  predniSONE (DELTASONE) 50 MG tablet Take 1 tablet (50 mg total) by mouth daily with breakfast. 07/20/20  Yes Vladimir Crofts, MD  ACCU-CHEK AVIVA PLUS test strip USE 1 TEST STRIP TO TEST BLOOD SUGAR DAILY 10/15/19   Malfi, Lupita Raider, FNP  Accu-Chek Softclix Lancets lancets USE TO TEST ONCE DAILY 10/16/18   Mikey College, NP  albuterol (VENTOLIN HFA) 108 (90 Base) MCG/ACT inhaler Inhale 2 puffs into the lungs every 6 (six) hours as needed for wheezing or shortness of breath. 07/10/19   Malfi, Lupita Raider, FNP  aspirin 81 MG chewable tablet Chew 81 mg by mouth daily.     [provider]  atorvastatin (LIPITOR) 20 MG tablet TAKE 1 TABLET BY MOUTH EVERY DAY 09/01/19   Malfi, Lupita Raider, FNP  baclofen (LIORESAL) 10 MG tablet TAKE 1 TABLET (10 MG TOTAL) BY MOUTH 3 (THREE) TIMES DAILY. AS NEEDED Patient not taking: No sig reported 04/09/20   Verl Bangs, FNP  fluconazole (DIFLUCAN) 100 MG tablet Take  100 mg by mouth daily. 06/02/20   [provider]  Fluticasone-Umeclidin-Vilant (TRELEGY ELLIPTA) 100-62.5-25 MCG/INH AEPB Inhale 1 puff into the lungs daily. 04/16/20   Malfi, Lupita Raider, FNP  hydrochlorothiazide (HYDRODIURIL) 12.5 MG tablet TAKE 1 TABLET BY MOUTH EVERY DAY 03/12/20   Malfi, Lupita Raider, FNP  ipratropium-albuterol (DUONEB) 0.5-2.5 (3) MG/3ML SOLN Inhale into the lungs. 01/31/20 01/25/21  [provider]  levofloxacin (LEVAQUIN) 500 MG tablet Take 1 tablet (500 mg total) by mouth daily. For 7 days 06/15/20   Olin Hauser, DO  meclizine (ANTIVERT) 12.5 MG tablet TAKE 1 TABLET (12.5 MG TOTAL) BY MOUTH 3 (THREE) TIMES DAILY AS NEEDED FOR DIZZINESS. Patient not taking: No sig reported 09/16/19   Malfi, Lupita Raider, FNP  metoprolol tartrate (LOPRESSOR) 50 MG tablet Take 1 tablet (50 mg total) by mouth 2 (two) times daily. 04/11/19   Karamalegos, Devonne Doughty, DO  oxybutynin (DITROPAN-XL) 5 MG 24 hr tablet TAKE 1 TABLET BY MOUTH EVERY DAY 04/06/20   Malfi, Lupita Raider, FNP  predniSONE (DELTASONE) 20 MG tablet Take daily with food. Start with 60mg  (3 pills) x 2 days, then reduce to 40mg  (2 pills) x 2 days, then 20mg  (1 pill) x 3 days 06/15/20   Olin Hauser, DO  promethazine-dextromethorphan (PROMETHAZINE-DM) 6.25-15 MG/5ML syrup Take 5 mLs by mouth 4 (four) times daily as needed for cough. Patient not taking: No sig reported 01/29/20   Malfi, Lupita Raider, FNP  Spacer/Aero-Hold Chamber Bags MISC 1 Device by Does not apply route every 6 (six) hours as needed (with inhaler). 01/10/17   Mikey College, NP  tamsulosin (FLOMAX) 0.4 MG CAPS capsule TAKE 2 CAPSULES BY MOUTH EVERY DAY 04/06/20   Malfi, Lupita Raider, FNP  telmisartan (MICARDIS) 40 MG tablet TAKE 1 TABLET BY MOUTH EVERY DAY 02/12/20   Malfi, Lupita Raider, FNP    Allergies Patient has no known allergies.  Family History  Problem Relation Age of Onset  . COPD Neg Hx   . Diabetes Mellitus II Neg Hx   . Hypertension Neg Hx     Social History Social History   Tobacco Use  . Smoking status: Former Smoker    Packs/day: 1.50    Years: 44.00    Pack years: 66.00    Types: Cigarettes    Quit date: 01/11/2007    Years since quitting: 13.5  . Smokeless tobacco: Former Network engineer  . Vaping Use: Never used  Substance Use Topics  . Alcohol use: No  . Drug use: No    Review of Systems  Constitutional: No  fever/chills Eyes: No visual changes. ENT: No sore throat. Cardiovascular: Right basilar chest pain Respiratory: Denies shortness of breath.  Positive for increased sputum production Gastrointestinal:   No nausea, no vomiting. No constipation. Positive for abdominal pain and diarrhea Genitourinary: Negative for dysuria. Musculoskeletal: Negative for back pain. Skin: Negative for rash. Neurological: Negative for headaches, focal weakness or numbness.  ____________________________________________   PHYSICAL EXAM:  VITAL SIGNS: Vitals:   07/20/20 1115 07/20/20 1253  BP: 130/70 131/68  Pulse: 78 75  Resp: 16 15  Temp:    SpO2: 100% 100%     Constitutional: Alert and oriented.  Appears uncomfortable, splinting/holding his right basilar chest.  No apparent dyspnea.  Conversational in [place] through his daughter. Eyes: Conjunctivae are normal. PERRL. EOMI. Head: Atraumatic. Nose: No congestion/rhinnorhea. Mouth/Throat: Mucous membranes are moist.  Oropharynx non-erythematous. Neck: No stridor. No cervical spine tenderness to palpation. Cardiovascular:  Normal rate, regular rhythm. Grossly normal heart sounds.  Good peripheral circulation. Respiratory: Normal respiratory effort.  No retractions.  Scattered and intermittent expiratory wheezes but good air movement throughout. Gastrointestinal: Soft , nondistended. No CVA tenderness bilaterally. RUQ, epigastric and suprapubic tenderness with some voluntary guarding.  No further peritoneal features.  Abdomen is otherwise benign on the left side and epigastrium. Musculoskeletal: No lower extremity tenderness nor edema.  No joint effusions. No signs of acute trauma. Neurologic:  Normal speech and language. No gross focal neurologic deficits are appreciated. No gait instability noted. Skin:  Skin is warm, dry and intact. No rash noted. Psychiatric: Mood and affect are normal. Speech and behavior are  normal.  ____________________________________________   LABS (all labs ordered are listed, but only abnormal results are displayed)  Labs Reviewed  COMPREHENSIVE METABOLIC PANEL - Abnormal; Notable for the following components:      Result Value   Glucose, Bld 145 (*)    Calcium 8.8 (*)    All other components within normal limits  URINALYSIS, COMPLETE (UACMP) WITH MICROSCOPIC - Abnormal; Notable for the following components:   Color, Urine STRAW (*)    APPearance CLEAR (*)    Glucose, UA 150 (*)    All other components within normal limits  LIPASE, BLOOD  CBC  TROPONIN I (HIGH SENSITIVITY)  TROPONIN I (HIGH SENSITIVITY)   ____________________________________________  12 Lead EKG  Sinus rhythm, rate of 83 bpm.  Normal axis and intervals.  Nonspecific ST changes laterally without STEMI criteria. ____________________________________________  RADIOLOGY  ED MD interpretation: 2 view CXR reviewed by me without evidence of acute cardiopulmonary pathology. CT abdomen/pelvis reviewed by me without evidence of acute intra-abdominal pathology.  Official radiology report(s): DG Chest 2 View  Result Date: 07/20/2020 CLINICAL DATA:  COPD with productive cough EXAM: CHEST - 2 VIEW COMPARISON:  01/29/2020 FINDINGS: Calcified granuloma right upper lobe unchanged. Right upper lobe scarring with retraction right hilum and pleural thickening in the right apex unchanged. Negative for acute infiltrate or effusion. Negative for heart failure. Chronic compression fracture L2 unchanged. IMPRESSION: No acute cardiopulmonary or mallet. Chronic scarring right upper lobe. Electronically Signed   By: Franchot Gallo M.D.   On: 07/20/2020 10:58   CT ABDOMEN PELVIS W CONTRAST  Result Date: 07/20/2020 CLINICAL DATA:  Acute right upper quadrant abdominal pain. EXAM: CT ABDOMEN AND PELVIS WITH CONTRAST TECHNIQUE: Multidetector CT imaging of the abdomen and pelvis was performed using the standard protocol  following bolus administration of intravenous contrast. CONTRAST:  151mL OMNIPAQUE IOHEXOL 300 MG/ML  SOLN COMPARISON:  February 02, 2014. FINDINGS: Lower chest: 7 mm nodular density is noted in the right lower lobe. Hepatobiliary: No focal liver abnormality is seen. No gallstones, gallbladder wall thickening, or biliary dilatation. Pancreas: Unremarkable. No pancreatic ductal dilatation or surrounding inflammatory changes. Spleen: Normal in size without focal abnormality. Adrenals/Urinary Tract: Adrenal glands appear normal. Multiple small bilateral renal cysts are noted. No hydronephrosis or renal obstruction is noted. Small nonobstructive right renal calculus is noted. Urinary bladder is unremarkable. Stomach/Bowel: Stomach is within normal limits. Appendix appears normal. No evidence of bowel wall thickening, distention, or inflammatory changes. Vascular/Lymphatic: Aortic atherosclerosis. No enlarged abdominal or pelvic lymph nodes. Reproductive: Mild prostatic enlargement is noted. Other: No abdominal wall hernia or abnormality. No abdominopelvic ascites. Musculoskeletal: No acute or significant osseous findings. IMPRESSION: Small nonobstructive right renal calculus. No hydronephrosis or renal obstruction is noted. 7 mm nodular density is noted in right lower lobe. Non-contrast chest CT at 6-12  months is recommended. If the nodule is stable at time of repeat CT, then future CT at 18-24 months (from today's scan) is considered optional for low-risk patients, but is recommended for high-risk patients. This recommendation follows the consensus statement: Guidelines for Management of Incidental Pulmonary Nodules Detected on CT Images: From the Fleischner Society 2017; Radiology 2017; 284:228-243. Mild prostatic enlargement. Multiple small bilateral renal cysts are noted. Aortic Atherosclerosis (ICD10-I70.0). Electronically Signed   By: Marijo Conception M.D.   On: 07/20/2020 10:56     ____________________________________________   PROCEDURES and INTERVENTIONS  Procedure(s) performed (including Critical Care):  .1-3 Lead EKG Interpretation Performed by: Vladimir Crofts, MD Authorized by: Vladimir Crofts, MD     Interpretation: normal     ECG rate:  74   ECG rate assessment: normal     Rhythm: sinus rhythm     Ectopy: none     Conduction: normal      Medications  lactated ringers bolus 1,000 mL (0 mLs Intravenous Stopped 07/20/20 1253)  morphine 4 MG/ML injection 4 mg (4 mg Intravenous Given 07/20/20 0947)  iohexol (OMNIPAQUE) 300 MG/ML solution 100 mL (100 mLs Intravenous Contrast Given 07/20/20 1025)  ketorolac (TORADOL) 30 MG/ML injection 15 mg (15 mg Intravenous Given 07/20/20 1127)  doxycycline (VIBRA-TABS) tablet 100 mg (100 mg Oral Given 07/20/20 1128)  methylPREDNISolone sodium succinate (SOLU-MEDROL) 125 mg/2 mL injection 125 mg (125 mg Intravenous Given 07/20/20 1127)    ____________________________________________   MDM / ED COURSE   75 year old male with history of COPD presents to the ED with increased sputum production, RUQ abdominal pain and diarrhea, with some evidence of COPD exacerbation, but otherwise benign work-up and amenable to outpatient management.  Normal vitals on room air.  Exam with some mild and intermittent expiratory wheezes on auscultation.  Was noted to have RUQ tenderness with voluntary guarding that resolves with single dose of morphine and does not recur.  Clinically, overall, he looks well.  Blood work is quite benign without evidence of acute derangements.  CXR demonstrates no infiltrates or PTX.  CT emesis pelvis demonstrates no biliary pathology or other intra-abdominal pathology.  Urine without infectious features.  His symptoms resolved and he is tolerating p.o. intake.  I see no barriers to outpatient management.  Considering his increased sputum production and wheezing on auscultation, I think would be prudent to treat for  COPD exacerbation with a short course of doxycycline and steroids.  We discussed return precautions for the ED prior to discharge.  Clinical Course as of 07/20/20 1257  Mon Jul 20, 2020  1114 Reassessed.  Patient reports feeling well and has resolution of pain.  Reexamination reveals benign exam of his abdomen.  No pain with deep palpation to the RUQ.  We discussed treating for COPD exacerbation due to his increased pedal production and increased cough.  We discussed a short course of antibiotics and steroids.  They are agreeable.  We discussed second troponin and likely outpatient management thereafter. [DS]    Clinical Course User Index [DS] Vladimir Crofts, MD    ____________________________________________   FINAL CLINICAL IMPRESSION(S) / ED DIAGNOSES  Final diagnoses:  COPD exacerbation ([place])  RUQ abdominal pain     ED Discharge Orders         Ordered    doxycycline (VIBRA-TABS) 100 MG tablet  2 times daily        07/20/20 1215    predniSONE (DELTASONE) 50 MG tablet  Daily with breakfast  07/20/20 [place]   Note:  This document was prepared using Dragon voice recognition software and may include unintentional dictation errors.   Vladimir Crofts, MD 07/20/20 1300

## 2020-07-20 NOTE — ED Notes (Signed)
EDP at bedside examining pt.  Daughter at bedside. 

## 2020-07-20 NOTE — ED Notes (Signed)
EDP at bedside examining pt again and talking with pt about plan of care.

## 2020-07-20 NOTE — ED Notes (Signed)
Pt in CT.

## 2020-07-20 NOTE — ED Triage Notes (Addendum)
Patient in for abdominal pain x 5 days. Patient denies any N/V/D. Pt wears 3lpm via [place] at home.

## 2020-08-21 ENCOUNTER — Other Ambulatory Visit: Payer: Self-pay

## 2020-08-21 DIAGNOSIS — R42 Dizziness and giddiness: Secondary | ICD-10-CM

## 2020-08-21 MED ORDER — MECLIZINE HCL 12.5 MG PO TABS: 12.5000 mg | ORAL_TABLET | Freq: Three times a day (TID) | ORAL | 1 refills | Status: DC | PRN

## 2020-08-31 ENCOUNTER — Other Ambulatory Visit
Admission: RE | Admit: 2020-08-31 | Discharge: 2020-08-31 | Disposition: A | Discharge status: Home / Self Care | Payer: Medicare Other | Source: Ambulatory Visit | Attending: Pulmonary Disease | Admitting: Pulmonary Disease

## 2020-08-31 DIAGNOSIS — Z7982 Long term (current) use of aspirin: Secondary | ICD-10-CM | POA: Diagnosis not present

## 2020-08-31 DIAGNOSIS — Z9981 Dependence on supplemental oxygen: Secondary | ICD-10-CM | POA: Diagnosis not present

## 2020-08-31 DIAGNOSIS — Z8611 Personal history of tuberculosis: Secondary | ICD-10-CM | POA: Diagnosis not present

## 2020-08-31 DIAGNOSIS — Z79899 Other long term (current) drug therapy: Secondary | ICD-10-CM | POA: Diagnosis not present

## 2020-08-31 DIAGNOSIS — Z87891 Personal history of nicotine dependence: Secondary | ICD-10-CM | POA: Diagnosis not present

## 2020-08-31 DIAGNOSIS — J432 Centrilobular emphysema: Secondary | ICD-10-CM | POA: Diagnosis not present

## 2020-08-31 DIAGNOSIS — I10 Essential (primary) hypertension: Secondary | ICD-10-CM | POA: Diagnosis not present

## 2020-08-31 DIAGNOSIS — R0602 Shortness of breath: Secondary | ICD-10-CM | POA: Diagnosis not present

## 2020-08-31 DIAGNOSIS — E119 Type 2 diabetes mellitus without complications: Secondary | ICD-10-CM | POA: Diagnosis present

## 2020-08-31 DIAGNOSIS — Z20822 Contact with and (suspected) exposure to covid-19: Secondary | ICD-10-CM | POA: Diagnosis present

## 2020-08-31 DIAGNOSIS — J441 Chronic obstructive pulmonary disease with (acute) exacerbation: Secondary | ICD-10-CM | POA: Diagnosis not present

## 2020-08-31 DIAGNOSIS — J9611 Chronic respiratory failure with hypoxia: Secondary | ICD-10-CM | POA: Diagnosis not present

## 2020-08-31 DIAGNOSIS — N400 Enlarged prostate without lower urinary tract symptoms: Secondary | ICD-10-CM | POA: Diagnosis present

## 2020-08-31 DIAGNOSIS — R079 Chest pain, unspecified: Secondary | ICD-10-CM | POA: Diagnosis not present

## 2020-08-31 DIAGNOSIS — Z7951 Long term (current) use of inhaled steroids: Secondary | ICD-10-CM | POA: Diagnosis not present

## 2020-08-31 LAB — D-DIMER, QUANTITATIVE: D-Dimer, Quant: 0.28 ug/mL-FEU (ref 0.00–0.50)

## 2020-09-01 ENCOUNTER — Other Ambulatory Visit: Payer: Self-pay

## 2020-09-01 DIAGNOSIS — I10 Essential (primary) hypertension: Secondary | ICD-10-CM

## 2020-09-01 MED ORDER — TELMISARTAN 40 MG PO TABS: 40.0000 mg | ORAL_TABLET | Freq: Every day | ORAL | 1 refills | Status: DC

## 2020-09-02 ENCOUNTER — Encounter: Payer: Self-pay | Admitting: Emergency Medicine

## 2020-09-02 ENCOUNTER — Inpatient Hospital Stay
Admission: EM | Admit: 2020-09-02 | Discharge: 2020-09-04 | DRG: 191 | Disposition: A | Discharge status: Home / Self Care | Payer: Medicare Other | Attending: Internal Medicine | Admitting: Internal Medicine

## 2020-09-02 ENCOUNTER — Other Ambulatory Visit: Payer: Self-pay

## 2020-09-02 ENCOUNTER — Emergency Department: Payer: Medicare Other

## 2020-09-02 DIAGNOSIS — R079 Chest pain, unspecified: Secondary | ICD-10-CM | POA: Diagnosis not present

## 2020-09-02 DIAGNOSIS — E119 Type 2 diabetes mellitus without complications: Secondary | ICD-10-CM | POA: Diagnosis present

## 2020-09-02 DIAGNOSIS — Z20822 Contact with and (suspected) exposure to covid-19: Secondary | ICD-10-CM | POA: Diagnosis present

## 2020-09-02 DIAGNOSIS — I10 Essential (primary) hypertension: Secondary | ICD-10-CM | POA: Diagnosis present

## 2020-09-02 DIAGNOSIS — J432 Centrilobular emphysema: Secondary | ICD-10-CM | POA: Diagnosis not present

## 2020-09-02 DIAGNOSIS — E1169 Type 2 diabetes mellitus with other specified complication: Secondary | ICD-10-CM

## 2020-09-02 DIAGNOSIS — Z9981 Dependence on supplemental oxygen: Secondary | ICD-10-CM

## 2020-09-02 DIAGNOSIS — Z8611 Personal history of tuberculosis: Secondary | ICD-10-CM

## 2020-09-02 DIAGNOSIS — J9611 Chronic respiratory failure with hypoxia: Secondary | ICD-10-CM | POA: Diagnosis present

## 2020-09-02 DIAGNOSIS — Z79899 Other long term (current) drug therapy: Secondary | ICD-10-CM

## 2020-09-02 DIAGNOSIS — R0602 Shortness of breath: Secondary | ICD-10-CM | POA: Diagnosis not present

## 2020-09-02 DIAGNOSIS — Z87891 Personal history of nicotine dependence: Secondary | ICD-10-CM

## 2020-09-02 DIAGNOSIS — J9610 Chronic respiratory failure, unspecified whether with hypoxia or hypercapnia: Secondary | ICD-10-CM | POA: Diagnosis present

## 2020-09-02 DIAGNOSIS — J441 Chronic obstructive pulmonary disease with (acute) exacerbation: Secondary | ICD-10-CM | POA: Diagnosis not present

## 2020-09-02 DIAGNOSIS — N400 Enlarged prostate without lower urinary tract symptoms: Secondary | ICD-10-CM | POA: Diagnosis present

## 2020-09-02 DIAGNOSIS — Z7982 Long term (current) use of aspirin: Secondary | ICD-10-CM

## 2020-09-02 DIAGNOSIS — Z7951 Long term (current) use of inhaled steroids: Secondary | ICD-10-CM

## 2020-09-02 LAB — CBC
HCT: 46.3 % (ref 39.0–52.0)
Hemoglobin: 15.7 g/dL (ref 13.0–17.0)
MCH: 31.7 pg (ref 26.0–34.0)
MCHC: 33.9 g/dL (ref 30.0–36.0)
MCV: 93.3 fL (ref 80.0–100.0)
Platelets: 229 10*3/uL (ref 150–400)
RBC: 4.96 MIL/uL (ref 4.22–5.81)
RDW: 12.7 % (ref 11.5–15.5)
WBC: 6.7 10*3/uL (ref 4.0–10.5)
nRBC: 0 % (ref 0.0–0.2)

## 2020-09-02 LAB — BASIC METABOLIC PANEL
Anion gap: 12 (ref 5–15)
BUN: 22 mg/dL (ref 8–23)
CO2: 27 mmol/L (ref 22–32)
Calcium: 9.4 mg/dL (ref 8.9–10.3)
Chloride: 100 mmol/L (ref 98–111)
Creatinine, Ser: 0.82 mg/dL (ref 0.61–1.24)
GFR, Estimated: >60 mL/min (ref >60)
Glucose, Bld: 84 mg/dL (ref 70–99)
Potassium: 4 mmol/L (ref 3.5–5.1)
Sodium: 139 mmol/L (ref 135–145)

## 2020-09-02 LAB — SARS CORONAVIRUS 2 (TAT 6-24 HRS): SARS Coronavirus 2: NEGATIVE

## 2020-09-02 LAB — TROPONIN I (HIGH SENSITIVITY)
Troponin I (High Sensitivity): 5 ng/L (ref <18)
Troponin I (High Sensitivity): 4 ng/L (ref <18)

## 2020-09-02 LAB — HIV ANTIBODY (ROUTINE TESTING W REFLEX): HIV Screen 4th Generation wRfx: NONREACTIVE

## 2020-09-02 IMAGING — CR DG CHEST 2V
1 series · 2 of 2 positions shown · non-contrast
Comparison: Chest x-ray [DATE], [DATE], [DATE],
[DATE].

CLINICAL DATA: Chest pain.  Shortness of breath.

EXAM:
CHEST - 2 VIEW

[Series 1: dg chest 2 view · 0.14mm/px · 2 of 2 slices shown]
[im 1/2]
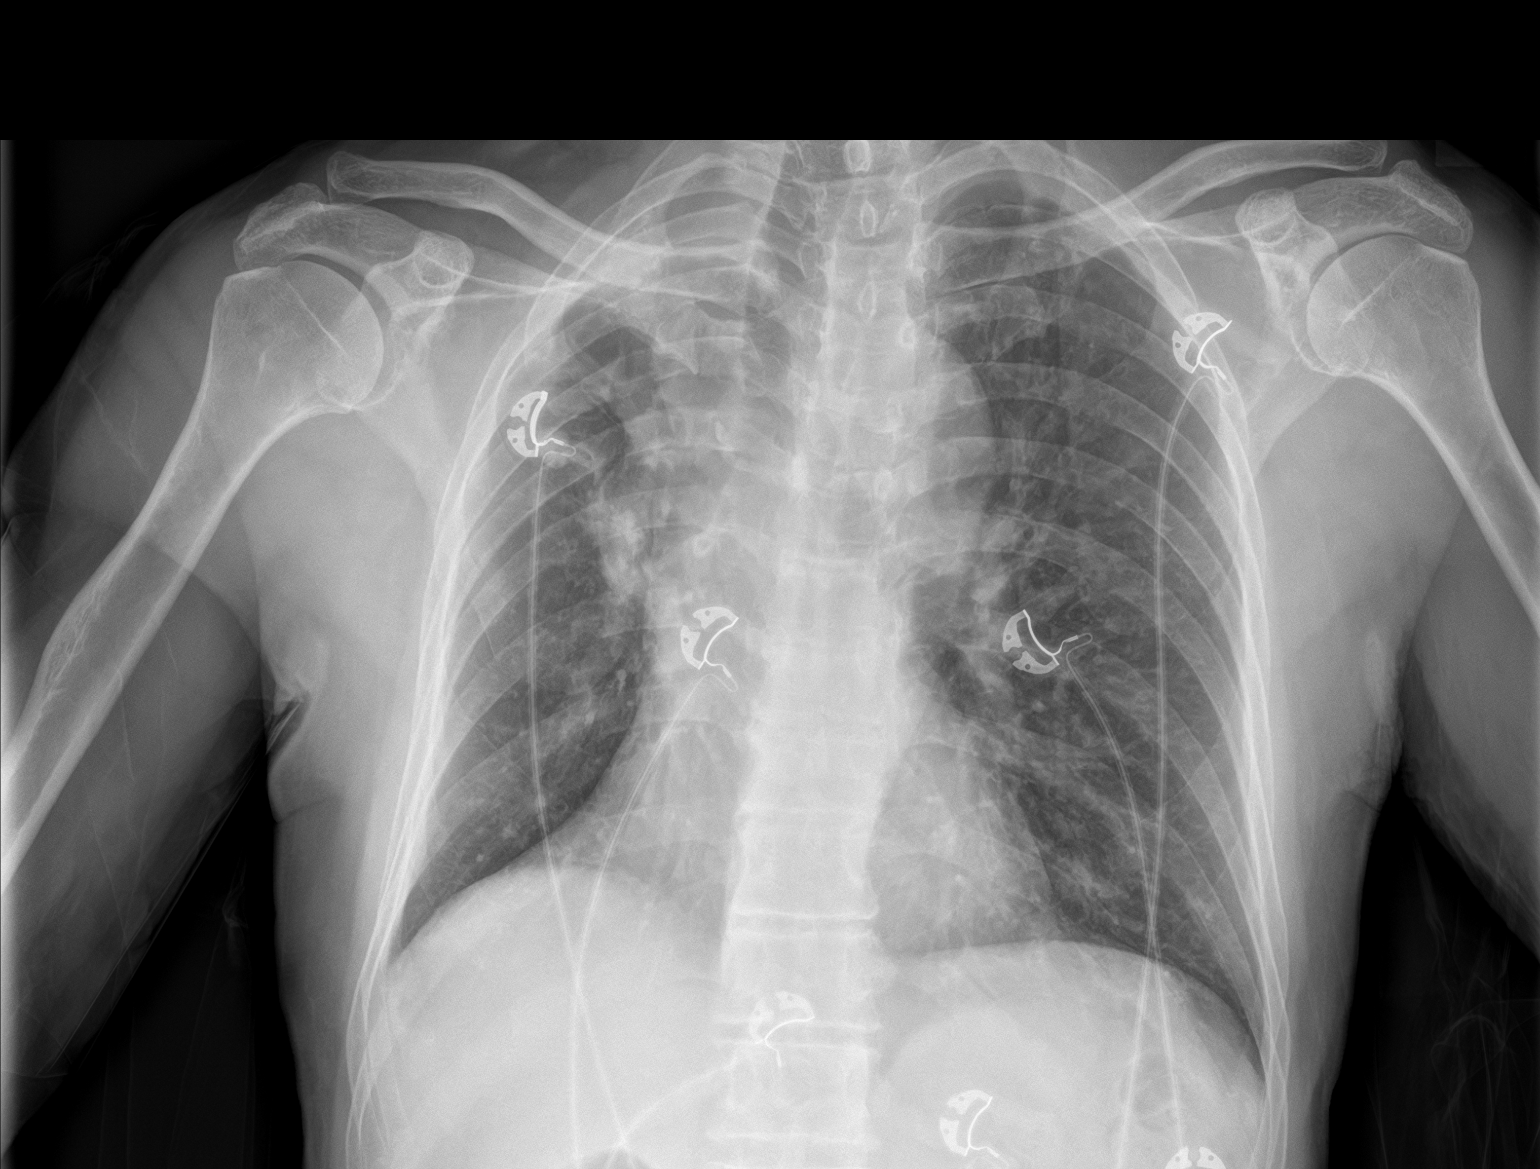
[im 2/2]
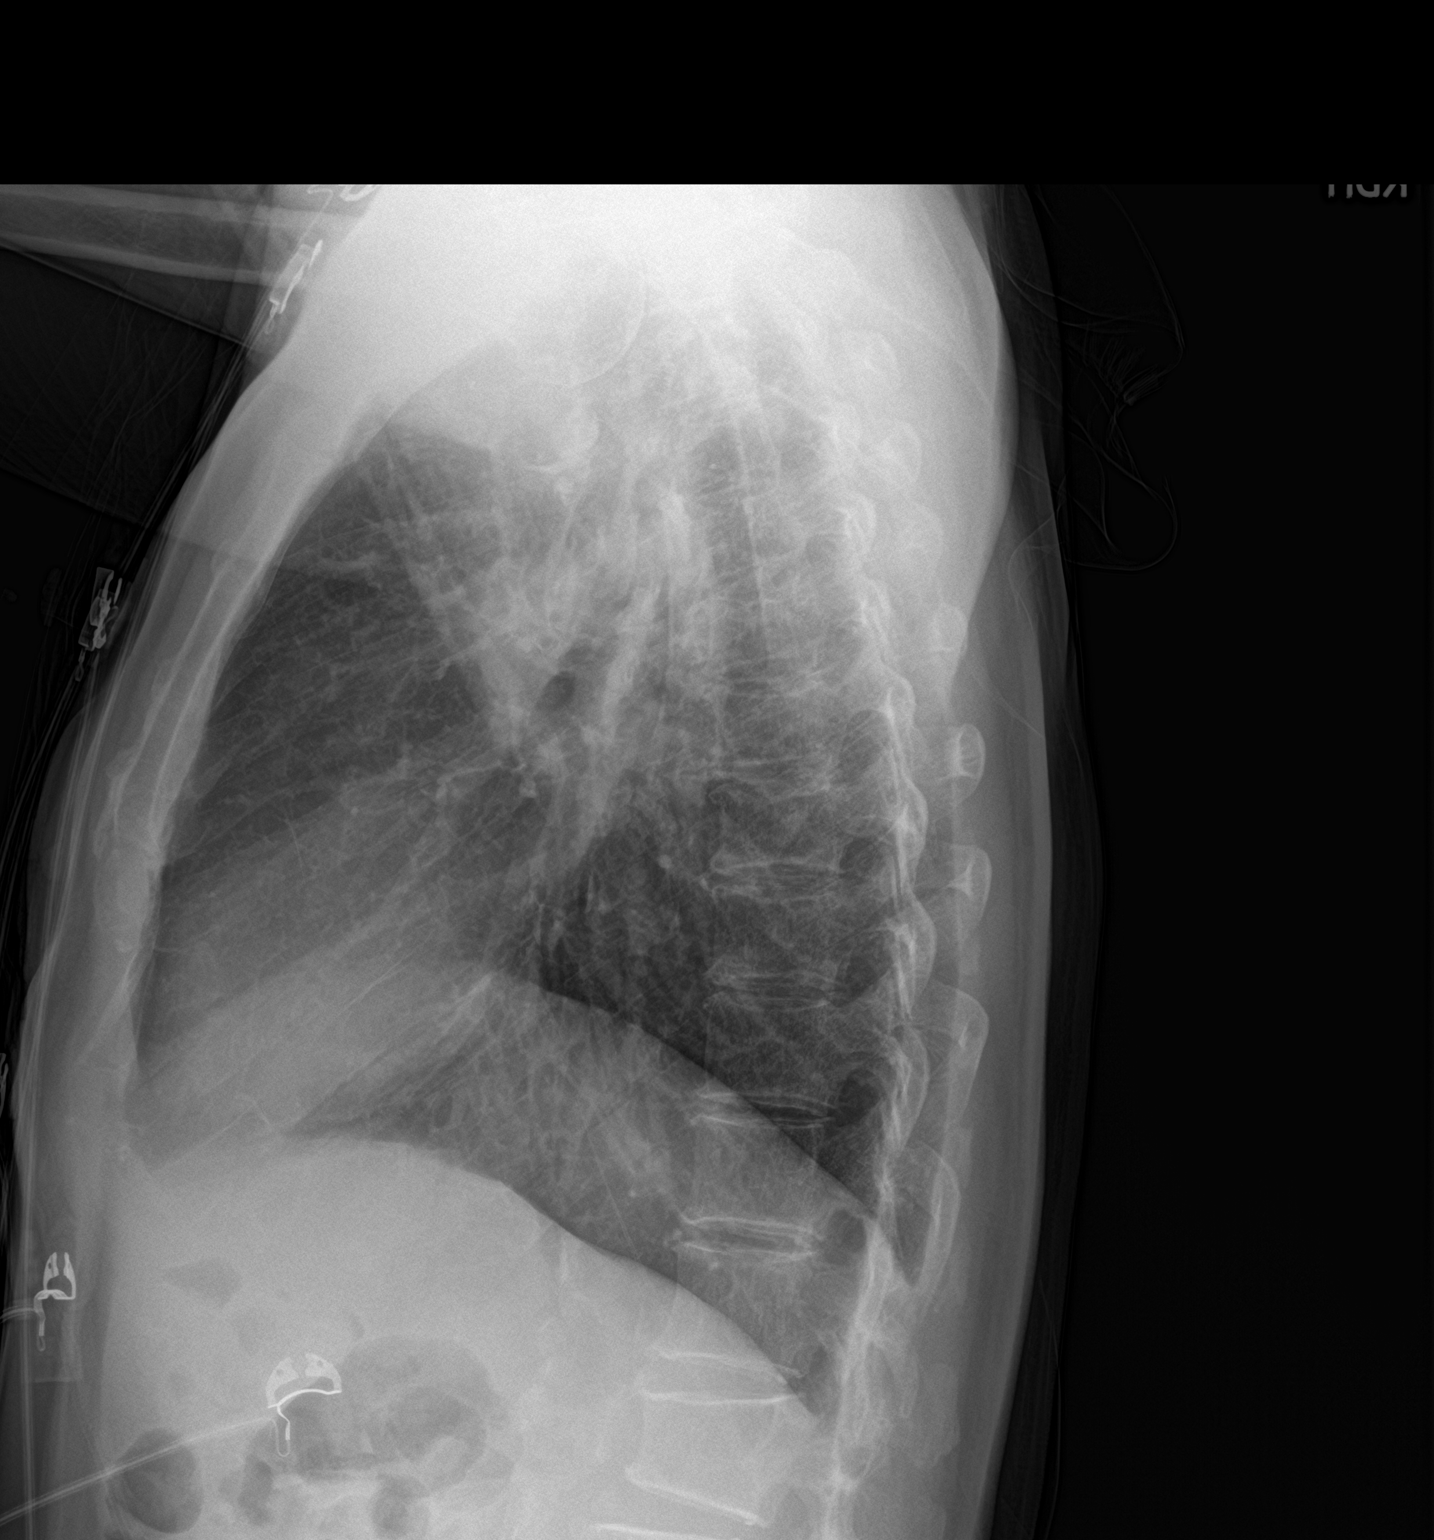

[2 of 2 positions shown; findings below may reference images not displayed]

FINDINGS: Heart size stable. Tortuous thoracic aorta again noted. Stable
changes of pleural-parenchymal scarring right upper lung again
noted. Stable calcified nodule right upper lung consistent
granuloma. No acute infiltrate. No pleural effusion or pneumothorax.
Degenerative change thoracic spine.
IMPRESSION: Stable changes of pleural-parenchymal scarring right upper lung. No
acute abnormality identified.

## 2020-09-02 MED ORDER — ACETAMINOPHEN 325 MG PO TABS
650.0000 mg | ORAL_TABLET | Freq: Four times a day (QID) | ORAL | Status: DC | PRN
  Administered 2020-09-02 – 2020-09-04 (×2): 650 mg via ORAL
  Filled 2020-09-02 (×2): qty 2

## 2020-09-02 MED ORDER — METHYLPREDNISOLONE SODIUM SUCC 125 MG IJ SOLR
125.0000 mg | Freq: Once | INTRAMUSCULAR | Status: AC
  Administered 2020-09-02: 125 mg via INTRAVENOUS
  Filled 2020-09-02: qty 2

## 2020-09-02 MED ORDER — ATORVASTATIN CALCIUM 20 MG PO TABS
20.0000 mg | ORAL_TABLET | Freq: Every day | ORAL | Status: DC
  Administered 2020-09-02 – 2020-09-04 (×3): 20 mg via ORAL
  Filled 2020-09-02 (×3): qty 1

## 2020-09-02 MED ORDER — ENOXAPARIN SODIUM 40 MG/0.4ML IJ SOSY
40.0000 mg | PREFILLED_SYRINGE | INTRAMUSCULAR | Status: DC
  Administered 2020-09-02 – 2020-09-04 (×3): 40 mg via SUBCUTANEOUS
  Filled 2020-09-02 (×4): qty 0.4

## 2020-09-02 MED ORDER — ALBUTEROL SULFATE (2.5 MG/3ML) 0.083% IN NEBU: 2.5000 mg | INHALATION_SOLUTION | RESPIRATORY_TRACT | Status: DC | PRN

## 2020-09-02 MED ORDER — ASPIRIN 81 MG PO CHEW
81.0000 mg | CHEWABLE_TABLET | Freq: Every day | ORAL | Status: DC
  Administered 2020-09-02 – 2020-09-04 (×3): 81 mg via ORAL
  Filled 2020-09-02 (×3): qty 1

## 2020-09-02 MED ORDER — IRBESARTAN 150 MG PO TABS
150.0000 mg | ORAL_TABLET | Freq: Every day | ORAL | Status: DC
  Administered 2020-09-02 – 2020-09-04 (×3): 150 mg via ORAL
  Filled 2020-09-02 (×4): qty 1

## 2020-09-02 MED ORDER — TAMSULOSIN HCL 0.4 MG PO CAPS
0.8000 mg | ORAL_CAPSULE | Freq: Every day | ORAL | Status: DC
  Administered 2020-09-02 – 2020-09-04 (×3): 0.8 mg via ORAL
  Filled 2020-09-02 (×3): qty 2

## 2020-09-02 MED ORDER — UMECLIDINIUM BROMIDE 62.5 MCG/INH IN AEPB
1.0000 | INHALATION_SPRAY | Freq: Every day | RESPIRATORY_TRACT | Status: DC
  Administered 2020-09-02 – 2020-09-04 (×2): 1 via RESPIRATORY_TRACT
  Filled 2020-09-02: qty 7

## 2020-09-02 MED ORDER — FLUTICASONE FUROATE-VILANTEROL 100-25 MCG/INH IN AEPB
1.0000 | INHALATION_SPRAY | Freq: Every day | RESPIRATORY_TRACT | Status: DC
  Administered 2020-09-02 – 2020-09-04 (×2): 1 via RESPIRATORY_TRACT
  Filled 2020-09-02: qty 28

## 2020-09-02 MED ORDER — BACLOFEN 10 MG PO TABS
10.0000 mg | ORAL_TABLET | Freq: Three times a day (TID) | ORAL | Status: DC | PRN
  Filled 2020-09-02: qty 1

## 2020-09-02 MED ORDER — METOPROLOL TARTRATE 50 MG PO TABS
50.0000 mg | ORAL_TABLET | Freq: Two times a day (BID) | ORAL | Status: DC
  Administered 2020-09-02 – 2020-09-04 (×5): 50 mg via ORAL
  Filled 2020-09-02 (×5): qty 1

## 2020-09-02 MED ORDER — IPRATROPIUM-ALBUTEROL 0.5-2.5 (3) MG/3ML IN SOLN
3.0000 mL | Freq: Once | RESPIRATORY_TRACT | Status: AC
  Administered 2020-09-02: 3 mL via RESPIRATORY_TRACT
  Filled 2020-09-02: qty 3

## 2020-09-02 MED ORDER — FLUTICASONE-UMECLIDIN-VILANT 100-62.5-25 MCG/INH IN AEPB: 1.0000 | INHALATION_SPRAY | Freq: Every day | RESPIRATORY_TRACT | Status: DC

## 2020-09-02 MED ORDER — HYDROCHLOROTHIAZIDE 25 MG PO TABS
12.5000 mg | ORAL_TABLET | Freq: Every day | ORAL | Status: DC
  Administered 2020-09-02 – 2020-09-04 (×3): 12.5 mg via ORAL
  Filled 2020-09-02 (×3): qty 1

## 2020-09-02 MED ORDER — SODIUM CHLORIDE 0.45 % IV SOLN: INTRAVENOUS | Status: DC

## 2020-09-02 MED ORDER — BENZONATATE 100 MG PO CAPS
200.0000 mg | ORAL_CAPSULE | Freq: Three times a day (TID) | ORAL | Status: DC
  Administered 2020-09-02 – 2020-09-04 (×6): 200 mg via ORAL
  Filled 2020-09-02 (×6): qty 2

## 2020-09-02 MED ORDER — SODIUM CHLORIDE 0.45 % IV SOLN: INTRAVENOUS | Status: AC

## 2020-09-02 MED ORDER — METHYLPREDNISOLONE SODIUM SUCC 40 MG IJ SOLR
40.0000 mg | Freq: Two times a day (BID) | INTRAMUSCULAR | Status: AC
  Administered 2020-09-02 – 2020-09-03 (×2): 40 mg via INTRAVENOUS
  Filled 2020-09-02 (×2): qty 1

## 2020-09-02 MED ORDER — IPRATROPIUM-ALBUTEROL 0.5-2.5 (3) MG/3ML IN SOLN
3.0000 mL | Freq: Four times a day (QID) | RESPIRATORY_TRACT | Status: DC
  Administered 2020-09-02 – 2020-09-03 (×3): 3 mL via RESPIRATORY_TRACT
  Filled 2020-09-02 (×3): qty 3

## 2020-09-02 MED ORDER — MECLIZINE HCL 12.5 MG PO TABS
12.5000 mg | ORAL_TABLET | Freq: Three times a day (TID) | ORAL | Status: DC | PRN
Start: 2020-09-02 — End: 2020-09-04
  Filled 2020-09-02: qty 1

## 2020-09-02 MED ORDER — OXYBUTYNIN CHLORIDE ER 5 MG PO TB24
5.0000 mg | ORAL_TABLET | Freq: Every day | ORAL | Status: DC
  Administered 2020-09-02 – 2020-09-04 (×3): 5 mg via ORAL
  Filled 2020-09-02 (×4): qty 1

## 2020-09-02 MED ORDER — PREDNISONE 20 MG PO TABS: 40.0000 mg | ORAL_TABLET | Freq: Every day | ORAL | Status: DC

## 2020-09-02 NOTE — H&P (Signed)
History and Physical    Jonathan Willis PQZ:[id number] DOB: 07/11/1945 DOA: 09/02/2020  PCP: Jearld Fenton, NP   Patient coming from: Home  I have personally briefly reviewed patient's old medical records in [place]  Chief Complaint: Shortness of breath  Most of the history was obtained from the EMR.  Patient's daughter was able to translate at his request.  HPI: Jonathan Willis is a 75 y.o. male with medical history significant for hypertension, diabetes mellitus, COPD and chronic respiratory failure who presents to the emergency room via private vehicle for evaluation of cough and worsening shortness of breath. Patient has had complaints of cough which is dry and productive associated with shortness of breath initially with exertion and some chest tightness. He had seen his pulmonologist 2 days prior to this hospitalization and had received a dose of IM steroids without any significant improvement in his symptoms. He denies having any abdominal pain, no fever, no chills, no leg swelling, no nausea, no vomiting, no urinary symptoms, no palpitations, no blurred vision, no focal deficits. Labs show sodium 139, potassium 4.0, chloride 100, bicarb 27, glucose 84, BUN 22, creatinine 0.82, calcium 9.4, troponin 5, white count 6.7, hemoglobin 15.7, hematocrit 46.3, MCV 83.3, RDW 12.7, platelet count 229, D-dimer 0.28 Respiratory viral panel is still pending Chest x-ray reviewed by me shows stable changes of pleural-parenchymal scarring right upper lung. No acute abnormality identified. Twelve-lead EKG reviewed by me shows sinus tach with PACs.    ED Course: Patient is a 75 year old [place] male with a history of COPD and chronic respiratory failure who presents to the ER for evaluation of worsening cough and shortness of breath. On exam he has diffuse wheezes and will be admitted to the hospital for acute COPD exacerbation.  Review of Systems: As per HPI otherwise all other systems reviewed and  negative.    Past Medical History:  Diagnosis Date  . Chronic cough 01/16/2017  . Diabetes mellitus without complication ([place])   . External hemorrhoid   . 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 History of TB (tuberculosis)   . Hypertension   . Lower GI bleed     Past Surgical History:  Procedure Laterality Date  . COLONOSCOPY WITH PROPOFOL N/A 04/10/2017   Procedure: COLONOSCOPY WITH PROPOFOL;  Surgeon: [name], MD;  Location: [facility] ENDOSCOPY;  Service: Gastroenterology;  Laterality: N/A;  . NO PAST SURGERIES       reports that he quit smoking about 13 years ago. His smoking use included cigarettes. He has a 66.00 pack-year smoking history. He has quit using smokeless tobacco. He reports that he does not drink alcohol and does not use drugs.  No Known Allergies  Family History  Problem Relation Age of Onset  . COPD Neg Hx   . Diabetes Mellitus II Neg Hx   . Hypertension Neg Hx       Prior to Admission medications   Medication Sig Start Date End Date Taking? Authorizing Provider  ACCU-CHEK AVIVA PLUS test strip USE 1 TEST STRIP TO TEST BLOOD SUGAR DAILY 10/15/19   Malfi, Lupita Raider, FNP  Accu-Chek Softclix Lancets lancets USE TO TEST ONCE DAILY 10/16/18   Mikey College, NP  albuterol (VENTOLIN HFA) 108 (90 Base) MCG/ACT inhaler Inhale 2 puffs into the lungs every 6 (six) hours as needed for wheezing or shortness of breath. 07/10/19   Malfi, Lupita Raider, FNP  aspirin 81 MG chewable tablet Chew  81 mg by mouth daily.     [provider]  atorvastatin (LIPITOR) 20 MG tablet TAKE 1 TABLET BY MOUTH EVERY DAY 09/01/19   Malfi, Lupita Raider, FNP  baclofen (LIORESAL) 10 MG tablet TAKE 1 TABLET (10 MG TOTAL) BY MOUTH 3 (THREE) TIMES DAILY. AS NEEDED Patient not taking: No sig reported 04/09/20   Verl Bangs, FNP  fluconazole (DIFLUCAN) 100 MG tablet Take 100 mg by mouth daily. 06/02/20   [provider]   Fluticasone-Umeclidin-Vilant (TRELEGY ELLIPTA) 100-62.5-25 MCG/INH AEPB Inhale 1 puff into the lungs daily. 04/16/20   Malfi, Lupita Raider, FNP  hydrochlorothiazide (HYDRODIURIL) 12.5 MG tablet TAKE 1 TABLET BY MOUTH EVERY DAY 03/12/20   Malfi, Lupita Raider, FNP  ipratropium-albuterol (DUONEB) 0.5-2.5 (3) MG/3ML SOLN Inhale into the lungs. 01/31/20 01/25/21  [provider]  levofloxacin (LEVAQUIN) 500 MG tablet Take 1 tablet (500 mg total) by mouth daily. For 7 days 06/15/20   Olin Hauser, DO  meclizine (ANTIVERT) 12.5 MG tablet Take 1 tablet (12.5 mg total) by mouth 3 (three) times daily as needed for dizziness. 08/21/20   Karamalegos, Devonne Doughty, DO  metoprolol tartrate (LOPRESSOR) 50 MG tablet Take 1 tablet (50 mg total) by mouth 2 (two) times daily. 04/11/19   Karamalegos, Devonne Doughty, DO  oxybutynin (DITROPAN-XL) 5 MG 24 hr tablet TAKE 1 TABLET BY MOUTH EVERY DAY 04/06/20   Malfi, Lupita Raider, FNP  predniSONE (DELTASONE) 20 MG tablet Take daily with food. Start with 60mg  (3 pills) x 2 days, then reduce to 40mg  (2 pills) x 2 days, then 20mg  (1 pill) x 3 days 06/15/20   Olin Hauser, DO  predniSONE (DELTASONE) 50 MG tablet Take 1 tablet (50 mg total) by mouth daily with breakfast. 07/20/20   Vladimir Crofts, MD  promethazine-dextromethorphan (PROMETHAZINE-DM) 6.25-15 MG/5ML syrup Take 5 mLs by mouth 4 (four) times daily as needed for cough. Patient not taking: No sig reported 01/29/20   Malfi, Lupita Raider, FNP  Spacer/Aero-Hold Chamber Bags MISC 1 Device by Does not apply route every 6 (six) hours as needed (with inhaler). 01/10/17   Mikey College, NP  tamsulosin (FLOMAX) 0.4 MG CAPS capsule TAKE 2 CAPSULES BY MOUTH EVERY DAY 04/06/20   Malfi, Lupita Raider, FNP  telmisartan (MICARDIS) 40 MG tablet Take 1 tablet (40 mg total) by mouth daily. 09/01/20   Jearld Fenton, NP    Physical Exam: Vitals:   09/02/20 0800 09/02/20 0930 09/02/20 0959 09/02/20 1007  BP: (!) 162/68 (!) 154/68     Pulse: (!) 111 (!) 118 (!) 114 (!) 125  Resp: 18 (!) 29 15   Temp:      TempSrc:      SpO2: 100% 95% 99%   Weight:      Height:         Vitals:   09/02/20 0800 09/02/20 0930 09/02/20 0959 09/02/20 1007  BP: (!) 162/68 (!) 154/68    Pulse: (!) 111 (!) 118 (!) 114 (!) 125  Resp: 18 (!) 29 15   Temp:      TempSrc:      SpO2: 100% 95% 99%   Weight:      Height:          Constitutional: Alert and oriented x 3 . Not in any apparent distress. Thin and Frail HEENT:      Head: Normocephalic and atraumatic.         Eyes: PERLA, EOMI, Conjunctivae are normal. Sclera is non-icteric.  Mouth/Throat: Mucous membranes are moist.       Neck: Supple with no signs of meningismus. Cardiovascular:  Tachycardia. No murmurs, gallops, or rubs. 2+ symmetrical distal pulses are present . No JVD. No LE edema Respiratory:  Tachypnea.bilateral air entry lung fields. Diffuse wheezes, no crackles, or rhonchi.  Gastrointestinal: Soft, non tender, and non distended with positive bowel sounds.  Genitourinary: No CVA tenderness. Musculoskeletal: Nontender with normal range of motion in all extremities. No cyanosis, or erythema of extremities. Neurologic:  Face is symmetric. Moving all extremities. No gross focal neurologic deficits . Skin: Skin is warm, dry.  No rash or ulcers Psychiatric: Mood and affect are normal   Labs on Admission: I have personally reviewed following labs and imaging studies  CBC: Recent Labs  Lab 09/02/20 0727  WBC 6.7  HGB 15.7  HCT 46.3  MCV 93.3  PLT 478   Basic Metabolic Panel: Recent Labs  Lab 09/02/20 0727  NA 139  K 4.0  CL 100  CO2 27  GLUCOSE 84  BUN 22  CREATININE 0.82  CALCIUM 9.4   GFR: Estimated Creatinine Clearance: 53.1 mL/min (by C-G formula based on SCr of 0.82 mg/dL). Liver Function Tests: No results for input(s): AST, ALT, ALKPHOS, BILITOT, PROT, ALBUMIN in the last 168 hours. No results for input(s): LIPASE, AMYLASE in the last 168  hours. No results for input(s): AMMONIA in the last 168 hours. Coagulation Profile: No results for input(s): INR, PROTIME in the last 168 hours. Cardiac Enzymes: No results for input(s): CKTOTAL, CKMB, CKMBINDEX, TROPONINI in the last 168 hours. BNP (last 3 results) No results for input(s): PROBNP in the last 8760 hours. HbA1C: No results for input(s): HGBA1C in the last 72 hours. CBG: No results for input(s): GLUCAP in the last 168 hours. Lipid Profile: No results for input(s): CHOL, HDL, LDLCALC, TRIG, CHOLHDL, LDLDIRECT in the last 72 hours. Thyroid Function Tests: No results for input(s): TSH, T4TOTAL, FREET4, T3FREE, THYROIDAB in the last 72 hours. Anemia Panel: No results for input(s): VITAMINB12, FOLATE, FERRITIN, TIBC, IRON, RETICCTPCT in the last 72 hours. Urine analysis:    Component Value Date/Time   COLORURINE STRAW (A) 07/20/2020 1010   APPEARANCEUR CLEAR (A) 07/20/2020 1010   APPEARANCEUR Clear 02/26/2018 0834   LABSPEC 1.006 07/20/2020 1010   LABSPEC 1.023 02/01/2014 2216   PHURINE 6.0 07/20/2020 1010   GLUCOSEU 150 (A) 07/20/2020 1010   GLUCOSEU 50 mg/dL 02/01/2014 2216   HGBUR NEGATIVE 07/20/2020 1010   BILIRUBINUR NEGATIVE 07/20/2020 1010   BILIRUBINUR negative 07/10/2019 0845   BILIRUBINUR Negative 02/26/2018 0834   BILIRUBINUR Negative 02/01/2014 2216   KETONESUR NEGATIVE 07/20/2020 1010   PROTEINUR NEGATIVE 07/20/2020 1010   UROBILINOGEN 0.2 07/10/2019 0845   NITRITE NEGATIVE 07/20/2020 1010   LEUKOCYTESUR NEGATIVE 07/20/2020 1010   LEUKOCYTESUR Negative 02/01/2014 2216    Radiological Exams on Admission: DG Chest 2 View  Result Date: 09/02/2020 CLINICAL DATA:  Chest pain.  Shortness of breath. EXAM: CHEST - 2 VIEW COMPARISON:  Chest x-ray 07/20/2020, 01/29/2020, 06/03/2017, 01/10/2017. FINDINGS: Heart size stable. Tortuous thoracic aorta again noted. Stable changes of pleural-parenchymal scarring right upper lung again noted. Stable calcified nodule  right upper lung consistent granuloma. No acute infiltrate. No pleural effusion or pneumothorax. Degenerative change thoracic spine. IMPRESSION: Stable changes of pleural-parenchymal scarring right upper lung. No acute abnormality identified. Electronically Signed   By: Marcello Moores  Register   On: 09/02/2020 07:51     Assessment/Plan Principal Problem:   COPD with acute exacerbation (  [place]) Active Problems:   Essential hypertension   Centrilobular emphysema (HCC)   Chronic respiratory failure ([facility])      COPD with acute exacerbation Patient has a history of centrilobular emphysema and chronic respiratory failure on 3 L of oxygen He presents to the emergency room for evaluation of worsening shortness of breath and a nonproductive cough associated with diffuse wheezes and chest tightness We will place patient on systemic and inhaled Place patient on scheduled and as needed bronchodilator therapy Continue oxygen supplementation to maintain pulse oximetry greater than 92% Add antitussives     Hypertension Continue hydrochlorothiazide, Avapro and metoprolol    BPH Continue Flomax DVT prophylaxis: Lovenox Code Status: full code Family Communication: Greater than 50% of time was spent discussing patient's condition and plan of care with his daughter Kirstie Mirza over the phone.  All questions and concerns have been addressed.  She verbalizes understanding and agrees with the plan. Disposition Plan: Back to previous home environment Consults called: none Status: Observation    Parnika Tweten MD [facility]     09/02/2020, 10:30 AM

## 2020-09-02 NOTE — Evaluation (Signed)
Occupational Therapy Evaluation Patient Details Name: Jonathan Willis MRN: [id number] DOB: 09/09/1945 Today's Date: 09/02/2020    History of Present Illness 75 y.o. male with medical history significant for hypertension, diabetes mellitus, COPD and chronic respiratory failure who presents to the emergency room via private vehicle for evaluation of cough and worsening shortness of breath.  Patient has had complaints of cough which is dry and productive associated with shortness of breath initially with exertion and some chest tightness.   Clinical Impression   Upon entering the room, pt supine on ED stretcher. Use of video interpreter this session for pt's primary language of vietnamese. Pt verbalized living at home with family in 2 story level entry home. His room is downstairs and he reports performing self care and IADL tasks without assistance or AD at baseline. He does endorse use of walking stick in yard. Pt has home O2 that he uses when needed but reports mostly at night. He was placed on RA and he donned B shoes and ambulates to bathroom for toileting needs , LB dressing, and ambulates and additional 600' at mod I level without AD and without LOB. Pt's O2 saturation remains at 98% on RA. RN notified. Pt with no skilled OT need at this time. OT to SIGN OFF.     Follow Up Recommendations  No OT follow up    Equipment Recommendations  None recommended by OT       Precautions / Restrictions Precautions Precautions: Fall      Mobility Bed Mobility Overal bed mobility: Independent                  Transfers Overall transfer level: Modified independent Equipment used: None             General transfer comment: no LOB    Balance Overall balance assessment: Modified Independent                                         ADL either performed or assessed with clinical judgement   ADL Overall ADL's : Modified independent                                        General ADL Comments: increased time but no LOB     Vision Patient Visual Report: No change from baseline              Pertinent Vitals/Pain Pain Assessment: Faces Faces Pain Scale: No hurt     Hand Dominance Right   Extremity/Trunk Assessment Upper Extremity Assessment Upper Extremity Assessment: Overall WFL for tasks assessed   Lower Extremity Assessment Lower Extremity Assessment: Overall WFL for tasks assessed   Cervical / Trunk Assessment Cervical / Trunk Assessment: Normal   Communication Communication Communication: Prefers language other than English (vietnamese)   Cognition Arousal/Alertness: Awake/alert Behavior During Therapy: WFL for tasks assessed/performed Overall Cognitive Status: Within Functional Limits for tasks assessed                                                Home Living Family/patient expects to be discharged to:: Private residence Living Arrangements: Children Available Help at Discharge: Family;Available PRN/intermittently Type  of Home: House Home Access: Level entry     Home Layout: Two level;Able to live on main level with bedroom/bathroom Alternate Level Stairs-Number of Steps: flight -14   Bathroom Shower/Tub: Walk-in shower         Home Equipment: None   Additional Comments: Pt reports use of bamboo walking stick when out in yard.      Prior Functioning/Environment Level of Independence: Independent        Comments: Pt reports I in self care and IADL tasks. His family works during the day so he cares for himself. He has home oxygen that he "uses when he needs it "(mostly at night).                 OT Goals(Current goals can be found in the care plan section) Acute Rehab OT Goals Patient Stated Goal: to go home OT Goal Formulation: With patient Time For Goal Achievement: 09/02/20 Potential to Achieve Goals: Good  OT Frequency:      AM-PAC OT "6 Clicks" Daily Activity      Outcome Measure Help from another person eating meals?: None Help from another person taking care of personal grooming?: None Help from another person toileting, which includes using toliet, bedpan, or urinal?: None Help from another person bathing (including washing, rinsing, drying)?: None Help from another person to put on and taking off regular upper body clothing?: None Help from another person to put on and taking off regular lower body clothing?: None 6 Click Score: 24   End of Session Nurse Communication: Mobility status;Other (comment) (left on RA)  Activity Tolerance: Patient tolerated treatment well Patient left: in bed;with call bell/phone within reach                   Time: 5400-8676 OT Time Calculation (min): 21 min Charges:  OT General Charges $OT Visit: 1 Visit OT Evaluation $OT Eval Low Complexity: 1 Low OT Treatments $Self Care/Home Management : 8-22 mins  Darleen Crocker, MS, OTR/L , CBIS ascom [phone]  09/02/20, 3:44 PM

## 2020-09-02 NOTE — ED Notes (Signed)
Informed Dr Francine Graven of tachycardia with HR of 119. Applied 3L oxygen to pt per order as pt wears 3L at home.

## 2020-09-02 NOTE — ED Triage Notes (Signed)
Pt comes into the ED via POV c/o COPD exacerbation.  Pt has had increased SHOB and cough and chest pain that started 10 days ago.  Pt presents with audible wheezing.  Pt has labored breathing.  Pt did receive a steroid shot on Monday at his MD's office, but the [facility] is getting worse.  Pt denies any known swelling.

## 2020-09-02 NOTE — ED Provider Notes (Signed)
[facility] Emergency Department Provider Note   ____________________________________________    I have reviewed the triage vital signs and the nursing notes.   HISTORY  Chief Complaint COPD   Daughter is translating for the patient at his request  HPI Jonathan Willis is a 75 y.o. male with history of diabetes, COPD who presents with worsening shortness of breath over the last week.  Did receive a shot of steroids 2 days ago at his pulmonologist office but has not improved significantly.  Denies fevers or chills.  Positive dry cough.  Some chest tightness.  No leg swelling or pain.  No nausea vomiting or diaphoresis.  Past Medical History:  Diagnosis Date  . Chronic cough 01/16/2017  . Diabetes mellitus without complication ([place])   . External hemorrhoid   . 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 History of TB (tuberculosis)   . Hypertension   . Lower GI bleed     Patient Active Problem List   Diagnosis Date Noted  . Sinus pressure 01/13/2020  . COPD with acute exacerbation ([place]) 07/10/2019  . Vertigo 05/15/2018  . Urinary frequency 02/26/2018  . Nocturia 02/26/2018  . Occipital neuralgia of right side 09/05/2017  . Headache 07/01/2017  . Carotid stenosis 07/01/2017  . Benign prostatic hyperplasia with incomplete bladder emptying 03/23/2017  . Chronic right shoulder pain 01/16/2017  . Essential hypertension 01/16/2017  . Type 2 diabetes mellitus with other specified complication ([place]) 69/48/5462  . Dyslipidemia associated with type 2 diabetes mellitus ([place]) 01/16/2017  . Centrilobular emphysema ([place]) 01/16/2017    Past Surgical History:  Procedure Laterality Date  . COLONOSCOPY WITH PROPOFOL N/A 04/10/2017   Procedure: COLONOSCOPY WITH PROPOFOL;  Surgeon: [name], MD;  Location: [facility] ENDOSCOPY;  Service: Gastroenterology;  Laterality: N/A;  . NO PAST SURGERIES      Prior to Admission  medications   Medication Sig Start Date End Date Taking? Authorizing Provider  ACCU-CHEK AVIVA PLUS test strip USE 1 TEST STRIP TO TEST BLOOD SUGAR DAILY 10/15/19   Malfi, Lupita Raider, FNP  Accu-Chek Softclix Lancets lancets USE TO TEST ONCE DAILY 10/16/18   Mikey College, NP  albuterol (VENTOLIN HFA) 108 (90 Base) MCG/ACT inhaler Inhale 2 puffs into the lungs every 6 (six) hours as needed for wheezing or shortness of breath. 07/10/19   Malfi, Lupita Raider, FNP  aspirin 81 MG chewable tablet Chew 81 mg by mouth daily.     [provider]  atorvastatin (LIPITOR) 20 MG tablet TAKE 1 TABLET BY MOUTH EVERY DAY 09/01/19   Malfi, Lupita Raider, FNP  baclofen (LIORESAL) 10 MG tablet TAKE 1 TABLET (10 MG TOTAL) BY MOUTH 3 (THREE) TIMES DAILY. AS NEEDED Patient not taking: No sig reported 04/09/20   Verl Bangs, FNP  fluconazole (DIFLUCAN) 100 MG tablet Take 100 mg by mouth daily. 06/02/20   [provider]  Fluticasone-Umeclidin-Vilant (TRELEGY ELLIPTA) 100-62.5-25 MCG/INH AEPB Inhale 1 puff into the lungs daily. 04/16/20   Malfi, Lupita Raider, FNP  hydrochlorothiazide (HYDRODIURIL) 12.5 MG tablet TAKE 1 TABLET BY MOUTH EVERY DAY 03/12/20   Malfi, Lupita Raider, FNP  ipratropium-albuterol (DUONEB) 0.5-2.5 (3) MG/3ML SOLN Inhale into the lungs. 01/31/20 01/25/21  [provider]  levofloxacin (LEVAQUIN) 500 MG tablet Take 1 tablet (500 mg total) by mouth daily. For 7 days 06/15/20   Olin Hauser, DO  meclizine (ANTIVERT) 12.5 MG tablet Take 1 tablet (12.5 mg total)  by mouth 3 (three) times daily as needed for dizziness. 08/21/20   Karamalegos, Devonne Doughty, DO  metoprolol tartrate (LOPRESSOR) 50 MG tablet Take 1 tablet (50 mg total) by mouth 2 (two) times daily. 04/11/19   Karamalegos, Devonne Doughty, DO  oxybutynin (DITROPAN-XL) 5 MG 24 hr tablet TAKE 1 TABLET BY MOUTH EVERY DAY 04/06/20   Malfi, Lupita Raider, FNP  predniSONE (DELTASONE) 20 MG tablet Take daily with food. Start with 60mg  (3 pills) x  2 days, then reduce to 40mg  (2 pills) x 2 days, then 20mg  (1 pill) x 3 days 06/15/20   Olin Hauser, DO  predniSONE (DELTASONE) 50 MG tablet Take 1 tablet (50 mg total) by mouth daily with breakfast. 07/20/20   Vladimir Crofts, MD  promethazine-dextromethorphan (PROMETHAZINE-DM) 6.25-15 MG/5ML syrup Take 5 mLs by mouth 4 (four) times daily as needed for cough. Patient not taking: No sig reported 01/29/20   Malfi, Lupita Raider, FNP  Spacer/Aero-Hold Chamber Bags MISC 1 Device by Does not apply route every 6 (six) hours as needed (with inhaler). 01/10/17   Mikey College, NP  tamsulosin (FLOMAX) 0.4 MG CAPS capsule TAKE 2 CAPSULES BY MOUTH EVERY DAY 04/06/20   Malfi, Lupita Raider, FNP  telmisartan (MICARDIS) 40 MG tablet Take 1 tablet (40 mg total) by mouth daily. 09/01/20   Jearld Fenton, NP     Allergies Patient has no known allergies.  Family History  Problem Relation Age of Onset  . COPD Neg Hx   . Diabetes Mellitus II Neg Hx   . Hypertension Neg Hx     Social History Social History   Tobacco Use  . Smoking status: Former Smoker    Packs/day: 1.50    Years: 44.00    Pack years: 66.00    Types: Cigarettes    Quit date: 01/11/2007    Years since quitting: 13.6  . Smokeless tobacco: Former Network engineer  . Vaping Use: Never used  Substance Use Topics  . Alcohol use: No  . Drug use: No    Review of Systems  Constitutional: No fever/chills Eyes: No visual changes.  ENT: No sore throat. Cardiovascular: As above Respiratory: As above Gastrointestinal: No abdominal pain.  No nausea, no vomiting.   Genitourinary: Negative for dysuria. Musculoskeletal: Negative for back pain. Skin: Negative for rash. Neurological: Negative for headaches or weakness   ____________________________________________   PHYSICAL EXAM:  VITAL SIGNS: ED Triage Vitals  Enc Vitals Group     BP 09/02/20 0725 (!) 160/68     Pulse Rate 09/02/20 0725 (!) 108     Resp 09/02/20 0725 (!) 24      Temp 09/02/20 0725 99 F (37.2 C)     Temp Source 09/02/20 0725 Oral     SpO2 09/02/20 0725 100 %     Weight 09/02/20 0723 49 kg (108 lb)     Height 09/02/20 0723 1.549 m (5\' 1" )     Head Circumference --      Peak Flow --      Pain Score 09/02/20 0723 9     Pain Loc --      Pain Edu? --      Excl. in [place]? --     Constitutional: Alert   Nose: No congestion/rhinnorhea. Mouth/Throat: Mucous membranes are moist.   Neck:  Painless ROM Cardiovascular: Normal rate, regular rhythm. Grossly normal heart sounds.  Good peripheral circulation. Respiratory: Increased respiratory effort with tachypnea, no retractions.  Scattered mild wheezes Gastrointestinal:  Soft and nontender. No distention.  .  Musculoskeletal: No lower extremity tenderness nor edema.  Warm and well perfused Neurologic:  Normal speech and language. No gross focal neurologic deficits are appreciated.  Skin:  Skin is warm, dry and intact. No rash noted. Psychiatric: Mood and affect are normal. Speech and behavior are normal.  ____________________________________________   LABS (all labs ordered are listed, but only abnormal results are displayed)  Labs Reviewed  SARS CORONAVIRUS 2 (TAT 6-24 HRS)  BASIC METABOLIC PANEL  CBC  TROPONIN I (HIGH SENSITIVITY)  TROPONIN I (HIGH SENSITIVITY)   ____________________________________________  EKG  ED ECG REPORT I, Lavonia Drafts, the attending physician, personally viewed and interpreted this ECG.  Date: 09/02/2020  Rhythm: normal sinus rhythm QRS Axis: normal Intervals: normal ST/T Wave abnormalities: Nonspecific Narrative Interpretation: no evidence of acute ischemia  ____________________________________________  RADIOLOGY  Chest x-ray viewed by me, calcified granuloma right upper lobe ____________________________________________   PROCEDURES  Procedure(s) performed: No  Procedures   Critical Care performed:  No ____________________________________________   INITIAL IMPRESSION / ASSESSMENT AND PLAN / ED COURSE  Pertinent labs & imaging results that were available during my care of the patient were reviewed by me and considered in my medical decision making (see chart for details).  Patient presents with shortness of breath as detailed above, likely related to his chronic COPD.  Scattered wheezes on exam, tachypneic.  Will treat with Solu-Medrol, duo nebs, obtain labs, imaging and carefully monitor.  ----------------------------------------- 9:38 AM on 09/02/2020 -----------------------------------------  Patient's lab work is overall unremarkable, normal high-sensitivity opponent, chest x-ray unchanged from prior.  No significant improvement on reexam, patient will require admission, have discussed with Dr. Francine Graven    ____________________________________________   FINAL CLINICAL IMPRESSION(S) / ED DIAGNOSES  Final diagnoses:  COPD with acute exacerbation ([place])        Note:  This document was prepared using Dragon voice recognition software and may include unintentional dictation errors.   Lavonia Drafts, MD 09/02/20 [phone]

## 2020-09-02 NOTE — ED Notes (Signed)
Dee RN aware of assigned bed 

## 2020-09-02 NOTE — ED Notes (Signed)
EDP at bedside a few minutes ago.  Pt to xray now. Daughter in law at bedside.  Pt is 97% on RA. Normally wears 3L at home.

## 2020-09-02 NOTE — ED Notes (Signed)
O2 turned down to 1L. SPO2 is 100%.

## 2020-09-03 ENCOUNTER — Encounter: Payer: Self-pay | Admitting: Internal Medicine

## 2020-09-03 DIAGNOSIS — Z20822 Contact with and (suspected) exposure to covid-19: Secondary | ICD-10-CM | POA: Diagnosis present

## 2020-09-03 DIAGNOSIS — I10 Essential (primary) hypertension: Secondary | ICD-10-CM | POA: Diagnosis present

## 2020-09-03 DIAGNOSIS — Z7982 Long term (current) use of aspirin: Secondary | ICD-10-CM | POA: Diagnosis not present

## 2020-09-03 DIAGNOSIS — Z79899 Other long term (current) drug therapy: Secondary | ICD-10-CM | POA: Diagnosis not present

## 2020-09-03 DIAGNOSIS — Z8611 Personal history of tuberculosis: Secondary | ICD-10-CM | POA: Diagnosis not present

## 2020-09-03 DIAGNOSIS — Z7951 Long term (current) use of inhaled steroids: Secondary | ICD-10-CM | POA: Diagnosis not present

## 2020-09-03 DIAGNOSIS — N400 Enlarged prostate without lower urinary tract symptoms: Secondary | ICD-10-CM | POA: Diagnosis present

## 2020-09-03 DIAGNOSIS — E119 Type 2 diabetes mellitus without complications: Secondary | ICD-10-CM | POA: Diagnosis present

## 2020-09-03 DIAGNOSIS — J432 Centrilobular emphysema: Secondary | ICD-10-CM | POA: Diagnosis present

## 2020-09-03 DIAGNOSIS — J9611 Chronic respiratory failure with hypoxia: Secondary | ICD-10-CM

## 2020-09-03 DIAGNOSIS — J441 Chronic obstructive pulmonary disease with (acute) exacerbation: Secondary | ICD-10-CM | POA: Diagnosis present

## 2020-09-03 DIAGNOSIS — Z87891 Personal history of nicotine dependence: Secondary | ICD-10-CM | POA: Diagnosis not present

## 2020-09-03 DIAGNOSIS — Z9981 Dependence on supplemental oxygen: Secondary | ICD-10-CM | POA: Diagnosis not present

## 2020-09-03 LAB — GLUCOSE, CAPILLARY: Glucose-Capillary: 143 mg/dL — ABNORMAL HIGH (ref 70–99)

## 2020-09-03 MED ORDER — METHYLPREDNISOLONE SODIUM SUCC 40 MG IJ SOLR
40.0000 mg | Freq: Two times a day (BID) | INTRAMUSCULAR | Status: AC
  Administered 2020-09-03 – 2020-09-04 (×2): 40 mg via INTRAVENOUS
  Filled 2020-09-03 (×2): qty 1

## 2020-09-03 NOTE — Progress Notes (Signed)
Progress Note    Jonathan Willis  LZJ:[id number] DOB: 06-19-1945  DOA: 09/02/2020 PCP: Jearld Fenton, NP      Brief Narrative:    Medical records reviewed and are as summarized below:  Jonathan Willis is a 75 y.o. male with medical history significant for hypertension, type II DM, COPD, chronic hypoxic respiratory failure on home oxygen at night, who presented to the hospital because of cough, chest tightness and increasing shortness of breath.  He was admitted to the hospital for COPD exacerbation.  He was treated with IV steroids and bronchodilators.    Assessment/Plan:   Principal Problem:   COPD with acute exacerbation (HCC) Active Problems:   Essential hypertension   Centrilobular emphysema (HCC)   Chronic respiratory failure (HCC)   Body mass index is 20.41 kg/m.   COPD exacerbation: Continue IV steroids and bronchodilators.  Chronic hypoxic respiratory failure: Continue 2 L/min oxygen via nasal cannula at night.  Type II DM: Glucose levels are stable  Hypertension: Continue antihypertensives  Possible discharge to home tomorrow.  [employer] [place] translation service was used for this encounter.   Diet Order             Diet 2 gram sodium Room service appropriate? Yes; Fluid consistency: Thin  Diet effective now                      Consultants: None  Procedures: None    Medications:    aspirin  81 mg Oral Daily   atorvastatin  20 mg Oral Daily   benzonatate  200 mg Oral TID   enoxaparin (LOVENOX) injection  40 mg Subcutaneous Q24H   fluticasone furoate-vilanterol  1 puff Inhalation Daily   And   umeclidinium bromide  1 puff Inhalation Daily   hydrochlorothiazide  12.5 mg Oral Daily   irbesartan  150 mg Oral Daily   metoprolol tartrate  50 mg Oral BID   oxybutynin  5 mg Oral Daily   predniSONE  40 mg Oral Q breakfast   tamsulosin  0.8 mg Oral Daily   Continuous Infusions:   Anti-infectives (From admission, onward)    None               Family Communication/Anticipated D/C date and plan/Code Status   DVT prophylaxis: enoxaparin (LOVENOX) injection 40 mg Start: 09/02/20 1000     Code Status: Full Code  Family Communication: None Disposition Plan:    Status is: Observation  The patient will require care spanning > 2 midnights and should be moved to inpatient because: IV treatments appropriate due to intensity of illness or inability to take PO and Inpatient level of care appropriate due to severity of illness  Dispo: The patient is from: Home              Anticipated d/c is to: Home              Patient currently is not medically stable to d/c.   Difficult to place patient No           Subjective:   C/o cough and fatigue.  Breathing is a little better.  He said he uses home oxygen at night.  Objective:    Vitals:   09/03/20 0227 09/03/20 0339 09/03/20 0726 09/03/20 0806  BP:  (!) 138/56 123/62   Pulse:  82 74   Resp:  18 20   Temp:  97.6 F (36.4 C) 97.6 F (36.4 C)  TempSrc:  Oral Oral   SpO2: 94% 97% 96% 95%  Weight:      Height:       No data found.   Intake/Output Summary (Last 24 hours) at 09/03/2020 1006 Last data filed at 09/02/2020 1840 Gross per 24 hour  Intake 596.71 ml  Output --  Net 596.71 ml   Filed Weights   09/02/20 0723  Weight: 49 kg    Exam:  GEN: NAD SKIN: No rash EYES: EOMI ENT: MMM CV: RRR PULM: Decreased air entry bilaterally.  Mild expiratory wheezing ABD: soft, ND, NT, +BS CNS: AAO x 3, non focal EXT: No edema or tenderness        Data Reviewed:   I have personally reviewed following labs and imaging studies:  Labs: Labs show the following:   Basic Metabolic Panel: Recent Labs  Lab 09/02/20 0727  NA 139  K 4.0  CL 100  CO2 27  GLUCOSE 84  BUN 22  CREATININE 0.82  CALCIUM 9.4   GFR Estimated Creatinine Clearance: 53.1 mL/min (by C-G formula based on SCr of 0.82 mg/dL). Liver Function Tests: No results for  input(s): AST, ALT, ALKPHOS, BILITOT, PROT, ALBUMIN in the last 168 hours. No results for input(s): LIPASE, AMYLASE in the last 168 hours. No results for input(s): AMMONIA in the last 168 hours. Coagulation profile No results for input(s): INR, PROTIME in the last 168 hours.  CBC: Recent Labs  Lab 09/02/20 0727  WBC 6.7  HGB 15.7  HCT 46.3  MCV 93.3  PLT 229   Cardiac Enzymes: No results for input(s): CKTOTAL, CKMB, CKMBINDEX, TROPONINI in the last 168 hours. BNP (last 3 results) No results for input(s): PROBNP in the last 8760 hours. CBG: No results for input(s): GLUCAP in the last 168 hours. D-Dimer: Recent Labs    08/31/20 1026  DDIMER 0.28   Hgb A1c: No results for input(s): HGBA1C in the last 72 hours. Lipid Profile: No results for input(s): CHOL, HDL, LDLCALC, TRIG, CHOLHDL, LDLDIRECT in the last 72 hours. Thyroid function studies: No results for input(s): TSH, T4TOTAL, T3FREE, THYROIDAB in the last 72 hours.  Invalid input(s): FREET3 Anemia work up: No results for input(s): VITAMINB12, FOLATE, FERRITIN, TIBC, IRON, RETICCTPCT in the last 72 hours. Sepsis Labs: Recent Labs  Lab 09/02/20 0727  WBC 6.7    Microbiology Recent Results (from the past 240 hour(s))  SARS CORONAVIRUS 2 (TAT 6-24 HRS) Nasopharyngeal Nasopharyngeal Swab     Status: None   Collection Time: 09/02/20  9:30 AM   Specimen: Nasopharyngeal Swab  Result Value Ref Range Status   SARS Coronavirus 2 NEGATIVE NEGATIVE Final    Comment: (NOTE) SARS-CoV-2 target nucleic acids are NOT DETECTED.  The SARS-CoV-2 RNA is generally detectable in upper and lower respiratory specimens during the acute phase of infection. Negative results do not preclude SARS-CoV-2 infection, do not rule out co-infections with other pathogens, and should not be used as the sole basis for treatment or other patient management decisions. Negative results must be combined with clinical observations, patient history,  and epidemiological information. The expected result is Negative.  Fact Sheet for Patients: SugarRoll.be  Fact Sheet for Healthcare Providers: https://www.woods-mathews.com/  This test is not yet approved or cleared by the [place] FDA and  has been authorized for detection and/or diagnosis of SARS-CoV-2 by FDA under an Emergency Use Authorization (EUA). This EUA will remain  in effect (meaning this test can be used) for the duration of the COVID-19 declaration  under Se ction 564(b)(1) of the Act, 21 U.S.C. section 360bbb-3(b)(1), unless the authorization is terminated or revoked sooner.  Performed at [facility], [place] [address], [place], [place] [address]     Procedures and diagnostic studies:  DG Chest 2 View  Result Date: 09/02/2020 CLINICAL DATA:  Chest pain.  Shortness of breath. EXAM: CHEST - 2 VIEW COMPARISON:  Chest x-ray 07/20/2020, 01/29/2020, 06/03/2017, 01/10/2017. FINDINGS: Heart size stable. Tortuous thoracic aorta again noted. Stable changes of pleural-parenchymal scarring right upper lung again noted. Stable calcified nodule right upper lung consistent granuloma. No acute infiltrate. No pleural effusion or pneumothorax. Degenerative change thoracic spine. IMPRESSION: Stable changes of pleural-parenchymal scarring right upper lung. No acute abnormality identified. Electronically Signed   By: Marcello Moores  Register   On: 09/02/2020 07:51               LOS: 0 days   Jonathan Willis  [facility]   Pager on [email or web address]. If 7PM-7AM, please contact night-coverage at [email or web address]     09/03/2020, 10:06 AM

## 2020-09-03 NOTE — Care Management Obs Status (Signed)
[place] NOTIFICATION   Patient Details  Name: Jonathan Willis MRN: [id number] Date of Birth: 06/27/1945   Medicare Observation Status Notification Given:  Yes    Shelbie Hutching, RN 09/03/2020, 2:57 PM

## 2020-09-03 NOTE — Progress Notes (Signed)
Initial Nutrition Assessment  DOCUMENTATION CODES:  Not applicable  INTERVENTION:  Continue current diet as ordered Consider adding nutrition supplement if intake is consistently <75%  NUTRITION DIAGNOSIS:  Increased nutrient needs related to acute illness as evidenced by estimated needs.  GOAL: Patient will meet greater than or equal to 90% of their needs  MONITOR:  PO intake, Labs  REASON FOR ASSESSMENT:  Consult Assessment of nutrition requirement/status  ASSESSMENT:  Pt presented to ED with SOB and chest pain worsening for over a week PTA. Workup suggestive of a COPD exacerbation. PMH relevant for DM type 2, COPD, HTN, HLD.  Pt sleeping soundly at the time of assessment, did not disturb. Discussed pt in IDT rounds, MD states pt may be ready for discharge tomorrow. Reviewed intake and weight hx. No weight loss noted recently and has been stable for >1 year. 100% intake of meals so far.   Pt is likely meeting nutrition needs with diet alone. Will monitor for the need for additional interventions.   Average Meal Intake: 6/8-6/9: 100% intake x 2 recorded meals  Nutritionally Relevant Medications: Scheduled Meds:  aspirin  81 mg Oral Daily   atorvastatin  20 mg Oral Daily   hydrochlorothiazide  12.5 mg Oral Daily   predniSONE  40 mg Oral Q breakfast   PRN Meds: baclofen, meclizine  Labs reviewed  NUTRITION - FOCUSED PHYSICAL EXAM: Defer at this time  Diet Order:   Diet Order             Diet 2 gram sodium Room service appropriate? Yes; Fluid consistency: Thin  Diet effective now                   EDUCATION NEEDS:  No education needs have been identified at this time  Skin:  Skin Assessment: Reviewed RN Assessment  Last BM:  6/8 per pt report  Height:  Ht Readings from Last 1 Encounters:  09/02/20 5\' 1"  (1.549 m)   Weight:  Wt Readings from Last 1 Encounters:  09/02/20 49 kg    Ideal Body Weight:  50.9 kg  BMI:  Body mass index is 20.41  kg/m.  Estimated Nutritional Needs:  Kcal:  1300-1500 kcal/d Protein:  70-80g/d Fluid:  >1500 mL/d  Ranell Patrick, RD, LDN Clinical Dietitian Pager on [place]

## 2020-09-03 NOTE — TOC Initial Note (Signed)
Transition of Care [facility]) - Initial/Assessment Note    Patient Details  Name: Jonathan Willis MRN: [id number] Date of Birth: 01/01/1946  Transition of Care [facility]) CM/SW Contact:    Shelbie Hutching, RN Phone Number: 09/03/2020, 3:18 PM  Clinical Narrative:                 Patient placed under observation for COPD exacerbation.  RNCM met with patient at the bedside to review [place] letter.  Video [place] interpreter utilized for interview.  Patient lives with son and daughter in law, he is independent at home.  He is current with his PCP and pulmonologist.  Patient is on Chronic O2 at 2L.  At discharge patient's son will pick him up.   Expected Discharge Plan: Home/Self Care Barriers to Discharge: Continued Medical Work up   Patient Goals and CMS Choice        Expected Discharge Plan and Services Expected Discharge Plan: Home/Self Care       Living arrangements for the past 2 months: Single Family Home                 DME Arranged: N/A DME Agency: NA       HH Arranged: NA HH Agency: NA        Prior Living Arrangements/Services Living arrangements for the past 2 months: Single Family Home Lives with:: Adult Children Patient language and need for interpreter reviewed:: Yes ([place] interpreter used) Do you feel safe going back to the place where you live?: Yes      Need for Family Participation in Patient Care: Yes (Comment) (COPD) Care giver support system in place?: Yes (comment) (son and daughter) Current home services: DME (oxygen) Criminal Activity/Legal Involvement Pertinent to Current Situation/Hospitalization: No - Comment as needed  Activities of Daily Living Home Assistive Devices/Equipment: None ADL Screening (condition at time of admission) Patient's cognitive ability adequate to safely complete daily activities?: Yes Is the patient deaf or have difficulty hearing?: No Does the patient have difficulty seeing, even when wearing glasses/contacts?: No Does the  patient have difficulty concentrating, remembering, or making decisions?: No Patient able to express need for assistance with ADLs?: Yes Does the patient have difficulty dressing or bathing?: No Independently performs ADLs?: Yes (appropriate for developmental age) Does the patient have difficulty walking or climbing stairs?: No Weakness of Legs: None Weakness of Arms/Hands: None  Permission Sought/Granted Permission sought to share information with : Family Supports Permission granted to share information with : Yes, Verbal Permission Granted  Share Information with NAME: Jonathan Willis     Permission granted to share info w Relationship: son     Emotional Assessment Appearance:: Appears stated age Attitude/Demeanor/Rapport: Engaged Affect (typically observed): Accepting Orientation: : Oriented to Self, Oriented to Place, Oriented to  Time, Oriented to Situation Alcohol / Substance Use: Not Applicable Psych Involvement: No (comment)  Admission diagnosis:  COPD with acute exacerbation ([place]) [J44.1] Patient Active Problem List   Diagnosis Date Noted   Sinus pressure 01/13/2020   COPD with acute exacerbation ([place]) 07/10/2019   Vertigo 05/15/2018   Urinary frequency 02/26/2018   Nocturia 02/26/2018   Occipital neuralgia of right side 09/05/2017   Headache 07/01/2017   Carotid stenosis 07/01/2017   Benign prostatic hyperplasia with incomplete bladder emptying 03/23/2017   Chronic respiratory failure ([place]) 01/21/2017   Chronic right shoulder pain 01/16/2017   Essential hypertension 01/16/2017   Type 2 diabetes mellitus with other specified complication ([place]) 72/53/6644   Dyslipidemia associated with type 2  diabetes mellitus (HCC) 01/16/2017   Centrilobular emphysema (HCC) 01/16/2017   PCP:  Baity, Regina W, NP Pharmacy:   [id number] - [place], [place] - [address] [place] [address] Phone: [phone] Fax: [phone]     Social Determinants of Health  (SDOH) Interventions    Readmission Risk Interventions No flowsheet data found.   

## 2020-09-04 MED ORDER — IPRATROPIUM-ALBUTEROL 0.5-2.5 (3) MG/3ML IN SOLN
3.0000 mL | Freq: Four times a day (QID) | RESPIRATORY_TRACT | 0 refills | Status: DC | PRN
Start: 2020-09-04 — End: 2020-09-22

## 2020-09-04 MED ORDER — PREDNISONE 20 MG PO TABS: 40.0000 mg | ORAL_TABLET | Freq: Every day | ORAL | 0 refills | Status: AC

## 2020-09-04 NOTE — Plan of Care (Signed)
Pt rested during overnight. Vitals stable. No tele events.  Problem: Education: Goal: Knowledge of General Education information will improve Description: Including pain rating scale, medication(s)/side effects and non-pharmacologic comfort measures Outcome: Progressing   Problem: Health Behavior/Discharge Planning: Goal: Ability to manage health-related needs will improve Outcome: Progressing   Problem: Clinical Measurements: Goal: Ability to maintain clinical measurements within normal limits will improve Outcome: Progressing Goal: Will remain free from infection Outcome: Progressing Goal: Diagnostic test results will improve Outcome: Progressing Goal: Respiratory complications will improve Outcome: Progressing Goal: Cardiovascular complication will be avoided Outcome: Progressing   Problem: Activity: Goal: Risk for activity intolerance will decrease Outcome: Progressing   Problem: Nutrition: Goal: Adequate nutrition will be maintained Outcome: Progressing   Problem: Coping: Goal: Level of anxiety will decrease Outcome: Progressing   Problem: Elimination: Goal: Will not experience complications related to bowel motility Outcome: Progressing Goal: Will not experience complications related to urinary retention Outcome: Progressing   Problem: Pain Managment: Goal: General experience of comfort will improve Outcome: Progressing   Problem: Safety: Goal: Ability to remain free from injury will improve Outcome: Progressing   Problem: Skin Integrity: Goal: Risk for impaired skin integrity will decrease Outcome: Progressing

## 2020-09-04 NOTE — Discharge Summary (Addendum)
Physician Discharge Summary  Zyeir Dymek XHB:[id number] DOB: 1945/06/28 DOA: 09/02/2020  PCP: Jearld Fenton, NP  Admit date: 09/02/2020 Discharge date: 09/04/2020  Discharge disposition: Home   Recommendations for Outpatient Follow-Up:   Follow-up with PCP in 1 week   Discharge Diagnosis:   Principal Problem:   COPD with acute exacerbation ([place]) Active Problems:   Essential hypertension   Centrilobular emphysema ([place])   Chronic respiratory failure ([place])    Discharge Condition: Stable.  Diet recommendation:  Diet Order             Diet - low sodium heart healthy           Diet Carb Modified           Diet 2 gram sodium Room service appropriate? Yes; Fluid consistency: Thin  Diet effective now                     Code Status: Full Code     Hospital Course:   Mr. Jakwan Sally is a 75 y.o. male with medical history significant for hypertension, type II DM, COPD, chronic hypoxic respiratory failure on home oxygen at night, who presented to the hospital because of cough, chest tightness and increasing shortness of breath.   He was admitted to the hospital for COPD exacerbation.  He was treated with IV steroids and bronchodilators.  His condition has improved and he is deemed stable for discharge to home today.  [employer] [place] interpretation service was used for this encounter.        Discharge Exam:    Vitals:   09/03/20 2110 09/03/20 2359 09/04/20 0411 09/04/20 0804  BP: 130/66 117/60 (!) 101/59 111/64  Pulse: 62 61 73 67  Resp:  16 20 15   Temp: (!) 97.4 F (36.3 C) (!) 97.4 F (36.3 C) 98.3 F (36.8 C) 98 F (36.7 C)  TempSrc: Oral Oral Oral   SpO2: 98% 97% 95% 95%  Weight:      Height:         GEN: NAD SKIN: No rash EYES: EOMI ENT: MMM CV: RRR PULM: CTA B ABD: soft, ND, NT, +BS CNS: AAO x 3, non focal EXT: No edema or tenderness   The results of significant diagnostics from this hospitalization (including imaging, microbiology,  ancillary and laboratory) are listed below for reference.     Procedures and Diagnostic Studies:   DG Chest 2 View  Result Date: 09/02/2020 CLINICAL DATA:  Chest pain.  Shortness of breath. EXAM: CHEST - 2 VIEW COMPARISON:  Chest x-ray 07/20/2020, 01/29/2020, 06/03/2017, 01/10/2017. FINDINGS: Heart size stable. Tortuous thoracic aorta again noted. Stable changes of pleural-parenchymal scarring right upper lung again noted. Stable calcified nodule right upper lung consistent granuloma. No acute infiltrate. No pleural effusion or pneumothorax. Degenerative change thoracic spine. IMPRESSION: Stable changes of pleural-parenchymal scarring right upper lung. No acute abnormality identified. Electronically Signed   By: Marcello Moores  Register   On: 09/02/2020 07:51     Labs:   Basic Metabolic Panel: Recent Labs  Lab 09/02/20 0727  NA 139  K 4.0  CL 100  CO2 27  GLUCOSE 84  BUN 22  CREATININE 0.82  CALCIUM 9.4   GFR Estimated Creatinine Clearance: 53.1 mL/min (by C-G formula based on SCr of 0.82 mg/dL). Liver Function Tests: No results for input(s): AST, ALT, ALKPHOS, BILITOT, PROT, ALBUMIN in the last 168 hours. No results for input(s): LIPASE, AMYLASE in the last 168 hours. No results for input(s): AMMONIA  in the last 168 hours. Coagulation profile No results for input(s): INR, PROTIME in the last 168 hours.  CBC: Recent Labs  Lab 09/02/20 0727  WBC 6.7  HGB 15.7  HCT 46.3  MCV 93.3  PLT 229   Cardiac Enzymes: No results for input(s): CKTOTAL, CKMB, CKMBINDEX, TROPONINI in the last 168 hours. BNP: Invalid input(s): POCBNP CBG: Recent Labs  Lab 09/03/20 2111  GLUCAP 143*   D-Dimer No results for input(s): DDIMER in the last 72 hours. Hgb A1c No results for input(s): HGBA1C in the last 72 hours. Lipid Profile No results for input(s): CHOL, HDL, LDLCALC, TRIG, CHOLHDL, LDLDIRECT in the last 72 hours. Thyroid function studies No results for input(s): TSH, T4TOTAL, T3FREE,  THYROIDAB in the last 72 hours.  Invalid input(s): FREET3 Anemia work up No results for input(s): VITAMINB12, FOLATE, FERRITIN, TIBC, IRON, RETICCTPCT in the last 72 hours. Microbiology Recent Results (from the past 240 hour(s))  SARS CORONAVIRUS 2 (TAT 6-24 HRS) Nasopharyngeal Nasopharyngeal Swab     Status: None   Collection Time: 09/02/20  9:30 AM   Specimen: Nasopharyngeal Swab  Result Value Ref Range Status   SARS Coronavirus 2 NEGATIVE NEGATIVE Final    Comment: (NOTE) SARS-CoV-2 target nucleic acids are NOT DETECTED.  The SARS-CoV-2 RNA is generally detectable in upper and lower respiratory specimens during the acute phase of infection. Negative results do not preclude SARS-CoV-2 infection, do not rule out co-infections with other pathogens, and should not be used as the sole basis for treatment or other patient management decisions. Negative results must be combined with clinical observations, patient history, and epidemiological information. The expected result is Negative.  Fact Sheet for Patients: SugarRoll.be  Fact Sheet for Healthcare Providers: https://www.woods-mathews.com/  This test is not yet approved or cleared by the [place] FDA and  has been authorized for detection and/or diagnosis of SARS-CoV-2 by FDA under an Emergency Use Authorization (EUA). This EUA will remain  in effect (meaning this test can be used) for the duration of the COVID-19 declaration under Se ction 564(b)(1) of the Act, 21 U.S.C. section 360bbb-3(b)(1), unless the authorization is terminated or revoked sooner.  Performed at [facility], [place] [address] [place], [place] [address]      Discharge Instructions:   Discharge Instructions     Diet - low sodium heart healthy   Complete by: As directed    Diet Carb Modified   Complete by: As directed    Increase activity slowly   Complete by: As directed       Allergies as of  09/04/2020   No Known Allergies      Medication List     STOP taking these medications    aspirin 81 MG chewable tablet   atorvastatin 20 MG tablet Commonly known as: LIPITOR   baclofen 10 MG tablet Commonly known as: LIORESAL   fluconazole 100 MG tablet Commonly known as: DIFLUCAN   hydrochlorothiazide 12.5 MG tablet Commonly known as: HYDRODIURIL   levofloxacin 500 MG tablet Commonly known as: LEVAQUIN   metoprolol tartrate 50 MG tablet Commonly known as: LOPRESSOR   promethazine-dextromethorphan 6.25-15 MG/5ML syrup Commonly known as: PROMETHAZINE-DM   sulfamethoxazole-trimethoprim 800-160 MG tablet Commonly known as: BACTRIM DS       TAKE these medications    Accu-Chek Aviva Plus test strip Generic drug: glucose blood USE 1 TEST STRIP TO TEST BLOOD SUGAR DAILY   Accu-Chek Softclix Lancets lancets USE TO TEST ONCE DAILY   albuterol 108 (  90 Base) MCG/ACT inhaler Commonly known as: VENTOLIN HFA Inhale 2 puffs into the lungs every 6 (six) hours as needed for wheezing or shortness of breath.   chlorpheniramine-HYDROcodone 10-8 MG/5ML Suer Commonly known as: TUSSIONEX Take 5 mLs by mouth every 12 (twelve) hours.   ipratropium-albuterol 0.5-2.5 (3) MG/3ML Soln Commonly known as: DUONEB Inhale 3 mLs into the lungs every 6 (six) hours as needed. What changed:  how much to take when to take this reasons to take this   meclizine 12.5 MG tablet Commonly known as: ANTIVERT Take 1 tablet (12.5 mg total) by mouth 3 (three) times daily as needed for dizziness.   oxybutynin 5 MG 24 hr tablet Commonly known as: DITROPAN-XL TAKE 1 TABLET BY MOUTH EVERY DAY   predniSONE 20 MG tablet Commonly known as: DELTASONE Take 2 tablets (40 mg total) by mouth daily with breakfast for 3 days. Take daily with food. Take 2 tabs for 3 days What changed:  how much to take how to take this when to take this additional instructions Another medication with the same name  was removed. Continue taking this medication, and follow the directions you see here.   Spacer/Aero-Hold Chamber Bags Misc 1 Device by Does not apply route every 6 (six) hours as needed (with inhaler).   tamsulosin 0.4 MG Caps capsule Commonly known as: FLOMAX TAKE 2 CAPSULES BY MOUTH EVERY DAY   telmisartan 40 MG tablet Commonly known as: MICARDIS Take 1 tablet (40 mg total) by mouth daily.   Trelegy Ellipta 100-62.5-25 MCG/INH Aepb Generic drug: Fluticasone-Umeclidin-Vilant Inhale 1 puff into the lungs daily.          Time coordinating discharge: 31 minutes  Signed:  Yaritza Leist  [facility] 09/04/2020, 10:57 AM   Pager on [email or web address]. If 7PM-7AM, please contact night-coverage at [email or web address]

## 2020-09-10 ENCOUNTER — Other Ambulatory Visit: Payer: Self-pay

## 2020-09-10 ENCOUNTER — Emergency Department
Admission: EM | Admit: 2020-09-10 | Discharge: 2020-09-10 | Disposition: A | Discharge status: Home / Self Care | Payer: Medicare Other | Attending: Emergency Medicine | Admitting: Emergency Medicine

## 2020-09-10 ENCOUNTER — Emergency Department: Payer: Medicare Other

## 2020-09-10 DIAGNOSIS — R059 Cough, unspecified: Secondary | ICD-10-CM | POA: Diagnosis not present

## 2020-09-10 DIAGNOSIS — Z7951 Long term (current) use of inhaled steroids: Secondary | ICD-10-CM | POA: Diagnosis not present

## 2020-09-10 DIAGNOSIS — Z87891 Personal history of nicotine dependence: Secondary | ICD-10-CM | POA: Insufficient documentation

## 2020-09-10 DIAGNOSIS — R0602 Shortness of breath: Secondary | ICD-10-CM | POA: Insufficient documentation

## 2020-09-10 DIAGNOSIS — M542 Cervicalgia: Secondary | ICD-10-CM | POA: Diagnosis not present

## 2020-09-10 DIAGNOSIS — R42 Dizziness and giddiness: Secondary | ICD-10-CM | POA: Diagnosis not present

## 2020-09-10 DIAGNOSIS — I10 Essential (primary) hypertension: Secondary | ICD-10-CM | POA: Insufficient documentation

## 2020-09-10 DIAGNOSIS — E119 Type 2 diabetes mellitus without complications: Secondary | ICD-10-CM | POA: Diagnosis not present

## 2020-09-10 DIAGNOSIS — R5383 Other fatigue: Secondary | ICD-10-CM | POA: Insufficient documentation

## 2020-09-10 DIAGNOSIS — J441 Chronic obstructive pulmonary disease with (acute) exacerbation: Secondary | ICD-10-CM | POA: Diagnosis not present

## 2020-09-10 DIAGNOSIS — R519 Headache, unspecified: Secondary | ICD-10-CM | POA: Diagnosis not present

## 2020-09-10 DIAGNOSIS — Z79899 Other long term (current) drug therapy: Secondary | ICD-10-CM | POA: Diagnosis not present

## 2020-09-10 LAB — CBC
HCT: 40.4 % (ref 39.0–52.0)
Hemoglobin: 14 g/dL (ref 13.0–17.0)
MCH: 31.9 pg (ref 26.0–34.0)
MCHC: 34.7 g/dL (ref 30.0–36.0)
MCV: 92 fL (ref 80.0–100.0)
Platelets: 264 10*3/uL (ref 150–400)
RBC: 4.39 MIL/uL (ref 4.22–5.81)
RDW: 11.9 % (ref 11.5–15.5)
WBC: 8.9 10*3/uL (ref 4.0–10.5)
nRBC: 0 % (ref 0.0–0.2)

## 2020-09-10 LAB — BASIC METABOLIC PANEL
Anion gap: 3 — ABNORMAL LOW (ref 5–15)
BUN: 17 mg/dL (ref 8–23)
CO2: 29 mmol/L (ref 22–32)
Calcium: 8.7 mg/dL — ABNORMAL LOW (ref 8.9–10.3)
Chloride: 99 mmol/L (ref 98–111)
Creatinine, Ser: 0.97 mg/dL (ref 0.61–1.24)
GFR, Estimated: >60 mL/min (ref >60)
Glucose, Bld: 176 mg/dL — ABNORMAL HIGH (ref 70–99)
Potassium: 4.1 mmol/L (ref 3.5–5.1)
Sodium: 131 mmol/L — ABNORMAL LOW (ref 135–145)

## 2020-09-10 IMAGING — CR DG CHEST 2V
2 series · 2 of 2 positions shown · non-contrast
Comparison: [DATE]

CLINICAL DATA: Cough

EXAM:
CHEST - 2 VIEW

[chest pa]
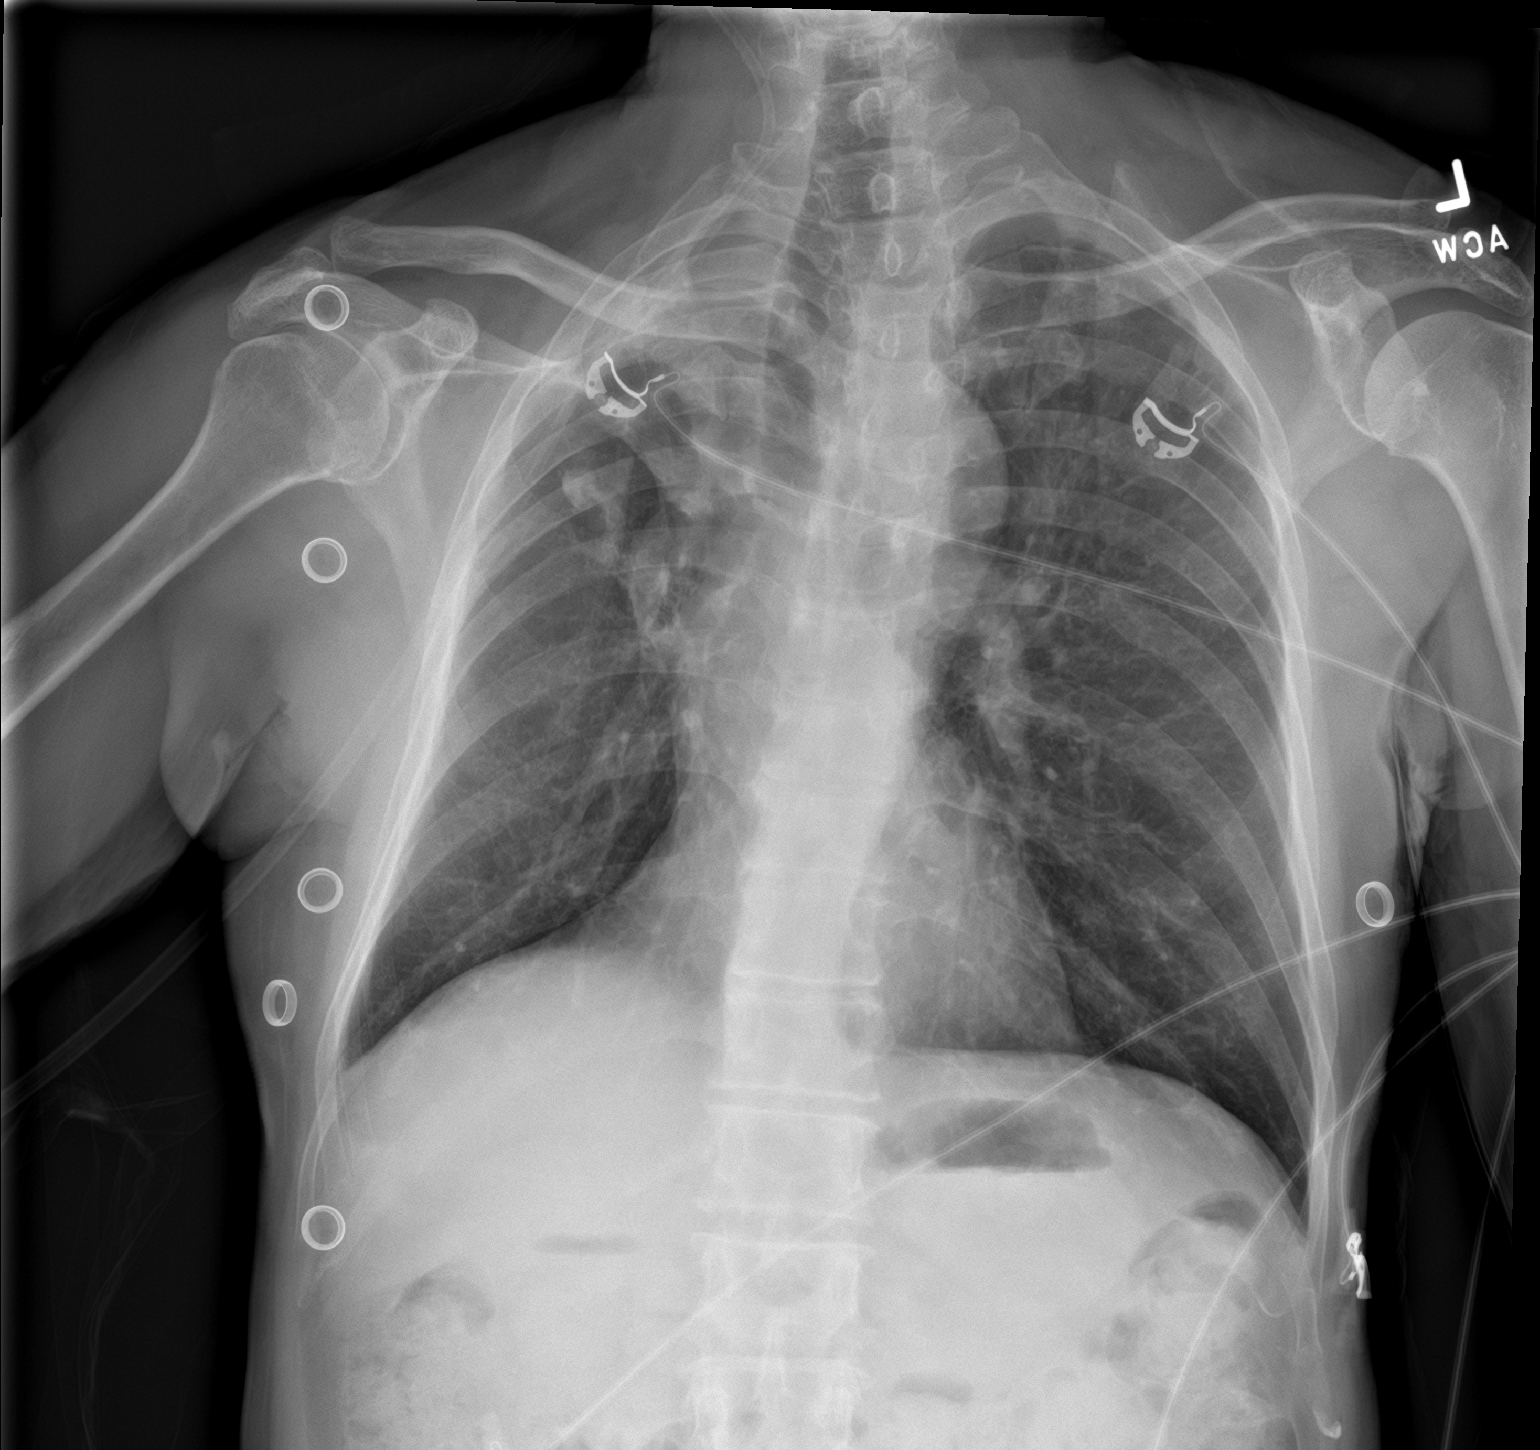

[chest lat]
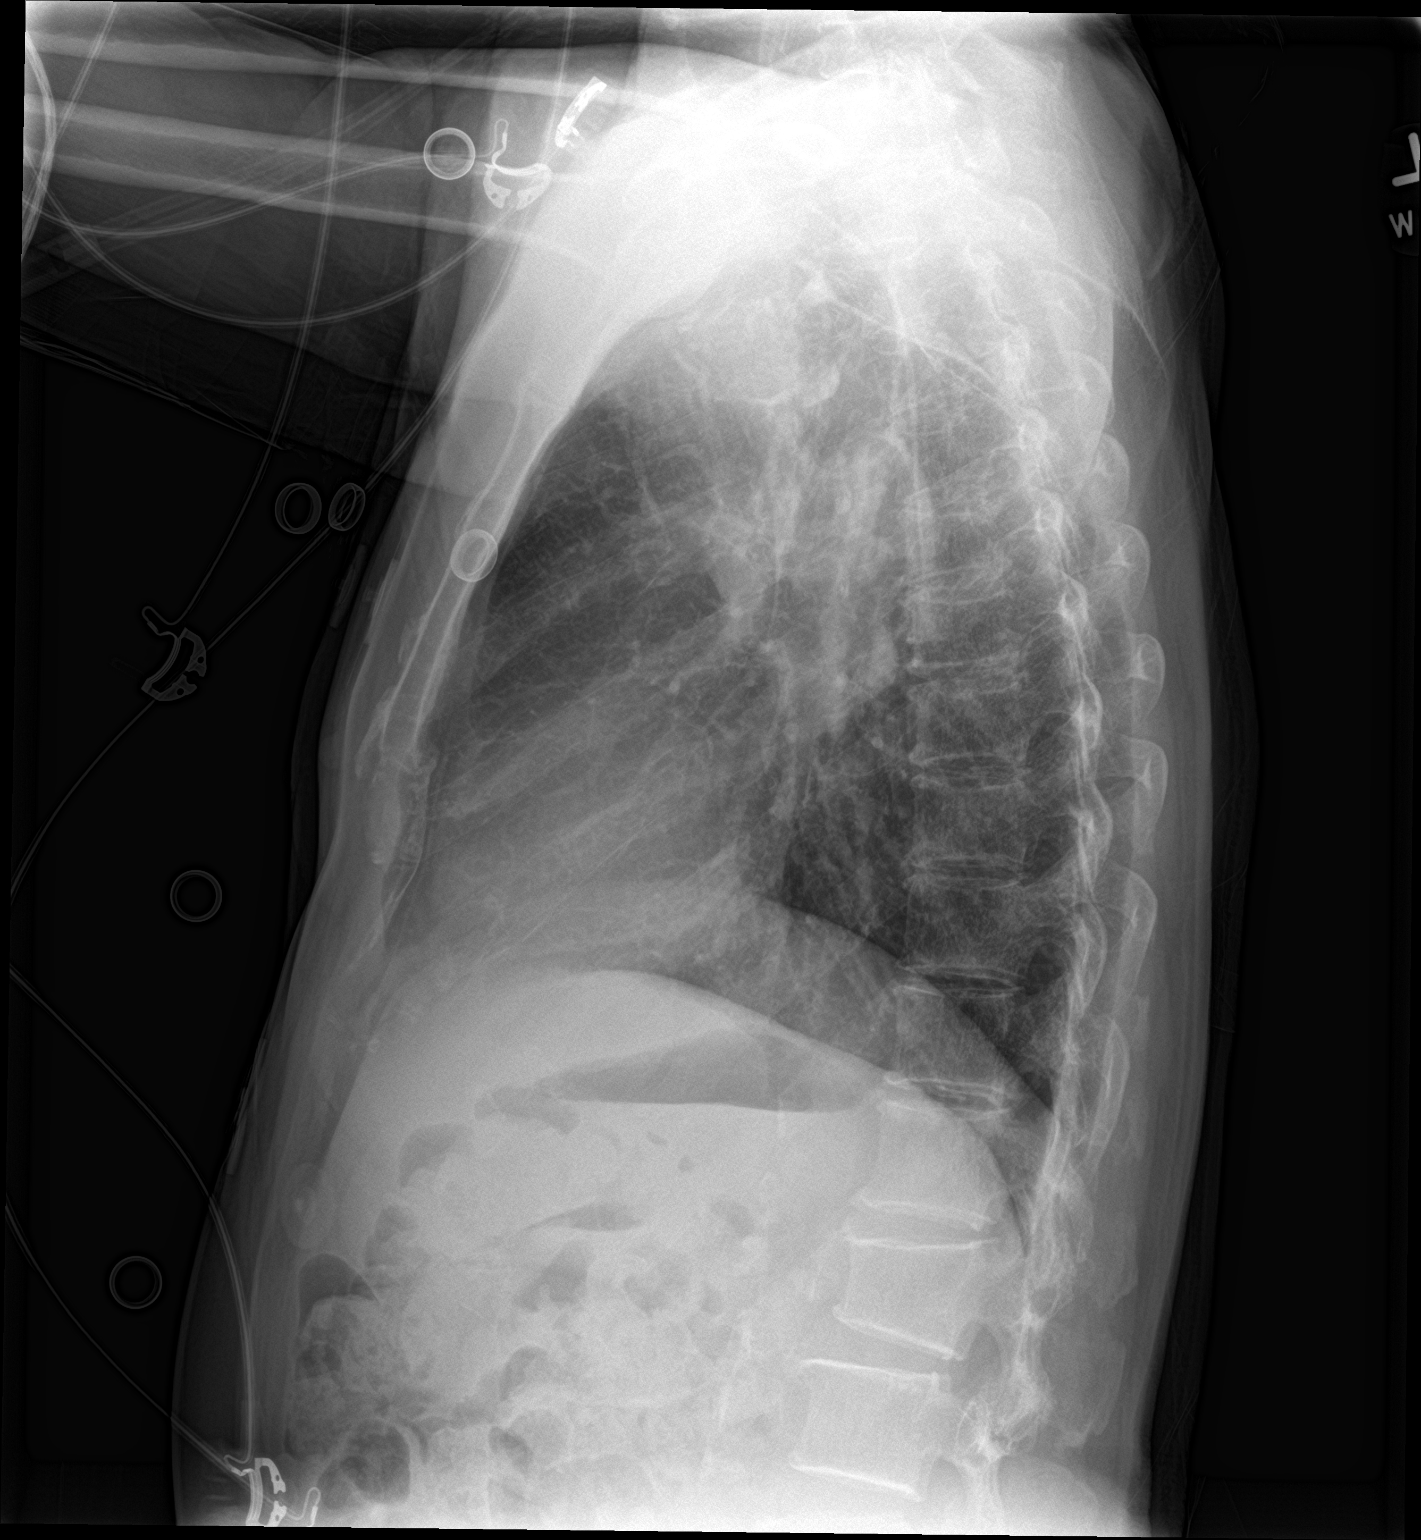

[2 of 2 positions shown; findings below may reference images not displayed]

FINDINGS: Chronic scarring on the right upper lobe unchanged. Calcified nodule
right upper lobe unchanged.

Heart size and vascularity normal. Lungs clear without infiltrate or
effusion. Chronic fracture at approximately L1 unchanged from prior
study.
IMPRESSION: Chronic scarring right upper lobe. No superimposed acute
abnormality.

## 2020-09-10 IMAGING — CT CT HEAD W/O CM
4 of 7 series · 16 of 47 positions shown, 18 images · non-contrast
Comparison: None

CLINICAL DATA: Neck trauma, dizziness, headache, neck pain,
shortness of breath, diabetes mellitus, hypertension

EXAM:
CT HEAD WITHOUT CONTRAST
CT CERVICAL SPINE WITHOUT CONTRAST
TECHNIQUE: Multidetector CT imaging of the head and cervical spine was
performed following the standard protocol without intravenous
contrast. Multiplanar CT image reconstructions of the cervical spine
were also generated.

[Series 2: head wo · axial · 0.40mm/px · z∈[+969,+1064]mm · 6 of 27 slices shown, 8 images]
[im 4/27  brain]
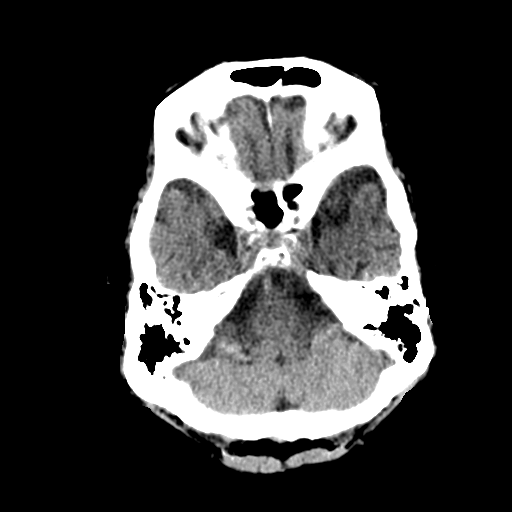
[im 4/27  bone]
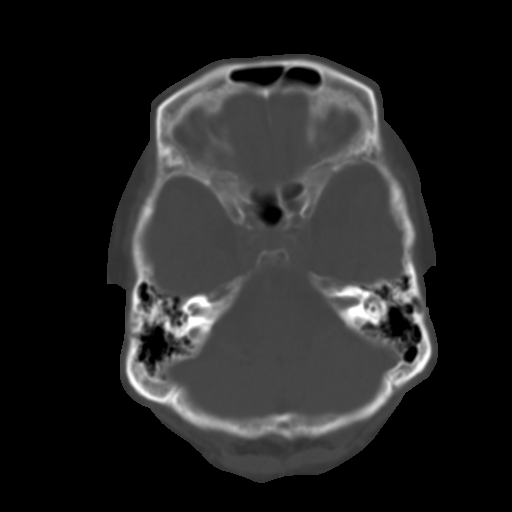
[im 8/27  brain]
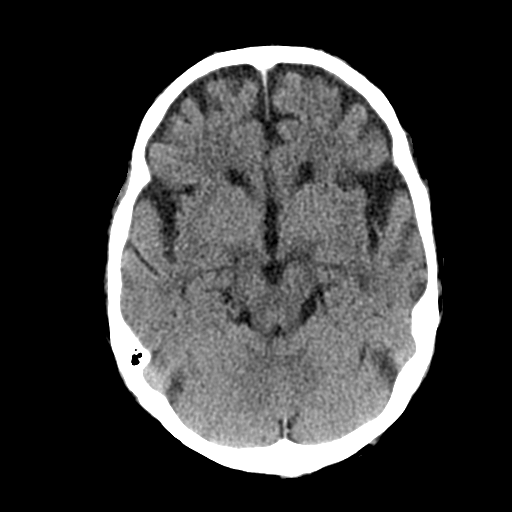
[im 12/27  brain]
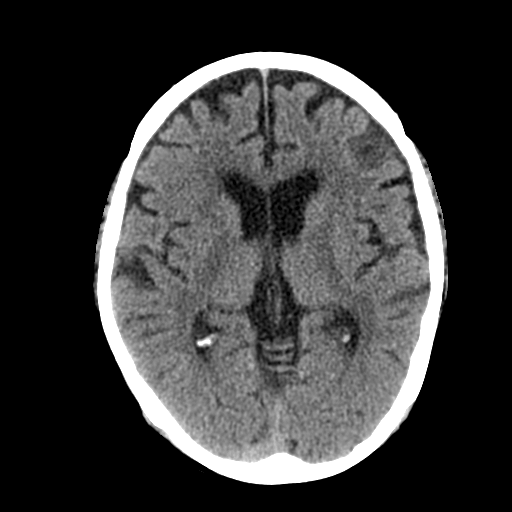
[im 15/27  brain]
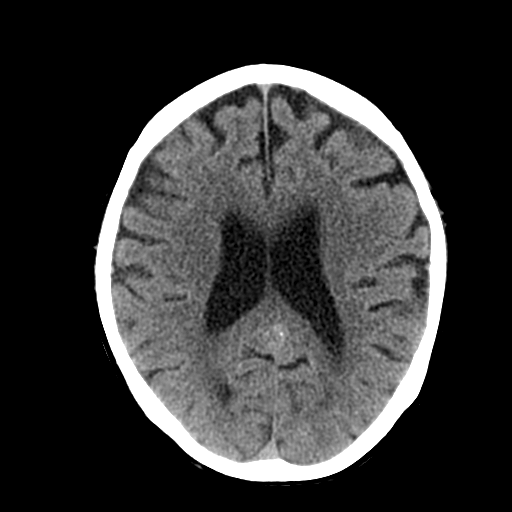
[im 19/27  brain]
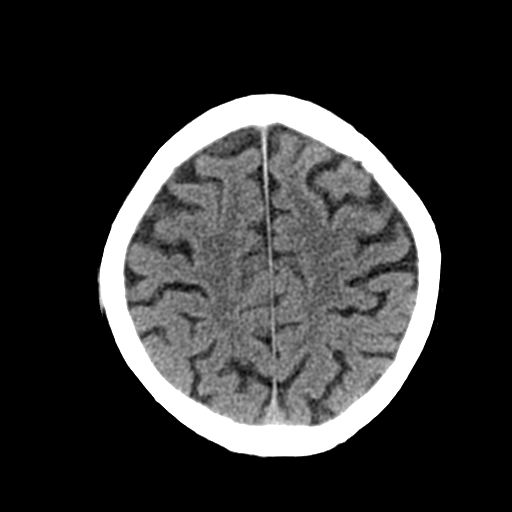
[im 19/27  bone]
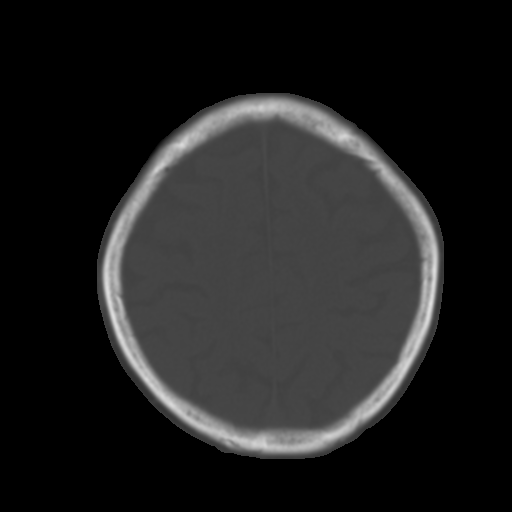
[im 23/27  brain]
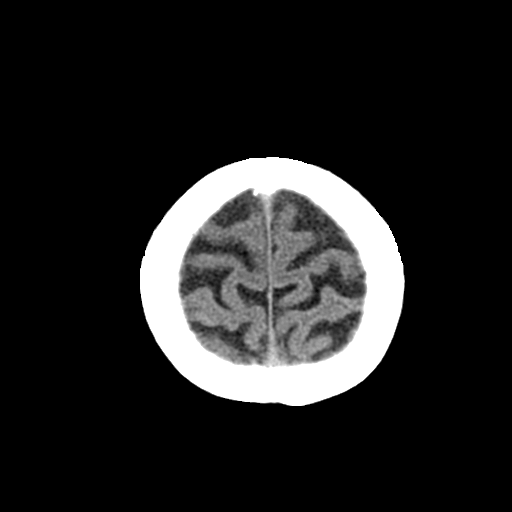

[Series 3: head bone · axial · 0.40mm/px · z∈[+968,+1030]mm · 5 of 68 slices shown]
[im 8/68  bone]
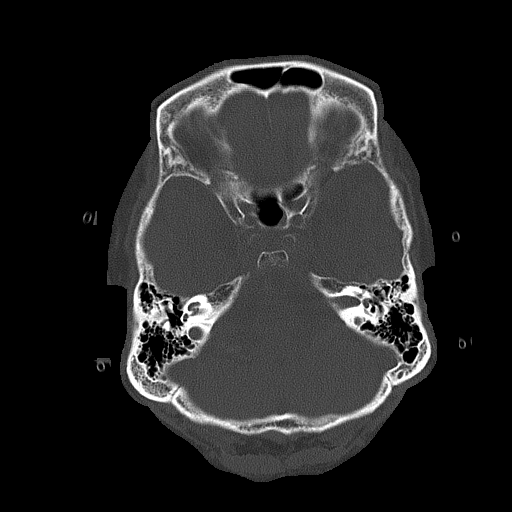
[im 15/68  bone]
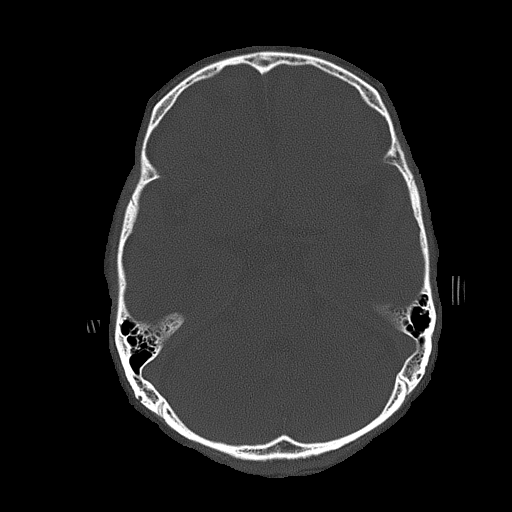
[im 22/68  bone]
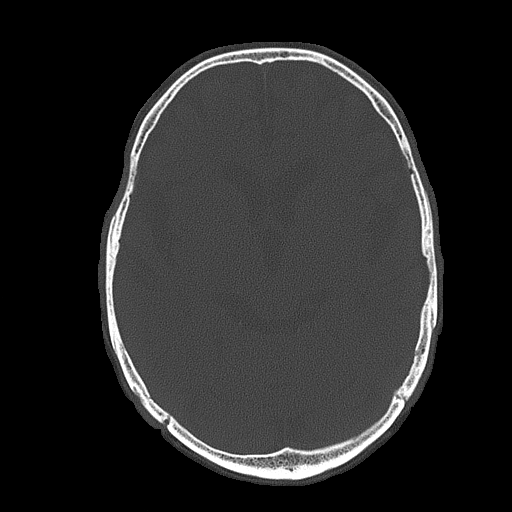
[im 29/68  bone]
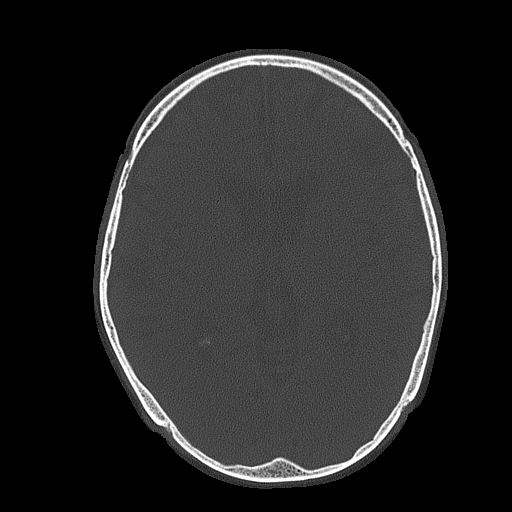
[im 39/68  bone]
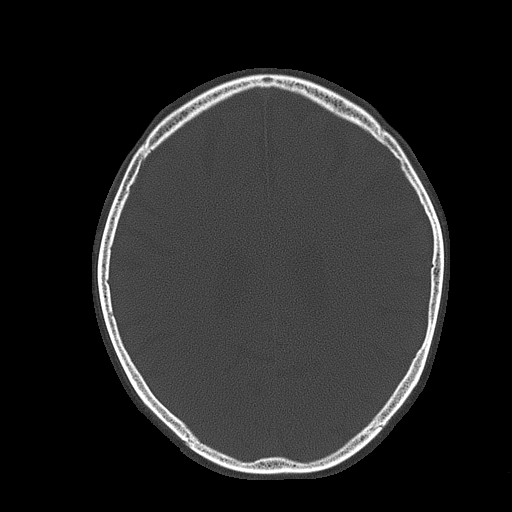

[Series 4: coronal soft tissue · coronal · 0.29mm/px · 3 of 61 slices shown]
[im 16/61  brain]
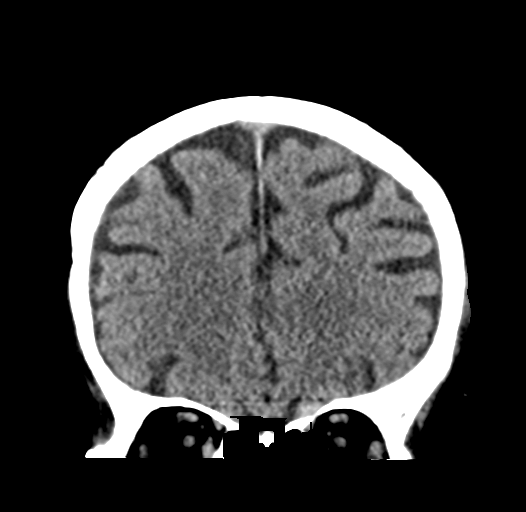
[im 31/61  brain]
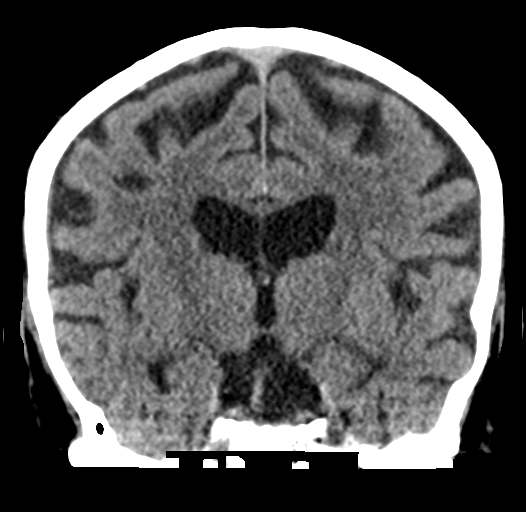
[im 46/61  brain]
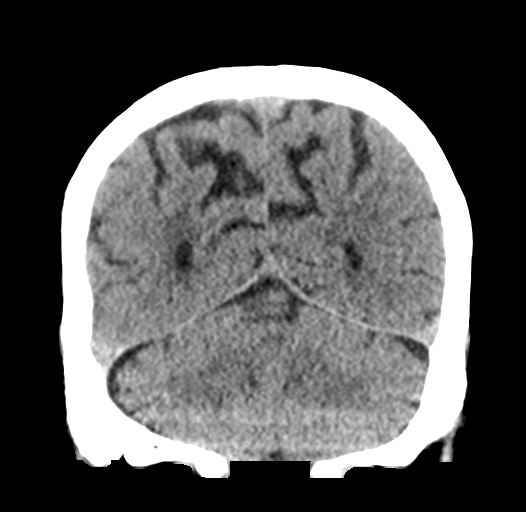

[Series 5: sagittal soft tissue · sagittal · 0.30mm/px · 2 of 52 slices shown]
[im 18/52  brain]
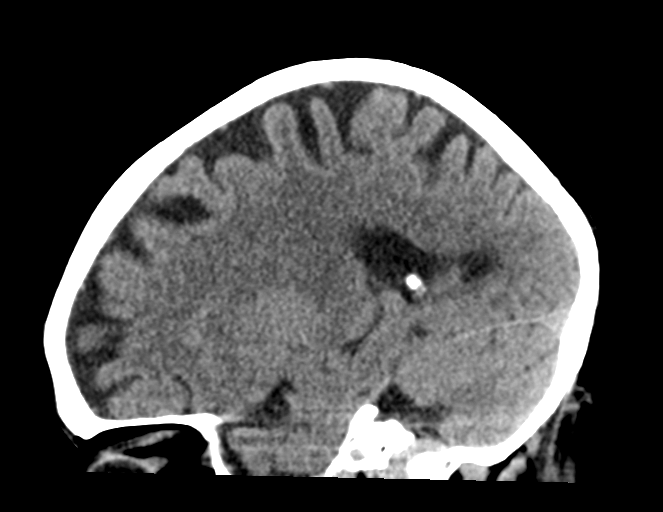
[im 35/52  brain]
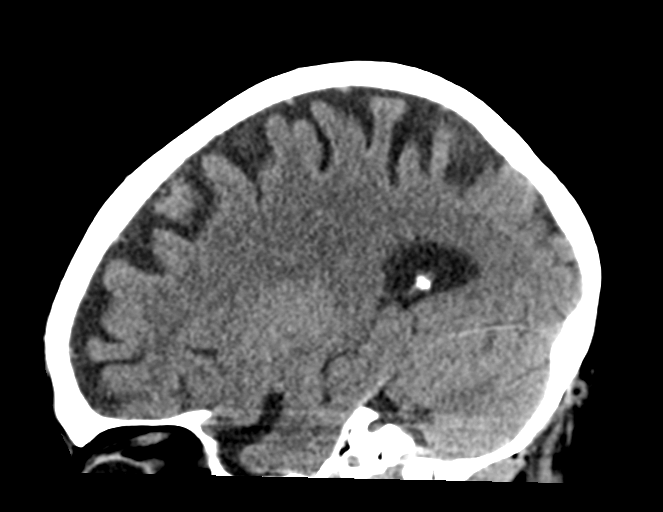

[16 of 47 positions shown; findings below may reference images not displayed]

FINDINGS: CT HEAD FINDINGS

Brain: Generalized atrophy. Normal ventricular morphology. No
midline shift or mass effect. Normal appearance of brain parenchyma.
No intracranial hemorrhage, mass lesion, or evidence of acute
infarction. No extra-axial fluid collections.

Vascular: No hyperdense vessels

Skull: Intact

Sinuses/Orbits: Mucosal thickening ethmoid air cells. Fluid within
sphenoid sinus.

Other: N/A

CT CERVICAL SPINE FINDINGS

Alignment: Normal

Skull base and vertebrae: Skull base intact. Vertebral body heights
maintained. Scattered disc space narrowing and endplate spur
formation. Minor facet degenerative changes greatest at LEFT C4-C5.
Uncovertebral spurs significantly encroach upon cervical neural
foramina on LEFT at C3-C4 and C5-C6. No fracture, subluxation, or
bone destruction.

Soft tissues and spinal canal: Prevertebral soft tissues normal
thickness. Atherosclerotic calcifications at carotid bifurcations
and proximal great vessels.

Disc levels:  No specific abnormalities

Upper chest: Scarring at lung apices greater on RIGHT., somewhat
nodular appearance, unchanged since [DATE].

Other: N/A
IMPRESSION: Generalized atrophy.

No acute intracranial abnormalities.

Sphenoid sinus disease.

Multilevel degenerative disc and facet disease changes of the
cervical spine.

No acute cervical spine abnormalities.

## 2020-09-10 IMAGING — CT CT CERVICAL SPINE W/O CM
3 of 4 series · 11 of 33 positions shown, 13 images · non-contrast
Comparison: None

CLINICAL DATA: Neck trauma, dizziness, headache, neck pain,
shortness of breath, diabetes mellitus, hypertension

EXAM:
CT HEAD WITHOUT CONTRAST
CT CERVICAL SPINE WITHOUT CONTRAST
TECHNIQUE: Multidetector CT imaging of the head and cervical spine was
performed following the standard protocol without intravenous
contrast. Multiplanar CT image reconstructions of the cervical spine
were also generated.

[Series 4: sagittal bone · sagittal · 0.24mm/px · 5 of 61 slices shown, 6 images]
[im 21/61  bone]
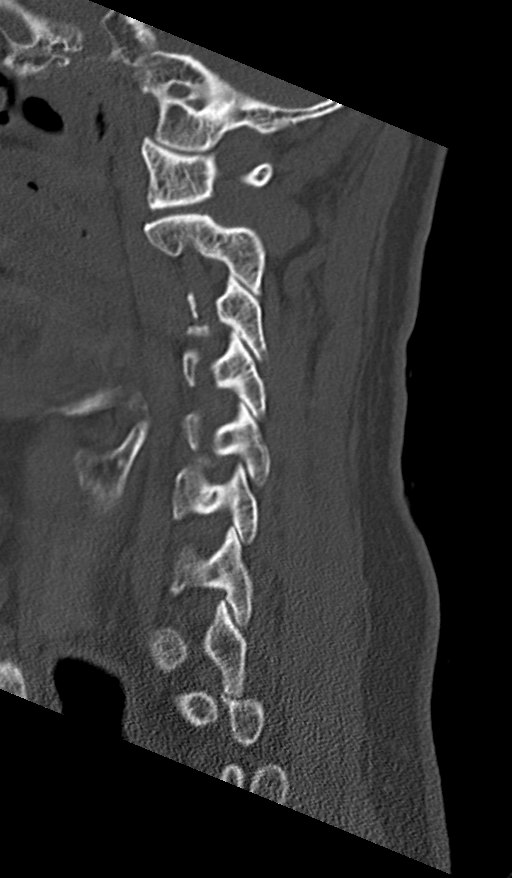
[im 26/61  bone]
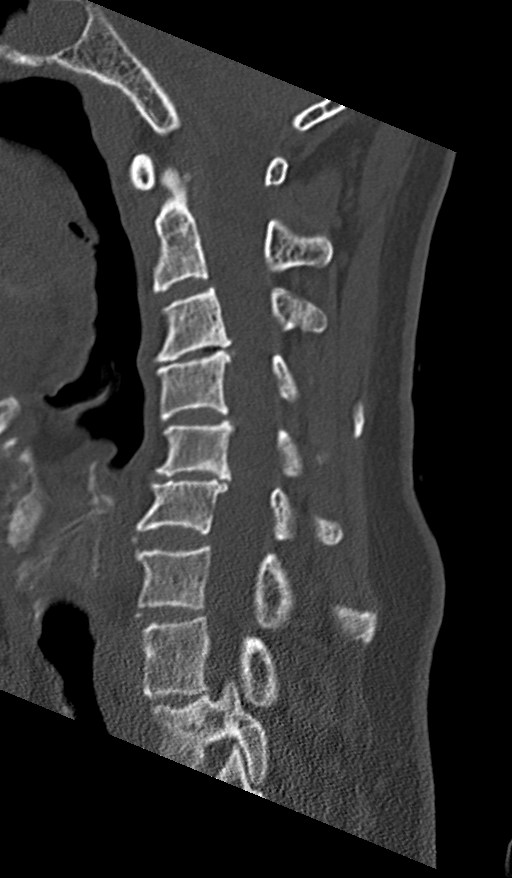
[im 31/61  soft-tissue]
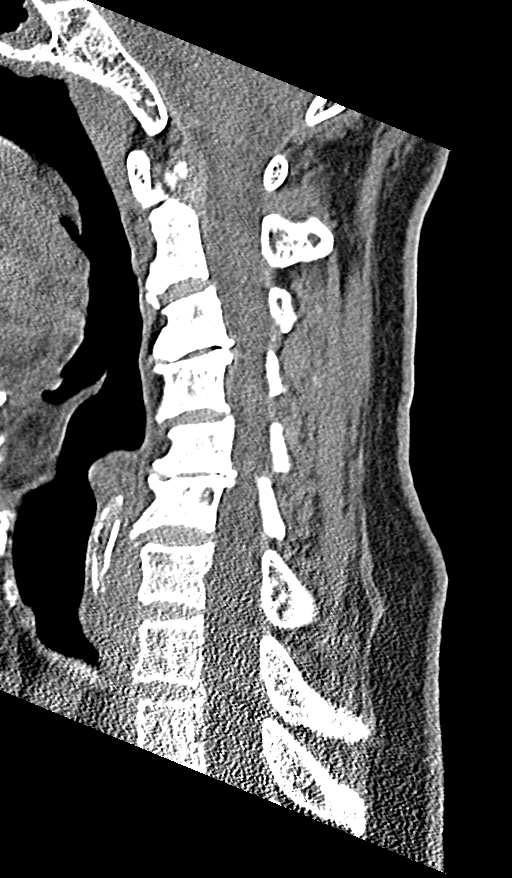
[im 31/61  bone]
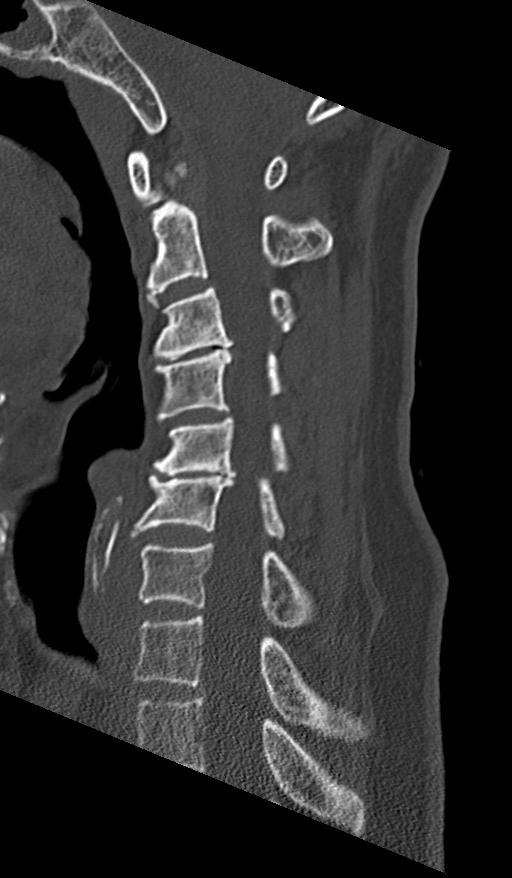
[im 36/61  bone]
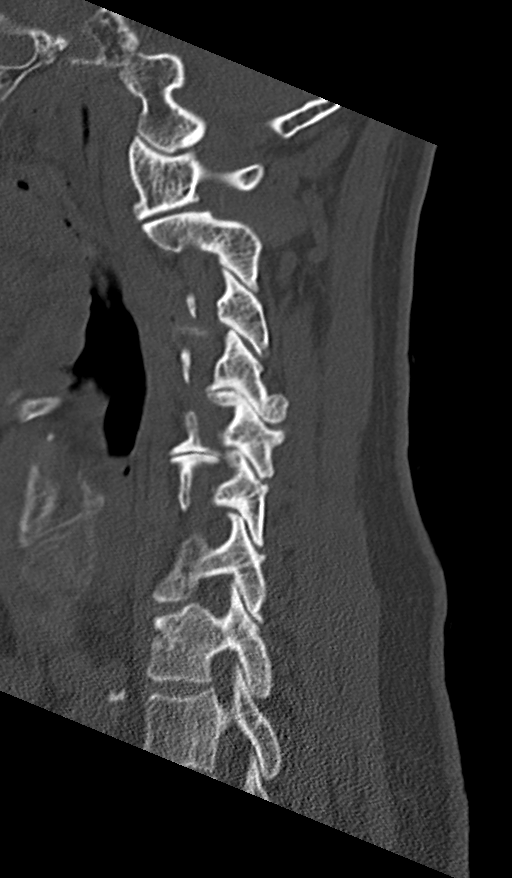
[im 41/61  bone]
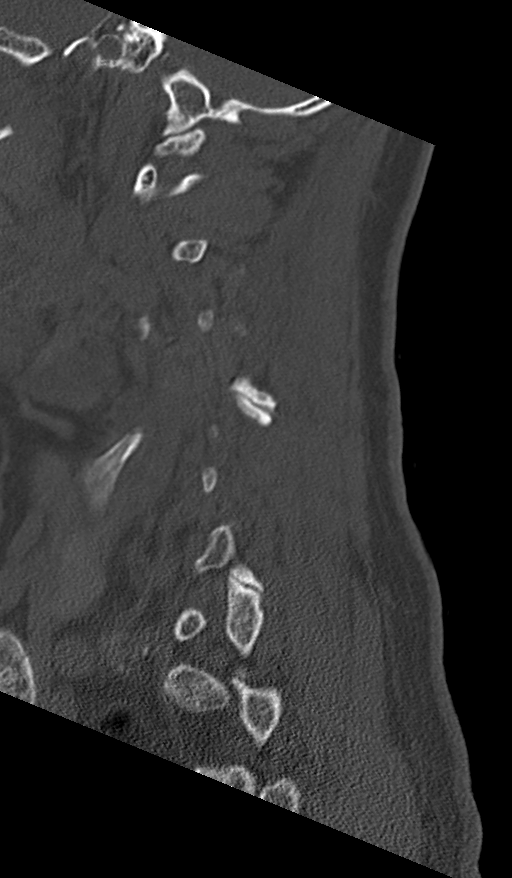

[Series 5: coronal bone · coronal · 0.23mm/px · 3 of 61 slices shown]
[im 13/61  bone]
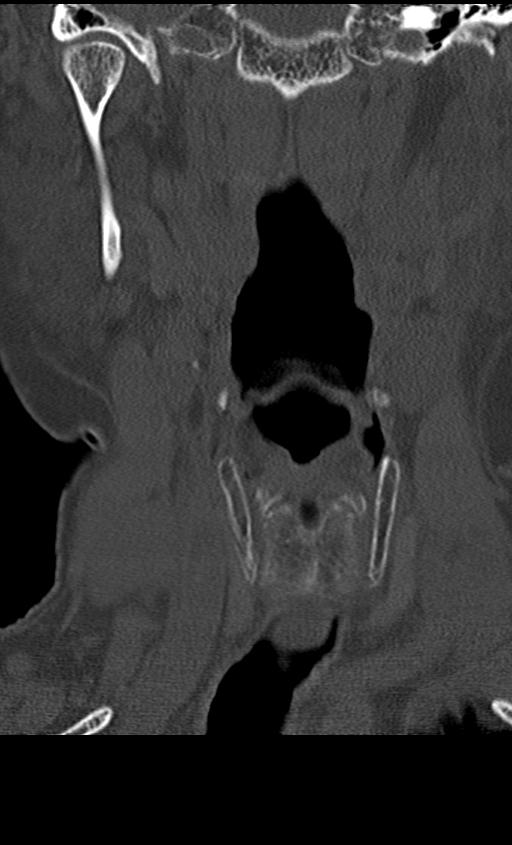
[im 25/61  bone]
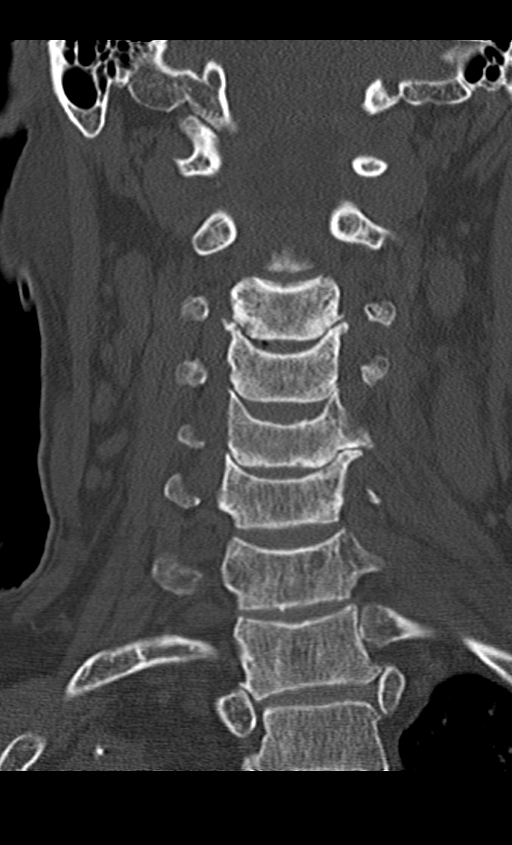
[im 37/61  bone]
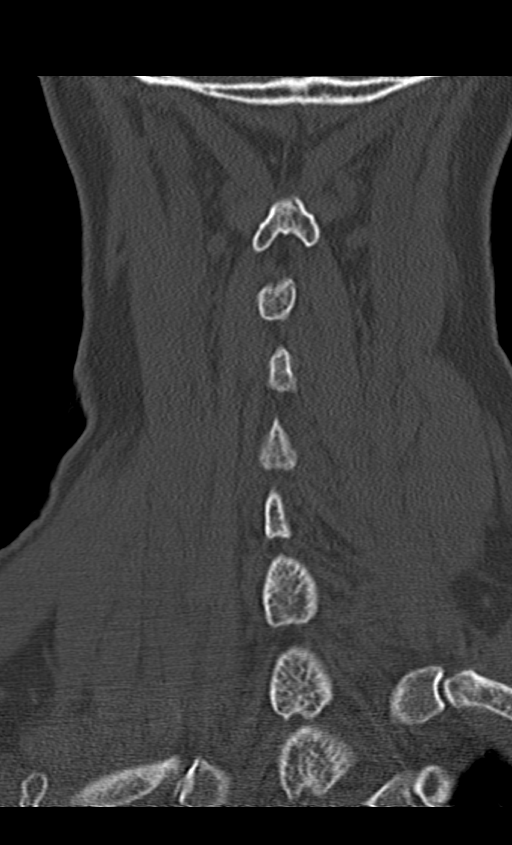

[Series 6: orthogonal bone · axial · 0.23mm/px · z∈[+826,+938]mm · 3 of 104 slices shown, 4 images]
[im 30/104  soft-tissue]
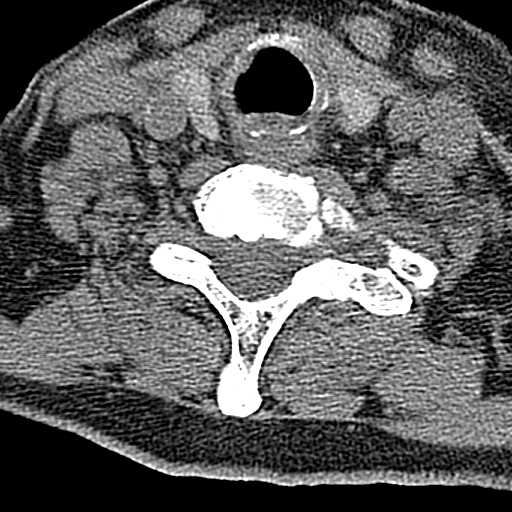
[im 30/104  bone]
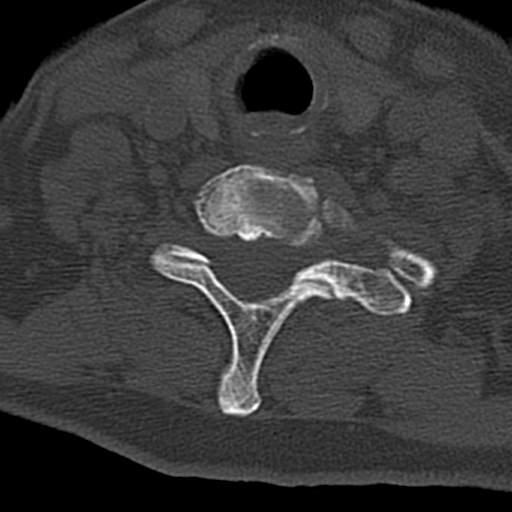
[im 59/104  bone]
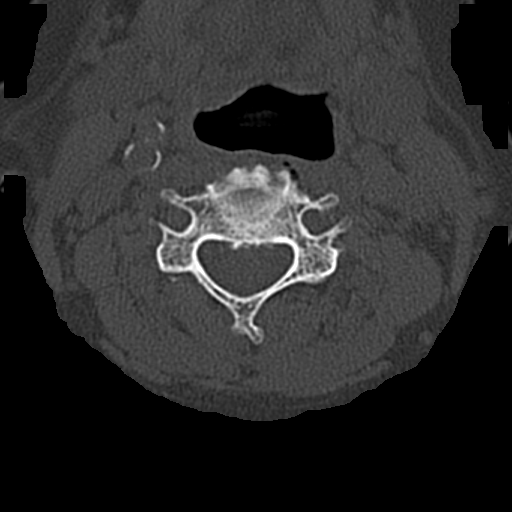
[im 89/104  bone]
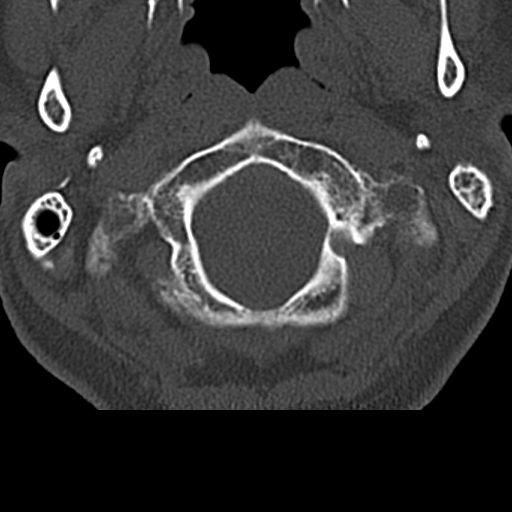

[11 of 33 positions shown; findings below may reference images not displayed]

FINDINGS: CT HEAD FINDINGS

Brain: Generalized atrophy. Normal ventricular morphology. No
midline shift or mass effect. Normal appearance of brain parenchyma.
No intracranial hemorrhage, mass lesion, or evidence of acute
infarction. No extra-axial fluid collections.

Vascular: No hyperdense vessels

Skull: Intact

Sinuses/Orbits: Mucosal thickening ethmoid air cells. Fluid within
sphenoid sinus.

Other: N/A

CT CERVICAL SPINE FINDINGS

Alignment: Normal

Skull base and vertebrae: Skull base intact. Vertebral body heights
maintained. Scattered disc space narrowing and endplate spur
formation. Minor facet degenerative changes greatest at LEFT C4-C5.
Uncovertebral spurs significantly encroach upon cervical neural
foramina on LEFT at C3-C4 and C5-C6. No fracture, subluxation, or
bone destruction.

Soft tissues and spinal canal: Prevertebral soft tissues normal
thickness. Atherosclerotic calcifications at carotid bifurcations
and proximal great vessels.

Disc levels:  No specific abnormalities

Upper chest: Scarring at lung apices greater on RIGHT., somewhat
nodular appearance, unchanged since [DATE].

Other: N/A
IMPRESSION: Generalized atrophy.

No acute intracranial abnormalities.

Sphenoid sinus disease.

Multilevel degenerative disc and facet disease changes of the
cervical spine.

No acute cervical spine abnormalities.

## 2020-09-10 MED ORDER — BENZONATATE 100 MG PO CAPS: ORAL_CAPSULE | ORAL | 0 refills | Status: DC

## 2020-09-10 MED ORDER — PREDNISONE 20 MG PO TABS: 20.0000 mg | ORAL_TABLET | Freq: Two times a day (BID) | ORAL | 0 refills | Status: AC

## 2020-09-10 MED ORDER — BENZONATATE 100 MG PO CAPS
200.0000 mg | ORAL_CAPSULE | Freq: Once | ORAL | Status: AC
  Administered 2020-09-10: 200 mg via ORAL
  Filled 2020-09-10: qty 2

## 2020-09-10 MED ORDER — PREDNISONE 20 MG PO TABS
60.0000 mg | ORAL_TABLET | Freq: Once | ORAL | Status: AC
  Administered 2020-09-10: 60 mg via ORAL
  Filled 2020-09-10: qty 3

## 2020-09-10 NOTE — ED Triage Notes (Addendum)
Pt to ER via POV with complaints of ongoing dizziness. Reports being discharged on Friday with no improvement in symptoms since admission. Describes symptoms as the world is spinning. Reports taking meclizine. Unable to tolerate walking due to dizziness. Pt also reports headache and neck pain/ shortness of breath.  Interpreter used for triage and assessment.

## 2020-09-10 NOTE — Discharge Instructions (Addendum)
Cc phng th nghi?m, ch?p X-quang v CT c?a b?n v?n bnh th??ng t?i th?i ?i?m ny. Cc tri?u ch?ng chng m?t c?a b?n c th? l do tc d?ng an th?n c?a thu?c ho theo toa v thu?c chng m?t theo toa c?a b?n, khi dng cng nhau. B?n nn ng?ng c? hai lo?i thu?c vo lc ny. B?n c th? dng cc lo?i gel tr? ho m?i theo toa, c?ng nh? thu?c ho khng k ??n n?u c?n. B?n nn dng steroid theo ch? d?n. Theo di v?i nh cung c?p chnh c?a b?n ?? bi?t cc tri?u ch?ng ?ang di?n ra. [facility] n?u c?n.  Your labs, chest x-ray, and CT scans are normal at this time.  Your symptoms of dizziness are likely due to the sedating effects of your prescription cough medicine and your prescription vertigo medicine, when taken together.  You should discontinue both medicines at this time.  You may take the new prescription cough gels as prescribed, as well as an over-the-counter cough medicine if needed.  You should take the steroid as directed.  Follow-up with your primary provider for ongoing symptoms.  Return to the ED if needed.

## 2020-09-10 NOTE — ED Provider Notes (Signed)
[facility] Emergency Department Provider Note  ____________________________________________   Event Date/Time   First MD Initiated Contact with Patient 09/10/20 1248     (approximate)  I have reviewed the triage vital signs and the nursing notes.   HISTORY  Chief Complaint Dizziness  History limited by [place] language.  Tele-interpreter is present for interview and exam.  Patient's adult daughter also provides some interim history.  HPI Jonathan Willis is a 75 y.o. male with below medical history, including type 2 diabetes, COPD, chronic hypoxic respiratory failure on home O2 at night, presents to the ED for evaluation of several days of dizziness and fatigue.  Patient was apparently in the hospital for 4-day admission last week, for an acute exacerbation of his COPD.  He was treated at that time with IV steroids and bronchodilators, subsequently discharged home with a Hycodan syrup, bronchodilators, and meclizine for vertigo.  According to the daughter the patient has been taking this Hycodan syrup every 12 hours as directed, as well as meclizine 3 times daily.  The daughter notes that the patient is drowsy and unstable his feet for several hours after these medications.  He does endorse an ongoing mild intermittent cough, and shortness of breath.  He denies any interim fever, nausea, vomiting, abdominal pain, or syncope.    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   History of TB (tuberculosis)    Hypertension    Lower GI bleed     Patient Active Problem List   Diagnosis Date Noted   Sinus pressure 01/13/2020   COPD with acute exacerbation ([place]) 07/10/2019   Vertigo 05/15/2018   Urinary frequency 02/26/2018   Nocturia 02/26/2018   Occipital neuralgia of right side  09/05/2017   Headache 07/01/2017   Carotid stenosis 07/01/2017   Benign prostatic hyperplasia with incomplete bladder emptying 03/23/2017   Chronic respiratory failure ([place]) 01/21/2017   Chronic right shoulder pain 01/16/2017   Essential hypertension 01/16/2017   Type 2 diabetes mellitus with other specified complication ([place]) 86/76/1952   Dyslipidemia associated with type 2 diabetes mellitus ([place]) 01/16/2017   Centrilobular emphysema ([place]) 01/16/2017    Past Surgical History:  Procedure Laterality Date   COLONOSCOPY WITH PROPOFOL N/A 04/10/2017   Procedure: COLONOSCOPY WITH PROPOFOL;  Surgeon: [name], MD;  Location: [facility] ENDOSCOPY;  Service: Gastroenterology;  Laterality: N/A;   NO PAST SURGERIES      Prior to Admission medications   Medication Sig Start Date End Date Taking? Authorizing Provider  benzonatate (TESSALON PERLES) 100 MG capsule Take 1-2 tabs TID prn cough 09/10/20  Yes Rosine Solecki, Dannielle Karvonen, PA-C  predniSONE (DELTASONE) 20 MG tablet Take 1 tablet (20 mg total) by mouth 2 (two) times daily with a meal for 5 days. 09/10/20 09/15/20 Yes Williard Keller, Dannielle Karvonen, PA-C  ACCU-CHEK AVIVA PLUS test strip USE 1 TEST STRIP TO TEST BLOOD SUGAR DAILY 10/15/19   Malfi, Lupita Raider, FNP  Accu-Chek Softclix Lancets lancets USE TO TEST ONCE DAILY 10/16/18   Mikey College, NP  albuterol (VENTOLIN HFA) 108 (90 Base) MCG/ACT inhaler Inhale 2 puffs into the lungs every 6 (six) hours as needed for wheezing or shortness of breath. 07/10/19   Malfi, Lupita Raider, FNP  chlorpheniramine-HYDROcodone (TUSSIONEX) 10-8 MG/5ML SUER Take 5 mLs by mouth every 12 (  twelve) hours. 08/31/20   [provider]  Fluticasone-Umeclidin-Vilant (TRELEGY ELLIPTA) 100-62.5-25 MCG/INH AEPB Inhale 1 puff into the lungs daily. 04/16/20   Malfi, Lupita Raider, FNP  ipratropium-albuterol (DUONEB) 0.5-2.5 (3) MG/3ML SOLN Inhale 3 mLs into the lungs every 6 (six) hours as needed. 09/04/20 10/04/20  Jennye Boroughs, MD   meclizine (ANTIVERT) 12.5 MG tablet Take 1 tablet (12.5 mg total) by mouth 3 (three) times daily as needed for dizziness. 08/21/20   Karamalegos, Devonne Doughty, DO  oxybutynin (DITROPAN-XL) 5 MG 24 hr tablet TAKE 1 TABLET BY MOUTH EVERY DAY 04/06/20   Malfi, Lupita Raider, FNP  Spacer/Aero-Hold Chamber Bags MISC 1 Device by Does not apply route every 6 (six) hours as needed (with inhaler). 01/10/17   Mikey College, NP  tamsulosin (FLOMAX) 0.4 MG CAPS capsule TAKE 2 CAPSULES BY MOUTH EVERY DAY 04/06/20   Malfi, Lupita Raider, FNP  telmisartan (MICARDIS) 40 MG tablet Take 1 tablet (40 mg total) by mouth daily. 09/01/20   Jearld Fenton, NP    Allergies Patient has no known allergies.  Family History  Problem Relation Age of Onset   COPD Neg Hx    Diabetes Mellitus II Neg Hx    Hypertension Neg Hx     Social History Social History   Tobacco Use   Smoking status: Former    Packs/day: 1.50    Years: 44.00    Pack years: 66.00    Types: Cigarettes    Quit date: 01/11/2007    Years since quitting: 13.6   Smokeless tobacco: Former  Scientific laboratory technician Use: Never used  Substance Use Topics   Alcohol use: No   Drug use: No    Review of Systems  Constitutional: No fever/chills Eyes: No visual changes. ENT: No sore throat. Cardiovascular: Denies chest pain. Respiratory: Reports shortness of breat and intermittent cough Gastrointestinal: No abdominal pain.  No nausea, no vomiting.  No diarrhea.  No constipation. Genitourinary: Negative for dysuria. Musculoskeletal: Negative for back pain. Skin: Negative for rash. Neurological: Negative for headaches, focal weakness or numbness. Reports dizziness ____________________________________________   PHYSICAL EXAM:  VITAL SIGNS: ED Triage Vitals  Enc Vitals Group     BP 09/10/20 1242 (!) 113/98     Pulse Rate 09/10/20 1242 99     Resp 09/10/20 1242 (!) 30     Temp --      Temp src --      SpO2 09/10/20 1242 99 %     Weight 09/10/20  1237 108 lb (49 kg)     Height 09/10/20 1237 5\' 1"  (1.549 m)     Head Circumference --      Peak Flow --      Pain Score 09/10/20 1237 4     Pain Loc --      Pain Edu? --      Excl. in [place]? --     Constitutional: Alert and oriented. Well appearing and in no acute distress. GCS = 15 Eyes: Conjunctivae are normal. EOMI. Head: Atraumatic. Nose: No congestion/rhinnorhea. Mouth/Throat: Mucous membranes are moist.  Oropharynx non-erythematous. Neck: No stridor.   Cardiovascular: Normal rate, regular rhythm. Grossly normal heart sounds.  Good peripheral circulation. Respiratory: Normal respiratory effort.  No retractions. Lungs CTAB. Gastrointestinal: Soft and nontender. No distention. No abdominal bruits. No CVA tenderness. Musculoskeletal: No lower extremity tenderness nor edema.  No joint effusions. Neurologic:  CN II-XII grossly intact. Normal speech and language. No gross focal neurologic deficits are  appreciated. No gait instability. Skin:  Skin is warm, dry and intact. No rash noted. Psychiatric: Mood and affect are normal. Speech and behavior are normal.  ____________________________________________   LABS (all labs ordered are listed, but only abnormal results are displayed)  Labs Reviewed  BASIC METABOLIC PANEL - Abnormal; Notable for the following components:      Result Value   Sodium 131 (*)    Glucose, Bld 176 (*)    Calcium 8.7 (*)    Anion gap 3 (*)    All other components within normal limits  CBC  URINALYSIS, COMPLETE (UACMP) WITH MICROSCOPIC  CBG MONITORING, ED   ____________________________________________  EKG  Vent. rate 110 BPM PR interval 115 ms QRS duration 71 ms QT/QTcB 310/406 ms P-R-T axes 74 69 78 ____________________________________________  RADIOLOGY  I, Melvenia Needles, personally viewed and evaluated these images (plain radiographs) as part of my medical decision making, as well as reviewing the written report by the  radiologist.  ED MD interpretation:  agree with report  Official radiology report(s): DG Chest 2 View  Result Date: 09/10/2020 CLINICAL DATA:  Cough EXAM: CHEST - 2 VIEW COMPARISON:  09/02/2020 FINDINGS: Chronic scarring on the right upper lobe unchanged. Calcified nodule right upper lobe unchanged. Heart size and vascularity normal. Lungs clear without infiltrate or effusion. Chronic fracture at approximately L1 unchanged from prior study. IMPRESSION: Chronic scarring right upper lobe. No superimposed acute abnormality. Electronically Signed   By: Franchot Gallo M.D.   On: 09/10/2020 13:48   CT Head Wo Contrast  Result Date: 09/10/2020 CLINICAL DATA:  Neck trauma, dizziness, headache, neck pain, shortness of breath, diabetes mellitus, hypertension EXAM: CT HEAD WITHOUT CONTRAST CT CERVICAL SPINE WITHOUT CONTRAST TECHNIQUE: Multidetector CT imaging of the head and cervical spine was performed following the standard protocol without intravenous contrast. Multiplanar CT image reconstructions of the cervical spine were also generated. COMPARISON:  None FINDINGS: CT HEAD FINDINGS Brain: Generalized atrophy. Normal ventricular morphology. No midline shift or mass effect. Normal appearance of brain parenchyma. No intracranial hemorrhage, mass lesion, or evidence of acute infarction. No extra-axial fluid collections. Vascular: No hyperdense vessels Skull: Intact Sinuses/Orbits: Mucosal thickening ethmoid air cells. Fluid within sphenoid sinus. Other: N/A CT CERVICAL SPINE FINDINGS Alignment: Normal Skull base and vertebrae: Skull base intact. Vertebral body heights maintained. Scattered disc space narrowing and endplate spur formation. Minor facet degenerative changes greatest at LEFT C4-C5. Uncovertebral spurs significantly encroach upon cervical neural foramina on LEFT at C3-C4 and C5-C6. No fracture, subluxation, or bone destruction. Soft tissues and spinal canal: Prevertebral soft tissues normal thickness.  Atherosclerotic calcifications at carotid bifurcations and proximal great vessels. Disc levels:  No specific abnormalities Upper chest: Scarring at lung apices greater on RIGHT., somewhat nodular appearance, unchanged since 06/03/2017. Other: N/A IMPRESSION: Generalized atrophy. No acute intracranial abnormalities. Sphenoid sinus disease. Multilevel degenerative disc and facet disease changes of the cervical spine. No acute cervical spine abnormalities. Electronically Signed   By: Lavonia Dana M.D.   On: 09/10/2020 14:08   CT Cervical Spine Wo Contrast  Result Date: 09/10/2020 CLINICAL DATA:  Neck trauma, dizziness, headache, neck pain, shortness of breath, diabetes mellitus, hypertension EXAM: CT HEAD WITHOUT CONTRAST CT CERVICAL SPINE WITHOUT CONTRAST TECHNIQUE: Multidetector CT imaging of the head and cervical spine was performed following the standard protocol without intravenous contrast. Multiplanar CT image reconstructions of the cervical spine were also generated. COMPARISON:  None FINDINGS: CT HEAD FINDINGS Brain: Generalized atrophy. Normal ventricular morphology. No midline shift or  mass effect. Normal appearance of brain parenchyma. No intracranial hemorrhage, mass lesion, or evidence of acute infarction. No extra-axial fluid collections. Vascular: No hyperdense vessels Skull: Intact Sinuses/Orbits: Mucosal thickening ethmoid air cells. Fluid within sphenoid sinus. Other: N/A CT CERVICAL SPINE FINDINGS Alignment: Normal Skull base and vertebrae: Skull base intact. Vertebral body heights maintained. Scattered disc space narrowing and endplate spur formation. Minor facet degenerative changes greatest at LEFT C4-C5. Uncovertebral spurs significantly encroach upon cervical neural foramina on LEFT at C3-C4 and C5-C6. No fracture, subluxation, or bone destruction. Soft tissues and spinal canal: Prevertebral soft tissues normal thickness. Atherosclerotic calcifications at carotid bifurcations and proximal  great vessels. Disc levels:  No specific abnormalities Upper chest: Scarring at lung apices greater on RIGHT., somewhat nodular appearance, unchanged since 06/03/2017. Other: N/A IMPRESSION: Generalized atrophy. No acute intracranial abnormalities. Sphenoid sinus disease. Multilevel degenerative disc and facet disease changes of the cervical spine. No acute cervical spine abnormalities. Electronically Signed   By: Lavonia Dana M.D.   On: 09/10/2020 14:08    ____________________________________________   PROCEDURES  Procedure(s) performed (including Critical Care):  Procedures   ____________________________________________   INITIAL IMPRESSION / ASSESSMENT AND PLAN / ED COURSE  As part of my medical decision making, I reviewed the following data within the [place] History obtained from family, Labs reviewed WNL, Radiograph reviewed agree with reports, and Notes from prior ED visits  Differential diagnosis includes, but is not limited to, alcohol, illicit or prescription medications, or other toxic ingestion; intracranial pathology such as stroke or intracerebral hemorrhage; fever or infectious causes including sepsis; hypoxemia and/or hypercarbia; uremia; trauma; endocrine related disorders such as diabetes, hypoglycemia, and thyroid-related diseases; hypertensive encephalopathy; etc.   Patient ED evaluation of some ongoing dizziness.  He has been otherwise stable without any reports of fall or syncope.  Concern at this time is that the patient may be experiencing side effects of his sedating cough medicine along with the side effect of his meclizine that he has been taking on schedule.  We will evaluate with labs and CT imaging at this time, and reassess the patient.  If there are no acute findings, then the patient will be advised to discontinue his Hycodan cough syrup as well as meclizine at this time.  We will replace his medicines with nondrowsy cough medicine like  Tessalon Perle and consider a steroid course for his cough.  ____________________________________________   FINAL CLINICAL IMPRESSION(S) / ED DIAGNOSES  Final diagnoses:  Dizziness     ED Discharge Orders          Ordered    benzonatate (TESSALON PERLES) 100 MG capsule        09/10/20 1447    predniSONE (DELTASONE) 20 MG tablet  2 times daily with meals        09/10/20 1447             Note:  This document was prepared using Dragon voice recognition software and may include unintentional dictation errors.    Melvenia Needles, PA-C 09/10/20 2012    Duffy Bruce, MD 09/13/20 [phone]

## 2020-09-10 NOTE — ED Notes (Signed)
[place] video interpreter used for triage, assessment, and to explain plan of care including initiating iv, drawing labs, obtaining ekg. Pt's family present at bedside and they both verbalize understanding.  Interpreter info - name: Cristie Hem, [place] # [phone]

## 2020-09-10 NOTE — ED Notes (Signed)
[place] video interpreter used to discuss medications and review discharge instructions. Pt and family verbalize understanding.  Name: Jonathan Willis, [place] # [id number]

## 2020-09-22 ENCOUNTER — Encounter: Payer: Self-pay | Admitting: Internal Medicine

## 2020-09-22 ENCOUNTER — Ambulatory Visit (INDEPENDENT_AMBULATORY_CARE_PROVIDER_SITE_OTHER): Payer: Medicare Other | Admitting: Internal Medicine

## 2020-09-22 ENCOUNTER — Other Ambulatory Visit: Payer: Self-pay

## 2020-09-22 VITALS — BP 142/56 | HR 78 | Temp 97.5°F | Resp 20 | Ht 61.0 in | Wt 101.6 lb

## 2020-09-22 DIAGNOSIS — I700 Atherosclerosis of aorta: Secondary | ICD-10-CM

## 2020-09-22 DIAGNOSIS — E1169 Type 2 diabetes mellitus with other specified complication: Secondary | ICD-10-CM | POA: Diagnosis not present

## 2020-09-22 DIAGNOSIS — J441 Chronic obstructive pulmonary disease with (acute) exacerbation: Secondary | ICD-10-CM | POA: Diagnosis not present

## 2020-09-22 DIAGNOSIS — R350 Frequency of micturition: Secondary | ICD-10-CM

## 2020-09-22 DIAGNOSIS — N200 Calculus of kidney: Secondary | ICD-10-CM | POA: Diagnosis not present

## 2020-09-22 DIAGNOSIS — N401 Enlarged prostate with lower urinary tract symptoms: Secondary | ICD-10-CM | POA: Diagnosis not present

## 2020-09-22 DIAGNOSIS — R109 Unspecified abdominal pain: Secondary | ICD-10-CM

## 2020-09-22 DIAGNOSIS — R42 Dizziness and giddiness: Secondary | ICD-10-CM

## 2020-09-22 DIAGNOSIS — T887XXA Unspecified adverse effect of drug or medicament, initial encounter: Secondary | ICD-10-CM

## 2020-09-22 LAB — POCT GLYCOSYLATED HEMOGLOBIN (HGB A1C): Hemoglobin A1C: 6.6 % — AB (ref 4.0–5.6)

## 2020-09-22 MED ORDER — ACCU-CHEK AVIVA PLUS VI STRP: ORAL_STRIP | 3 refills | Status: DC

## 2020-09-22 MED ORDER — ACCU-CHEK SOFTCLIX LANCETS MISC: 3 refills | Status: DC

## 2020-09-22 MED ORDER — ACCU-CHEK AVIVA CONNECT W/DEVICE KIT: 1.0000 | PACK | Freq: Every day | 0 refills | Status: DC

## 2020-09-22 NOTE — Addendum Note (Signed)
Addended by: Wilson Singer on: 09/22/2020 09:29 AM   Modules accepted: Orders

## 2020-09-22 NOTE — Assessment & Plan Note (Signed)
POCT A1c today No urine microalbumin secondary to ARB therapy Encouraged him to consume a low-carb diet Will send in meter, strips and lancets Encourage routine eye exams Encourage routine foot exams Flu, Pneumovax, Prevnar and COVID UTD

## 2020-09-22 NOTE — Progress Notes (Signed)
Subjective:    Patient ID: Jonathan Willis, male    DOB: 12-06-1945, 75 y.o.   MRN: [id number]  HPI  Patient presents to clinic today for hospital and ER follow-up.  He went to the ER 09/02/2020 with complaint of cough, chest tightness and increased shortness of breath.  Chest x-ray was negative for infiltrate.  COVID test was negative.  He was diagnosed with COPD exacerbation and treated with IV steroids and bronchodilators.  He was discharged with oral prednisone, Meclizine, Trelegy and Albuterol.  He presented back to the ER 09/10/2020 with complaint of several days of dizziness and fatigue.  His daughter had noticed that he had been unsteady on his feet after taking Hycodan every 12 hours and meclizine every 8 hours as prescribed.  Labs revealed a slightly low sodium but otherwise unremarkable.  Chest x-ray was again negative for infiltrate.  ECG was unremarkable.  CT head showed general atrophy but no acute findings.  CT cervical spine showed multilevel degenerative changes but no acute abnormality.  It was determined that his dizziness, fatigue and unsteadiness were secondary to sedating medications and he was advised to stop these medications.  He was discharged with an Rx for Tessalon Perles and Prednisone.  Since discharge, he denies dizziness or cough. He has chronic shortness of breath. He is using his inhalers as prescribed. He wears oxygen as needed at home. He does not smoke.  His daughter also wants to follow-up CT abdomen pelvis from 07/20/2020.  CT scan noted multiple small bilateral renal cyst and small nonobstructive right renal calculus.  She reports they advised him to follow-up with urology for lithotripsy for this.  She would need a referral today for this.  He does report intermittent right-sided back pain.  He does have urinary frequency but denies retention or blood in his urine.  He is taking Flomax as prescribed.  He is also due to follow-up diabetes.  His last A1c was 6.5%, 03/2020.   He is not taking any oral diabetic medication at this time.  He is not checking her sugars because his machine is broken he would like a new one today.  He does not check his feet routinely.  He has not seen an eye doctor in over a year.  Flu 11/2017.  Pneumovax 03/2018.  Prevnar 12/2016.  [place] x3.  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 8 g 2   benzonatate (TESSALON PERLES) 100 MG capsule Take 1-2 tabs TID prn cough 30 capsule 0   chlorpheniramine-HYDROcodone (TUSSIONEX) 10-8 MG/5ML SUER Take 5 mLs by mouth every 12 (twelve) hours.     Fluticasone-Umeclidin-Vilant (TRELEGY ELLIPTA) 100-62.5-25 MCG/INH AEPB Inhale 1 puff into the lungs daily. 30 each 5   ipratropium-albuterol (DUONEB) 0.5-2.5 (3) MG/3ML SOLN  Inhale 3 mLs into the lungs every 6 (six) hours as needed. 360 mL 0   meclizine (ANTIVERT) 12.5 MG tablet Take 1 tablet (12.5 mg total) by mouth 3 (three) times daily as needed for dizziness. 90 tablet 1   oxybutynin (DITROPAN-XL) 5 MG 24 hr tablet TAKE 1 TABLET BY MOUTH EVERY DAY 90 tablet 0   Spacer/Aero-Hold Chamber Bags MISC 1 Device by Does not apply route every 6 (six) hours as needed (with inhaler). 1 each 1   tamsulosin (FLOMAX) 0.4 MG CAPS capsule TAKE 2 CAPSULES BY MOUTH EVERY DAY 180 capsule 0   telmisartan (MICARDIS) 40 MG  tablet Take 1 tablet (40 mg total) by mouth daily. 90 tablet 1   No current facility-administered medications for this visit.    No Known Allergies  Family History  Problem Relation Age of Onset   COPD Neg Hx    Diabetes Mellitus II Neg Hx    Hypertension Neg Hx     Social History   Socioeconomic History   Marital status: Divorced    Spouse name: Not on file   Number of children: Not on file   Years of education: 9   Highest education level: 9th grade  Occupational History   Occupation: retired  Tobacco Use   Smoking status: Former    Packs/day: 1.50    Years: 44.00    Pack years: 66.00    Types: Cigarettes    Quit date: 01/11/2007    Years since quitting: 13.7   Smokeless tobacco: Former  Scientific laboratory technician Use: Never used  Substance and Sexual Activity   Alcohol use: No   Drug use: No   Sexual activity: Yes    Birth control/protection: None  Other Topics Concern   Not on file  Social History Narrative   Not on file   Social Determinants of Health   Financial Resource Strain: Not on file  Food Insecurity: Not on file  Transportation Needs: Not on file  Physical Activity: Not on file  Stress: Not on file  Social Connections: Not on file  Intimate Partner Violence: Not on file     Constitutional: Denies fever, malaise, fatigue, headache or abrupt weight changes.  HEENT: Denies eye pain, eye redness, ear pain, ringing in the ears, wax buildup, runny nose, nasal congestion, bloody nose, or sore throat. Respiratory: Patient reports shortness of breath.  Denies difficulty breathing, cough or sputum production.   Cardiovascular: Denies chest pain, chest tightness, palpitations or swelling in the hands or feet.  Gastrointestinal: Patient reports right flank pain.  Denies abdominal pain, bloating, constipation, diarrhea or blood in the stool.  GU: Patient reports urinary frequency.  Denies urgency, pain with urination, burning sensation, blood in urine, odor or  discharge. Musculoskeletal: Denies decrease in range of motion, difficulty with gait, muscle pain or joint pain and swelling.  Skin: Denies redness, rashes, lesions or ulcercations.  Neurological: Denies dizziness, difficulty with memory, difficulty with speech or problems with balance and coordination.  Psych: Denies anxiety, depression, SI/HI.  No other specific complaints in a complete review of systems (except as listed in HPI above).     Objective:   Physical Exam   BP (!) 142/56 (BP Location: Right Arm, Patient Position: Sitting, Cuff Size: Small)   Pulse 78   Temp (!) 97.5 F (36.4 C) (Temporal)   Resp 20   Ht 5\' 1"  (1.549 m)   Wt 101 lb 9.6 oz (46.1 kg)   SpO2 100%  BMI 19.20 kg/m   Wt Readings from Last 3 Encounters:  09/10/20 108 lb (49 kg)  09/02/20 108 lb (49 kg)  07/20/20 106 lb (48.1 kg)    General: Appears his stated age, well developed, well nourished in NAD. Skin: Warm, dry and intact.  HEENT: Head: normal shape and size; Eyes: sclera white and EOMs intact;  Neck:  Neck supple, trachea midline. No masses, lumps or thyromegaly present.  Cardiovascular: Normal rate and rhythm. S1,S2 noted.  No murmur, rubs or gallops noted. No JVD or BLE edema. No carotid bruits noted. Pulmonary/Chest: Normal effort and positive vesicular breath sounds. No respiratory distress. No wheezes, rales or ronchi noted.  Abdomen: Soft and nontender. Normal bowel sounds. No distention or masses noted.  No CVA tenderness noted. Musculoskeletal: No difficulty with gait.  Neurological: Alert and oriented. Cranial nerves II-XII grossly intact. Coordination normal.   BMET    Component Value Date/Time   NA 131 (L) 09/10/2020 1245   NA 136 02/04/2014 0616   K 4.1 09/10/2020 1245   K 4.1 02/04/2014 0616   CL 99 09/10/2020 1245   CL 102 02/04/2014 0616   CO2 29 09/10/2020 1245   CO2 25 02/04/2014 0616   GLUCOSE 176 (H) 09/10/2020 1245   GLUCOSE 225 (H) 02/04/2014 0616   BUN 17  09/10/2020 1245   BUN 18 02/04/2014 0616   CREATININE 0.97 09/10/2020 1245   CREATININE 1.01 10/16/2018 0928   CALCIUM 8.7 (L) 09/10/2020 1245   CALCIUM 9.1 02/04/2014 0616   GFRNONAA >60 09/10/2020 1245   GFRNONAA 73 10/16/2018 0928   GFRAA >60 06/06/2019 1049   GFRAA 85 10/16/2018 0928    Lipid Panel     Component Value Date/Time   CHOL 179 10/16/2018 0928   TRIG 208 (H) 10/16/2018 0928   HDL 49 10/16/2018 0928   CHOLHDL 3.7 10/16/2018 0928   LDLCALC 98 10/16/2018 0928    CBC    Component Value Date/Time   WBC 8.9 09/10/2020 1245   RBC 4.39 09/10/2020 1245   HGB 14.0 09/10/2020 1245   HGB 14.1 02/06/2014 0345   HCT 40.4 09/10/2020 1245   HCT 42.3 02/06/2014 0345   PLT 264 09/10/2020 1245   PLT 336 02/06/2014 0345   MCV 92.0 09/10/2020 1245   MCV 95 02/06/2014 0345   MCH 31.9 09/10/2020 1245   MCHC 34.7 09/10/2020 1245   RDW 11.9 09/10/2020 1245   RDW 13.0 02/06/2014 0345   LYMPHSABS 2,253 06/29/2017 1121   LYMPHSABS 1.7 02/06/2014 0345   MONOABS 1.1 (H) 01/21/2017 0530   MONOABS 0.5 02/06/2014 0345   EOSABS 128 06/29/2017 1121   EOSABS 0.0 02/06/2014 0345   BASOSABS 51 06/29/2017 1121   BASOSABS 0.0 02/06/2014 0345    Hgb A1C Lab Results  Component Value Date   HGBA1C 6.5 (A) 04/16/2020          Assessment & Plan:   Hospital follow-up for COPD Exacerbation, Vertigo, Medication Side Effects:  Hospital notes, labs and imaging reviewed Medications reviewed-continue Trelegy and albuterol Continue oxygen as prescribed  Right Flank Pain secondary to Renal Calculus, BPH:  Advised him if this was small and he was asymptomatic that urology may not intervene at this time Referral to urology placed per daughter-in-law's insistence Continue Flomax Encourage adequate fluid intake  Return precautions discussed Webb Silversmith, NP This visit occurred during the SARS-CoV-2 public health emergency.  Safety protocols were in place, including screening  questions prior to the visit, additional usage of  staff PPE, and extensive cleaning of exam room while observing appropriate contact time as indicated for disinfecting solutions.

## 2020-09-22 NOTE — Patient Instructions (Signed)
S?i th?n Kidney Stones  S?i th?n l ph?n l?ng c?n r?n, gi?ng nh? ?, hnh thnh bn trong th?n. Th?n l m?t c?p c? quan t?o ra n??c ti?u. S?i th?n c th? hnh thnh trong th?n v di chuy?n vo cc ph?n khc c?a ???ng ti?u, bao g?m cc ?ng k?t n?i th?n v?i bng quang (ni?u qu?n), bng quang v ?ng d?n n??c ti?u ra kh?i c? th? (ni?u ??o). Khi s?i di chuy?n qua nh?ng khu v?c ny, n c th? gy ?au d? d?i v ch?ndng n??c ti?u. S?i th?n ???c t?o ra khi n?ng ?? cc khong ch?t nh?t ??nh c trong n??c ti?u ? m?c cao. S?i th??ng ???c th?i ra kh?i c? th? qua ti?u ti?n, nh?ng trong m?t s?tr??ng h?p, c th? c?n ph?i ?i?u tr? ?? lo?i b? s?i. Nguyn nhn g gy ra? S?i th?n c th? do: M?t tnh tr?ng m trong ? cc tuy?n nh?t ??nh t?o ra qu nhi?u hormon tuy?t c?n gip (c??ng c?n gip nguyn pht), d?n ??n tch c? qu nhi?u canxi trong mu. Tch t? cc tinh th? axit uric trong bng quang (t?ng uric ni?u). Axit uric l ch?t ha h?c do c? th? s?n sinh ra khi qu v? ?n m?t s? lo?i th?c ph?m nh?t ??nh. Ch?t ny th??ng ra kh?i c? th? qua n??c ti?u. H?p (cht h?p) m?t ho?c c? hai ni?u qu?n. C m?t ch? t?c ngh?n ? th?n khi sinh ra (t?c ngh?n b?m sinh). Ph?u thu?t ? th?n ho?c ni?u qu?n tr??c ?y, ch?ng h?n nh? ph?u thu?t b?c c?u d? dy. ?i?u g lm t?ng nguy c?? Nh?ng y?u t? sau c th? khi?n cho qu v? d? b? tnh tr?ng ny h?n: C ti?n s? b? s?i th?n. C ti?n s? gia ?nh b? s?i th?n. Khng u?ng ?? n??c. ?n ch? ?? ?n giu protein, mu?i (natri), ho?c ???ng. Th?a cn ho?c bo ph. C cc d?u hi?u ho?c tri?u ch?ng g? Cc tri?u ch?ng s?i th?n c th? bao g?m: ?au ? c?nh b?ng, bn ph?i, d??i x??ng s??n (?au m?n s??n). ?au th??ng lan (t?a ra) xu?ng b?n. C?n ?i ti?u th??ng xuyn ho?c mt ti?u. ?au khi ti?u ti?n. Ma?u trong n???c ti?u (ti?u ra mu). Bu?n nn. Nn. S?t v ?n l?nh. Ch?n ?on tnh tr?ng ny nh? th? no? Tnh tr?ng ny c th? ???c ch?n ?on d?a vo: Tri?u ch?ng v b?nh s? c?a qu v?. Khm th?c  th?. Xt nghi?m mu. Xt nghi?m n??c ti?u. Nh?ng xt nghi?m ny c th? ???c th?c hi?n tr??c v sau khi s?i ?i ra ngoi c? th? qu v? qua ti?u ti?n. Cc ki?m tra hnh ?nh, ch?ng h?n nh? ch?p c?t l?p vi tnh (CT), ch?p X-quang vng b?ng, ho?c siu m. M?t th? thu?t ?? khm bn trong bng quang (n?i soi bng quang). Tnh tr?ng ny ???c ?i?u tr? nh? th? no? ?i?u tr? s?i th?n ph? thu?c vo kch th??c, v? tr v thnh ph?n c?a s?i. S?i th?n th??ng ?i ra kh?i c? th? qua ti?u ti?n. Qu v? c th? c?n: U?ng nhi?u n??c h?n ?? gip s?i ?i ra. Trong m?t s? tr??ng h?p, qu v? c th? ???c truy?n d?ch qua t?nh m?ch v c th? c?n ???c theo di t?i b?nh vi?n. Dng thu?c gi?m ?au. Thay ??i ch? ?? ?n u?ng c?a qu v? ?? gip ng?n ng?a s?i th?n ti pht. ?i khi, c?n th? thu?t y [place] ?? lo?i b? s?i th?n. Vi?c ny c th? bao g?m: Th? thu?t tn s?i th?n s?  d?ng: Chm nh sng h?i t? (li?u php laser). Sng xung kch (tn s?i b?ng sng xung kch ngoi c? th?). Ph?u thu?t lo?i b? s?i th?n. Vi?c ny c th? c?n thi?t n?u qu v? b? ?au d? d?i ho?c c s?i lm t?c ???ng ti?u. Tun th? nh?ng h??ng d?n ny ? nh: Thu?c Ch? s? d?ng thu?c khng k ??n v thu?c k ??n theo ch? d?n c?a chuyn gia ch?m [place] s?c kh?e. Hy h?i chuyn gia ch?m [place] s?c kh?e xem qu v? c c?n ph?i trnh li xe ho?c trnh s? d?ng my mc h?ng n?ng khi dng lo?i thu?c k ??n cho qu v? khng. ?n v u?ng U?ng ?? n??c ?? gi? cho n??c ti?u c mu vng nh?t. Qu v? c th? ???c ch? d?n u?ng t nh?t 8-10 ly n??c m?i ngy. Vi?c ny s? gip qu v? th?i s?i th?n ra ngoi. N?u ???c ch? d?n, hy thay ??i ch? ?? ?n. Vi?c ny c th? bao g?m: Gi?i h?n l??ng natri qu v? ?n vo. ?n nhi?u tri cy v rau c? h?n. H?n ch? l??ng protein ??ng v?t, ch?ng h?n nh? th?t ??, th?t gia c?m, c v tr?ng m qu v? ?n vo. Tun th? ch? d?n c?a chuyn gia ch?m [place] s?c kh?e v? cc h?n ch? v? ch? ?? ?n ho?c u?ng. H??ng d?n chung L?y m?u n??c ti?u theo ch? d?n c?a chuyn gia ch?m [place]  s?c kh?e. Qu v? c th? c?n l?y m?t m?u n??c ti?u: Sau khi qu v? th?i s?i ra ngoi 24 gi?. 8-12 tu?n sau khi th?i s?i th?n v sau ? l 6-12 thng m?t l?n. L?c n??c ti?u m?i l?n qu v? ?i ti?u, theo th?i gian ch? d?n. S? d?ng d?ng c? l?c m chuyn gia ch?m [place] s?c kh?e c?a qu v? khuy?n ngh?Maggie [place]wal[place] v?t b? s?i th?n sau khi qu v? th?i ra ngoi. Gi? s?i ? l?i ?? chuyn gia ch?m [place] s?c kh?e c th? ki?m tra. Ki?m tra thnh ph?n c?a s?i th?n c th? gip qu v? phng ng?a s?i th?n trong t??ng lai. Tun th? t?t c? cc l?n khm theo di theo ch? d?n c?a chuyn gia ch?m [place] s?c kh?e. ?i?u ny c vai tr quan tr?ng. Qu v? c th? c?n ch?p X-quang ho?c siu m theo di ?? ??m b?o r?ng s?i c?a qu v? ? ???c th?i ra ngoi. Ng?n ng?a tnh tr?ng ny b?ng cch no? ?? ng?n ng?a m?t s?i th?n khc: U?ng ?? n??c ?? gi? cho n??c ti?u c mu vng nh?t. ?y l cch t?t nh?t ?? ng?n ng?a s?i th?n. ?n ch? ?? ?n c l?i cho s?c kh?e v tun th? cc khuy?n ngh? c?a chuyn gia ch?m [place] s?c kh?e v? cc th?c ph?m c?n trnh. Qu v? c th? ???c h??ng d?n ?n kh?u ph?n t protein. Cc khuy?n ngh? thay ??i ty thu?c vo lo?i s?i th?n m qu v? c. Duy tr cn n?ng c l?i cho s?c kh?e. N?i ?? tm thm thng tin [employer]? Th?n Qu?c gia, [facility]): [email or web address] Nephew? Ch?m [place] Ti?t ni?u, [facility]): [email or web address] Hy lin l?c v?i chuyn gia ch?m [place] s?c kh?e n?u: N?u quy? vi? bi? ?au tr?m tr?ng h?n ho??c khng ??? sau khi du?ng thu?c. Yu c?u tr? gip ngay l?p t?c n?u: Qu v? b? s?t ho?c ?n l?nh. Qu v? b? ?au d? d?i. Qu v? c c?n ?au b?ng m?i. Qu v? b? ng?t. Qu v? khng th? ?i ti?u ???c.  Tm t?t S?i th?n l ph?n l?ng c?n r?n, gi?ng nh? ?, hnh thnh bn trong th?n. S?i th?n c th? gy bu?n nn, nn, mu trong n??c ti?u, ?au b?ng v mt ti?u th??ng xuyn. ?i?u tr? s?i th?n ph? thu?c vo kch th??c, v? tr v thnh ph?n c?a s?i. S?i th?n th??ng ?i ra kh?i c? th? qua ti?u  ti?n. S?i th?n c th? ???c ng?n ng?a b?ng cch u?ng ?? ch?t l?ng, ?n m?t ch? ?? ?n c l?i cho s?c kh?e v duy tr cn n?ng c l?i cho s?c kh?e. Thng tin ny khng nh?m m?c ?ch thay th? cho l?i khuyn m chuyn gia ch?m [employer] s?c kh?e ni v?i qu v?. Hy b?o ??m qu v? ph?i th?o lu?n b?t k? v?n ?? gm qu v? c v?i chuyn gia ch?m [place] s?c kh?e c?a qu v?. Document Revised: 09/27/2018 Document Reviewed: 09/27/2018 Elsevier Patient Education  2022 [employer].

## 2020-10-05 ENCOUNTER — Ambulatory Visit: Payer: Medicare Other | Admitting: Urology

## 2020-10-05 NOTE — Progress Notes (Deleted)
 10/05/2020 8:18 AM   Jonathan Willis 03/22/1946 [id number]  Referring provider: Baity, Regina W, NP [address] [place],  [place] [address]  No chief complaint on file.   HPI: 74 y.o. 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 If you have any questions, ask your nurse or doctor.          Accu-Chek Aviva Connect w/Device Kit 1 Device by Does not apply route daily. Dx E11.4   Accu-Chek Aviva Plus test strip Generic drug: glucose blood USE 1 TEST STRIP TO TEST BLOOD SUGAR DAILY   Accu-Chek Softclix Lancets lancets USE TO TEST ONCE DAILY   albuterol 108 (90 Base) MCG/ACT inhaler Commonly known as: VENTOLIN HFA Inhale 2 puffs into the lungs every 6 (six) hours as needed for wheezing or shortness of breath.   benzonatate 100 MG capsule Commonly known as: Tessalon Perles Take 1-2 tabs TID prn cough   Spacer/Aero-Hold Chamber Bags Misc 1 Device by Does not apply route every 6 (six)  hours as needed (with inhaler).   tamsulosin 0.4 MG Caps capsule Commonly known as: FLOMAX TAKE 2 CAPSULES BY MOUTH EVERY DAY   telmisartan 40 MG tablet Commonly known as: MICARDIS Take 1 tablet (40 mg total) by mouth daily.   Trelegy Ellipta 100-62.5-25 MCG/INH Aepb Generic drug: Fluticasone-Umeclidin-Vilant Inhale 1 puff into the lungs daily.        Allergies: No Known Allergies  Family History: Family History  Problem Relation Age of Onset   COPD Neg Hx    Diabetes Mellitus II Neg Hx    Hypertension Neg Hx     Social History:  reports that he quit smoking about 13 years ago. His smoking use included cigarettes. He has a 66.00 pack-year smoking history. He has quit using smokeless tobacco. He reports that he does not drink alcohol and does not use drugs.   Physical Exam: There were no vitals taken for this visit.  Constitutional:  Alert and oriented, No acute distress. HEENT:  AT, moist mucus membranes.  Trachea midline, no masses. Cardiovascular: No clubbing, cyanosis, or edema. Respiratory: Normal respiratory effort, no increased work of breathing. GI: Abdomen is soft, nontender, nondistended, no abdominal masses GU: No CVA tenderness Lymph: No cervical or inguinal lymphadenopathy. Skin: No rashes, bruises or suspicious lesions. Neurologic: Grossly intact, no focal deficits, moving all 4 extremities. Psychiatric: Normal mood and affect.  Laboratory Data: Lab   Results  Component Value Date   WBC 8.9 09/10/2020   HGB 14.0 09/10/2020   HCT 40.4 09/10/2020   MCV 92.0 09/10/2020   PLT 264 09/10/2020    Lab Results  Component Value Date   CREATININE 0.97 09/10/2020    Lab Results  Component Value Date   PSA 1.1 01/01/2018   PSA 1.6 06/29/2017   PSA 0.8 02/01/2014    No results found for: TESTOSTERONE  Lab Results  Component Value Date   HGBA1C 6.6 (A) 09/22/2020    Urinalysis    Component Value Date/Time   COLORURINE STRAW (A) 07/20/2020 1010    APPEARANCEUR CLEAR (A) 07/20/2020 1010   APPEARANCEUR Clear 02/26/2018 0834   LABSPEC 1.006 07/20/2020 1010   LABSPEC 1.023 02/01/2014 2216   PHURINE 6.0 07/20/2020 1010   GLUCOSEU 150 (A) 07/20/2020 1010   GLUCOSEU 50 mg/dL 02/01/2014 2216   HGBUR NEGATIVE 07/20/2020 1010   BILIRUBINUR NEGATIVE 07/20/2020 1010   BILIRUBINUR negative 07/10/2019 0845   BILIRUBINUR Negative 02/26/2018 0834   BILIRUBINUR Negative 02/01/2014 2216   KETONESUR NEGATIVE 07/20/2020 1010   PROTEINUR NEGATIVE 07/20/2020 1010   UROBILINOGEN 0.2 07/10/2019 0845   NITRITE NEGATIVE 07/20/2020 1010   LEUKOCYTESUR NEGATIVE 07/20/2020 1010   LEUKOCYTESUR Negative 02/01/2014 2216    Lab Results  Component Value Date   LABMICR See below: 02/26/2018   WBCUA None seen 02/26/2018   RBCUA 0-2 02/26/2018   LABEPIT None seen 02/26/2018   MUCUS Present (A) 02/26/2018   BACTERIA NONE SEEN 07/20/2020    Pertinent Imaging: *** No results found for this or any previous visit.  No results found for this or any previous visit.  No results found for this or any previous visit.  No results found for this or any previous visit.  No results found for this or any previous visit.  No results found for this or any previous visit.  No results found for this or any previous visit.  No results found for this or any previous visit.   Assessment & Plan:    There are no diagnoses linked to this encounter.  No follow-ups on file.   C , MD  [facility] [address], [address] [place], [place] [address] [phone]   

## 2020-10-06 ENCOUNTER — Ambulatory Visit (INDEPENDENT_AMBULATORY_CARE_PROVIDER_SITE_OTHER): Payer: Medicare Other | Admitting: Physician Assistant

## 2020-10-06 ENCOUNTER — Other Ambulatory Visit: Payer: Self-pay

## 2020-10-06 ENCOUNTER — Encounter: Payer: Self-pay | Admitting: Physician Assistant

## 2020-10-06 VITALS — BP 148/55 | HR 109 | Ht 60.0 in | Wt 101.0 lb

## 2020-10-06 DIAGNOSIS — N200 Calculus of kidney: Secondary | ICD-10-CM

## 2020-10-06 DIAGNOSIS — R350 Frequency of micturition: Secondary | ICD-10-CM

## 2020-10-06 DIAGNOSIS — N401 Enlarged prostate with lower urinary tract symptoms: Secondary | ICD-10-CM | POA: Diagnosis not present

## 2020-10-06 LAB — BLADDER SCAN AMB NON-IMAGING

## 2020-10-06 MED ORDER — MIRABEGRON ER 25 MG PO TB24: 25.0000 mg | ORAL_TABLET | Freq: Every day | ORAL | 0 refills | Status: DC

## 2020-10-06 NOTE — Progress Notes (Signed)
10/06/2020 2:23 PM   Jonathan Willis November 17, 1945 [id number]  CC: Chief Complaint  Patient presents with   Nephrolithiasis   HPI: Jonathan Willis is a 75 y.o. male with PMH diabetes and BPH with LUTS including storage related symptoms on Flomax who presents today in consultation for a right renal stone.  He also wishes to discuss his ongoing voiding symptoms.  He is accompanied today by his daughter-in-law, who contributes to HPI.  Visit assisted by video interpreter.  He was last seen in our clinic by Dr. Bernardo Willis on 04/04/2018, at which point he reported bothersome frequency and nocturia x5.  He had previously been prescribed Vesicare, but not filled this prescription.  He was again encouraged to start a trial of Vesicare and follow-up in 4 weeks, however he was lost to follow-up.  Today he reports having taken Vesicare with no symptomatic improvement.  He continues to report bothersome urgency, frequency every 1-2 hours, and nocturia x4-5.  He denies urinary leakage.  He continues to take Flomax 0.8 mg daily. PVR 15mL.  Additionally, patient reports a history of occasional, severe RLQ pain that occurs approximately 1-2 times per month and last 1 to 3 hours each time.  He states he has a difficult time touching his right lower abdomen when the pain occurs.  This occurred most recently 4 days ago.  He denies dysuria, gross hematuria, nausea, and vomiting associated with these pain symptoms.  He has been seen in urgent care 3 times for evaluation of the symptoms, with no significant findings.  He does have a history of constipation.  He underwent CTAP with contrast on 07/20/2020, which revealed small, bilateral simple renal cysts as well as a 4 mm nonobstructing right lower pole stone.  Notably, the scan was done in the emergency department when he presented with reports of right upper quadrant abdominal pain and diarrhea.  UA at that time was negative for hematuria, pyuria, or bacteriuria.  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 If you have any questions, ask your nurse or doctor.          Accu-Chek Aviva Connect w/Device Kit 1 Device by Does not apply route daily. Dx E11.4   Accu-Chek Aviva Plus test strip Generic drug: glucose blood USE 1 TEST STRIP TO TEST BLOOD SUGAR DAILY   Accu-Chek Softclix Lancets lancets USE TO TEST ONCE DAILY   albuterol 108 (90 Base) MCG/ACT inhaler Commonly known as: VENTOLIN HFA Inhale 2 puffs into the lungs every 6 (six) hours as needed for wheezing or shortness of breath.   benzonatate 100 MG capsule Commonly known as: Tessalon Perles Take 1-2 tabs TID prn cough   Spacer/Aero-Hold Chamber Bags Misc 1 Device by  Does not apply route every 6 (six) hours as needed (with inhaler).   tamsulosin 0.4 MG Caps capsule Commonly known as: FLOMAX TAKE 2 CAPSULES BY MOUTH EVERY DAY   telmisartan 40 MG tablet Commonly known as: MICARDIS Take 1 tablet (40 mg total) by mouth daily.   Trelegy Ellipta 100-62.5-25 MCG/INH Aepb Generic drug: Fluticasone-Umeclidin-Vilant Inhale 1 puff into the lungs daily.        Allergies:  No Known Allergies  Family History: Family History  Problem Relation Age of Onset   COPD Neg Hx    Diabetes Mellitus II Neg Hx    Hypertension Neg Hx      Social History:   reports that he quit smoking about 13 years ago. His smoking use included cigarettes. He has a 66.00 pack-year smoking history. He has quit using smokeless tobacco. He reports that he does not drink alcohol and does not use drugs.  Physical Exam: BP (!) 148/55   Pulse (!) 109   Ht 5' (1.524 m)   Wt 101 lb (45.8 kg)   BMI 19.73 kg/m   Constitutional:  Alert and oriented, no acute distress, nontoxic appearing HEENT: [place], AT Cardiovascular: No clubbing, cyanosis, or edema Respiratory: Normal respiratory effort, no increased work of breathing Skin: No rashes, bruises or suspicious lesions Neurologic: Grossly intact, no focal deficits, moving all 4 extremities Psychiatric: Normal mood and affect  Laboratory Data: Results for orders placed or performed in visit on 10/06/20  Bladder Scan (Post Void Residual) in office  Result Value Ref Range   Scan Result 53mL    Assessment & Plan:   1. Right renal stone Patient has a 4 mm nonobstructing right lower pole stone per recent CT scan.  While I cannot definitively rule out ball valving stone as the source of his pain, I think this is unlikely in the absence of other acute stone symptoms including nausea, vomiting, flank pain, and gross hematuria.  We discussed that I recommend conservative management for the stone with plans for intervention if he develops gross hematuria, dysuria, or nausea/vomiting associated with his pain.  Counseled him to return to clinic if this occur for further evaluation.  We discussed proactive stone management, and the risk/benefits of pursuing treatment in the absence of definitive evidence that the stone is causing his pain and he is in agreement that we should defer this.  We will plan for UA at his next clinic visit to reassess for microscopic hematuria, though per chart review there is no history of this. - Bladder Scan (Post Void Residual) in office  2. Benign prostatic hyperplasia with  urinary frequency Failed Vesicare.  Okay to continue Flomax and will start a trial of Myrbetriq 25 mg daily with plans for symptom recheck and PVR upon completion. - mirabegron ER (MYRBETRIQ) 25 MG TB24 tablet; Take 1 tablet (25 mg total) by mouth daily.  Dispense: 28 tablet; Refill: 0  Return in about 4 weeks (around 11/03/2020) for Symptom recheck with PVR.  Debroah Loop, PA-C  [facility] [address] [place], [place] [address] [phone]

## 2020-10-12 DIAGNOSIS — F17218 Nicotine dependence, cigarettes, with other nicotine-induced disorders: Secondary | ICD-10-CM | POA: Diagnosis not present

## 2020-10-12 DIAGNOSIS — J449 Chronic obstructive pulmonary disease, unspecified: Secondary | ICD-10-CM | POA: Diagnosis not present

## 2020-10-14 ENCOUNTER — Ambulatory Visit: Payer: Medicare Other | Admitting: Internal Medicine

## 2020-11-04 ENCOUNTER — Ambulatory Visit (INDEPENDENT_AMBULATORY_CARE_PROVIDER_SITE_OTHER): Payer: Medicare Other | Admitting: Physician Assistant

## 2020-11-04 ENCOUNTER — Encounter: Payer: Self-pay | Admitting: Physician Assistant

## 2020-11-04 ENCOUNTER — Other Ambulatory Visit: Payer: Self-pay

## 2020-11-04 VITALS — BP 153/71 | HR 79 | Ht 61.0 in | Wt 103.0 lb

## 2020-11-04 DIAGNOSIS — N401 Enlarged prostate with lower urinary tract symptoms: Secondary | ICD-10-CM

## 2020-11-04 DIAGNOSIS — N200 Calculus of kidney: Secondary | ICD-10-CM

## 2020-11-04 DIAGNOSIS — R350 Frequency of micturition: Secondary | ICD-10-CM | POA: Diagnosis not present

## 2020-11-04 LAB — BLADDER SCAN AMB NON-IMAGING

## 2020-11-04 MED ORDER — MIRABEGRON ER 25 MG PO TB24: 25.0000 mg | ORAL_TABLET | Freq: Every day | ORAL | 11 refills | Status: DC

## 2020-11-04 NOTE — Progress Notes (Signed)
11/04/2020 2:14 PM   Jonathan Willis September 07, 1945 [id number]  CC: Chief Complaint  Patient presents with   Follow-up   HPI: Jonathan Willis is a 75 y.o. male with PMH diabetes, BPH with LUTS including storage related symptoms on Flomax, right renal stone, and intermittent RLQ discomfort who presents today for symptom recheck on Myrbetriq 25 mg daily.  He is accompanied today by his daughter-in-law, who contributes to HPI and assists in interpretation.  They decline a video interpreter today.  Today he reports significant symptomatic improvement on Myrbetriq 25 mg daily.  He is now reporting daytime frequency x4, previously every 1-2 hours, as well as nocturia x1, previously x4-5.  He continues to deny urinary leakage.  Overall, he is very pleased with the medication.  His daughter-in-law reports that this was a welcome improvement given that he has failed other medications in the past to help with the symptoms..  In-office UA and microscopy today pan negative. PVR 52mL.  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 If you have any questions, ask your nurse or doctor.          Accu-Chek Aviva Connect w/Device Kit 1 Device by Does not apply route daily. Dx E11.4   Accu-Chek Aviva Plus test strip Generic drug: glucose  blood USE 1 TEST STRIP TO TEST BLOOD SUGAR DAILY   Accu-Chek Softclix Lancets lancets USE TO TEST ONCE DAILY   albuterol 108 (90 Base) MCG/ACT inhaler Commonly known as: VENTOLIN HFA Inhale 2 puffs into the lungs every 6 (six) hours as needed for wheezing or shortness of breath.   benzonatate 100 MG capsule Commonly known as: Tessalon Perles Take 1-2 tabs TID prn cough   mirabegron ER 25 MG Tb24 tablet Commonly known as: MYRBETRIQ Take 1 tablet (25 mg total) by mouth daily.   Spacer/Aero-Hold Chamber Bags Misc 1 Device by Does not apply route every 6 (six) hours as needed (with inhaler).   tamsulosin 0.4 MG Caps capsule Commonly known as: FLOMAX TAKE 2 CAPSULES BY MOUTH EVERY DAY   telmisartan 40 MG tablet Commonly known as: MICARDIS Take 1 tablet (40 mg total) by mouth daily.   Trelegy Ellipta 100-62.5-25 MCG/INH Aepb Generic drug: Fluticasone-Umeclidin-Vilant Inhale 1 puff into the lungs daily.        Allergies:  No Known Allergies  Family History: Family History  Problem Relation Age of Onset   COPD Neg Hx    Diabetes Mellitus II Neg Hx    Hypertension Neg Hx     Social History:   reports that he quit smoking about 13 years ago. His smoking use included cigarettes. He has a 66.00 pack-year smoking history. He has quit using smokeless tobacco. He reports that  he does not drink alcohol and does not use drugs.  Physical Exam: BP (!) 153/71   Pulse 79   Ht _0  (1.549 m)   Wt 103 lb (46.7 kg)   BMI 19.46 kg/m   Constitutional:  Alert and oriented, no acute distress, nontoxic appearing HEENT: [place], AT Cardiovascular: No clubbing, cyanosis, or edema Respiratory: Normal respiratory effort, no increased work of breathing Skin: No rashes, bruises or suspicious lesions Neurologic: Grossly intact, no focal deficits, moving all 4 extremities Psychiatric: Normal mood and affect  Laboratory Data: Results for orders placed or performed in visit on 11/04/20   Microscopic Examination   Urine  Result Value Ref Range   WBC, UA None seen 0 - 5 /hpf   RBC None seen 0 - 2 /hpf   Epithelial Cells (non renal) 0-10 0 - 10 /hpf   Bacteria, UA None seen None seen/Few  Urinalysis, Complete  Result Value Ref Range   Specific Gravity, UA 1.025 1.005 - 1.030   pH, UA 5.5 5.0 - 7.5   Color, UA Yellow Yellow   Appearance Ur Clear Clear   Leukocytes,UA Negative Negative   Protein,UA Negative Negative/Trace   Glucose, UA Negative Negative   Ketones, UA Negative Negative   RBC, UA Negative Negative   Bilirubin, UA Negative Negative   Urobilinogen, Ur 0.2 0.2 - 1.0 mg/dL   Nitrite, UA Negative Negative   Microscopic Examination See below:   Bladder Scan (Post Void Residual) in office  Result Value Ref Range   Scan Result 40m    Assessment & Plan:   1. Benign prostatic hyperplasia with urinary frequency Significant symptomatic improvement in frequency and nocturia on Myrbetriq.  We will plan to continue this and Flomax and will see him back next year for annual follow-up. - Bladder Scan (Post Void Residual) in office - mirabegron ER (MYRBETRIQ) 25 MG TB24 tablet; Take 1 tablet (25 mg total) by mouth daily.  Dispense: 30 tablet; Refill: 11  2. Right renal stone There remains no clinical evidence of ball valving right renal stone and his UA remains completely clear today.  Continue to recommend conservative management of the stone. - Urinalysis, Complete  Return in about 1 year (around 11/04/2021) for Symptom recheck with PVR.  SDebroah Loop PA-C  [facility] [address] [place] [phone]

## 2020-11-05 LAB — URINALYSIS, COMPLETE
Bilirubin, UA: NEGATIVE
Glucose, UA: NEGATIVE
Ketones, UA: NEGATIVE
Leukocytes,UA: NEGATIVE
Nitrite, UA: NEGATIVE
Protein,UA: NEGATIVE
RBC, UA: NEGATIVE
Specific Gravity, UA: 1.025 (ref 1.005–1.030)
Urobilinogen, Ur: 0.2 mg/dL (ref 0.2–1.0)
pH, UA: 5.5 (ref 5.0–7.5)

## 2020-11-05 LAB — MICROSCOPIC EXAMINATION
Bacteria, UA: NONE SEEN
RBC, Urine: NONE SEEN /hpf (ref 0–2)
WBC, UA: NONE SEEN /hpf (ref 0–5)

## 2020-11-11 ENCOUNTER — Other Ambulatory Visit: Payer: Self-pay

## 2020-11-11 DIAGNOSIS — J431 Panlobular emphysema: Secondary | ICD-10-CM

## 2020-11-11 MED ORDER — ALBUTEROL SULFATE HFA 108 (90 BASE) MCG/ACT IN AERS: 2.0000 | INHALATION_SPRAY | Freq: Four times a day (QID) | RESPIRATORY_TRACT | 6 refills | Status: DC | PRN

## 2021-02-20 ENCOUNTER — Other Ambulatory Visit: Payer: Self-pay

## 2021-02-20 ENCOUNTER — Emergency Department: Payer: Medicare Other

## 2021-02-20 ENCOUNTER — Inpatient Hospital Stay
Admission: EM | Admit: 2021-02-20 | Discharge: 2021-02-22 | DRG: 871 | Disposition: A | Discharge status: Home / Self Care | Payer: Medicare Other | Attending: Internal Medicine | Admitting: Internal Medicine

## 2021-02-20 DIAGNOSIS — N400 Enlarged prostate without lower urinary tract symptoms: Secondary | ICD-10-CM | POA: Diagnosis present

## 2021-02-20 DIAGNOSIS — Z572 Occupational exposure to dust: Secondary | ICD-10-CM | POA: Diagnosis not present

## 2021-02-20 DIAGNOSIS — Z87891 Personal history of nicotine dependence: Secondary | ICD-10-CM

## 2021-02-20 DIAGNOSIS — R0789 Other chest pain: Secondary | ICD-10-CM | POA: Diagnosis not present

## 2021-02-20 DIAGNOSIS — Z79899 Other long term (current) drug therapy: Secondary | ICD-10-CM | POA: Diagnosis not present

## 2021-02-20 DIAGNOSIS — Z20822 Contact with and (suspected) exposure to covid-19: Secondary | ICD-10-CM | POA: Diagnosis present

## 2021-02-20 DIAGNOSIS — J101 Influenza due to other identified influenza virus with other respiratory manifestations: Secondary | ICD-10-CM | POA: Diagnosis not present

## 2021-02-20 DIAGNOSIS — Z8611 Personal history of tuberculosis: Secondary | ICD-10-CM | POA: Diagnosis not present

## 2021-02-20 DIAGNOSIS — E119 Type 2 diabetes mellitus without complications: Secondary | ICD-10-CM | POA: Diagnosis not present

## 2021-02-20 DIAGNOSIS — R079 Chest pain, unspecified: Secondary | ICD-10-CM | POA: Diagnosis not present

## 2021-02-20 DIAGNOSIS — A419 Sepsis, unspecified organism: Secondary | ICD-10-CM | POA: Diagnosis not present

## 2021-02-20 DIAGNOSIS — J9610 Chronic respiratory failure, unspecified whether with hypoxia or hypercapnia: Secondary | ICD-10-CM | POA: Diagnosis present

## 2021-02-20 DIAGNOSIS — J432 Centrilobular emphysema: Secondary | ICD-10-CM | POA: Diagnosis present

## 2021-02-20 DIAGNOSIS — R053 Chronic cough: Secondary | ICD-10-CM | POA: Diagnosis present

## 2021-02-20 DIAGNOSIS — I10 Essential (primary) hypertension: Secondary | ICD-10-CM | POA: Diagnosis present

## 2021-02-20 DIAGNOSIS — J441 Chronic obstructive pulmonary disease with (acute) exacerbation: Secondary | ICD-10-CM | POA: Diagnosis not present

## 2021-02-20 DIAGNOSIS — R54 Age-related physical debility: Secondary | ICD-10-CM | POA: Diagnosis present

## 2021-02-20 DIAGNOSIS — R059 Cough, unspecified: Secondary | ICD-10-CM | POA: Diagnosis not present

## 2021-02-20 DIAGNOSIS — J9621 Acute and chronic respiratory failure with hypoxia: Secondary | ICD-10-CM | POA: Diagnosis present

## 2021-02-20 DIAGNOSIS — R911 Solitary pulmonary nodule: Secondary | ICD-10-CM | POA: Diagnosis not present

## 2021-02-20 DIAGNOSIS — R6520 Severe sepsis without septic shock: Secondary | ICD-10-CM

## 2021-02-20 LAB — CBC WITH DIFFERENTIAL/PLATELET
Abs Immature Granulocytes: 0.02 10*3/uL (ref 0.00–0.07)
Basophils Absolute: 0 10*3/uL (ref 0.0–0.1)
Basophils Relative: 0 %
Eosinophils Absolute: 0 10*3/uL (ref 0.0–0.5)
Eosinophils Relative: 0 %
HCT: 41.8 % (ref 39.0–52.0)
Hemoglobin: 14 g/dL (ref 13.0–17.0)
Immature Granulocytes: 0 %
Lymphocytes Relative: 16 %
Lymphs Abs: 1.1 10*3/uL (ref 0.7–4.0)
MCH: 31.3 pg (ref 26.0–34.0)
MCHC: 33.5 g/dL (ref 30.0–36.0)
MCV: 93.5 fL (ref 80.0–100.0)
Monocytes Absolute: 0.8 10*3/uL (ref 0.1–1.0)
Monocytes Relative: 12 %
Neutro Abs: 5.1 10*3/uL (ref 1.7–7.7)
Neutrophils Relative %: 72 %
Platelets: 267 10*3/uL (ref 150–400)
RBC: 4.47 MIL/uL (ref 4.22–5.81)
RDW: 12.6 % (ref 11.5–15.5)
WBC: 7 10*3/uL (ref 4.0–10.5)
nRBC: 0 % (ref 0.0–0.2)

## 2021-02-20 LAB — COMPREHENSIVE METABOLIC PANEL
ALT: 26 U/L (ref 0–44)
AST: 31 U/L (ref 15–41)
Albumin: 3.8 g/dL (ref 3.5–5.0)
Alkaline Phosphatase: 67 U/L (ref 38–126)
Anion gap: 9 (ref 5–15)
BUN: 22 mg/dL (ref 8–23)
CO2: 25 mmol/L (ref 22–32)
Calcium: 8.7 mg/dL — ABNORMAL LOW (ref 8.9–10.3)
Chloride: 102 mmol/L (ref 98–111)
Creatinine, Ser: 0.93 mg/dL (ref 0.61–1.24)
GFR, Estimated: >60 mL/min (ref >60)
Glucose, Bld: 215 mg/dL — ABNORMAL HIGH (ref 70–99)
Potassium: 3.4 mmol/L — ABNORMAL LOW (ref 3.5–5.1)
Sodium: 136 mmol/L (ref 135–145)
Total Bilirubin: 0.7 mg/dL (ref 0.3–1.2)
Total Protein: 7.8 g/dL (ref 6.5–8.1)

## 2021-02-20 LAB — RESP PANEL BY RT-PCR (FLU A&B, COVID) ARPGX2
Influenza A by PCR: POSITIVE — AB
Influenza B by PCR: NEGATIVE
SARS Coronavirus 2 by RT PCR: NEGATIVE

## 2021-02-20 LAB — LACTIC ACID, PLASMA
Lactic Acid, Venous: 3.2 mmol/L (ref 0.5–1.9)
Lactic Acid, Venous: 1.3 mmol/L (ref 0.5–1.9)

## 2021-02-20 LAB — APTT: aPTT: 40 seconds — ABNORMAL HIGH (ref 24–36)

## 2021-02-20 LAB — TROPONIN I (HIGH SENSITIVITY): Troponin I (High Sensitivity): 11 ng/L (ref <18)

## 2021-02-20 LAB — PROTIME-INR
INR: 1 (ref 0.8–1.2)
Prothrombin Time: 13 seconds (ref 11.4–15.2)

## 2021-02-20 MED ORDER — ACETAMINOPHEN 500 MG PO TABS
1000.0000 mg | ORAL_TABLET | Freq: Once | ORAL | Status: AC
  Administered 2021-02-20: 1000 mg via ORAL
  Filled 2021-02-20: qty 2

## 2021-02-20 MED ORDER — ALBUTEROL SULFATE (2.5 MG/3ML) 0.083% IN NEBU: 2.5000 mg | INHALATION_SOLUTION | RESPIRATORY_TRACT | Status: DC | PRN

## 2021-02-20 MED ORDER — ACETAMINOPHEN 325 MG PO TABS: 650.0000 mg | ORAL_TABLET | Freq: Four times a day (QID) | ORAL | Status: DC | PRN

## 2021-02-20 MED ORDER — IPRATROPIUM-ALBUTEROL 0.5-2.5 (3) MG/3ML IN SOLN
3.0000 mL | Freq: Four times a day (QID) | RESPIRATORY_TRACT | Status: DC
  Administered 2021-02-21 – 2021-02-22 (×7): 3 mL via RESPIRATORY_TRACT
  Filled 2021-02-20 (×7): qty 3

## 2021-02-20 MED ORDER — VANCOMYCIN HCL IN DEXTROSE 1-5 GM/200ML-% IV SOLN
1000.0000 mg | Freq: Once | INTRAVENOUS | Status: AC
  Administered 2021-02-20: 1000 mg via INTRAVENOUS
  Filled 2021-02-20: qty 200

## 2021-02-20 MED ORDER — METHYLPREDNISOLONE SODIUM SUCC 125 MG IJ SOLR
125.0000 mg | Freq: Once | INTRAMUSCULAR | Status: AC
  Administered 2021-02-20: 125 mg via INTRAVENOUS
  Filled 2021-02-20: qty 2

## 2021-02-20 MED ORDER — SODIUM CHLORIDE 0.9 % IV SOLN: INTRAVENOUS | Status: AC

## 2021-02-20 MED ORDER — ONDANSETRON HCL 4 MG/2ML IJ SOLN: 4.0000 mg | Freq: Four times a day (QID) | INTRAMUSCULAR | Status: DC | PRN

## 2021-02-20 MED ORDER — PREDNISONE 20 MG PO TABS: 40.0000 mg | ORAL_TABLET | Freq: Every day | ORAL | Status: DC

## 2021-02-20 MED ORDER — ONDANSETRON HCL 4 MG PO TABS: 4.0000 mg | ORAL_TABLET | Freq: Four times a day (QID) | ORAL | Status: DC | PRN

## 2021-02-20 MED ORDER — ACETAMINOPHEN 325 MG RE SUPP: 650.0000 mg | Freq: Four times a day (QID) | RECTAL | Status: DC | PRN

## 2021-02-20 MED ORDER — HYDROCOD POLST-CPM POLST ER 10-8 MG/5ML PO SUER
5.0000 mL | Freq: Once | ORAL | Status: AC
  Administered 2021-02-20: 5 mL via ORAL
  Filled 2021-02-20: qty 5

## 2021-02-20 MED ORDER — SODIUM CHLORIDE 0.9 % IV SOLN
2.0000 g | Freq: Once | INTRAVENOUS | Status: AC
  Administered 2021-02-20: 2 g via INTRAVENOUS
  Filled 2021-02-20: qty 2

## 2021-02-20 MED ORDER — METRONIDAZOLE 500 MG/100ML IV SOLN
500.0000 mg | Freq: Once | INTRAVENOUS | Status: AC
  Administered 2021-02-20: 500 mg via INTRAVENOUS
  Filled 2021-02-20: qty 100

## 2021-02-20 MED ORDER — ENOXAPARIN SODIUM 40 MG/0.4ML IJ SOSY
40.0000 mg | PREFILLED_SYRINGE | INTRAMUSCULAR | Status: DC
  Administered 2021-02-21 (×2): 40 mg via SUBCUTANEOUS
  Filled 2021-02-20 (×2): qty 0.4

## 2021-02-20 MED ORDER — BENZONATATE 100 MG PO CAPS: 200.0000 mg | ORAL_CAPSULE | Freq: Three times a day (TID) | ORAL | Status: DC | PRN

## 2021-02-20 MED ORDER — LACTATED RINGERS IV BOLUS
1000.0000 mL | Freq: Once | INTRAVENOUS | Status: AC
  Administered 2021-02-20: 1000 mL via INTRAVENOUS

## 2021-02-20 MED ORDER — METHYLPREDNISOLONE SODIUM SUCC 40 MG IJ SOLR
40.0000 mg | Freq: Two times a day (BID) | INTRAMUSCULAR | Status: AC
  Administered 2021-02-21 (×2): 40 mg via INTRAVENOUS
  Filled 2021-02-20 (×2): qty 1

## 2021-02-20 MED ORDER — OSELTAMIVIR PHOSPHATE 30 MG PO CAPS
30.0000 mg | ORAL_CAPSULE | Freq: Two times a day (BID) | ORAL | Status: DC
  Administered 2021-02-21 – 2021-02-22 (×3): 30 mg via ORAL
  Filled 2021-02-20 (×4): qty 1

## 2021-02-20 MED ORDER — SODIUM CHLORIDE 0.9 % IV BOLUS (SEPSIS)
1000.0000 mL | Freq: Once | INTRAVENOUS | Status: AC
  Administered 2021-02-20: 1000 mL via INTRAVENOUS

## 2021-02-20 MED ORDER — INSULIN ASPART 100 UNIT/ML IJ SOLN
0.0000 [IU] | Freq: Every day | INTRAMUSCULAR | Status: DC
  Administered 2021-02-21: 23:00:00 3 [IU] via SUBCUTANEOUS
  Filled 2021-02-20: qty 1

## 2021-02-20 MED ORDER — INSULIN ASPART 100 UNIT/ML IJ SOLN
0.0000 [IU] | Freq: Three times a day (TID) | INTRAMUSCULAR | Status: DC
  Administered 2021-02-21 (×2): 3 [IU] via SUBCUTANEOUS
  Administered 2021-02-21: 13:00:00 11 [IU] via SUBCUTANEOUS
  Administered 2021-02-22: 09:00:00 3 [IU] via SUBCUTANEOUS
  Filled 2021-02-20 (×4): qty 1

## 2021-02-20 MED ORDER — OSELTAMIVIR PHOSPHATE 75 MG PO CAPS
75.0000 mg | ORAL_CAPSULE | Freq: Once | ORAL | Status: AC
  Administered 2021-02-20: 75 mg via ORAL
  Filled 2021-02-20 (×2): qty 1

## 2021-02-20 MED ORDER — MORPHINE SULFATE (PF) 4 MG/ML IV SOLN
4.0000 mg | Freq: Once | INTRAVENOUS | Status: AC
  Administered 2021-02-20: 4 mg via INTRAVENOUS
  Filled 2021-02-20: qty 1

## 2021-02-20 MED ORDER — ONDANSETRON HCL 4 MG/2ML IJ SOLN
4.0000 mg | Freq: Once | INTRAMUSCULAR | Status: AC
  Administered 2021-02-20: 4 mg via INTRAVENOUS
  Filled 2021-02-20: qty 2

## 2021-02-20 NOTE — ED Triage Notes (Addendum)
Pt with chest pain that began a week ago per family. Pt with cough noted. Per family pt has been short of breath at times, pt is speaking in full sentences but appears in distress. Skin hot and dry to touch. Family with pt states pt has been taking prednisone for same. Noted tachypnea, tachycardia.

## 2021-02-20 NOTE — ED Provider Notes (Signed)
[facility] Emergency Department Provider Note  Time seen: 8:22 PM  I have reviewed the triage vital signs and the nursing notes.   HISTORY  Chief Complaint Chest Pain   HPI Jonathan Willis is a 75 y.o. male with a past medical history of diabetes, hypertension, COPD on 2 L of oxygen, presents to the emergency department for 1 week of cough congestion headaches shortness of breath and fever.  According to the daughter who is translating for the patient for the past 1 week patient has been coughing significantly has been nauseated with  headaches.  Upon arrival patient does have an occasional cough, is febrile to 103.3, tachycardic and tachypneic.  Room air saturation around 90% placed on 2 L of oxygen which is his baseline patient currently satting 98 to 99%.  Does state moderate central chest pain as well which he relates to the cough.  Was prescribed prednisone by his doctor without relief.   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   History of TB (tuberculosis)    Hypertension    Lower GI bleed     Patient Active Problem List   Diagnosis Date Noted   Aortic atherosclerosis (HCC) 09/22/2020   Carotid stenosis 07/01/2017   Benign prostatic hyperplasia with incomplete bladder emptying 03/23/2017   Chronic respiratory failure (HCC) 01/21/2017   Chronic right shoulder pain 01/16/2017   Essential hypertension 01/16/2017   Type 2 diabetes mellitus with other specified complication (HCC) 01/16/2017   Dyslipidemia associated with type 2 diabetes mellitus (HCC) 01/16/2017   Centrilobular emphysema (HCC) 01/16/2017    Past Surgical History:  Procedure Laterality Date   COLONOSCOPY WITH PROPOFOL N/A 04/10/2017   Procedure: COLONOSCOPY WITH PROPOFOL;  Surgeon: Wyline Mood, MD;   Location: [facility] ENDOSCOPY;  Service: Gastroenterology;  Laterality: N/A;   NO PAST SURGERIES      Prior to Admission medications   Medication Sig Start Date End Date Taking? Authorizing Provider  Accu-Chek Softclix Lancets lancets USE TO TEST ONCE DAILY 09/22/20   Lorre Munroe, NP  albuterol (VENTOLIN HFA) 108 (90 Base) MCG/ACT inhaler Inhale 2 puffs into the lungs every 6 (six) hours as needed for wheezing or shortness of breath. 11/11/20   Lorre Munroe, NP  benzonatate (TESSALON PERLES) 100 MG capsule Take 1-2 tabs TID prn cough 09/10/20   Menshew, Charlesetta Ivory, PA-C  Blood Glucose Monitoring Suppl (ACCU-CHEK AVIVA CONNECT) w/Device KIT 1 Device by Does not apply route daily. Dx E11.4 09/22/20   Lorre Munroe, NP  Fluticasone-Umeclidin-Vilant (TRELEGY ELLIPTA) 100-62.5-25 MCG/INH AEPB Inhale 1 puff into the lungs daily. 04/16/20   Malfi, Jodelle Gross, FNP  glucose blood (ACCU-CHEK AVIVA PLUS) test strip USE 1 TEST STRIP TO TEST BLOOD SUGAR DAILY 09/22/20   Lorre Munroe, NP  mirabegron ER (MYRBETRIQ) 25 MG TB24 tablet Take 1 tablet (25 mg total) by mouth daily. 11/04/20   Carman Ching, PA-C  Spacer/Aero-Hold Chamber Bags MISC 1 Device by Does not apply route every 6 (six) hours as needed (with inhaler). 01/10/17   Galen Manila, NP  tamsulosin (FLOMAX) 0.4 MG CAPS capsule TAKE 2 CAPSULES BY MOUTH EVERY DAY 04/06/20   Malfi, Jodelle Gross, FNP  telmisartan (MICARDIS) 40 MG tablet Take 1 tablet (40 mg total) by mouth daily. 09/01/20  Jearld Fenton, NP    No Known Allergies  Family History  Problem Relation Age of Onset   COPD Neg Hx    Diabetes Mellitus II Neg Hx    Hypertension Neg Hx     Social History Social History   Tobacco Use   Smoking status: Former    Packs/day: 1.50    Years: 44.00    Pack years: 66.00    Types: Cigarettes    Quit date: 01/11/2007    Years since quitting: 14.1   Smokeless tobacco: Former  Scientific laboratory technician Use: Never used  Substance  Use Topics   Alcohol use: No   Drug use: No    Review of Systems Constitutional: Positive for fever ENT: Mild congestion Cardiovascular: Moderate central chest pain worse with cough. Respiratory: Positive shortness of breath.  Positive for cough x1 week Gastrointestinal: Negative for abdominal pain.  Positive for intermittent nausea Genitourinary: Negative for urinary compaints Musculoskeletal: Negative for musculoskeletal complaints Neurological: Moderate headaches All other ROS negative  ____________________________________________   PHYSICAL EXAM:  VITAL SIGNS: ED Triage Vitals  Enc Vitals Group     BP 02/20/21 1959 (!) 162/86     Pulse Rate 02/20/21 1959 (!) 132     Resp 02/20/21 1959 (!) 40     Temp 02/20/21 2000 (!) 103.3 F (39.6 C)     Temp Source 02/20/21 2000 Oral     SpO2 02/20/21 1959 90 %     Weight --      Height --      Head Circumference --      Peak Flow --      Pain Score --      Pain Loc --      Pain Edu? --      Excl. in [place]? --    Constitutional: Alert and oriented.  No acute distress although tachypneic breathing around 40 breaths/min. Eyes: Normal exam ENT      Head: Normocephalic and atraumatic.      Mouth/Throat: Mucous membranes are moist. Cardiovascular: Regular rhythm rate around 120 bpm. Respiratory: Moderate tachypnea, slight rhonchi on exam bilaterally.  Good air movement. Gastrointestinal: Soft and nontender. No distention.  Musculoskeletal: Nontender with normal range of motion in all extremities. Neurologic:  Normal speech and language. No gross focal neurologic deficits Skin: Skin is hot to the touch. Psychiatric: Mood and affect are normal.   ____________________________________________    EKG  EKG viewed and interpreted by myself shows sinus tachycardia 121 bpm with a narrow QRS, normal axis, normal intervals, no concerning ST changes.  ____________________________________________    RADIOLOGY  IMPRESSION:  1.  Stable chronic right upper lobe scarring and right-sided volume  loss.  2. No acute or active cardiopulmonary disease.   ____________________________________________   INITIAL IMPRESSION / ASSESSMENT AND PLAN / ED COURSE  Pertinent labs & imaging results that were available during my care of the patient were reviewed by me and considered in my medical decision making (see chart for details).   Patient presents emergency department for cough congestion shortness of breath x1 week.  Patient found to be febrile to 103 tachycardic and tachypneic meeting sepsis criteria.  We will check labs, cultures, start the patient on broad-spectrum antibiotics given his age and comorbidities.  Differential is quite broad at this time but would include pneumonia, influenza, COVID-19, COPD exacerbation due to or other infectious etiology.  We will continue to closely monitor the patient while awaiting lab and x-ray results.  Chest x-ray negative.  Labs are largely within normal limits.  Patient has tested positive for influenza A.  Suspect given the patient's underlying COPD the influenza has exacerbated COPD causing the mild wheeze, dyspnea, tachypnea.  We will start the patient on Tamiflu.  Given the patient's shortness of breath tachypnea and likely COPD exacerbation due to influenza we will admit to the hospitalist service started on Solu-Medrol in addition to Tamiflu.  Patient agreeable to plan of care.  Jonathan Willis was evaluated in Emergency Department on 02/20/2021 for the symptoms described in the history of present illness. He was evaluated in the context of the global COVID-19 pandemic, which necessitated consideration that the patient might be at risk for infection with the SARS-CoV-2 virus that causes COVID-19. Institutional protocols and algorithms that pertain to the evaluation of patients at risk for COVID-19 are in a state of rapid change based on information released by regulatory bodies including the CDC  and federal and state organizations. These policies and algorithms were followed during the patient's care in the ED.  CRITICAL CARE Performed by: Harvest Dark   Total critical care time: 30 minutes  Critical care time was exclusive of separately billable procedures and treating other patients.  Critical care was necessary to treat or prevent imminent or life-threatening deterioration.  Critical care was time spent personally by me on the following activities: development of treatment plan with patient and/or surrogate as well as nursing, discussions with consultants, evaluation of patient's response to treatment, examination of patient, obtaining history from patient or surrogate, ordering and performing treatments and interventions, ordering and review of laboratory studies, ordering and review of radiographic studies, pulse oximetry and re-evaluation of patient's condition.  ____________________________________________   FINAL CLINICAL IMPRESSION(S) / ED DIAGNOSES  Sepsis Dyspnea Chest pain Influenza A COPD exacerbation   Harvest Dark, MD 02/20/21 2148

## 2021-02-20 NOTE — Progress Notes (Signed)
CODE SEPSIS - PHARMACY COMMUNICATION  **Broad Spectrum Antibiotics should be administered within 1 hour of Sepsis diagnosis**  Time Code Sepsis Called/Page Received: 2022  Antibiotics Ordered: vancomycin, cefepime, metronidazole  Time of 1st antibiotic administration: 2031   Sherilyn Banker ,PharmD Clinical Pharmacist  02/20/2021  8:25 PM

## 2021-02-20 NOTE — ED Notes (Signed)
Daughter at bedside.

## 2021-02-20 NOTE — Sepsis Progress Note (Addendum)
Elink following for Sepsis Protocol, Blood culture time as well as Abx timing noted

## 2021-02-20 NOTE — ED Notes (Signed)
Critical lactic of 3.2. Provider notified.

## 2021-02-20 NOTE — Progress Notes (Signed)
PHARMACY -  BRIEF ANTIBIOTIC NOTE   Pharmacy has received consult(s) for vancomycin and cefepime from an ED provider.  The patient's profile has been reviewed for ht/wt/allergies/indication/available labs.    One time order(s) placed for: Cefepime 2 g IV Vancomycin 1 g IV  Further antibiotics/pharmacy consults should be ordered by admitting physician if indicated.                       Thank you, Forde Dandy Layton Naves 02/20/2021  8:25 PM

## 2021-02-20 NOTE — H&P (Signed)
History and Physical    Jonathan Willis DSK:[id number] DOB: June 14, 1945 DOA: 02/20/2021  PCP: Jonathan Fenton, NP   Patient coming from: home  I have personally briefly reviewed patient's relevant medical records in [place]  Chief Complaint: shortness of breath  HPI: Jonathan Willis is a 75 y.o. male with medical history significant for COPD and chronic respiratory failure on nighttime and as needed O2, followed by pulmonology, DM, HTN, BPH who presents to the ED with weakness, shortness of breath, needing to wear oxygen round-the-clock for the past couple days.  Also has a headache and intermittent nonbloody nonbilious vomiting.  Has had no fever or chills and denies chest pain, diarrhea or abdominal pain.  ED course: T-max 103.3, tachycardic to 132, tachypneic to 40 with BP 190/90 on arrival, 128/63 at admission.  O2 sats 90% on home flow rate of 2 L Labs mostly unremarkable Flu A+  EKG, personally viewed and interpreted: Sinus tachycardia at 121 with no acute ST-T wave changes  Chest x-ray with stable chronic right upper lobe scarring and no acute or active cardiopulmonary disease  Patient treated with Tamiflu as well as sepsis fluid bolus, sepsis antibiotics with cefepime Flagyl and vancomycin and also given a dose of Solu-Medrol for wheezing.  Hospitalist consulted for admission.    Review of Systems: As per HPI otherwise all other systems on review of systems negative. 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 His smoking use included cigarettes. He has a 66.00 pack-year smoking history. He has quit using smokeless tobacco. He reports that he does not drink alcohol and does not use drugs.  No Known Allergies  Family History  Problem Relation Age of Onset   COPD Neg Hx    Diabetes Mellitus II Neg Hx    Hypertension Neg Hx       Prior to Admission medications   Medication Sig Start Date End Date Taking? Authorizing Provider  Accu-Chek Softclix Lancets lancets USE TO TEST ONCE DAILY 09/22/20   Jonathan Fenton, NP  albuterol (VENTOLIN HFA) 108 (90 Base) MCG/ACT inhaler Inhale 2 puffs into the lungs every 6 (six) hours as needed for wheezing or shortness of breath. 11/11/20   Jonathan Fenton, NP  benzonatate (TESSALON PERLES) 100 MG capsule Take 1-2 tabs TID prn cough 09/10/20   Menshew, Jonathan Karvonen, PA-C  Blood Glucose Monitoring Suppl (ACCU-CHEK AVIVA CONNECT) w/Device KIT 1 Device by Does not apply route daily. Dx E11.4 09/22/20   Jonathan Fenton, NP  Fluticasone-Umeclidin-Vilant (TRELEGY ELLIPTA) 100-62.5-25 MCG/INH AEPB Inhale 1 puff into the lungs daily. 04/16/20   Malfi, Jonathan Raider, FNP  glucose blood (ACCU-CHEK AVIVA PLUS) test strip USE 1 TEST STRIP TO TEST BLOOD SUGAR DAILY 09/22/20   Jonathan Fenton, NP  mirabegron ER (MYRBETRIQ) 25 MG TB24 tablet Take 1 tablet (  25 mg total) by mouth daily. 11/04/20   Jonathan Loop, PA-C  Spacer/Aero-Hold Chamber Bags MISC 1 Device by Does not apply route every 6 (six) hours as needed (with inhaler). 01/10/17   Jonathan College, NP  tamsulosin (FLOMAX) 0.4 MG CAPS capsule TAKE 2 CAPSULES BY MOUTH EVERY DAY 04/06/20   Malfi, Jonathan Raider, FNP  telmisartan (MICARDIS) 40 MG tablet Take 1 tablet (40 mg total) by mouth daily. 09/01/20   Jonathan Fenton, NP    Physical Exam: Vitals:   02/20/21 2052 02/20/21 2128 02/20/21 2200  02/20/21 2208  BP:  (!) 143/74 128/63   Pulse:  (!) 110 (!) 106   Resp:  (!) 36 (!) 26   Temp:    100.2 F (37.9 C)  TempSrc:    Oral  SpO2:  95% 97%   Weight: 55.3 kg     Height: $Remove'5\' 1"'tcKDbvc$  (1.549 m)      Constitutional: Frail .oriented x 3 .  Coughing a lot.  Conversational dyspnea mostly related to cough  HEENT:      Head: Normocephalic and atraumatic.         Eyes: PERLA, EOMI, Conjunctivae are normal. Sclera is non-icteric.       Mouth/Throat: Mucous membranes are moist.       Neck: Supple with no signs of meningismus. Cardiovascular: Tachycardic. No murmurs, gallops, or rubs. 2+ symmetrical distal pulses are present . No JVD. No  LE edema Respiratory: Respiratory effort increased.  Tachypneic.scattered  gastrointestinal: Soft, non tender, non distended. Positive bowel sounds.  Genitourinary: No CVA tenderness. Musculoskeletal: Nontender with normal range of motion in all extremities. No cyanosis, or erythema of extremities. Neurologic:  Face is symmetric. Moving all extremities. No gross focal neurologic deficits . Skin: Skin is warm, dry.  No rash or ulcers Psychiatric: Mood and affect are appropriate    Labs on Admission: I have personally reviewed following labs and imaging studies  CBC: Recent Labs  Lab 02/20/21 2009  WBC 7.0  NEUTROABS 5.1  HGB 14.0  HCT 41.8  MCV 93.5  PLT 361   Basic Metabolic Panel: Recent Labs  Lab 02/20/21 2009  NA 136  K 3.4*  CL 102  CO2 25  GLUCOSE 215*  BUN 22  CREATININE 0.93  CALCIUM 8.7*   GFR: Estimated Creatinine Clearance: 49.2 mL/min (by C-G formula based on SCr of 0.93 mg/dL). Liver Function Tests: Recent Labs  Lab 02/20/21 2009  AST 31  ALT 26  ALKPHOS 67  BILITOT 0.7  PROT 7.8  ALBUMIN 3.8   No results for input(s): LIPASE, AMYLASE in the last 168 hours. No results for input(s): AMMONIA in the last 168 hours. Coagulation Profile: Recent Labs  Lab 02/20/21 2009  INR 1.0   Cardiac Enzymes: No results  for input(s): CKTOTAL, CKMB, CKMBINDEX, TROPONINI in the last 168 hours. BNP (last 3 results) No results for input(s): PROBNP in the last 8760 hours. HbA1C: No results for input(s): HGBA1C in the last 72 hours. CBG: No results for input(s): GLUCAP in the last 168 hours. Lipid Profile: No results for input(s): CHOL, HDL, LDLCALC, TRIG, CHOLHDL, LDLDIRECT in the last 72 hours. Thyroid Function Tests: No results for input(s): TSH, T4TOTAL, FREET4, T3FREE, THYROIDAB in the last 72 hours. Anemia Panel: No results for input(s): VITAMINB12, FOLATE, FERRITIN, TIBC, IRON, RETICCTPCT in the last 72 hours. Urine analysis:    Component Value Date/Time   COLORURINE STRAW (A) 07/20/2020 1010   APPEARANCEUR Clear 11/04/2020 1355   LABSPEC  1.006 07/20/2020 1010   LABSPEC 1.023 02/01/2014 2216   PHURINE 6.0 07/20/2020 1010   GLUCOSEU Negative 11/04/2020 1355   GLUCOSEU 50 mg/dL 02/01/2014 2216   HGBUR NEGATIVE 07/20/2020 1010   BILIRUBINUR Negative 11/04/2020 1355   BILIRUBINUR Negative 02/01/2014 2216   KETONESUR NEGATIVE 07/20/2020 1010   PROTEINUR Negative 11/04/2020 1355   PROTEINUR NEGATIVE 07/20/2020 1010   UROBILINOGEN 0.2 07/10/2019 0845   NITRITE Negative 11/04/2020 1355   NITRITE NEGATIVE 07/20/2020 1010   LEUKOCYTESUR Negative 11/04/2020 1355   LEUKOCYTESUR NEGATIVE 07/20/2020 1010   LEUKOCYTESUR Negative 02/01/2014 2216    Radiological Exams on Admission: DG Chest 1 View  Result Date: 02/20/2021 CLINICAL DATA:  Chest pain and cough. EXAM: CHEST  1 VIEW COMPARISON:  September 10, 2020 FINDINGS: Low lung volumes are again seen on the right. Chronic right upper lobe scarring is present. A stable calcified right upper lobe lung nodule is seen. There is no evidence of acute infiltrate, pleural effusion or pneumothorax. Left right shift of the superior mediastinal structures is noted. The heart size and mediastinal contours are otherwise within normal limits. The visualized skeletal  structures are unremarkable. IMPRESSION: 1. Stable chronic right upper lobe scarring and right-sided volume loss. 2. No acute or active cardiopulmonary disease. Electronically Signed   By: Virgina Norfolk M.D.   On: 02/20/2021 20:42    Assessment/Plan    Influenza A   Sepsis ([place]) -Tamiflu -continue sepsis fluids - Supportive care, antitussives, decongestants    Essential hypertension - Continue telmisartan    COPD with possible exacerbation (HCC)   Chronic respiratory failure (HCC) - We will continue Solu-Medrol - Scheduled and as needed bronchodilators - Supplemental oxygen    BPH (benign prostatic hyperplasia) - Continue tamsulosin    Diabetes mellitus without complication (HCC) - Sliding scale insulin    DVT prophylaxis: Lovenox  Code Status: full code  Family Communication:  none  Disposition Plan: Back to previous home environment Consults called: none  Status:At the time of admission, it appears that the appropriate admission status for this patient is INPATIENT. This is judged to be reasonable and necessary in order to provide the required intensity of service to ensure the patient's safety given the presenting symptoms, physical exam findings, and initial radiographic and laboratory data in the context of their  Comorbid conditions.   Patient requires inpatient status due to high intensity of service, high risk for further deterioration and high frequency of surveillance required.   I certify that at the point of admission it is my clinical judgment that the patient will require inpatient hospital care spanning beyond [place] MD [facility]   02/20/2021, 10:29 PM

## 2021-02-21 ENCOUNTER — Other Ambulatory Visit: Payer: Self-pay

## 2021-02-21 DIAGNOSIS — J101 Influenza due to other identified influenza virus with other respiratory manifestations: Secondary | ICD-10-CM | POA: Diagnosis not present

## 2021-02-21 LAB — URINALYSIS, ROUTINE W REFLEX MICROSCOPIC
Bacteria, UA: NONE SEEN
Bilirubin Urine: NEGATIVE
Glucose, UA: >1000 mg/dL — AB
Hgb urine dipstick: NEGATIVE
Ketones, ur: NEGATIVE mg/dL
Leukocytes,Ua: NEGATIVE
Nitrite: NEGATIVE
Protein, ur: NEGATIVE mg/dL
Specific Gravity, Urine: 1.02 (ref 1.005–1.030)
pH: 6.5 (ref 5.0–8.0)

## 2021-02-21 LAB — CBC WITH DIFFERENTIAL/PLATELET
Abs Immature Granulocytes: 0.05 10*3/uL (ref 0.00–0.07)
Basophils Absolute: 0 10*3/uL (ref 0.0–0.1)
Basophils Relative: 0 %
Eosinophils Absolute: 0 10*3/uL (ref 0.0–0.5)
Eosinophils Relative: 0 %
HCT: 37.5 % — ABNORMAL LOW (ref 39.0–52.0)
Hemoglobin: 12.5 g/dL — ABNORMAL LOW (ref 13.0–17.0)
Immature Granulocytes: 1 %
Lymphocytes Relative: 7 %
Lymphs Abs: 0.5 10*3/uL — ABNORMAL LOW (ref 0.7–4.0)
MCH: 32.1 pg (ref 26.0–34.0)
MCHC: 33.3 g/dL (ref 30.0–36.0)
MCV: 96.2 fL (ref 80.0–100.0)
Monocytes Absolute: 0.2 10*3/uL (ref 0.1–1.0)
Monocytes Relative: 3 %
Neutro Abs: 6.4 10*3/uL (ref 1.7–7.7)
Neutrophils Relative %: 89 %
Platelets: 222 10*3/uL (ref 150–400)
RBC: 3.9 MIL/uL — ABNORMAL LOW (ref 4.22–5.81)
RDW: 12.7 % (ref 11.5–15.5)
WBC: 7.1 10*3/uL (ref 4.0–10.5)
nRBC: 0 % (ref 0.0–0.2)

## 2021-02-21 LAB — CBG MONITORING, ED
Glucose-Capillary: 114 mg/dL — ABNORMAL HIGH (ref 70–99)
Glucose-Capillary: 163 mg/dL — ABNORMAL HIGH (ref 70–99)
Glucose-Capillary: 198 mg/dL — ABNORMAL HIGH (ref 70–99)
Glucose-Capillary: 279 mg/dL — ABNORMAL HIGH (ref 70–99)
Glucose-Capillary: 314 mg/dL — ABNORMAL HIGH (ref 70–99)

## 2021-02-21 LAB — COMPREHENSIVE METABOLIC PANEL
ALT: 20 U/L (ref 0–44)
AST: 24 U/L (ref 15–41)
Albumin: 2.9 g/dL — ABNORMAL LOW (ref 3.5–5.0)
Alkaline Phosphatase: 52 U/L (ref 38–126)
Anion gap: 4 — ABNORMAL LOW (ref 5–15)
BUN: 20 mg/dL (ref 8–23)
CO2: 27 mmol/L (ref 22–32)
Calcium: 7.9 mg/dL — ABNORMAL LOW (ref 8.9–10.3)
Chloride: 107 mmol/L (ref 98–111)
Creatinine, Ser: 0.98 mg/dL (ref 0.61–1.24)
GFR, Estimated: >60 mL/min (ref >60)
Glucose, Bld: 206 mg/dL — ABNORMAL HIGH (ref 70–99)
Potassium: 4.2 mmol/L (ref 3.5–5.1)
Sodium: 138 mmol/L (ref 135–145)
Total Bilirubin: 0.5 mg/dL (ref 0.3–1.2)
Total Protein: 6.3 g/dL — ABNORMAL LOW (ref 6.5–8.1)

## 2021-02-21 LAB — TROPONIN I (HIGH SENSITIVITY)
Troponin I (High Sensitivity): 14 ng/L (ref <18)
Troponin I (High Sensitivity): 12 ng/L (ref <18)

## 2021-02-21 LAB — CORTISOL-AM, BLOOD: Cortisol - AM: 4.5 ug/dL — ABNORMAL LOW (ref 6.7–22.6)

## 2021-02-21 LAB — PROCALCITONIN: Procalcitonin: <0.1 ng/mL

## 2021-02-21 LAB — HIV ANTIBODY (ROUTINE TESTING W REFLEX): HIV Screen 4th Generation wRfx: NONREACTIVE

## 2021-02-21 NOTE — ED Notes (Signed)
Bg 163

## 2021-02-21 NOTE — ED Notes (Signed)
Pt resting with eyes closed, respirations even and unlabored.

## 2021-02-21 NOTE — ED Notes (Addendum)
[id number], will wait for lunch tray to arrive before giving insulin coverage due to recent coverage given at 1041

## 2021-02-21 NOTE — ED Notes (Signed)
Pt given meal tray.

## 2021-02-21 NOTE — Evaluation (Signed)
Physical Therapy Evaluation Patient Details Name: Jonathan Willis MRN: [id number] DOB: [birth year] Today's Date: 02/21/2021  History of Present Illness  Jonathan Willis is a 75 y.o. male with medical history significant for COPD and chronic respiratory failure on nighttime and as needed O2, followed by pulmonology, DM, HTN, BPH who presents to the ED with weakness, shortness of breath, needing to wear oxygen round-the-clock for the past couple days.  Also has a headache and intermittent nonbloody nonbilious vomiting.  Pt found to have influenza A  Clinical Impression  The pt presents d/t influenza A and COPD exacerbation. At this time he is requiring increased use of oxygen that he reports is not his baseline. The pt demonstrates good balance and mobility this session with no increased need for assistance. He would benefit from one more PT session for stair negotiation training. With his increased need for O2 and need to maneuver 14 steps to get to/from his bedroom skilled PT is warranted to determine if it is safe for him to return to/from his bedroom with no assistance and his family is away most of the day for work. Recommend home with PRN assistance.     Recommendations for follow up therapy are one component of a multi-disciplinary discharge planning process, led by the attending physician.  Recommendations may be updated based on patient status, additional functional criteria and insurance authorization.  Follow Up Recommendations No PT follow up    Assistance Recommended at Discharge PRN  Functional Status Assessment Patient has had a recent decline in their functional status and demonstrates the ability to make significant improvements in function in a reasonable and predictable amount of time.  Equipment Recommendations  None recommended by PT    Recommendations for Other Services       Precautions / Restrictions Precautions Precautions: None Restrictions Weight Bearing Restrictions: No       Mobility  Bed Mobility Overal bed mobility: Independent             General bed mobility comments: Able to perform sit<>stand transfers with ease    Transfers Overall transfer level: Independent Equipment used: None Transfers: Sit to/from Stand Sit to Stand: Independent           General transfer comment: dizziness reported once achieving EOB, cleared with prolonged break at EOB.    Ambulation/Gait Ambulation/Gait assistance: Modified independent (Device/Increase time) Gait Distance (Feet): 300 Feet Assistive device: None Gait Pattern/deviations: WFL(Within Functional Limits)          Stairs            Wheelchair Mobility    Modified Rankin (Stroke Patients Only)       Balance Overall balance assessment: Modified Independent                                           Pertinent Vitals/Pain Pain Assessment: No/denies pain Faces Pain Scale: Hurts a little bit Pain Location: chest & neck Pain Descriptors / Indicators: Aching Pain Intervention(s): Limited activity within patient's tolerance;Monitored during session    Home Living Family/patient expects to be discharged to:: Private residence Living Arrangements: Children Available Help at Discharge: Family;Available PRN/intermittently Type of Home: House Home Access: Level entry     Alternate Level Stairs-Number of Steps: flight -14 Home Layout: Multi-level;Other (Comment);Able to live on main level with bedroom/bathroom (bedroom is downstairs in the basement) Home Equipment: None Additional Comments: Pt reports  use of bamboo walking stick when out in yard.    Prior Function Prior Level of Function : Independent/Modified Independent             Mobility Comments: Pt reports being independent without AD for functional mobility. Denies falls within past 12 months ADLs Comments: Pt reports being independent with ADLs and cooking. Pt does not drive     Hand Dominance    Dominant Hand: Right    Extremity/Trunk Assessment   Upper Extremity Assessment Upper Extremity Assessment: Overall WFL for tasks assessed    Lower Extremity Assessment Lower Extremity Assessment: Overall WFL for tasks assessed    Cervical / Trunk Assessment Cervical / Trunk Assessment: Normal  Communication   Communication: Interpreter utilized;Prefers language other than English (interpreter ID: [id number])  Cognition Arousal/Alertness: Awake/alert Behavior During Therapy: WFL for tasks assessed/performed Overall Cognitive Status: Within Functional Limits for tasks assessed                                          General Comments General comments (skin integrity, edema, etc.): While on RA, SpO2 99-100% throughout standing ADLs and functional mobility. RN informed    Exercises Other Exercises Other Exercises: Pt donning and doffing shoes independently. Also, independently performing personal hygiene.   Assessment/Plan    PT Assessment Patient needs continued PT services  PT Problem List Decreased activity tolerance       PT Treatment Interventions Stair training    PT Goals (Current goals can be found in the Care Plan section)  Acute Rehab PT Goals Patient Stated Goal: return home PT Goal Formulation: With patient Time For Goal Achievement: 03/07/21 Potential to Achieve Goals: Good    Frequency 7X/week   Barriers to discharge        Co-evaluation               AM-PAC PT "6 Clicks" Mobility  Outcome Measure Help needed turning from your back to your side while in a flat bed without using bedrails?: None Help needed moving from lying on your back to sitting on the side of a flat bed without using bedrails?: None Help needed moving to and from a bed to a chair (including a wheelchair)?: None Help needed standing up from a chair using your arms (e.g., wheelchair or bedside chair)?: None Help needed to walk in hospital room?: A Little Help  needed climbing 3-5 steps with a railing? : A Little 6 Click Score: 22    End of Session Equipment Utilized During Treatment: Oxygen Activity Tolerance: Patient tolerated treatment well Patient left: in bed;with call bell/phone within reach Nurse Communication: Mobility status PT Visit Diagnosis: Difficulty in walking, not elsewhere classified (R26.2)    Time: 3212-2482 PT Time Calculation (min) (ACUTE ONLY): 45 min   Charges:   PT Evaluation $PT Eval Low Complexity: 1 Low PT Treatments $Gait Training: 23-37 mins        12:15 PM, 02/21/21 Larosa Rhines A. Saverio Danker PT, DPT Physical Therapist - [facility]   Perry Molla A Tyri Elmore 02/21/2021, 12:11 PM

## 2021-02-21 NOTE — Evaluation (Signed)
Occupational Therapy Evaluation Patient Details Name: Jonathan Willis MRN: [id number] DOB: 1945/07/25 Today's Date: 02/21/2021   History of Present Illness Jonathan Willis is a 75 y.o. male with medical history significant for COPD and chronic respiratory failure on nighttime and as needed O2, followed by pulmonology, DM, HTN, BPH who presents to the ED with weakness, shortness of breath, needing to wear oxygen round-the-clock for the past couple days.  Also has a headache and intermittent nonbloody nonbilious vomiting.  Pt found to have influenza A   Clinical Impression   Pt seen for OT evaluation this date. [place] interpreter (via phone) utilized t/o conversation. Prior to hospital admission, pt was independent in all aspects of ADLs and functional mobility. Pt cooks and lives in a multi-story home (with bedroom located in basement) with children. Pt denies falls history in past 12 months. Upon arrival to room, pt on 2L/min of supplemental O2 however trailed on RA throughout session, with SpO2 99-100% throughout; [place] replaced at end of session per pt request and RN informed. Pt currently performs bed mobility, seated LB dressing, functional mobility of short household distances (72ft), toilet transfers, and standing hand hygiene MOD-I (requiring increase time/effort only). Pt educated on energy conservation strategies (e.g., activity pacing and PLB) with pt verbalizing understanding. No additional skilled OT needs identified at this time. Will sign off. Please re-consult if additional OT needs arise.      Recommendations for follow up therapy are one component of a multi-disciplinary discharge planning process, led by the attending physician.  Recommendations may be updated based on patient status, additional functional criteria and insurance authorization.   Follow Up Recommendations  No OT follow up    Assistance Recommended at Discharge PRN  Functional Status Assessment  Patient has had a recent  decline in their functional status and demonstrates the ability to make significant improvements in function in a reasonable and predictable amount of time.  Equipment Recommendations  None recommended by OT       Precautions / Restrictions Precautions Precautions: None Restrictions Weight Bearing Restrictions: No      Mobility Bed Mobility Overal bed mobility: Independent             General bed mobility comments: Able to perform sit<>stand transfers with ease    Transfers Overall transfer level: Needs assistance Equipment used: None Transfers: Sit to/from Stand Sit to Stand: Supervision           General transfer comment: SUPERVISION from bed d/t pt reporting dizziness with initial upright stance (pt reporting dizziness subsiding quickly). MOD-I (increase time only) from toilet      Balance Overall balance assessment: Modified Independent                                         ADL either performed or assessed with clinical judgement   ADL Overall ADL's : Modified independent                                       General ADL Comments: MOD-I (requires increase time only) for seated LB dressing, toilet transfers, standing hand hygiene, and functional mobility of short household distances (58ft) without AD     Vision Ability to See in Adequate Light: 0 Adequate Patient Visual Report: No change from baseline  Pertinent Vitals/Pain Pain Assessment: Faces Faces Pain Scale: Hurts a little bit Pain Location: chest & neck Pain Descriptors / Indicators: Aching Pain Intervention(s): Limited activity within patient's tolerance;Monitored during session        Extremity/Trunk Assessment Upper Extremity Assessment Upper Extremity Assessment: Overall WFL for tasks assessed   Lower Extremity Assessment Lower Extremity Assessment: Overall WFL for tasks assessed   Cervical / Trunk Assessment Cervical / Trunk  Assessment: Normal   Communication Communication Communication: Interpreter utilized;Prefers language other than English (vietnamese)   Cognition Arousal/Alertness: Awake/alert Behavior During Therapy: WFL for tasks assessed/performed Overall Cognitive Status: Within Functional Limits for tasks assessed                                       General Comments  While on RA, SpO2 99-100% throughout standing ADLs and functional mobility. RN informed    Exercises Other Exercises Other Exercises: Educated pt on role of OT, POC, and d/c recommendations. Pt verbalized understanding        Home Living Family/patient expects to be discharged to:: Private residence Living Arrangements: Children Available Help at Discharge: Family;Available PRN/intermittently Type of Home: House Home Access: Level entry     Home Layout: Multi-level;Other (Comment) (bedroom is located in basement. Bathrooms present on all 3 levels) Alternate Level Stairs-Number of Steps: flight -14   Bathroom Shower/Tub: Chief Strategy Officer: None          Prior Functioning/Environment Prior Level of Function : Independent/Modified Independent             Mobility Comments: Pt reports being independent without AD for functional mobility. Denies falls within past 12 months ADLs Comments: Pt reports being independent with ADLs and cooking. Pt does not drive        OT Problem List: Cardiopulmonary status limiting activity;Decreased activity tolerance      OT Treatment/Interventions:      OT Goals(Current goals can be found in the care plan section) Acute Rehab OT Goals Patient Stated Goal: to return home OT Goal Formulation: All assessment and education complete, DC therapy  OT Frequency:      AM-PAC OT "6 Clicks" Daily Activity     Outcome Measure Help from another person eating meals?: None Help from another person taking care of personal grooming?: None Help from  another person toileting, which includes using toliet, bedpan, or urinal?: None Help from another person bathing (including washing, rinsing, drying)?: None Help from another person to put on and taking off regular upper body clothing?: None Help from another person to put on and taking off regular lower body clothing?: None 6 Click Score: 24   End of Session Equipment Utilized During Treatment: Oxygen Nurse Communication: Mobility status;Other (comment) (spo2 throughout session)  Activity Tolerance: Patient tolerated treatment well Patient left: in bed;with call bell/phone within reach  OT Visit Diagnosis: Muscle weakness (generalized) (M62.81)                Time: 9242-6834 OT Time Calculation (min): 21 min Charges:  OT General Charges $OT Visit: 1 Visit OT Evaluation $OT Eval Moderate Complexity: 1 Mod OT Treatments $Self Care/Home Management : 8-22 mins  Fredirick Maudlin, OTR/L [place]

## 2021-02-21 NOTE — Progress Notes (Addendum)
PROGRESS NOTE    Jonathan Willis  RAQ:[id number] DOB: [birth year] DOA: 02/20/2021 PCP: Jearld Fenton, NP  Chief Complaint  Patient presents with   Chest Pain    Brief Narrative:  75 yo vietnamese speaking gentleman with hx COPD, chronic resp failure, T2DM, HTN, BPH who presented with increased SOB, wearing oxygen more often, headache, and intermittent vomitting.  He's been admitted with influenza Jaythan Hinely and COPD exacerbation.  Assessment & Plan:   Principal Problem:   Influenza Demetrias Goodbar Active Problems:   Essential hypertension   Centrilobular emphysema (HCC)   Chronic respiratory failure (HCC)   BPH (benign prostatic hyperplasia)   Sepsis ([place])   Diabetes mellitus without complication (HCC)  COPD exacerbation 2/2 Influenza Phyillis Dascoli infection Acute Hypoxic Respiratory Failure Currently requiring 2 L by [place] (reported on nocturnal oxygen as as needed?) Fever at presentation Tamiflu, steroids Scheduled and prn nebs Wean O2 as tolerated Appears he's on trelegy ellipta at home CXR with right sided volume loss and chronic right upper lobe scarring  HTN Telmisartan  BPH Tamsulosin myrbetriq  T2DM SSI Follow A1c  Pending quant gold, unclear why this was ordered.  Reported hx of TB.  Will follow up regarding hx and ?treatment.  Addendum: discussed with daughter, see 01/2014 discharge summary from Dr. Marcille Blanco, he ruled out for TB at that time with AFB negative x3, TB reportedly treated in the past.  This episode is due to influenza, low suspicion for TB.  Need to confirm meds above via med rec prior to continuing  DVT prophylaxis: lovenox Code Status: full  Family Communication: none at bedisde Disposition:   Status is: Inpatient  Remains inpatient appropriate because: need for inpatient care with copd exacerbation, influenza       Consultants:  none  Procedures:  none  Antimicrobials:  Anti-infectives (From admission, onward)    Start     Dose/Rate Route Frequency Ordered Stop    02/20/21 2245  oseltamivir (TAMIFLU) capsule 30 mg        30 mg Oral 2 times daily 02/20/21 2240 02/25/21 2159   02/20/21 2200  oseltamivir (TAMIFLU) capsule 75 mg        75 mg Oral  Once 02/20/21 2146 02/20/21 2245   02/20/21 2030  ceFEPIme (MAXIPIME) 2 g in sodium chloride 0.9 % 100 mL IVPB        2 g 200 mL/hr over 30 Minutes Intravenous  Once 02/20/21 2022 02/20/21 2101   02/20/21 2030  metroNIDAZOLE (FLAGYL) IVPB 500 mg        500 mg 100 mL/hr over 60 Minutes Intravenous  Once 02/20/21 2022 02/20/21 2136   02/20/21 2030  vancomycin (VANCOCIN) IVPB 1000 mg/200 mL premix        1,000 mg 200 mL/hr over 60 Minutes Intravenous  Once 02/20/21 2022 02/21/21 0025          Subjective: Continued SOB, feels better than presentation Interpreter used  Objective: Vitals:   02/21/21 1200 02/21/21 1203 02/21/21 1300 02/21/21 1306  BP: (!) 141/72 135/64 (!) 133/53 (!) 133/53  Pulse: 91 (!) 107 75 82  Resp:    20  Temp:      TempSrc:      SpO2: 100% 100% 100% 99%  Weight:      Height:        Intake/Output Summary (Last 24 hours) at 02/21/2021 1408 Last data filed at 02/21/2021 0622 Gross per 24 hour  Intake 1000 ml  Output --  Net 1000 ml   Danley Danker  Weights   02/20/21 2043 02/20/21 2052  Weight: 55.3 kg 55.3 kg    Examination:  General exam: Appears calm and comfortable sitting upeating breakfast Respiratory system: scattered  diffuse wheezing Cardiovascular system: S1 & S2 heard, RRR.  Gastrointestinal system: Abdomen is nondistended, soft and nontender. Central nervous system: Alert and oriented. No focal neurological deficits. Extremities: no LEE Skin: No rashes, lesions or ulcers Psychiatry: Judgement and insight appear normal. Mood & affect appropriate.     Data Reviewed: I have personally reviewed following labs and imaging studies  CBC: Recent Labs  Lab 02/20/21 2009 02/21/21 0848  WBC 7.0 7.1  NEUTROABS 5.1 6.4  HGB 14.0 12.5*  HCT 41.8 37.5*  MCV  93.5 96.2  PLT 267 433    Basic Metabolic Panel: Recent Labs  Lab 02/20/21 2009 02/21/21 0848  NA 136 138  K 3.4* 4.2  CL 102 107  CO2 25 27  GLUCOSE 215* 206*  BUN 22 20  CREATININE 0.93 0.98  CALCIUM 8.7* 7.9*    GFR: Estimated Creatinine Clearance: 46.7 mL/min (by C-G formula based on SCr of 0.98 mg/dL).  Liver Function Tests: Recent Labs  Lab 02/20/21 2009 02/21/21 0848  AST 31 24  ALT 26 20  ALKPHOS 67 52  BILITOT 0.7 0.5  PROT 7.8 6.3*  ALBUMIN 3.8 2.9*    CBG: Recent Labs  Lab 02/21/21 0020 02/21/21 0834 02/21/21 1200  GLUCAP 114* 198* 314*     Recent Results (from the past 240 hour(s))  Culture, blood (Routine x 2)     Status: None (Preliminary result)   Collection Time: 02/20/21  8:09 PM   Specimen: BLOOD  Result Value Ref Range Status   Specimen Description BLOOD BLOOD LEFT FOREARM  Final   Special Requests   Final    BOTTLES DRAWN AEROBIC AND ANAEROBIC Blood Culture adequate volume   Culture   Final    NO GROWTH < 12 HOURS Performed at [facility], [address] [place], [place] [address]    Report Status PENDING  Incomplete  Blood Culture (routine x 2)     Status: None (Preliminary result)   Collection Time: 02/20/21  8:09 PM   Specimen: BLOOD  Result Value Ref Range Status   Specimen Description BLOOD RIGHT ANTECUBITAL  Final   Special Requests   Final    BOTTLES DRAWN AEROBIC AND ANAEROBIC Blood Culture results may not be optimal due to an excessive volume of blood received in culture bottles   Culture   Final    NO GROWTH < 12 HOURS Performed at [facility], [address], [place], [place] [address]    Report Status PENDING  Incomplete  Resp Panel by RT-PCR (Flu Darling Cieslewicz&B, Covid) Nasopharyngeal Swab     Status: Abnormal   Collection Time: 02/20/21  8:30 PM   Specimen: Nasopharyngeal Swab; Nasopharyngeal(NP) swabs in vial transport medium  Result Value Ref Range Status   SARS Coronavirus 2 by RT PCR NEGATIVE  NEGATIVE Final    Comment: (NOTE) SARS-CoV-2 target nucleic acids are NOT DETECTED.  The SARS-CoV-2 RNA is generally detectable in upper respiratory specimens during the acute phase of infection. The lowest concentration of SARS-CoV-2 viral copies this assay can detect is 138 copies/mL. Kimmora Risenhoover negative result does not preclude SARS-Cov-2 infection and should not be used as the sole basis for treatment or other patient management decisions. Song Garris negative result may occur with  improper specimen collection/handling, submission of specimen other than nasopharyngeal swab, presence of  viral mutation(s) within the areas targeted by this assay, and inadequate number of viral copies(<138 copies/mL). Alean Kromer negative result must be combined with clinical observations, patient history, and epidemiological information. The expected result is Negative.  Fact Sheet for Patients:  EntrepreneurPulse.com.au  Fact Sheet for Healthcare Providers:  IncredibleEmployment.be  This test is no t yet approved or cleared by the [place] FDA and  has been authorized for detection and/or diagnosis of SARS-CoV-2 by FDA under an Emergency Use Authorization (EUA). This EUA will remain  in effect (meaning this test can be used) for the duration of the COVID-19 declaration under Section 564(b)(1) of the Act, 21 U.S.C.section 360bbb-3(b)(1), unless the authorization is terminated  or revoked sooner.       Influenza Sumiye Hirth by PCR POSITIVE (Teniola Tseng) NEGATIVE Final   Influenza B by PCR NEGATIVE NEGATIVE Final    Comment: (NOTE) The Xpert Xpress SARS-CoV-2/FLU/RSV plus assay is intended as an aid in the diagnosis of influenza from Nasopharyngeal swab specimens and should not be used as Mikaella Escalona sole basis for treatment. Nasal washings and aspirates are unacceptable for Xpert Xpress SARS-CoV-2/FLU/RSV testing.  Fact Sheet for Patients: EntrepreneurPulse.com.au  Fact Sheet for  Healthcare Providers: IncredibleEmployment.be  This test is not yet approved or cleared by the [place] FDA and has been authorized for detection and/or diagnosis of SARS-CoV-2 by FDA under an Emergency Use Authorization (EUA). This EUA will remain in effect (meaning this test can be used) for the duration of the COVID-19 declaration under Section 564(b)(1) of the Act, 21 U.S.C. section 360bbb-3(b)(1), unless the authorization is terminated or revoked.  Performed at [facility], [address], [place], [place] [address]          Radiology Studies: DG Chest 1 View  Result Date: 02/20/2021 CLINICAL DATA:  Chest pain and cough. EXAM: CHEST  1 VIEW COMPARISON:  September 10, 2020 FINDINGS: Low lung volumes are again seen on the right. Chronic right upper lobe scarring is present. Emmersen Garraway stable calcified right upper lobe lung nodule is seen. There is no evidence of acute infiltrate, pleural effusion or pneumothorax. Left right shift of the superior mediastinal structures is noted. The heart size and mediastinal contours are otherwise within normal limits. The visualized skeletal structures are unremarkable. IMPRESSION: 1. Stable chronic right upper lobe scarring and right-sided volume loss. 2. No acute or active cardiopulmonary disease. Electronically Signed   By: Virgina Norfolk M.D.   On: 02/20/2021 20:42        Scheduled Meds:  enoxaparin (LOVENOX) injection  40 mg Subcutaneous Q24H   insulin aspart  0-15 Units Subcutaneous TID WC   insulin aspart  0-5 Units Subcutaneous QHS   ipratropium-albuterol  3 mL Nebulization Q6H   methylPREDNISolone (SOLU-MEDROL) injection  40 mg Intravenous Q12H   Followed by   Derrill Memo ON 02/22/2021] predniSONE  40 mg Oral Q breakfast   oseltamivir  30 mg Oral BID   Continuous Infusions:   LOS: 1 day    Time spent: over 30 min    Fayrene Helper, MD [facility]   To contact the attending provider between  7A-7P or the covering provider during after hours 7P-7A, please log into the web site [email or web address] and access using universal  password for that web site. If you do not have the password, please call the hospital operator.  02/21/2021, 2:08 PM

## 2021-02-21 NOTE — ED Notes (Signed)
Pt assisted up to commode for bowel movement.

## 2021-02-21 NOTE — Progress Notes (Signed)
PHARMACY NOTE:  ANTIMICROBIAL RENAL DOSAGE ADJUSTMENT  Current antimicrobial regimen includes a mismatch between antimicrobial dosage and estimated renal function.  As per policy approved by the Pharmacy & Therapeutics and Medical Executive Committees, the antimicrobial dosage will be adjusted accordingly.  Current antimicrobial dosage:  Tamiflu 75 mg BID  Indication: Influenza  Renal Function:  Estimated Creatinine Clearance: 49.2 mL/min (by C-G formula based on SCr of 0.93 mg/dL).    Antimicrobial dosage has been changed to:  Tamiflu 75 mg x 1 then 30 mg BID  Additional comments: CrCl > 30 and < 60 mL/min   Thank you for allowing pharmacy to be a part of this patient's care.  Benita Gutter, [facility] 02/21/2021 7:17 AM

## 2021-02-22 DIAGNOSIS — I10 Essential (primary) hypertension: Secondary | ICD-10-CM

## 2021-02-22 DIAGNOSIS — J441 Chronic obstructive pulmonary disease with (acute) exacerbation: Secondary | ICD-10-CM

## 2021-02-22 DIAGNOSIS — J101 Influenza due to other identified influenza virus with other respiratory manifestations: Secondary | ICD-10-CM | POA: Diagnosis not present

## 2021-02-22 DIAGNOSIS — E119 Type 2 diabetes mellitus without complications: Secondary | ICD-10-CM | POA: Diagnosis not present

## 2021-02-22 LAB — CBC WITH DIFFERENTIAL/PLATELET
Abs Immature Granulocytes: 0.02 10*3/uL (ref 0.00–0.07)
Basophils Absolute: 0 10*3/uL (ref 0.0–0.1)
Basophils Relative: 0 %
Eosinophils Absolute: 0 10*3/uL (ref 0.0–0.5)
Eosinophils Relative: 0 %
HCT: 39.1 % (ref 39.0–52.0)
Hemoglobin: 13 g/dL (ref 13.0–17.0)
Immature Granulocytes: 0 %
Lymphocytes Relative: 13 %
Lymphs Abs: 1 10*3/uL (ref 0.7–4.0)
MCH: 30.7 pg (ref 26.0–34.0)
MCHC: 33.2 g/dL (ref 30.0–36.0)
MCV: 92.2 fL (ref 80.0–100.0)
Monocytes Absolute: 0.2 10*3/uL (ref 0.1–1.0)
Monocytes Relative: 3 %
Neutro Abs: 6.8 10*3/uL (ref 1.7–7.7)
Neutrophils Relative %: 84 %
Platelets: 252 10*3/uL (ref 150–400)
RBC: 4.24 MIL/uL (ref 4.22–5.81)
RDW: 12.5 % (ref 11.5–15.5)
WBC: 8 10*3/uL (ref 4.0–10.5)
nRBC: 0 % (ref 0.0–0.2)

## 2021-02-22 LAB — COMPREHENSIVE METABOLIC PANEL
ALT: 19 U/L (ref 0–44)
AST: 26 U/L (ref 15–41)
Albumin: 3.2 g/dL — ABNORMAL LOW (ref 3.5–5.0)
Alkaline Phosphatase: 55 U/L (ref 38–126)
Anion gap: 8 (ref 5–15)
BUN: 26 mg/dL — ABNORMAL HIGH (ref 8–23)
CO2: 25 mmol/L (ref 22–32)
Calcium: 8.7 mg/dL — ABNORMAL LOW (ref 8.9–10.3)
Chloride: 106 mmol/L (ref 98–111)
Creatinine, Ser: 0.81 mg/dL (ref 0.61–1.24)
GFR, Estimated: >60 mL/min (ref >60)
Glucose, Bld: 187 mg/dL — ABNORMAL HIGH (ref 70–99)
Potassium: 4.1 mmol/L (ref 3.5–5.1)
Sodium: 139 mmol/L (ref 135–145)
Total Bilirubin: 0.3 mg/dL (ref 0.3–1.2)
Total Protein: 6.6 g/dL (ref 6.5–8.1)

## 2021-02-22 LAB — MAGNESIUM: Magnesium: 2.1 mg/dL (ref 1.7–2.4)

## 2021-02-22 LAB — CBG MONITORING, ED: Glucose-Capillary: 158 mg/dL — ABNORMAL HIGH (ref 70–99)

## 2021-02-22 LAB — HEMOGLOBIN A1C
Hgb A1c MFr Bld: 8.5 % — ABNORMAL HIGH (ref 4.8–5.6)
Mean Plasma Glucose: 197 mg/dL

## 2021-02-22 LAB — PHOSPHORUS: Phosphorus: 3 mg/dL (ref 2.5–4.6)

## 2021-02-22 MED ORDER — MIRABEGRON ER 25 MG PO TB24
25.0000 mg | ORAL_TABLET | Freq: Every day | ORAL | Status: DC
  Filled 2021-02-22: qty 1

## 2021-02-22 MED ORDER — FLUTICASONE FUROATE-VILANTEROL 100-25 MCG/ACT IN AEPB
1.0000 | INHALATION_SPRAY | Freq: Every day | RESPIRATORY_TRACT | Status: DC
  Filled 2021-02-22: qty 28

## 2021-02-22 MED ORDER — GUAIFENESIN 100 MG/5ML PO LIQD
10.0000 mL | ORAL | 0 refills | Status: DC | PRN
Start: 2021-02-22 — End: 2022-02-22

## 2021-02-22 MED ORDER — UMECLIDINIUM BROMIDE 62.5 MCG/ACT IN AEPB
1.0000 | INHALATION_SPRAY | Freq: Every day | RESPIRATORY_TRACT | Status: DC
  Filled 2021-02-22: qty 7

## 2021-02-22 MED ORDER — PREDNISONE 20 MG PO TABS: ORAL_TABLET | ORAL | 0 refills | Status: DC

## 2021-02-22 MED ORDER — ALBUTEROL SULFATE HFA 108 (90 BASE) MCG/ACT IN AERS: 2.0000 | INHALATION_SPRAY | Freq: Four times a day (QID) | RESPIRATORY_TRACT | Status: DC | PRN

## 2021-02-22 MED ORDER — GUAIFENESIN 100 MG/5ML PO LIQD
10.0000 mL | ORAL | Status: DC | PRN
  Filled 2021-02-22: qty 10

## 2021-02-22 MED ORDER — OSELTAMIVIR PHOSPHATE 30 MG PO CAPS: 30.0000 mg | ORAL_CAPSULE | Freq: Two times a day (BID) | ORAL | 0 refills | Status: AC

## 2021-02-22 MED ORDER — FLUTICASONE-UMECLIDIN-VILANT 100-62.5-25 MCG/ACT IN AEPB: 1.0000 | INHALATION_SPRAY | Freq: Every day | RESPIRATORY_TRACT | Status: DC

## 2021-02-22 MED ORDER — IRBESARTAN 75 MG PO TABS
75.0000 mg | ORAL_TABLET | Freq: Every day | ORAL | Status: DC
  Filled 2021-02-22: qty 1

## 2021-02-22 NOTE — Discharge Summary (Signed)
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  He's been admitted with influenza a and COPD exacerbation.   COPD exacerbation 2/2 Influenza A infection Acute Hypoxic Respiratory Failure--now resolved. Sats 99% on RA --Fever at presentation --Tamiflu, steroids -Scheduled and prn nebs --Weaned O2 to RA --cont with home inhalers --CXR with right sided volume loss and chronic right upper lobe scarring -- PRN Robitussin   HTN Telmisartan   BPH myrbetriq resume home meds SSI Follow A1c     DVT prophylaxis: lovenox Code Status: full  Family Communication: spoke with daughter-in-law [name] on the phone  disposition: home today  overall improving. Discussed discharge instructions via video interpreter. CONSULTS OBTAINED:    DRUG ALLERGIES:  No Known Allergies  DISCHARGE MEDICATIONS:   Allergies as of 02/22/2021   No Known Allergies      Medication List     STOP taking these medications    benzonatate 100 MG capsule Commonly known as: Tessalon Perles   tamsulosin 0.4 MG Caps  capsule Commonly known as: FLOMAX       TAKE these medications    Accu-Chek Aviva Connect w/Device Kit 1 Device by Does not apply route daily. Dx E11.4   Accu-Chek Aviva Plus test strip Generic drug: glucose blood USE 1 TEST STRIP TO TEST BLOOD SUGAR DAILY   Accu-Chek Softclix Lancets lancets USE TO TEST ONCE DAILY   albuterol 108 (90 Base) MCG/ACT inhaler Commonly known as: VENTOLIN HFA Inhale 2 puffs into the lungs every 6 (six) hours as needed for wheezing or shortness of breath.   guaiFENesin 100 MG/5ML liquid Commonly known as: ROBITUSSIN Take 10 mLs by mouth every 4 (four) hours as needed for cough or to loosen phlegm.   mirabegron ER 25 MG Tb24 tablet Commonly known as: MYRBETRIQ Take 1 tablet (25 mg total) by mouth daily.   oseltamivir 30 MG capsule Commonly known as: TAMIFLU Take 1 capsule (30 mg total) by mouth 2 (two) times daily for 3 days.   predniSONE 20 MG tablet Commonly known as: DELTASONE Take 2 tabs (69m ) daily for 3 days   Spacer/Aero-Hold Chamber Bags Misc 1 Device by Does not apply route every 6 (six) hours as needed (with inhaler).   telmisartan 40 MG tablet Commonly known as: MICARDIS Take 1 tablet (40 mg total) by mouth daily.   Trelegy Ellipta 100-62.5-25 MCG/ACT Aepb Generic drug: Fluticasone-Umeclidin-Vilant Inhale 1 puff into the lungs daily.        If you experience worsening of your admission symptoms, develop shortness of breath, life threatening emergency, suicidal or homicidal  thoughts you must seek medical attention immediately by calling 911 or calling your MD immediately  if symptoms less severe.  You Must read complete instructions/literature along with all the possible adverse reactions/side effects for all the Medicines you take and that have been prescribed to you. Take any new Medicines after you have completely understood and accept all the possible adverse reactions/side effects.   Please note  You were cared for  by a hospitalist during your hospital stay. If you have any questions about your discharge medications or the care you received while you were in the hospital after you are discharged, you can call the unit and asked to speak with the hospitalist on call if the hospitalist that took care of you is not available. Once you are discharged, your primary care physician will handle any further medical issues. Please note that NO REFILLS for any discharge medications will be authorized once you are discharged, as it is imperative that you return to your primary care physician (or establish a relationship with a primary care physician if you do not have one) for your aftercare needs so that they can reassess your need for medications and monitor your lab values. Today   SUBJECTIVE    patient has some lingering cough. No fever. Sats nine & room air. Sitting at the edge of the bed no respiratory distress. No family at bedside. VITAL SIGNS:  Blood pressure (!) 157/87, pulse 82, temperature 99.2 F (37.3 C), temperature source Oral, resp. rate (!) 22, height _0  (1.549 m), weight 55.3 kg, SpO2 99 %.  I/O:   Intake/Output Summary (Last 24 hours) at 02/22/2021 0844 Last data filed at 02/22/2021 0112 Gross per 24 hour  Intake --  Output 300 ml  Net -300 ml    PHYSICAL EXAMINATION:  GENERAL:  75 y.o.-year-old patient lying in the bed with no acute distress.  LUNGS: Normal breath sounds bilaterally, no wheezing, rales,rhonchi or crepitation. No use of accessory muscles of respiration.  CARDIOVASCULAR: S1, S2 normal. No murmurs, rubs, or gallops.  ABDOMEN: Soft, non-tender, non-distended. Bowel sounds present. No organomegaly or mass.  EXTREMITIES: No pedal edema, cyanosis, or clubbing.  NEUROLOGIC: non-focal PSYCHIATRIC:  patient is alert and oriented x 3.  SKIN: No obvious rash, lesion, or ulcer.   DATA REVIEW:   CBC  Recent Labs  Lab 02/22/21 0643  WBC 8.0  HGB 13.0  HCT 39.1  PLT 252     Chemistries  Recent Labs  Lab 02/22/21 0643  NA 139  K 4.1  CL 106  CO2 25  GLUCOSE 187*  BUN 26*  CREATININE 0.81  CALCIUM 8.7*  MG 2.1  AST 26  ALT 19  ALKPHOS 55  BILITOT 0.3    Microbiology Results   Recent Results (from the past 240 hour(s))  Culture, blood (Routine x 2)     Status: None (Preliminary result)   Collection Time: 02/20/21  8:09 PM   Specimen: BLOOD  Result Value Ref Range Status   Specimen Description BLOOD BLOOD LEFT FOREARM  Final   Special Requests   Final    BOTTLES DRAWN AEROBIC AND ANAEROBIC Blood Culture adequate volume   Culture   Final    NO GROWTH < 12 HOURS Performed at [facility], [address], [place], [place] [address]    Report Status PENDING  Incomplete  Blood Culture (routine x 2)     Status: None (Preliminary result)   Collection Time: 02/20/21  8:09 PM  Specimen: BLOOD  Result Value Ref Range Status   Specimen Description BLOOD RIGHT ANTECUBITAL  Final   Special Requests   Final    BOTTLES DRAWN AEROBIC AND ANAEROBIC Blood Culture results may not be optimal due to an excessive volume of blood received in culture bottles   Culture   Final    NO GROWTH < 12 HOURS Performed at [facility], [address] [place], [place] [address]    Report Status PENDING  Incomplete  Resp Panel by RT-PCR (Flu A&B, Covid) Nasopharyngeal Swab     Status: Abnormal   Collection Time: 02/20/21  8:30 PM   Specimen: Nasopharyngeal Swab; Nasopharyngeal(NP) swabs in vial transport medium  Result Value Ref Range Status   SARS Coronavirus 2 by RT PCR NEGATIVE NEGATIVE Final    Comment: (NOTE) SARS-CoV-2 target nucleic acids are NOT DETECTED.  The SARS-CoV-2 RNA is generally detectable in upper respiratory specimens during the acute phase of infection. The lowest concentration of SARS-CoV-2 viral copies this assay can detect is 138 copies/mL. A negative result does not preclude SARS-Cov-2 infection and should not be  used as the sole basis for treatment or other patient management decisions. A negative result may occur with  improper specimen collection/handling, submission of specimen other than nasopharyngeal swab, presence of viral mutation(s) within the areas targeted by this assay, and inadequate number of viral copies(<138 copies/mL). A negative result must be combined with clinical observations, patient history, and epidemiological information. The expected result is Negative.  Fact Sheet for Patients:  EntrepreneurPulse.com.au  Fact Sheet for Healthcare Providers:  IncredibleEmployment.be  This test is no t yet approved or cleared by the [place] FDA and  has been authorized for detection and/or diagnosis of SARS-CoV-2 by FDA under an Emergency Use Authorization (EUA). This EUA will remain  in effect (meaning this test can be used) for the duration of the COVID-19 declaration under Section 564(b)(1) of the Act, 21 U.S.C.section 360bbb-3(b)(1), unless the authorization is terminated  or revoked sooner.       Influenza A by PCR POSITIVE (A) NEGATIVE Final   Influenza B by PCR NEGATIVE NEGATIVE Final    Comment: (NOTE) The Xpert Xpress SARS-CoV-2/FLU/RSV plus assay is intended as an aid in the diagnosis of influenza from Nasopharyngeal swab specimens and should not be used as a sole basis for treatment. Nasal washings and aspirates are unacceptable for Xpert Xpress SARS-CoV-2/FLU/RSV testing.  Fact Sheet for Patients: EntrepreneurPulse.com.au  Fact Sheet for Healthcare Providers: IncredibleEmployment.be  This test is not yet approved or cleared by the [place] FDA and has been authorized for detection and/or diagnosis of SARS-CoV-2 by FDA under an Emergency Use Authorization (EUA). This EUA will remain in effect (meaning this test can be used) for the duration of the COVID-19 declaration under Section  564(b)(1) of the Act, 21 U.S.C. section 360bbb-3(b)(1), unless the authorization is terminated or revoked.  Performed at [facility] [address] [place], [place] [address]     RADIOLOGY:  DG Chest 1 View  Result Date: 02/20/2021 CLINICAL DATA:  Chest pain and cough. EXAM: CHEST  1 VIEW COMPARISON:  September 10, 2020 FINDINGS: Low lung volumes are again seen on the right. Chronic right upper lobe scarring is present. A stable calcified right upper lobe lung nodule is seen. There is no evidence of acute infiltrate, pleural effusion or pneumothorax. Left right shift of the superior mediastinal structures is noted. The heart size and mediastinal contours are otherwise within normal limits.  The visualized skeletal structures are unremarkable. IMPRESSION: 1. Stable chronic right upper lobe scarring and right-sided volume loss. 2. No acute or active cardiopulmonary disease. Electronically Signed   By: Virgina Norfolk M.D.   On: 02/20/2021 20:42     CODE STATUS:     Code Status Orders  (From admission, onward)           Start     Ordered   02/20/21 2239  Full code  Continuous        02/20/21 2240           Code Status History     Date Active Date Inactive Code Status Order ID Comments User Context   09/02/2020 0944 09/04/2020 1849 Full Code [id number]  Collier Bullock, MD ED   01/21/2017 0757 01/23/2017 1735 Full Code [id number]  Saundra Shelling, MD Inpatient        TOTAL TIME TAKING CARE OF THIS PATIENT: 40 minutes.    Fritzi Mandes M.D  [facility]    CC: Primary care physician; Jearld Fenton, NP

## 2021-02-22 NOTE — Progress Notes (Signed)
Physical Therapy Treatment Patient Details Name: Jonathan Willis MRN: [id number] DOB: 1946-02-09 Today's Date: 02/22/2021   History of Present Illness Jonathan Willis is a 75 y.o. male with medical history significant for COPD and chronic respiratory failure on nighttime and as needed O2, followed by pulmonology, DM, HTN, BPH who presents to the ED with weakness, shortness of breath, needing to wear oxygen round-the-clock for the past couple days.  Also has a headache and intermittent nonbloody nonbilious vomiting.  Pt found to have influenza A.    PT Comments    Pt received standing in room. Interpreter used on [place] on speaker phone Barnabas Lister, ID#: [phone]). Open to trialing stairs prior to d/c home. Flu precautions followed and transport chair with pt to ortho gym. Pt educated on stair training with pt able to asc/desc 4 stairs x4 times with R rail asc with normal pattern and mechanics with no LOB or SOB with supervision. Pt endorses being confident in his abilities to safely use stairs in home. SPO2 > 90% post stairs and amb from ortho gym back to ED with step through pattern. During amb pt educated on energy conservation and graded activity to return to baseline. Upon returning to room, pt displays mild SOB with HR up to 111 BPM with SPO2 > 90% still. D/c recs remain appropriate and pt safe to d/c back to home environment.    Recommendations for follow up therapy are one component of a multi-disciplinary discharge planning process, led by the attending physician.  Recommendations may be updated based on patient status, additional functional criteria and insurance authorization.  Follow Up Recommendations  No PT follow up     Assistance Recommended at Discharge PRN  Equipment Recommendations  None recommended by PT    Recommendations for Other Services       Precautions / Restrictions Precautions Precautions: None Restrictions Weight Bearing Restrictions: No     Mobility  Bed Mobility Overal bed  mobility: Independent             General bed mobility comments: Able to perform sit<>stand transfers with ease Patient Response: Cooperative  Transfers Overall transfer level: Independent Equipment used: None Transfers: Sit to/from Stand Sit to Stand: Independent                Ambulation/Gait Ambulation/Gait assistance: Modified independent (Device/Increase time) Gait Distance (Feet): 1000 Feet Assistive device: None Gait Pattern/deviations: WFL(Within Functional Limits)           Stairs Stairs: Yes Stairs assistance: Supervision Stair Management: One rail Right;Alternating pattern;Forwards Number of Stairs: 16     Wheelchair Mobility    Modified Rankin (Stroke Patients Only)       Balance Overall balance assessment: Modified Independent                                          Cognition Arousal/Alertness: Awake/alert Behavior During Therapy: WFL for tasks assessed/performed Overall Cognitive Status: Within Functional Limits for tasks assessed                                          Exercises Other Exercises Other Exercises: energy conservation techniques    General Comments General comments (skin integrity, edema, etc.): Maintaining SPO2 > 90% with mobility on RA      Pertinent Vitals/Pain  Pain Assessment: No/denies pain    Home Living                          Prior Function            PT Goals (current goals can now be found in the care plan section) Acute Rehab PT Goals Patient Stated Goal: return home PT Goal Formulation: With patient Time For Goal Achievement: 03/07/21 Potential to Achieve Goals: Good Progress towards PT goals: Goals met and updated - see care plan    Frequency    7X/week      PT Plan Current plan remains appropriate    Co-evaluation              AM-PAC PT "6 Clicks" Mobility   Outcome Measure  Help needed turning from your back to your side  while in a flat bed without using bedrails?: None Help needed moving from lying on your back to sitting on the side of a flat bed without using bedrails?: None Help needed moving to and from a bed to a chair (including a wheelchair)?: None Help needed standing up from a chair using your arms (e.g., wheelchair or bedside chair)?: None Help needed to walk in hospital room?: None Help needed climbing 3-5 steps with a railing? : A Little 6 Click Score: 23    End of Session   Activity Tolerance: Patient tolerated treatment well Patient left: in bed;with call bell/phone within reach Nurse Communication: Mobility status PT Visit Diagnosis: Difficulty in walking, not elsewhere classified (R26.2)     Time: 1030-1047 PT Time Calculation (min) (ACUTE ONLY): 17 min  Charges:  $Gait Training: 8-22 mins                    Salem Caster. Fairly IV, PT, DPT Physical Therapist- [facility]  02/22/2021, 11:53 AM

## 2021-02-22 NOTE — ED Notes (Signed)
Pt up to use restroom and brush teeth. Pt disconnected from monitor to brush teeth and use restroom.

## 2021-02-22 NOTE — ED Notes (Signed)
Report to chelsea, rn

## 2021-02-23 LAB — URINE CULTURE: Culture: 20000 — AB

## 2021-02-23 LAB — QUANTIFERON-TB GOLD PLUS: QuantiFERON-TB Gold Plus: UNDETERMINED — AB

## 2021-02-23 LAB — QUANTIFERON-TB GOLD PLUS (RQFGPL)
QuantiFERON Mitogen Value: 0.02 IU/mL
QuantiFERON Nil Value: 0 IU/mL
QuantiFERON TB1 Ag Value: 0 IU/mL
QuantiFERON TB2 Ag Value: 0.01 IU/mL

## 2021-02-25 ENCOUNTER — Ambulatory Visit: Payer: Self-pay | Admitting: *Deleted

## 2021-02-25 LAB — CULTURE, BLOOD (ROUTINE X 2)
Culture: NO GROWTH
Culture: NO GROWTH
Special Requests: ADEQUATE

## 2021-02-25 NOTE — Telephone Encounter (Signed)
Jonathan Willis called in stated pt was at the ED 5 days ago. Pt is not feeling well with headache,cough and overall body pain.    No appointments available until 03/02/21.   Please advise.   Spoke with daughter in law Jonathan Willis, pt not present. Attempted to reach pt assisted by Interpreter [phone]. Unable to leave message"Not accepting calls at this time."

## 2021-02-25 NOTE — Telephone Encounter (Signed)
Difficult call, unable to speak with pt, will not answer phone. 2nd attempt. Daughter in law [name] reports Pt probably would not answer. She is not with pt. States he has appt "With the lung doctor tomorrow." Reports discharged from hospital last week "And not any better." States has machine for neb treatments   "But no medicine to go in it."  States is taking prednisone prescribed. Reports still with dizziness, body aches and cough. Advised if pt has worsening symptoms when she gets home from work to go to ED.Otherwise call back for triage. Verbalizes understanding.   Called practice for consult, Rachell. Will route for review. Reason for Disposition  [1] MODERATE difficulty breathing (e.g., speaks in phrases, SOB even at rest, pulse 100-120) AND [2] still present when not coughing  Answer Assessment - Initial Assessment Questions 1. ONSET: "When did the cough begin?"      See summary please 2. SEVERITY: "How bad is the cough today?"      *No Answer* 3. SPUTUM: "Describe the color of your sputum" (none, dry cough; clear, white, yellow, green)     *No Answer* 4. HEMOPTYSIS: "Are you coughing up any blood?" If so ask: "How much?" (flecks, streaks, tablespoons, etc.)     *No Answer* 5. DIFFICULTY BREATHING: "Are you having difficulty breathing?" If Yes, ask: "How bad is it?" (e.g., mild, moderate, severe)    - MILD: No SOB at rest, mild SOB with walking, speaks normally in sentences, can lie down, no retractions, pulse < 100.    - MODERATE: SOB at rest, SOB with minimal exertion and prefers to sit, cannot lie down flat, speaks in phrases, mild retractions, audible wheezing, pulse 100-120.    - SEVERE: Very SOB at rest, speaks in single words, struggling to breathe, sitting hunched forward, retractions, pulse > 120      *No Answer* 6. FEVER: "Do you have a fever?" If Yes, ask: "What is your temperature, how was it measured, and when did it start?"     *No Answer* 7. CARDIAC HISTORY: "Do you have  any history of heart disease?" (e.g., heart attack, congestive heart failure)      *No Answer* 8. LUNG HISTORY: "Do you have any history of lung disease?"  (e.g., pulmonary embolus, asthma, emphysema)     *No Answer* 9. PE RISK FACTORS: "Do you have a history of blood clots?" (or: recent major surgery, recent prolonged travel, bedridden)     *No Answer* 10. OTHER SYMPTOMS: "Do you have any other symptoms?" (e.g., runny nose, wheezing, chest pain)       *No Answer* 11. PREGNANCY: "Is there any chance you are pregnant?" "When was your last menstrual period?"       *No Answer* 12. TRAVEL: "Have you traveled out of the country in the last month?" (e.g., travel history, exposures)       *No Answer*  Protocols used: Cough - Acute Productive-A-AH

## 2021-02-26 DIAGNOSIS — J9601 Acute respiratory failure with hypoxia: Secondary | ICD-10-CM | POA: Diagnosis not present

## 2021-02-26 NOTE — Telephone Encounter (Signed)
I called and spoke with [name] and she informed me that the patient have an appointment scheduled today at 1:30pm with his pulmonologist. I informed her to ask the pulmonologist about placing the order for the nebulizer medication. Also confirm with the provider if he should pick up the prescription for the Tamiflu now or is he past the recommended time frame.

## 2021-03-26 DIAGNOSIS — J449 Chronic obstructive pulmonary disease, unspecified: Secondary | ICD-10-CM | POA: Diagnosis not present

## 2021-03-26 DIAGNOSIS — R0602 Shortness of breath: Secondary | ICD-10-CM | POA: Diagnosis not present

## 2021-03-30 ENCOUNTER — Other Ambulatory Visit: Payer: Self-pay | Admitting: Pulmonary Disease

## 2021-03-30 DIAGNOSIS — J449 Chronic obstructive pulmonary disease, unspecified: Secondary | ICD-10-CM

## 2021-04-13 DIAGNOSIS — R0602 Shortness of breath: Secondary | ICD-10-CM | POA: Diagnosis not present

## 2021-04-17 ENCOUNTER — Other Ambulatory Visit: Payer: Self-pay

## 2021-04-17 ENCOUNTER — Emergency Department: Payer: Medicare Other

## 2021-04-17 DIAGNOSIS — R0602 Shortness of breath: Secondary | ICD-10-CM | POA: Diagnosis not present

## 2021-04-17 DIAGNOSIS — R519 Headache, unspecified: Secondary | ICD-10-CM | POA: Diagnosis not present

## 2021-04-17 DIAGNOSIS — Z79899 Other long term (current) drug therapy: Secondary | ICD-10-CM | POA: Insufficient documentation

## 2021-04-17 DIAGNOSIS — I10 Essential (primary) hypertension: Secondary | ICD-10-CM | POA: Insufficient documentation

## 2021-04-17 DIAGNOSIS — Z7951 Long term (current) use of inhaled steroids: Secondary | ICD-10-CM | POA: Insufficient documentation

## 2021-04-17 DIAGNOSIS — M791 Myalgia, unspecified site: Secondary | ICD-10-CM | POA: Diagnosis not present

## 2021-04-17 DIAGNOSIS — E119 Type 2 diabetes mellitus without complications: Secondary | ICD-10-CM | POA: Insufficient documentation

## 2021-04-17 DIAGNOSIS — J441 Chronic obstructive pulmonary disease with (acute) exacerbation: Secondary | ICD-10-CM | POA: Diagnosis not present

## 2021-04-17 DIAGNOSIS — Z20822 Contact with and (suspected) exposure to covid-19: Secondary | ICD-10-CM | POA: Diagnosis not present

## 2021-04-17 LAB — RESP PANEL BY RT-PCR (FLU A&B, COVID) ARPGX2
Influenza A by PCR: NEGATIVE
Influenza B by PCR: NEGATIVE
SARS Coronavirus 2 by RT PCR: NEGATIVE

## 2021-04-17 LAB — BASIC METABOLIC PANEL
Anion gap: 7 (ref 5–15)
BUN: 12 mg/dL (ref 8–23)
CO2: 27 mmol/L (ref 22–32)
Calcium: 9.4 mg/dL (ref 8.9–10.3)
Chloride: 105 mmol/L (ref 98–111)
Creatinine, Ser: 0.86 mg/dL (ref 0.61–1.24)
GFR, Estimated: >60 mL/min (ref >60)
Glucose, Bld: 135 mg/dL — ABNORMAL HIGH (ref 70–99)
Potassium: 3.5 mmol/L (ref 3.5–5.1)
Sodium: 139 mmol/L (ref 135–145)

## 2021-04-17 LAB — CBC
HCT: 37.8 % — ABNORMAL LOW (ref 39.0–52.0)
Hemoglobin: 12.6 g/dL — ABNORMAL LOW (ref 13.0–17.0)
MCH: 31.8 pg (ref 26.0–34.0)
MCHC: 33.3 g/dL (ref 30.0–36.0)
MCV: 95.5 fL (ref 80.0–100.0)
Platelets: 266 10*3/uL (ref 150–400)
RBC: 3.96 MIL/uL — ABNORMAL LOW (ref 4.22–5.81)
RDW: 14.2 % (ref 11.5–15.5)
WBC: 6.7 10*3/uL (ref 4.0–10.5)
nRBC: 0 % (ref 0.0–0.2)

## 2021-04-17 LAB — TROPONIN I (HIGH SENSITIVITY): Troponin I (High Sensitivity): 4 ng/L (ref <18)

## 2021-04-17 IMAGING — CR DG CHEST 1V
1 series · 1 of 1 positions shown · non-contrast
Comparison: [DATE]

CLINICAL DATA: Generalized body aches, headache

EXAM:
CHEST  1 VIEW

[chest ap]
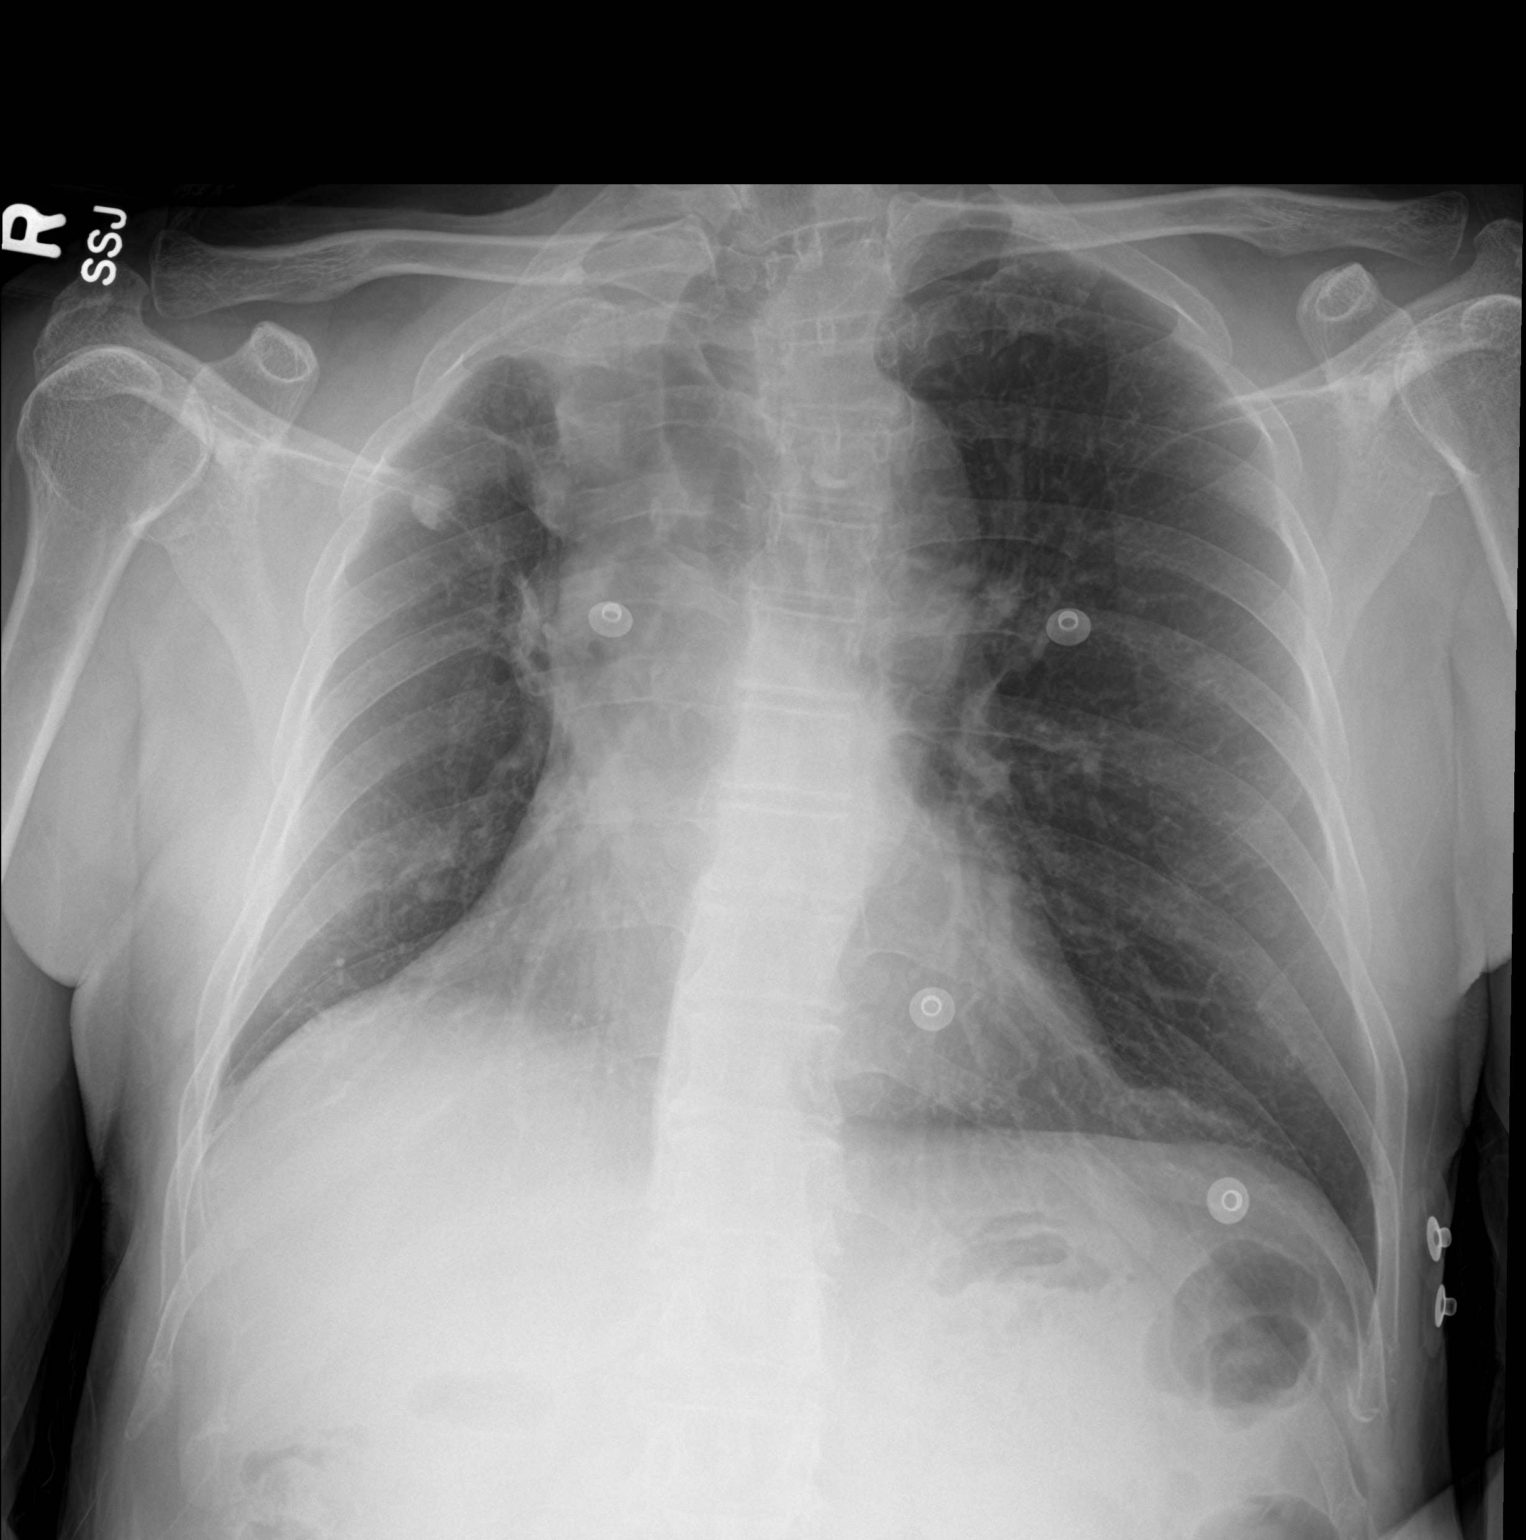

[1 of 1 positions shown; findings below may reference images not displayed]

FINDINGS: Heart is normal size. No confluent airspace opacities or effusions.
No acute bony abnormality.
IMPRESSION: No active disease.

## 2021-04-17 NOTE — ED Triage Notes (Addendum)
Pt presents to ER c/o generalized body aches, headache x4 days.   Pt states he was given lasix by his cardiologist d/t fluid on his lungs around 2 weeks ago.  Pt states he is having some sob and chest pain at this time.  Pt A&O x4 at this time.  NAD noted.

## 2021-04-18 ENCOUNTER — Emergency Department
Admission: EM | Admit: 2021-04-18 | Discharge: 2021-04-18 | Disposition: A | Discharge status: Home / Self Care | Payer: Medicare Other | Attending: Emergency Medicine | Admitting: Emergency Medicine

## 2021-04-18 DIAGNOSIS — R52 Pain, unspecified: Secondary | ICD-10-CM

## 2021-04-18 DIAGNOSIS — J441 Chronic obstructive pulmonary disease with (acute) exacerbation: Secondary | ICD-10-CM | POA: Diagnosis not present

## 2021-04-18 DIAGNOSIS — R0602 Shortness of breath: Secondary | ICD-10-CM

## 2021-04-18 LAB — URINALYSIS, ROUTINE W REFLEX MICROSCOPIC
Bilirubin Urine: NEGATIVE
Glucose, UA: NEGATIVE mg/dL
Hgb urine dipstick: NEGATIVE
Ketones, ur: NEGATIVE mg/dL
Leukocytes,Ua: NEGATIVE
Nitrite: NEGATIVE
Protein, ur: NEGATIVE mg/dL
Specific Gravity, Urine: 1.014 (ref 1.005–1.030)
pH: 5 (ref 5.0–8.0)

## 2021-04-18 LAB — TROPONIN I (HIGH SENSITIVITY): Troponin I (High Sensitivity): 5 ng/L (ref <18)

## 2021-04-18 MED ORDER — PREDNISONE 20 MG PO TABS: ORAL_TABLET | ORAL | 0 refills | Status: DC

## 2021-04-18 MED ORDER — IPRATROPIUM-ALBUTEROL 0.5-2.5 (3) MG/3ML IN SOLN
3.0000 mL | Freq: Once | RESPIRATORY_TRACT | Status: AC
  Administered 2021-04-18: 3 mL via RESPIRATORY_TRACT
  Filled 2021-04-18: qty 3

## 2021-04-18 MED ORDER — PREDNISONE 20 MG PO TABS
60.0000 mg | ORAL_TABLET | Freq: Once | ORAL | Status: AC
Start: 2021-04-18 — End: 2021-04-18
  Administered 2021-04-18: 60 mg via ORAL
  Filled 2021-04-18: qty 3

## 2021-04-18 NOTE — ED Provider Notes (Signed)
[facility] Provider Note    Event Date/Time   First MD Initiated Contact with Patient 04/18/21 0005     (approximate)   History   Generalized Body Aches and Headache   HPI  History obtained via patient and his daughter who interprets for him  Jonathan Willis is a 76 y.o. male who presents to the ED from home with a chief complaint of generalized body aches, headache, occasional chest pain and shortness of breath.  Reports he was given Lasix by his cardiologist 2 weeks ago and has been feeling generally weak since that time.  Endorses chronic dry cough.  Daughter reports history of COPD and notes occasional wheezing.  Denies recent fever, chills, abdominal pain, nausea, vomiting, dysuria or diarrhea.  Denies sick contacts.     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   History of TB (tuberculosis)    Hypertension    Lower GI bleed      Active Problem List   Patient Active Problem List   Diagnosis Date Noted   Influenza A 02/20/2021   Sepsis ([place]) 02/20/2021   Diabetes mellitus without complication ([place])    History of TB (tuberculosis)    Aortic atherosclerosis ([place]) 09/22/2020   COPD exacerbation ([place]) 07/10/2019   Carotid stenosis 07/01/2017   BPH (benign prostatic hyperplasia) 03/23/2017   Chronic respiratory failure ([place]) 01/21/2017   Chronic right shoulder pain 01/16/2017   Essential hypertension 01/16/2017   Type 2 diabetes mellitus with other specified complication ([place]) 00/17/4944   Dyslipidemia associated with type 2 diabetes mellitus ([place]) 01/16/2017   Centrilobular emphysema ([place]) 01/16/2017     Past Surgical History   Past Surgical History:  Procedure Laterality Date   COLONOSCOPY WITH PROPOFOL N/A 04/10/2017   Procedure:  COLONOSCOPY WITH PROPOFOL;  Surgeon: [name], MD;  Location: [facility] ENDOSCOPY;  Service: Gastroenterology;  Laterality: N/A;   NO PAST SURGERIES       Home Medications   Prior to Admission medications   Medication Sig Start Date End Date Taking? Authorizing Provider  predniSONE (DELTASONE) 20 MG tablet 3 tablets daily x 4 days 04/18/21  Yes Paulette Blanch, MD  Accu-Chek Softclix Lancets lancets USE TO TEST ONCE DAILY 09/22/20   Jearld Fenton, NP  albuterol (VENTOLIN HFA) 108 (90 Base) MCG/ACT inhaler Inhale 2 puffs into the lungs every 6 (six) hours as needed for wheezing or shortness of breath. 11/11/20   Jearld Fenton, NP  Blood Glucose Monitoring Suppl (ACCU-CHEK AVIVA CONNECT) w/Device KIT 1 Device by Does not apply route daily. Dx E11.4 09/22/20   Jearld Fenton, NP  Fluticasone-Umeclidin-Vilant (TRELEGY ELLIPTA) 100-62.5-25 MCG/INH AEPB Inhale 1 puff into the lungs daily. 04/16/20   Malfi, Lupita Raider, FNP  glucose blood (ACCU-CHEK AVIVA PLUS) test strip USE 1 TEST STRIP TO TEST BLOOD SUGAR DAILY 09/22/20   Jearld Fenton, NP  guaiFENesin (ROBITUSSIN) 100 MG/5ML liquid Take 10 mLs by mouth every 4 (four) hours as needed for cough or to loosen phlegm. 02/22/21   Fritzi Mandes, MD  mirabegron ER (MYRBETRIQ) 25 MG TB24 tablet Take 1 tablet (25 mg total) by mouth daily. 11/04/20   Debroah Loop, PA-C  Spacer/Aero-Hold Chamber Bags MISC 1 Device by Does not apply route every 6 (six)  hours as needed (with inhaler). 01/10/17   Mikey College, NP  telmisartan (MICARDIS) 40 MG tablet Take 1 tablet (40 mg total) by mouth daily. 09/01/20   Jearld Fenton, NP     Allergies  Patient has no known allergies.   Family History   Family History  Problem Relation Age of Onset   COPD Neg Hx    Diabetes Mellitus II Neg Hx    Hypertension Neg Hx      Physical Exam  Triage Vital Signs: ED Triage Vitals  Enc Vitals Group     BP 04/17/21 2109 133/68     Pulse Rate 04/17/21 2109 75      Resp 04/17/21 2109 17     Temp 04/17/21 2109 98.5 F (36.9 C)     Temp Source 04/17/21 2109 Oral     SpO2 04/17/21 2109 98 %     Weight 04/17/21 2110 105 lb (47.6 kg)     Height 04/17/21 2110 $RemoveBefor'5\' 1"'pkbgKyoblnYd$  (1.549 m)     Head Circumference --      Peak Flow --      Pain Score 04/17/21 2110 8     Pain Loc --      Pain Edu? --      Excl. in [place]? --     Updated Vital Signs: BP (!) 132/57 (BP Location: Right Arm)    Pulse 85    Temp (!) 97.5 F (36.4 C) (Oral)    Resp 20    Ht $R'5\' 1"'dF$  (1.549 m)    Wt 47.6 kg    SpO2 98%    BMI 19.84 kg/m    General: Awake, no distress.  CV:  RRR.  Good peripheral perfusion.  Resp:  Normal effort.  Diminished aeration bibasilarly. Abd:  Nontender to light or deep palpation.  No distention.  Other:  No BLE swelling.   ED Results / Procedures / Treatments  Labs (all labs ordered are listed, but only abnormal results are displayed) Labs Reviewed  CBC - Abnormal; Notable for the following components:      Result Value   RBC 3.96 (*)    Hemoglobin 12.6 (*)    HCT 37.8 (*)    All other components within normal limits  BASIC METABOLIC PANEL - Abnormal; Notable for the following components:   Glucose, Bld 135 (*)    All other components within normal limits  URINALYSIS, ROUTINE W REFLEX MICROSCOPIC - Abnormal; Notable for the following components:   Color, Urine YELLOW (*)    APPearance CLEAR (*)    All other components within normal limits  RESP PANEL BY RT-PCR (FLU A&B, COVID) [id number]  TROPONIN I (HIGH SENSITIVITY)  TROPONIN I (HIGH SENSITIVITY)     EKG  ED ECG REPORT I, Dezire Turk J, the attending physician, personally viewed and interpreted this ECG.   Date: 04/18/2021  EKG Time: 2122  Rate: 69  Rhythm: normal sinus rhythm  Axis: Normal  Intervals:none  ST&T Change: Nonspecific    RADIOLOGY I have personally reviewed patient's x-ray as well as the radiology interpretation:  Chest x-ray: No acute cardiopulmonary process  Official  radiology report(s): DG Chest 1 View  Result Date: 04/17/2021 CLINICAL DATA:  Generalized body aches, headache EXAM: CHEST  1 VIEW COMPARISON:  02/20/2021 FINDINGS: Heart is normal size. No confluent airspace opacities or effusions. No acute bony abnormality. IMPRESSION: No active disease. Electronically Signed   By: Rolm Baptise M.D.   On: 04/17/2021 21:37   No active  disease per Dr. Ardeen Garland  PROCEDURES:  Critical Care performed: No  Procedures   MEDICATIONS ORDERED IN ED: Medications  predniSONE (DELTASONE) tablet 60 mg (60 mg Oral Given 04/18/21 0052)  ipratropium-albuterol (DUONEB) 0.5-2.5 (3) MG/3ML nebulizer solution 3 mL (3 mLs Nebulization Given 04/18/21 0054)     IMPRESSION / MDM / ASSESSMENT AND PLAN / ED COURSE  I reviewed the triage vital signs and the nursing notes.                             76 year old male presenting with generalized body aches, chest pain, shortness of breath. Differential includes, but is not limited to, viral syndrome, bronchitis including COPD exacerbation, pneumonia, reactive airway disease including asthma, CHF including exacerbation with or without pulmonary/interstitial edema, pneumothorax, ACS, thoracic trauma, and pulmonary embolism.  I have personally reviewed patient's chart and see that he had an echocardiogram on 04/13/2021 at [facility] clinic which demonstrated normal left ventricular systolic function, normal right ventricular systolic function, moderate valvular regurgitation and no valvular stenosis.  Laboratory results demonstrate normal WBC, normal electrolytes, COVID-negative.  Chest x-ray unremarkable.  Will repeat troponin, check urine.  Administer prednisone and DuoNeb for diminished aeration on auscultation.  Will reassess.  Clinical Course as of 04/18/21 0448  Sun Apr 18, 2021  0212 Patient feeling better after nebulizer treatment.  Improved aeration.  Updated patient and daughter of negative repeat troponin and negative UA.  Will  discharge home on prednisone burst and patient will follow up closely with his PCP.  Strict return precautions given.  Both verbalized understanding and agree with plan of care [JS]    Clinical Course User Index [JS] Paulette Blanch, MD     FINAL CLINICAL IMPRESSION(S) / ED DIAGNOSES   Final diagnoses:  SOB (shortness of breath)  Chronic obstructive pulmonary disease with acute exacerbation ([place])  Body aches     Rx / DC Orders   ED Discharge Orders          Ordered    predniSONE (DELTASONE) 20 MG tablet        04/18/21 0213             Note:  This document was prepared using Dragon voice recognition software and may include unintentional dictation errors.   Paulette Blanch, MD 04/18/21 [phone]

## 2021-04-18 NOTE — Discharge Instructions (Signed)
Take steroid as prescribed (Prednisone 60 mg daily x4 days). You may use your inhaler every 4 hours as needed for difficulty breathing.  Return to the ER for worsening symptoms, persistent vomiting, difficulty breathing or other concerns.

## 2021-04-19 ENCOUNTER — Other Ambulatory Visit: Payer: Self-pay

## 2021-04-19 ENCOUNTER — Ambulatory Visit
Admission: RE | Admit: 2021-04-19 | Discharge: 2021-04-19 | Disposition: A | Discharge status: Home / Self Care | Payer: Medicare Other | Source: Ambulatory Visit | Attending: Pulmonary Disease | Admitting: Pulmonary Disease

## 2021-04-19 DIAGNOSIS — R0602 Shortness of breath: Secondary | ICD-10-CM | POA: Diagnosis not present

## 2021-04-19 DIAGNOSIS — J439 Emphysema, unspecified: Secondary | ICD-10-CM | POA: Diagnosis not present

## 2021-04-19 DIAGNOSIS — J449 Chronic obstructive pulmonary disease, unspecified: Secondary | ICD-10-CM | POA: Diagnosis not present

## 2021-04-19 DIAGNOSIS — R911 Solitary pulmonary nodule: Secondary | ICD-10-CM | POA: Diagnosis not present

## 2021-04-19 IMAGING — CT CT CHEST W/ CM
2 of 4 series · 15 of 36 positions shown, 18 images · IV contrast (agent unspecified)
Comparison: Previous studies including chest radiograph done on
[DATE] and CT chest done on [DATE]

CLINICAL DATA: Shortness of breath

EXAM:
CT CHEST WITH CONTRAST
TECHNIQUE: Multidetector CT imaging of the chest was performed during
intravenous contrast administration.

[Series 3: axial chest 2.00 · axial · 0.54mm/px · z∈[-1172,-912]mm · 12 of 154 slices shown, 15 images]
[im 12/154  mediastinal]
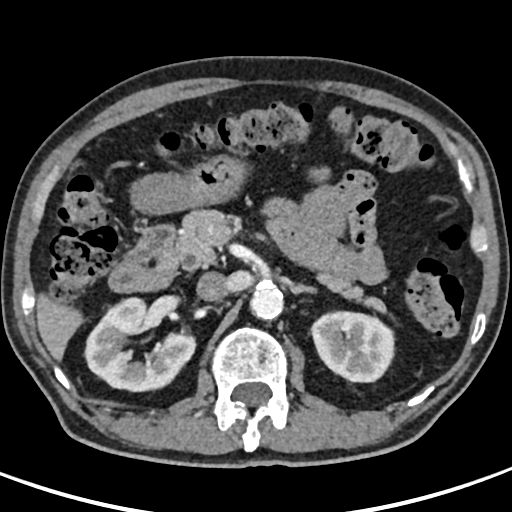
[im 12/154  lung]
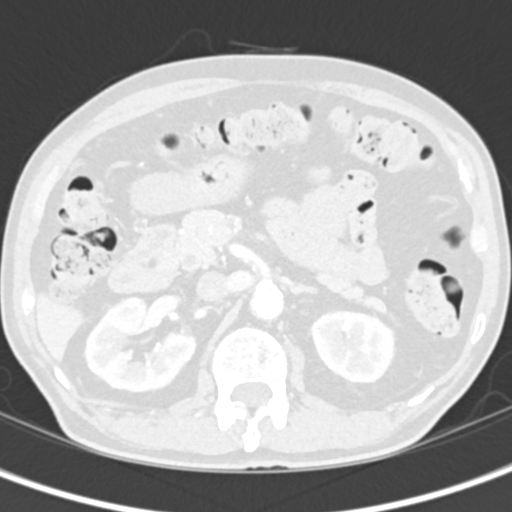
[im 24/154  lung]
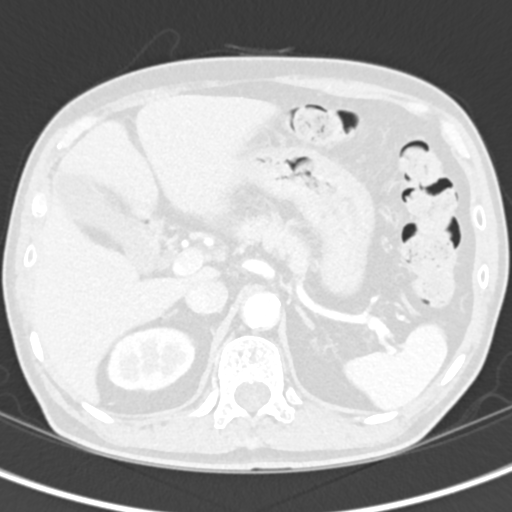
[im 36/154  lung]
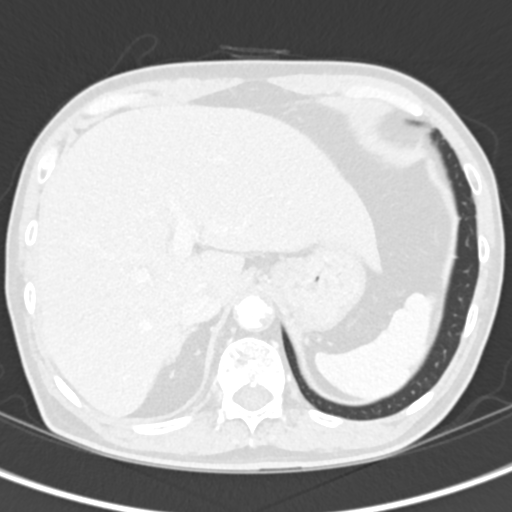
[im 48/154  lung]
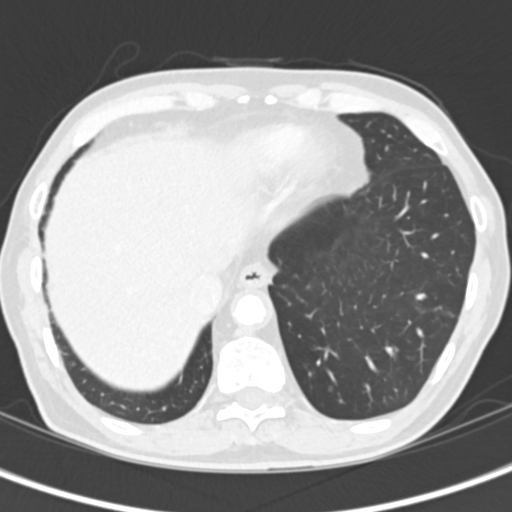
[im 59/154  mediastinal]
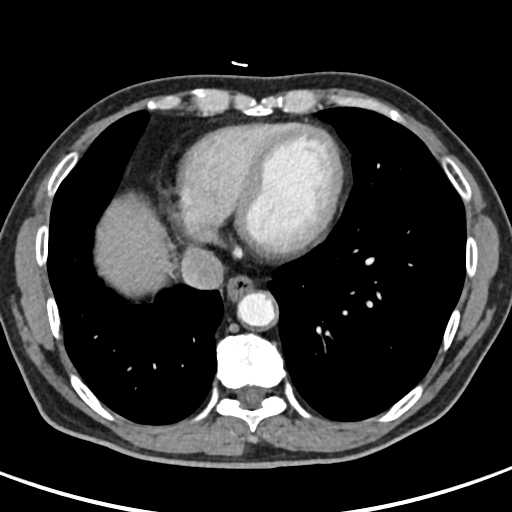
[im 59/154  lung]
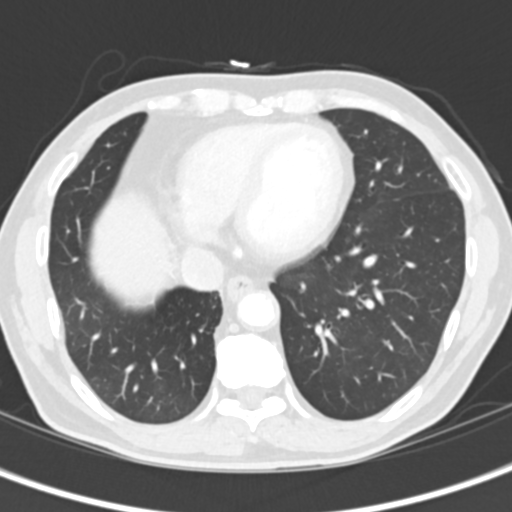
[im 71/154  lung]
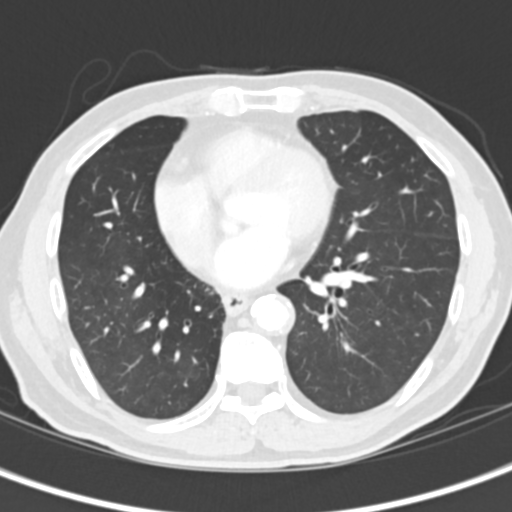
[im 83/154  lung]
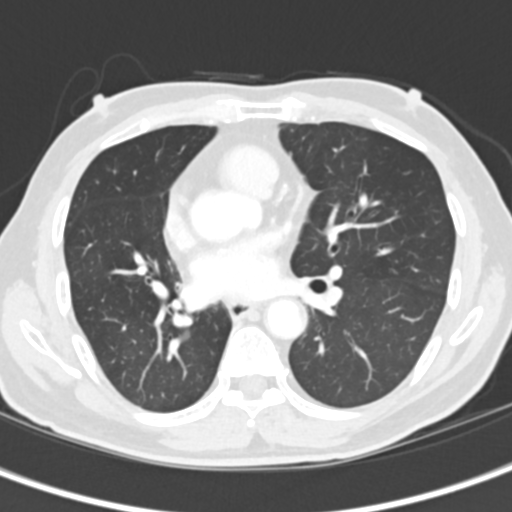
[im 95/154  lung]
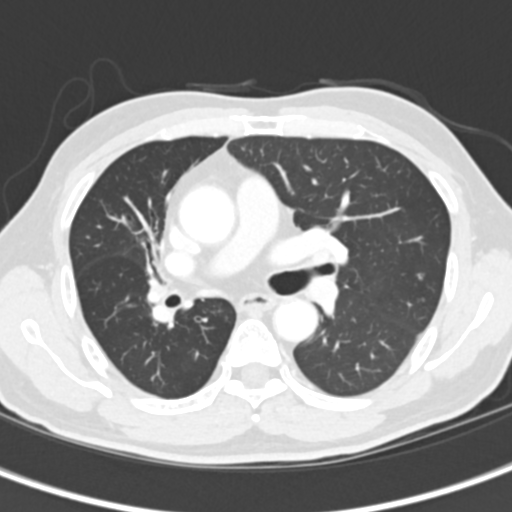
[im 106/154  mediastinal]
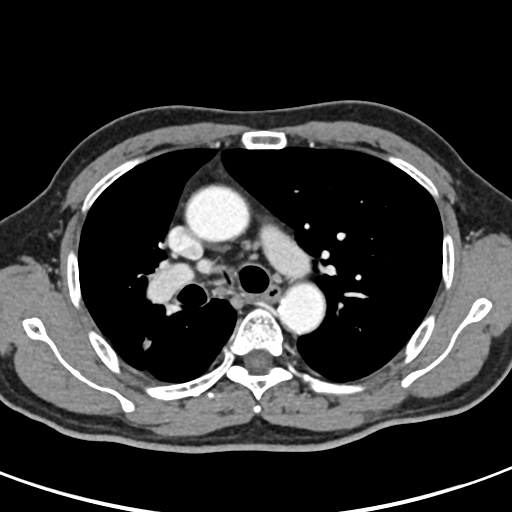
[im 106/154  lung]
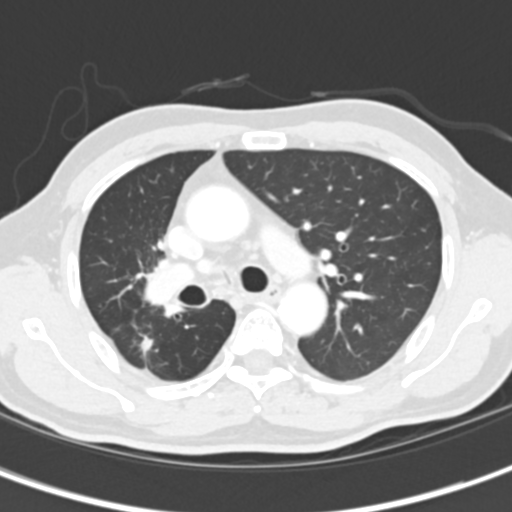
[im 118/154  lung]
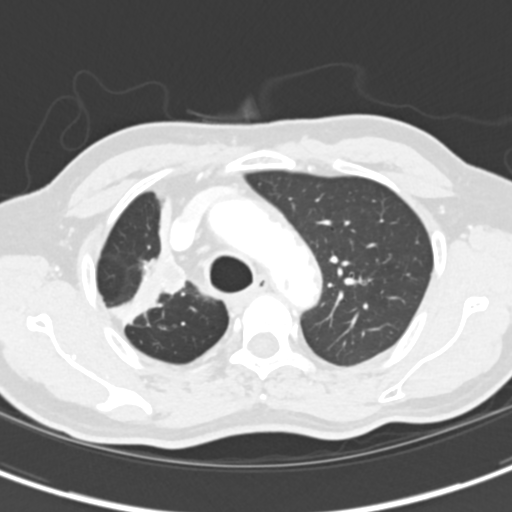
[im 130/154  lung]
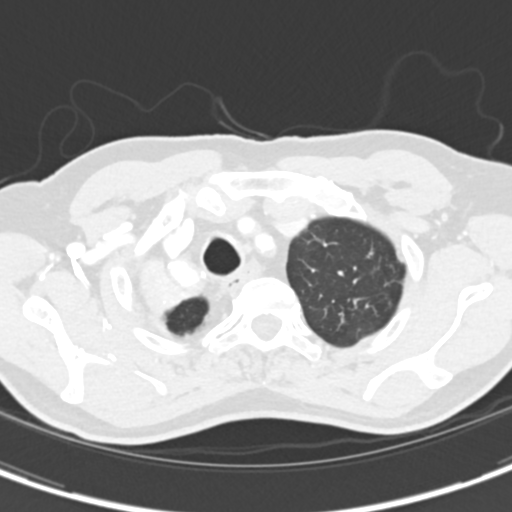
[im 142/154  lung]
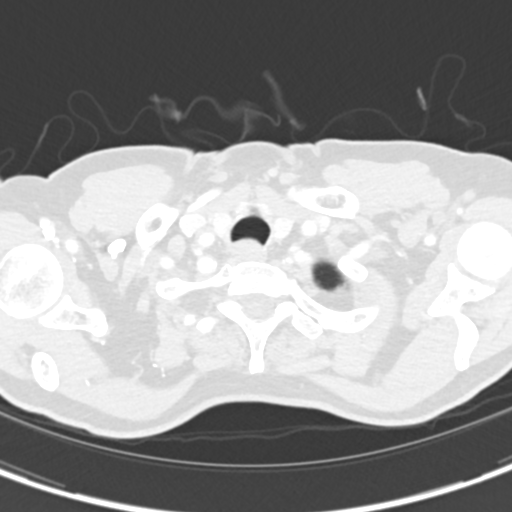

[Series 5: coronal chest 2.00 cor · coronal · 0.54mm/px · 3 of 113 slices shown]
[im 23/113  lung]
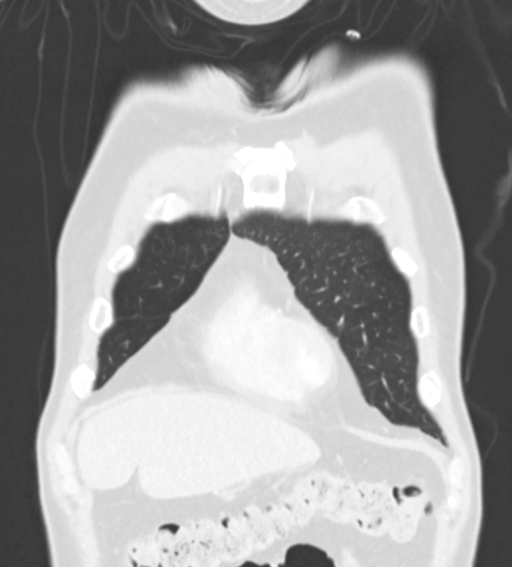
[im 45/113  lung]
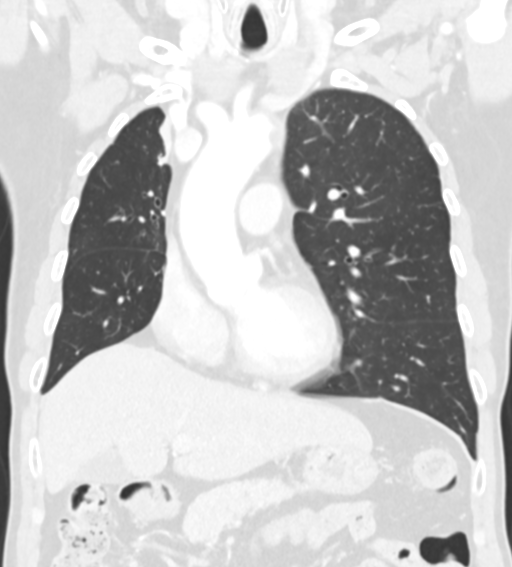
[im 68/113  lung]
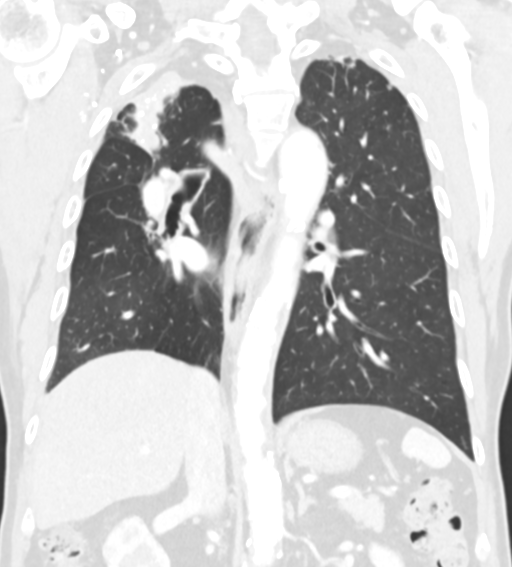

[15 of 36 positions shown; findings below may reference images not displayed]

RADIATION DOSE REDUCTION: This exam was performed according to the
departmental dose-optimization program which includes automated
exposure control, adjustment of the mA and/or kV according to
patient size and/or use of iterative reconstruction technique.

CONTRAST:  75mL OMNIPAQUE IOHEXOL 300 MG/ML  SOLN
FINDINGS: Cardiovascular: Scattered coronary artery calcifications are seen.
Atherosclerotic changes are noted in thoracic aorta with coarse
calcifications and soft plaques. There is homogeneous enhancement in
thoracic aorta without definite demonstrable intimal flap. Central
pulmonary arteries are unremarkable.

Mediastinum/Nodes: No new significant lymphadenopathy seen.

Lungs/Pleura: There is decreased volume in right lung suggesting
previous partial resection. There is a calcified granuloma in the
right upper lung fields. There is linear density with mixed
attenuation and coarse calcifications in the medial right upper lung
fields with no significant interval change. There are small linear
densities with calcifications in the left apex, possibly scarring.
No new focal pulmonary consolidation is seen. Centrilobular
emphysema is seen.

Upper Abdomen: No focal abnormality is seen in the visualized
portions of liver and spleen. There is 10 mm exophytic nodule in the
anterior margin of midportion of right kidney with density
measurements suggestive of a solid lesion. There is possible 10 mm
cyst in the posterior midportion of right kidney.

Musculoskeletal: Unremarkable.
IMPRESSION: There is previous partial resection of right lung and chronic
changes. There is no new focal infiltrate. No significant
lymphadenopathy seen. Marked atherosclerotic changes are noted in
thoracic aorta. There are scattered coronary artery calcifications.

There is 10 mm exophytic lesion with density measurements higher
than usual for a cyst in the anterior midportion of right kidney.
Possibility of malignant neoplasm such as renal cell carcinoma is
not excluded. Follow-up renal sonogram and multiphasic CT should be
considered.

These results will be called to the ordering clinician or
representative by the Radiologist Assistant, and communication
documented in the PACS or [REDACTED].

## 2021-04-19 MED ORDER — IOHEXOL 300 MG/ML  SOLN
75.0000 mL | Freq: Once | INTRAMUSCULAR | Status: AC | PRN
  Administered 2021-04-19: 75 mL via INTRAVENOUS

## 2021-04-20 ENCOUNTER — Telehealth: Payer: Self-pay

## 2021-04-20 DIAGNOSIS — E1169 Type 2 diabetes mellitus with other specified complication: Secondary | ICD-10-CM

## 2021-04-20 MED ORDER — ACCU-CHEK AVIVA PLUS VI STRP: ORAL_STRIP | 3 refills | Status: DC

## 2021-04-20 NOTE — Addendum Note (Signed)
Addended by: Wilson Singer on: 04/20/2021 05:30 PM   Modules accepted: Orders

## 2021-04-20 NOTE — Telephone Encounter (Signed)
Pt  is requesting  a refill on   Accu Chek   Aviva  Plus called into  [facility]  [address] .... [name] call  back is [phone]

## 2021-04-20 NOTE — Telephone Encounter (Signed)
Does he need a new meter or just strips and lancets?

## 2021-05-13 ENCOUNTER — Emergency Department: Payer: Medicare Other

## 2021-05-13 ENCOUNTER — Other Ambulatory Visit: Payer: Self-pay

## 2021-05-13 ENCOUNTER — Encounter: Payer: Self-pay | Admitting: Emergency Medicine

## 2021-05-13 ENCOUNTER — Emergency Department
Admission: EM | Admit: 2021-05-13 | Discharge: 2021-05-13 | Disposition: A | Discharge status: Home / Self Care | Payer: Medicare Other | Attending: Student in an Organized Health Care Education/Training Program | Admitting: Student in an Organized Health Care Education/Training Program

## 2021-05-13 DIAGNOSIS — G319 Degenerative disease of nervous system, unspecified: Secondary | ICD-10-CM | POA: Diagnosis not present

## 2021-05-13 DIAGNOSIS — R42 Dizziness and giddiness: Secondary | ICD-10-CM | POA: Insufficient documentation

## 2021-05-13 DIAGNOSIS — Z20822 Contact with and (suspected) exposure to covid-19: Secondary | ICD-10-CM | POA: Diagnosis not present

## 2021-05-13 DIAGNOSIS — R0602 Shortness of breath: Secondary | ICD-10-CM | POA: Diagnosis not present

## 2021-05-13 DIAGNOSIS — J431 Panlobular emphysema: Secondary | ICD-10-CM

## 2021-05-13 DIAGNOSIS — J441 Chronic obstructive pulmonary disease with (acute) exacerbation: Secondary | ICD-10-CM | POA: Diagnosis not present

## 2021-05-13 LAB — CBC WITH DIFFERENTIAL/PLATELET
Abs Immature Granulocytes: 0.01 10*3/uL (ref 0.00–0.07)
Basophils Absolute: 0 10*3/uL (ref 0.0–0.1)
Basophils Relative: 1 %
Eosinophils Absolute: 0.4 10*3/uL (ref 0.0–0.5)
Eosinophils Relative: 7 %
HCT: 42.4 % (ref 39.0–52.0)
Hemoglobin: 13.8 g/dL (ref 13.0–17.0)
Immature Granulocytes: 0 %
Lymphocytes Relative: 39 %
Lymphs Abs: 2.5 10*3/uL (ref 0.7–4.0)
MCH: 31.1 pg (ref 26.0–34.0)
MCHC: 32.5 g/dL (ref 30.0–36.0)
MCV: 95.5 fL (ref 80.0–100.0)
Monocytes Absolute: 0.4 10*3/uL (ref 0.1–1.0)
Monocytes Relative: 6 %
Neutro Abs: 3.1 10*3/uL (ref 1.7–7.7)
Neutrophils Relative %: 47 %
Platelets: 290 10*3/uL (ref 150–400)
RBC: 4.44 MIL/uL (ref 4.22–5.81)
RDW: 13.5 % (ref 11.5–15.5)
WBC: 6.4 10*3/uL (ref 4.0–10.5)
nRBC: 0 % (ref 0.0–0.2)

## 2021-05-13 LAB — COMPREHENSIVE METABOLIC PANEL
ALT: 15 U/L (ref 0–44)
AST: 20 U/L (ref 15–41)
Albumin: 4.1 g/dL (ref 3.5–5.0)
Alkaline Phosphatase: 61 U/L (ref 38–126)
Anion gap: 9 (ref 5–15)
BUN: 13 mg/dL (ref 8–23)
CO2: 24 mmol/L (ref 22–32)
Calcium: 9.1 mg/dL (ref 8.9–10.3)
Chloride: 104 mmol/L (ref 98–111)
Creatinine, Ser: 0.83 mg/dL (ref 0.61–1.24)
GFR, Estimated: >60 mL/min (ref >60)
Glucose, Bld: 132 mg/dL — ABNORMAL HIGH (ref 70–99)
Potassium: 3.5 mmol/L (ref 3.5–5.1)
Sodium: 137 mmol/L (ref 135–145)
Total Bilirubin: 0.6 mg/dL (ref 0.3–1.2)
Total Protein: 7.5 g/dL (ref 6.5–8.1)

## 2021-05-13 LAB — BLOOD GAS, VENOUS
Acid-Base Excess: 4.2 mmol/L — ABNORMAL HIGH (ref 0.0–2.0)
Bicarbonate: 31.1 mmol/L — ABNORMAL HIGH (ref 20.0–28.0)
O2 Saturation: 50 %
Patient temperature: 37
pCO2, Ven: 55 mmHg (ref 44–60)
pH, Ven: 7.36 (ref 7.25–7.43)

## 2021-05-13 LAB — URINALYSIS, ROUTINE W REFLEX MICROSCOPIC
Bilirubin Urine: NEGATIVE
Glucose, UA: NEGATIVE mg/dL
Hgb urine dipstick: NEGATIVE
Ketones, ur: NEGATIVE mg/dL
Leukocytes,Ua: NEGATIVE
Nitrite: NEGATIVE
Protein, ur: NEGATIVE mg/dL
Specific Gravity, Urine: 1.011 (ref 1.005–1.030)
pH: 7 (ref 5.0–8.0)

## 2021-05-13 LAB — TROPONIN I (HIGH SENSITIVITY)
Troponin I (High Sensitivity): 4 ng/L (ref <18)
Troponin I (High Sensitivity): 5 ng/L (ref <18)

## 2021-05-13 LAB — CBG MONITORING, ED: Glucose-Capillary: 113 mg/dL — ABNORMAL HIGH (ref 70–99)

## 2021-05-13 LAB — RESP PANEL BY RT-PCR (FLU A&B, COVID) ARPGX2
Influenza A by PCR: NEGATIVE
Influenza B by PCR: NEGATIVE
SARS Coronavirus 2 by RT PCR: NEGATIVE

## 2021-05-13 IMAGING — CT CT HEAD W/O CM
4 series · 17 of 47 positions shown, 19 images · non-contrast
Comparison: [DATE]

CLINICAL DATA: Dizziness.  Shortness of breath.



[Series 2: head bone · axial · 0.44mm/px · z∈[-147,-91]mm · 4 of 79 slices shown]
[im 8/79  bone]
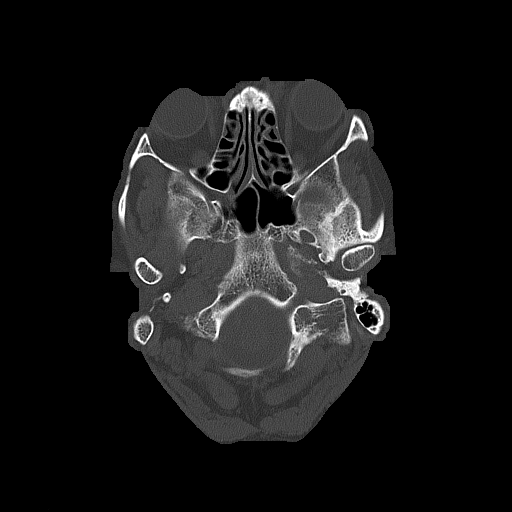
[im 16/79  bone]
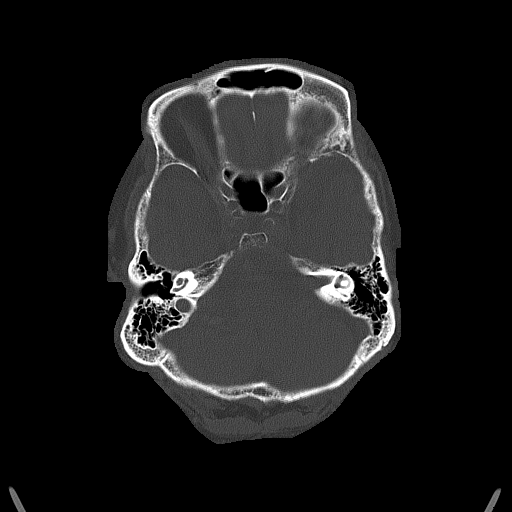
[im 24/79  bone]
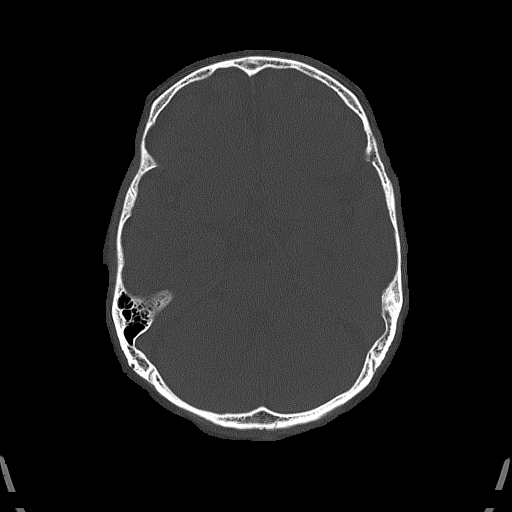
[im 36/79  bone]
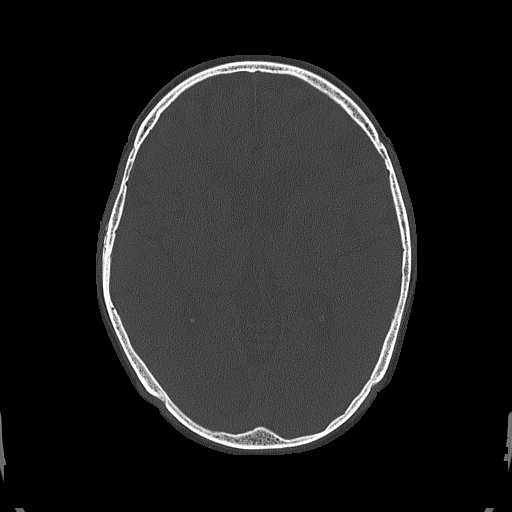

[Series 3: head wo · axial · 0.41mm/px · z∈[-146,-26]mm · 7 of 32 slices shown, 9 images]
[im 4/32  brain]
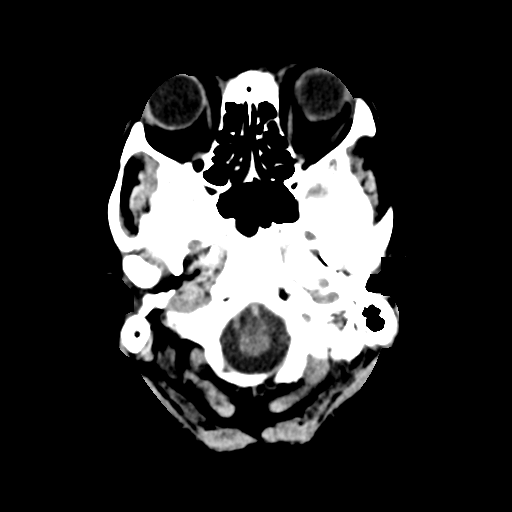
[im 4/32  bone]
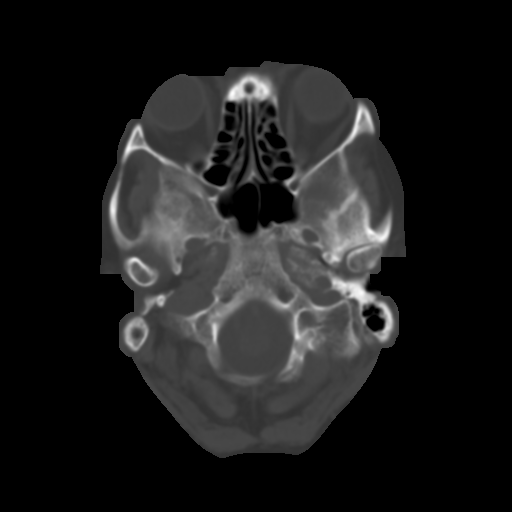
[im 8/32  brain]
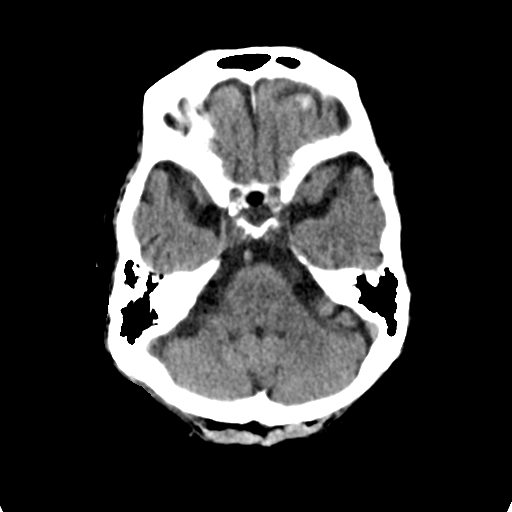
[im 12/32  brain]
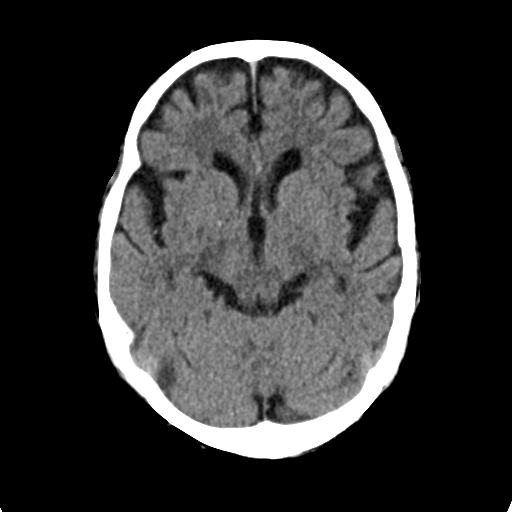
[im 16/32  brain]
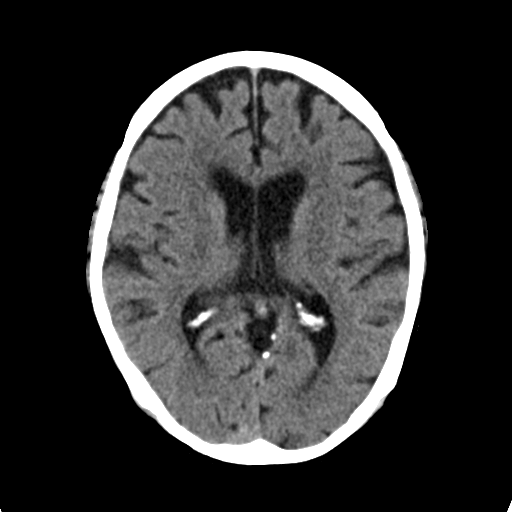
[im 20/32  brain]
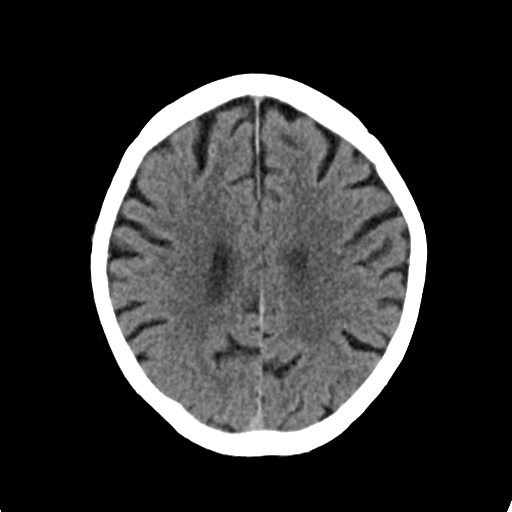
[im 20/32  bone]
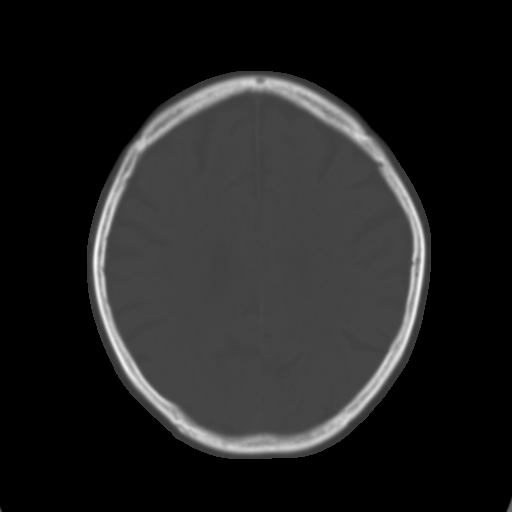
[im 24/32  brain]
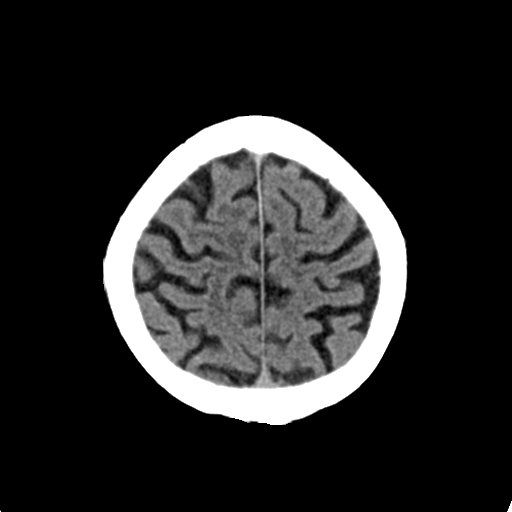
[im 28/32  brain]
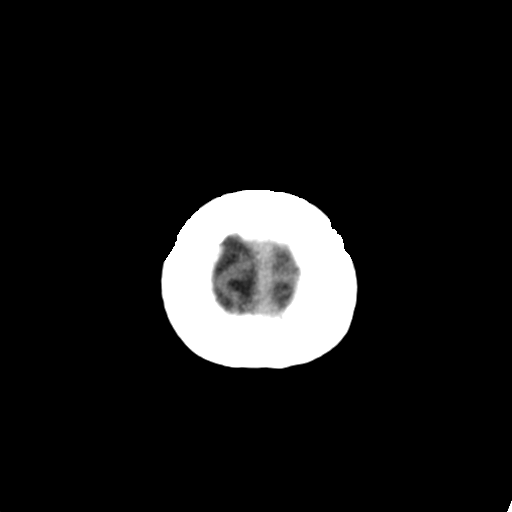

[Series 4: coronal soft tissue · coronal · 0.33mm/px · 3 of 63 slices shown]
[im 21/63  brain]
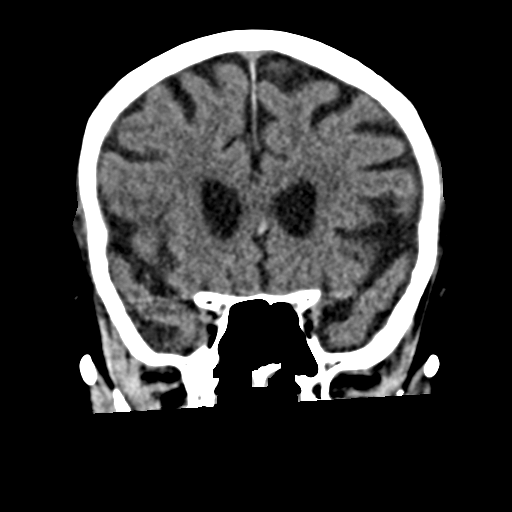
[im 28/63  brain]
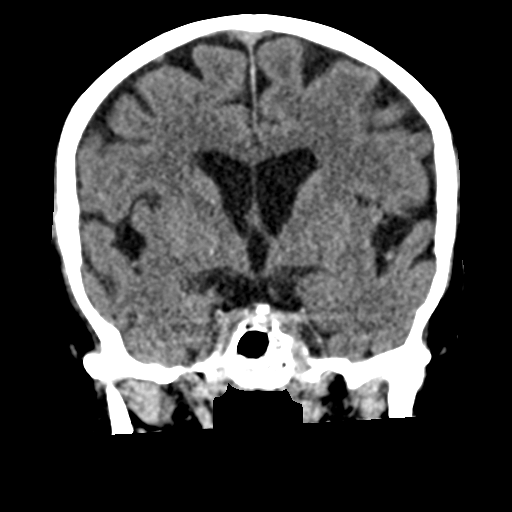
[im 35/63  brain]
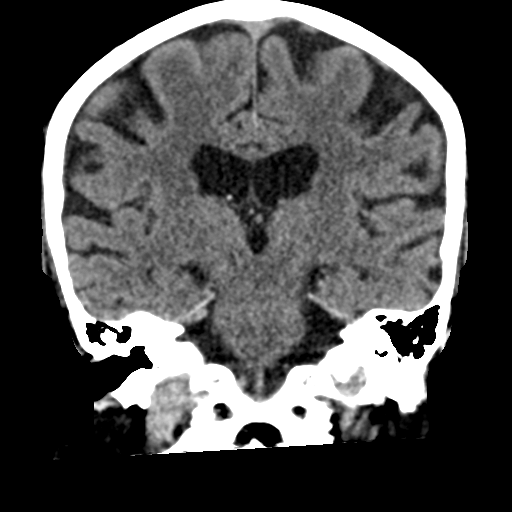

[Series 5: sagittal soft tissue · sagittal · 0.33mm/px · 3 of 51 slices shown]
[im 17/51  brain]
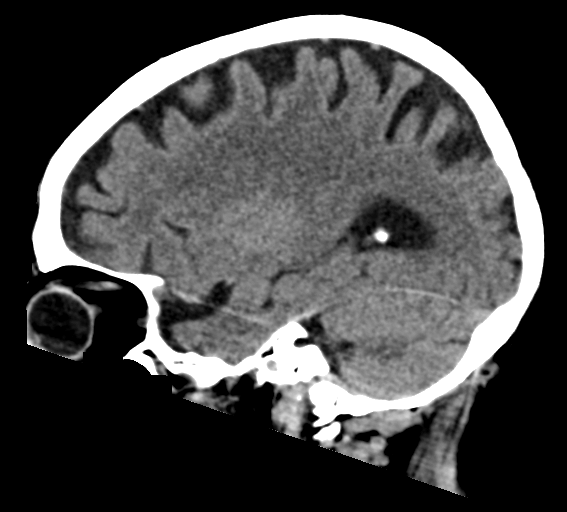
[im 26/51  brain]
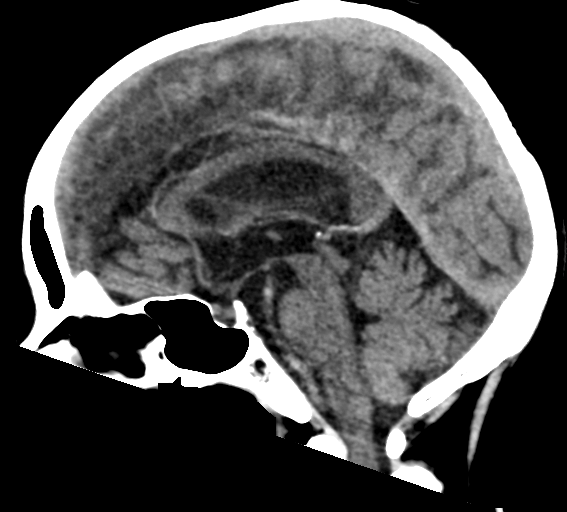
[im 34/51  brain]
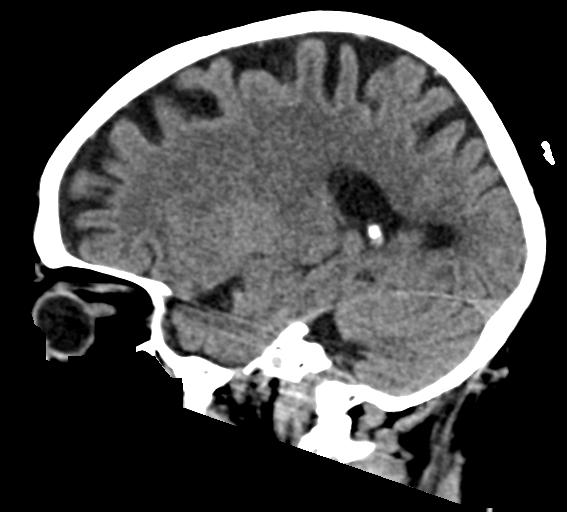

[17 of 47 positions shown; findings below may reference images not displayed]

FINDINGS: Brain: No evidence of acute infarction, hemorrhage, hydrocephalus,
extra-axial collection or mass lesion/mass effect. There is mild
diffuse low-attenuation within the subcortical and periventricular
white matter compatible with chronic microvascular disease.
Prominence of sulci and ventricles compatible with brain atrophy.

Vascular: No hyperdense vessel or unexpected calcification.

Skull: Normal. Negative for fracture or focal lesion.

Sinuses/Orbits: Mastoid air cells and paranasal sinuses are clear.

Other: None.
IMPRESSION: 1. No acute intracranial abnormalities.
2. Chronic small vessel ischemic disease and brain atrophy.

## 2021-05-13 IMAGING — MR MR HEAD W/O CM
14 series · 45 of 48 positions shown · non-contrast
Comparison: Prior head CT examinations [DATE] and earlier. CTA
head/neck [DATE].

CLINICAL DATA: Provided history: Neuro deficit, acute, stroke
suspected. Additional history provided: Dizziness and shortness of
breath this morning.

EXAM:
MRI HEAD WITHOUT CONTRAST
TECHNIQUE: Multiplanar, multiecho pulse sequences of the brain and surrounding
structures were obtained without intravenous contrast.

[Series 5: ax dwi_tracew · axial · 3.0mm · 0.65mm/px · z∈[-131,+20]mm · 3 of 48 slices shown]
[im 1/48]
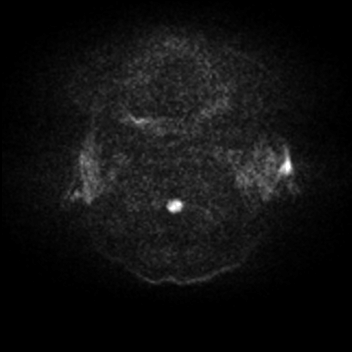
[im 24/48]
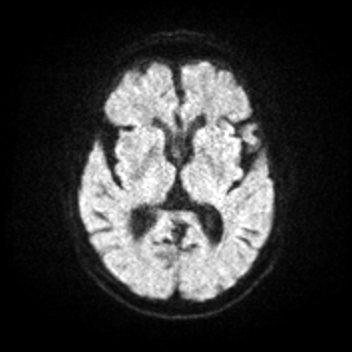
[im 48/48]
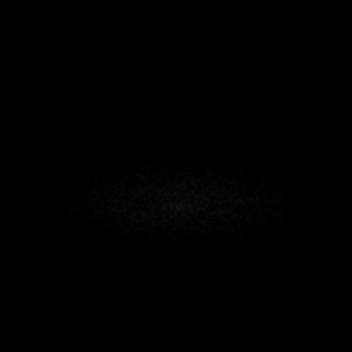

[Series 6: ax dwi_adc · axial · 3.0mm · 0.65mm/px · z∈[-131,+17]mm · 3 of 47 slices shown]
[im 1/47]
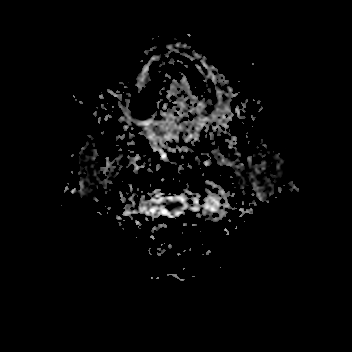
[im 24/47]
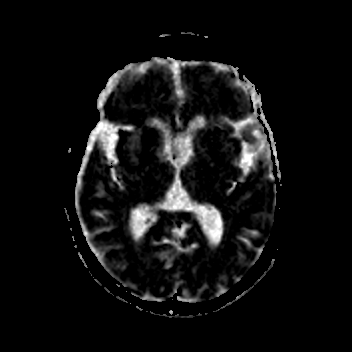
[im 47/47]
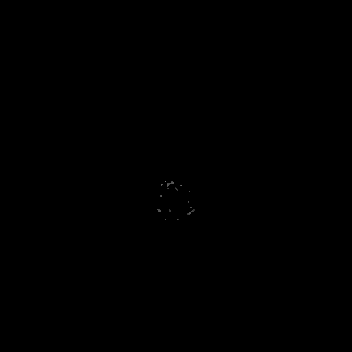

[Series 7: cor dwi_tracew · coronal · 5.0mm · 0.68mm/px · 3 of 38 slices shown]
[im 1/38]
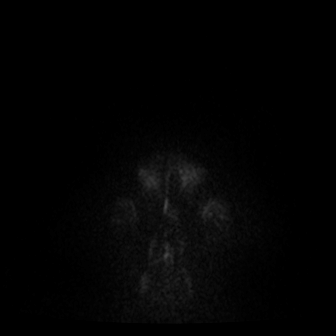
[im 19/38]
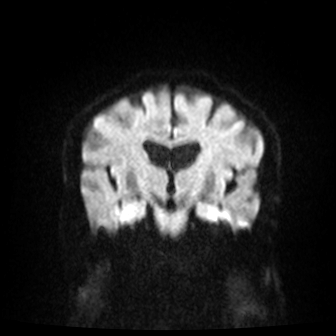
[im 38/38]
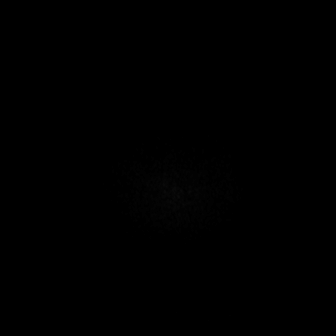

[Series 8: cor dwi_adc · coronal · 5.0mm · 0.68mm/px · 3 of 37 slices shown]
[im 1/37]
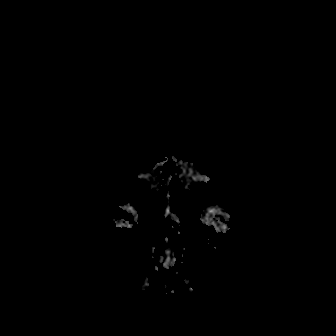
[im 19/37]
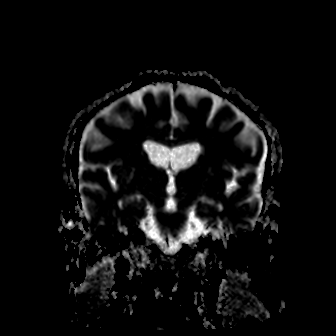
[im 37/37]
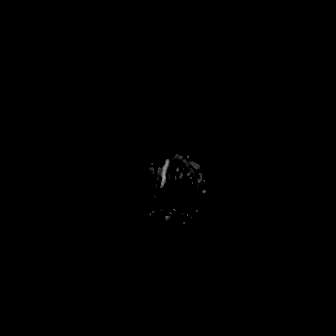

[Series 9: T1 · sagittal · 5.0mm · 0.62mm/px · 2 of 22 slices shown (1 of 2)]
[im 1/22]
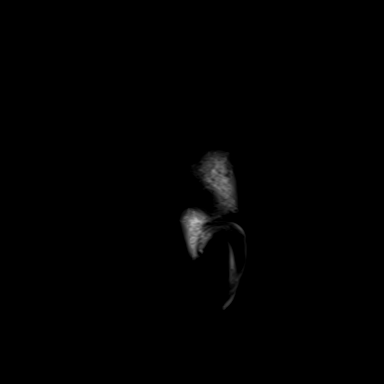
[im 22/22]
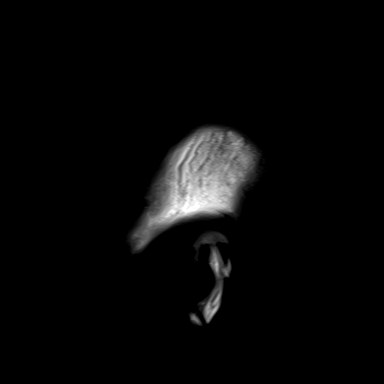

[Series 11: DWI · axial · 3.0mm · 0.71mm/px · z∈[-133,+27]mm · 4 of 56 slices shown (1 of 2)]
[im 1/56]
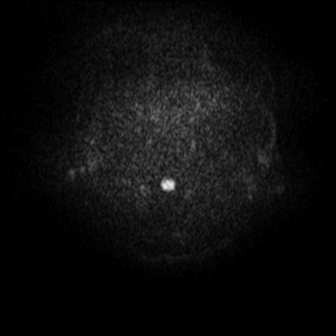
[im 19/56]
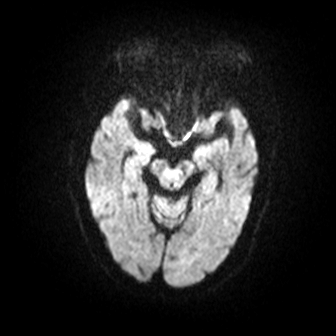
[im 37/56]
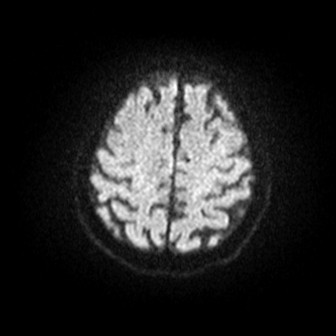
[im 56/56]
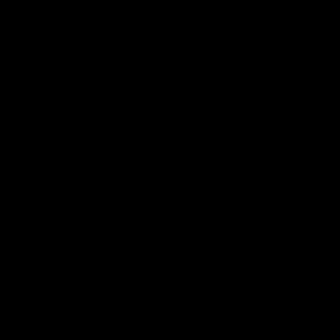

[Series 12: DWI · axial · 3.0mm · 0.71mm/px · z∈[-133,+10]mm · 4 of 50 slices shown (2 of 2)]
[im 1/50]
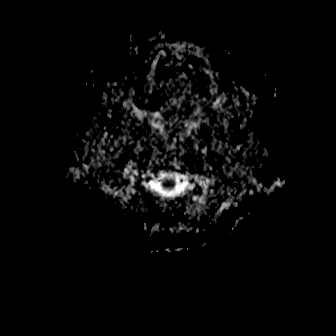
[im 17/50]
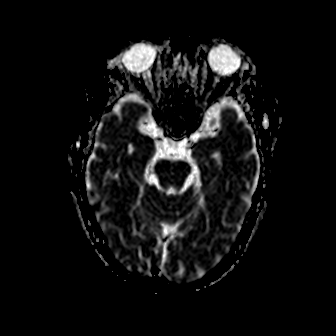
[im 33/50]
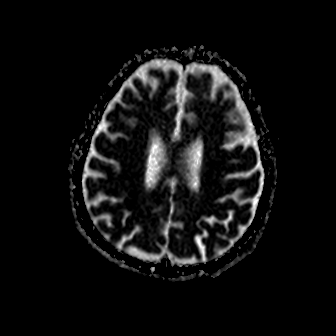
[im 50/50]
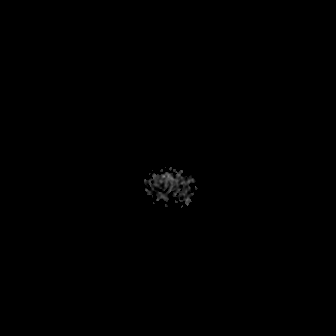

[Series 13: mag_images · axial · 3.0mm · 0.90mm/px · z∈[-140,+32]mm · 5 of 60 slices shown]
[im 1/60]
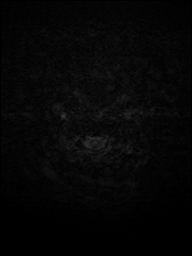
[im 15/60]
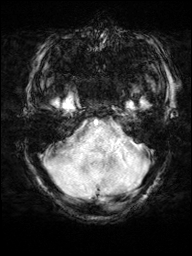
[im 30/60]
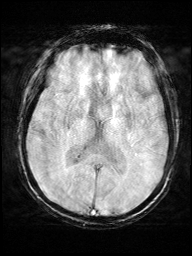
[im 45/60]
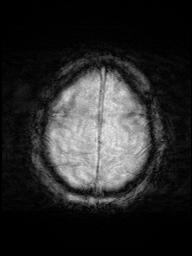
[im 60/60]
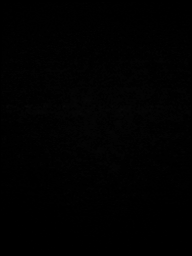

[Series 14: pha_images · axial · 3.0mm · 0.90mm/px · z∈[-137,+17]mm · 4 of 54 slices shown]
[im 1/54]
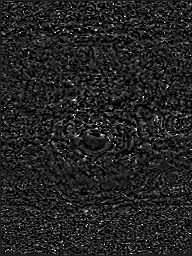
[im 18/54]
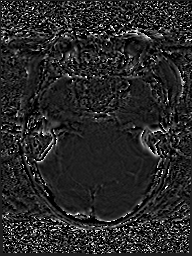
[im 36/54]
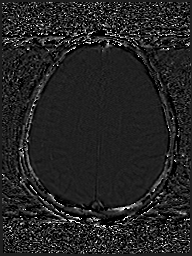
[im 54/54]
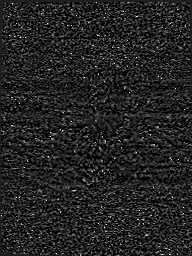

[Series 15: swi_images · axial · 3.0mm · 0.90mm/px · z∈[-140,-99]mm · 2 of 60 slices shown]
[im 1/60]
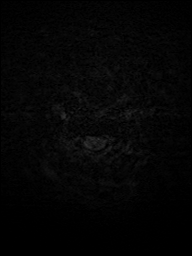
[im 15/60]
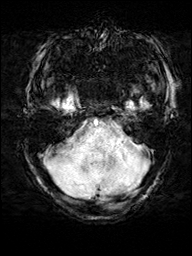

[Series 17: T1 · axial · 3.0mm · 0.90mm/px · z∈[-133,+24]mm · 4 of 55 slices shown (2 of 2)]
[im 1/55]
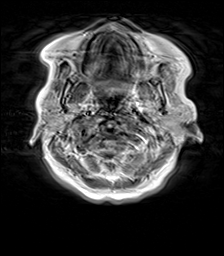
[im 19/55]
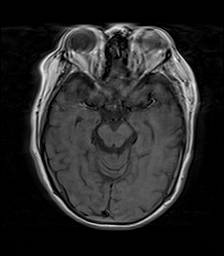
[im 37/55]
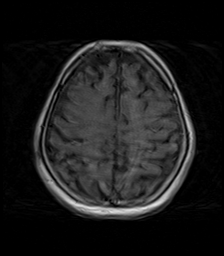
[im 55/55]
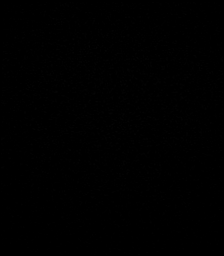

[Series 18: FLAIR · axial · 3.0mm · 0.53mm/px · z∈[-131,+26]mm · 4 of 55 slices shown]
[im 1/55]
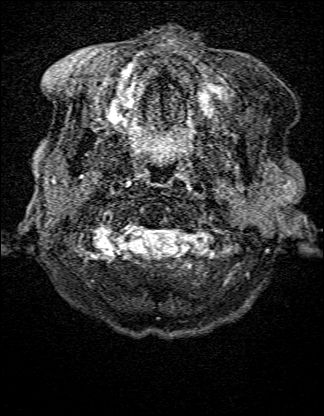
[im 19/55]
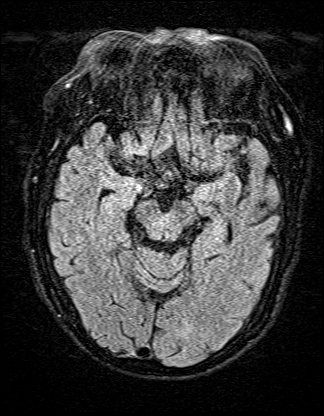
[im 37/55]
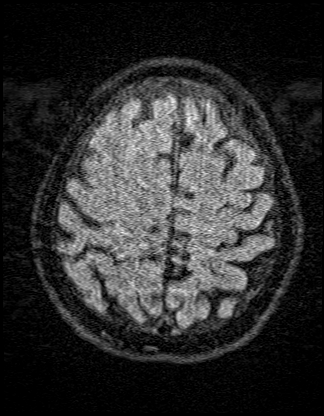
[im 55/55]
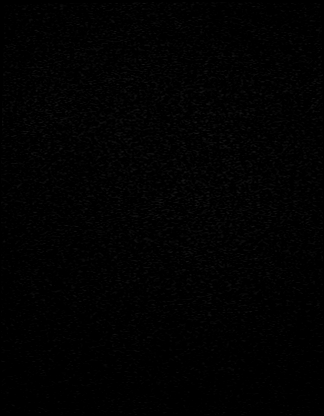

[Series 19: T2 · coronal · 5.0mm · 0.45mm/px · 2 of 31 slices shown (1 of 2)]
[im 1/31]
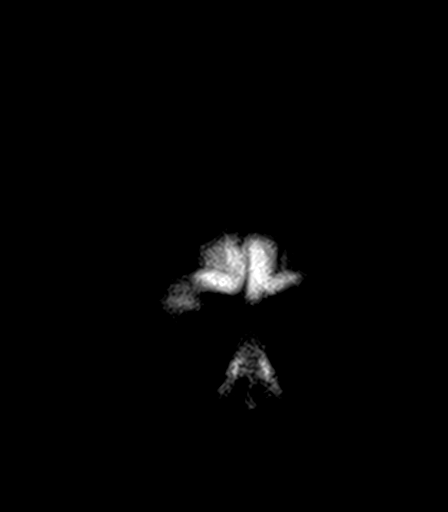
[im 31/31]
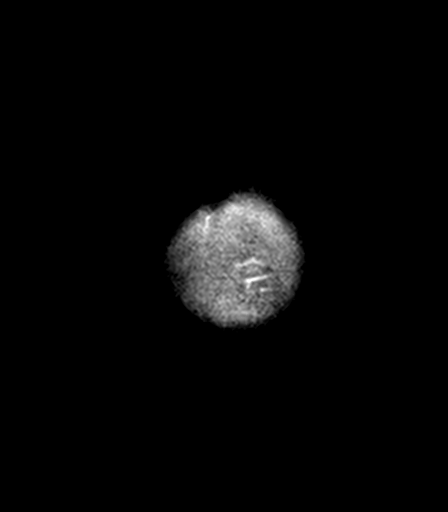

[Series 20: T2 · axial · 5.0mm · 0.45mm/px · z∈[-128,+11]mm · 2 of 25 slices shown (2 of 2)]
[im 1/25]
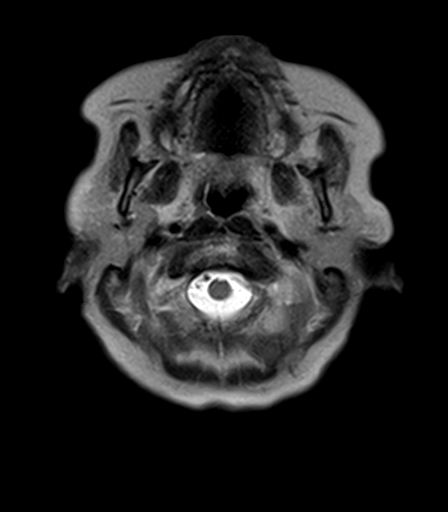
[im 25/25]
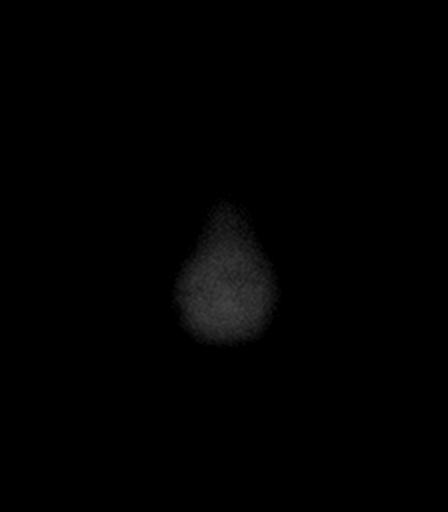

[45 of 48 positions shown; findings below may reference images not displayed]

FINDINGS: Intermittently motion degraded examination, limiting evaluation.
Most notably, there is moderate/severe motion degradation of the
axial T1-weighted sequence and moderate/severe motion degradation of
the axial T2 FLAIR sequence.

Brain:

Mild generalized cerebral and cerebellar atrophy.

Mild multifocal T2 FLAIR hyperintense signal abnormality within the
cerebral white matter, nonspecific but compatible with chronic small
vessel ischemic disease.

There is no acute infarct.

No evidence of an intracranial mass.

No chronic intracranial blood products.

No extra-axial fluid collection.

No midline shift.

Vascular: Maintained flow voids within the proximal large arterial
vessels.

Skull and upper cervical spine: No focal suspicious marrow lesion.
Incompletely assessed cervical spondylosis.

Sinuses/Orbits: Visualized orbits show no acute finding. Trace
mucosal thickening within the left maxillary and bilateral ethmoid
sinuses.
IMPRESSION: Intermittently motion degraded examination, as described.

No evidence of acute intracranial abnormality.

Mild chronic small vessel ischemic changes within the cerebral white
matter.

Mild generalized cerebral and cerebellar atrophy.

## 2021-05-13 MED ORDER — IPRATROPIUM-ALBUTEROL 0.5-2.5 (3) MG/3ML IN SOLN
3.0000 mL | Freq: Once | RESPIRATORY_TRACT | Status: AC
Start: 2021-05-13 — End: 2021-05-13
  Administered 2021-05-13: 3 mL via RESPIRATORY_TRACT
  Filled 2021-05-13: qty 3

## 2021-05-13 MED ORDER — IPRATROPIUM-ALBUTEROL 0.5-2.5 (3) MG/3ML IN SOLN
3.0000 mL | Freq: Once | RESPIRATORY_TRACT | Status: AC
  Administered 2021-05-13: 3 mL via RESPIRATORY_TRACT
  Filled 2021-05-13: qty 3

## 2021-05-13 MED ORDER — SODIUM CHLORIDE 0.9 % IV BOLUS
500.0000 mL | Freq: Once | INTRAVENOUS | Status: AC
Start: 2021-05-13 — End: 2021-05-13
  Administered 2021-05-13: 500 mL via INTRAVENOUS

## 2021-05-13 MED ORDER — SODIUM CHLORIDE 0.9 % IV BOLUS
500.0000 mL | Freq: Once | INTRAVENOUS | Status: AC
  Administered 2021-05-13: 500 mL via INTRAVENOUS

## 2021-05-13 MED ORDER — MECLIZINE HCL 12.5 MG PO TABS: 12.5000 mg | ORAL_TABLET | Freq: Two times a day (BID) | ORAL | 0 refills | Status: DC | PRN

## 2021-05-13 MED ORDER — METHYLPREDNISOLONE SODIUM SUCC 125 MG IJ SOLR
125.0000 mg | Freq: Once | INTRAMUSCULAR | Status: AC
  Administered 2021-05-13: 125 mg via INTRAVENOUS
  Filled 2021-05-13: qty 2

## 2021-05-13 MED ORDER — MECLIZINE HCL 25 MG PO TABS
25.0000 mg | ORAL_TABLET | Freq: Once | ORAL | Status: AC
  Administered 2021-05-13: 25 mg via ORAL
  Filled 2021-05-13: qty 1

## 2021-05-13 MED ORDER — PREDNISONE 20 MG PO TABS: 40.0000 mg | ORAL_TABLET | Freq: Every day | ORAL | 0 refills | Status: AC

## 2021-05-13 NOTE — ED Notes (Signed)
Pt ambulated in the room, 97% RA. Pt became very dizzy while walking, pt back in bed.

## 2021-05-13 NOTE — ED Provider Notes (Signed)
[facility] Provider Note    Event Date/Time   First MD Initiated Contact with Patient 05/13/21 0840     (approximate)   History   Dizziness   HPI  Jonathan Willis is a 76 y.o. male who presents to the ER for evaluation of dizziness shortness of breath and chest discomfort for the past 24 hours.  Woke up feeling more lightheaded and like the room was spinning.  Does have a history of COPD.  Denies any cough or congestion.  He denies any abdominal pain.  No lower extremity swelling.  No numbness or tingling.  No headache.     Physical Exam   Triage Vital Signs: ED Triage Vitals  Enc Vitals Group     BP 05/13/21 0639 (!) 166/79     Pulse Rate 05/13/21 0639 69     Resp 05/13/21 0639 20     Temp 05/13/21 0639 98 F (36.7 C)     Temp src --      SpO2 05/13/21 0639 100 %     Weight 05/13/21 0639 105 lb (47.6 kg)     Height 05/13/21 0639 5\' 1"  (1.549 m)     Head Circumference --      Peak Flow --      Pain Score 05/13/21 0639 0     Pain Loc --      Pain Edu? --      Excl. in [place]? --     Most recent vital signs: Vitals:   05/13/21 0639 05/13/21 0900  BP: (!) 166/79 (!) 163/82  Pulse: 69 68  Resp: 20 17  Temp: 98 F (36.7 C)   SpO2: 100% 98%     Constitutional: Alert  Eyes: Conjunctivae are normal.  Head: Atraumatic. Nose: No congestion/rhinnorhea. Mouth/Throat: Mucous membranes are moist.   Neck: Painless ROM.  Cardiovascular:   Good peripheral circulation. Respiratory: mild tachypnea with inspiratory and expiratory wheeze Gastrointestinal: Soft and nontender.  Musculoskeletal:  no deformity Neurologic:  MAE spontaneously. No gross focal neurologic deficits are appreciated.  Skin:  Skin is warm, dry and intact. No rash noted. Psychiatric: Mood and affect are normal. Speech and behavior are normal.    ED Results / Procedures / Treatments   Labs (all labs ordered are listed, but only abnormal results are displayed) Labs Reviewed   COMPREHENSIVE METABOLIC PANEL - Abnormal; Notable for the following components:      Result Value   Glucose, Bld 132 (*)    All other components within normal limits  URINALYSIS, ROUTINE W REFLEX MICROSCOPIC - Abnormal; Notable for the following components:   Color, Urine STRAW (*)    APPearance CLEAR (*)    All other components within normal limits  CBG MONITORING, ED - Abnormal; Notable for the following components:   Glucose-Capillary 113 (*)    All other components within normal limits  CBC WITH DIFFERENTIAL/PLATELET  BLOOD GAS, VENOUS  TROPONIN I (HIGH SENSITIVITY)  TROPONIN I (HIGH SENSITIVITY)     EKG  ED ECG REPORT I, Merlyn Lot, the attending physician, personally viewed and interpreted this ECG.   Date: 05/13/2021  EKG Time: 6:42  Rate: 70  Rhythm: sinus  Axis: normal  Intervals: normal  ST&T Change: no stemi, nonspecific st abn, unchanged from previous    RADIOLOGY Please see ED Course for my review and interpretation.  I personally reviewed all radiographic images ordered to evaluate for the above acute complaints and reviewed radiology reports and findings.  These  findings were personally discussed with the patient.  Please see medical record for radiology report.    PROCEDURES:  Critical Care performed: No  Procedures   MEDICATIONS ORDERED IN ED: Medications  ipratropium-albuterol (DUONEB) 0.5-2.5 (3) MG/3ML nebulizer solution 3 mL (has no administration in time range)  ipratropium-albuterol (DUONEB) 0.5-2.5 (3) MG/3ML nebulizer solution 3 mL (has no administration in time range)  sodium chloride 0.9 % bolus 500 mL (has no administration in time range)  methylPREDNISolone sodium succinate (SOLU-MEDROL) 125 mg/2 mL injection 125 mg (has no administration in time range)     IMPRESSION / MDM / ASSESSMENT AND PLAN / ED COURSE  I reviewed the triage vital signs and the nursing notes.                              Differential diagnosis  includes, but is not limited to, electrolyte abnormality, orthostasis, COPD exacerbation, pneumonia, ACS, CVA, pneumonia, sepsis   Patient presenting with symptoms as described above.  He is nontoxic-appearing does have some mild tachypnea and wheezing consistent with COPD.  Has no focal neurodeficits.  He is having some chest discomfort.  Imaging and blood work sent for the above differential.  Have a lower suspicion for ACS but given his age and risk factors will observe on cardiac monitor for serial enzymes will give nebulizer will order Solu-Medrol.   Clinical Course as of 05/13/21 1428  Thu May 13, 2021  1147 Patient very dizzy with ambulation.  Viral panel negative.  Still awaiting VBG.  able to ambulate but some unsteadiness 2/2 dizziness therefore will order meclizine as well as MRI. [PR]  1221 Cardiac enzymes are negative.  UA negative. [PR]  2505 VBG without hypercapnia. [PR]  3976 MRI reviewed and shows no acute abnormality.  Patient states that he feels much better at this time.  Does still have some mild wheezing but no hypoxia even with ambulation.  Discussed option for admission to the hospital for further breathing treatments observation medical work-up given his age and comorbidities.  Patient states that he feels well at this point and feels comfortable going home and prefer to follow-up with his PCP.  Given his otherwise reassuring work-up I think that is a reasonable option.  Patient agrees to return if he has any worsening of symptoms. [PR]    Clinical Course User Index [PR] Merlyn Lot, MD     FINAL CLINICAL IMPRESSION(S) / ED DIAGNOSES   Final diagnoses:  COPD exacerbation ([place])  Dizziness     Rx / DC Orders   ED Discharge Orders     None        Note:  This document was prepared using Dragon voice recognition software and may include unintentional dictation errors.    Merlyn Lot, MD 05/13/21 1430

## 2021-05-13 NOTE — ED Triage Notes (Signed)
Pt to triage via w/c with no distress noted; awoke this morning with c/o dizziness and SHOB; denies pain

## 2021-05-13 NOTE — ED Notes (Signed)
Pt's daughter [name] is on her way to get pt. AVS discussed with daughter. Daughter requested anti dizzy medicine, Dr. Quentin Cornwall will send RX to pharmacy.

## 2021-05-17 ENCOUNTER — Other Ambulatory Visit: Payer: Self-pay

## 2021-05-17 ENCOUNTER — Ambulatory Visit (INDEPENDENT_AMBULATORY_CARE_PROVIDER_SITE_OTHER): Payer: Medicare Other | Admitting: Urology

## 2021-05-17 ENCOUNTER — Encounter: Payer: Self-pay | Admitting: Urology

## 2021-05-17 VITALS — BP 132/66 | HR 85 | Ht 61.0 in | Wt 107.0 lb

## 2021-05-17 DIAGNOSIS — N2889 Other specified disorders of kidney and ureter: Secondary | ICD-10-CM | POA: Diagnosis not present

## 2021-05-18 NOTE — Progress Notes (Signed)
05/17/2021 4:36 PM   Jonathan Willis 03-02-1946 [id number]  Referring provider: Jearld Fenton, NP [place],  [place] [address]  Chief Complaint  Patient presents with   Follow-up    HPI: 76 y.o. male followed for BPH with obstructive/storage elated voiding symptoms on tamsulosin and Myrbetriq.  Presents in follow-up of recent findings on CT performed in the ED.  His daughter who speaks English was present and a [place] interpreter via video link  CT chest with contrast performed 04/19/2021 for evaluation of shortness of breath.  On the lower cuts was noted to have a 10 mm exophytic lesion in the anterior midportion of the right kidney with density measurements suggestive of a solid lesion Stable LUTS Denies dysuria or gross hematuria Denies flank, abdominal or pelvic pain. 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 If you have any questions, ask your nurse or doctor.          Accu-Chek Aviva Connect w/Device Kit 1 Device by Does not apply route daily. Dx E11.4   Accu-Chek Aviva Plus test strip Generic drug: glucose blood USE 1 TEST STRIP TO TEST BLOOD SUGAR DAILY   Accu-Chek Softclix Lancets lancets USE TO TEST ONCE  DAILY   albuterol 108 (90 Base) MCG/ACT inhaler Commonly known as: VENTOLIN HFA Inhale 2 puffs into the lungs every 6 (six) hours as needed for wheezing or shortness of breath.   furosemide 20 MG tablet Commonly known as: LASIX Take 20 mg by mouth daily.   guaiFENesin 100 MG/5ML liquid Commonly known as: ROBITUSSIN Take 10 mLs by mouth every 4 (four) hours as needed for cough or to loosen phlegm.   meclizine 12.5 MG tablet Commonly known as: ANTIVERT Take 1 tablet (12.5 mg total) by mouth 2 (two) times daily as needed for dizziness.   mirabegron ER 25 MG Tb24 tablet Commonly known as: MYRBETRIQ Take 1 tablet (25 mg total) by mouth daily.   predniSONE 20 MG tablet Commonly known as: DELTASONE Take 2 tablets (40 mg total) by mouth daily for 5 days.   Spacer/Aero-Hold Chamber Bags Misc 1 Device by Does not apply route every 6 (six) hours as needed (with inhaler).   telmisartan 40 MG tablet Commonly known as: MICARDIS Take 1 tablet (40 mg total) by mouth daily.   Trelegy Ellipta 100-62.5-25 MCG/ACT Aepb Generic drug: Fluticasone-Umeclidin-Vilant Inhale 1 puff into the lungs daily.        Allergies: No Known Allergies  Family History: Family History  Problem Relation Age of Onset   COPD Neg Hx    Diabetes Mellitus II Neg Hx    Hypertension Neg Hx  Social History:  reports that he quit smoking about 14 years ago. His smoking use included cigarettes. He has a 66.00 pack-year smoking history. He has quit using smokeless tobacco. He reports that he does not drink alcohol and does not use drugs.   Physical Exam: BP 132/66    Pulse 85    Ht $R'5\' 1"'rE$  (1.549 m)    Wt 107 lb (48.5 kg)    BMI 20.22 kg/m   Constitutional:  Alert and oriented, No acute distress. HEENT: [place] AT, moist mucus membranes.  Trachea midline, no masses. Cardiovascular: No clubbing, cyanosis, or edema. Respiratory: Normal respiratory effort, no increased work of breathing. Psychiatric: Normal mood  and affect.    Pertinent Imaging: CT images were personally reviewed and interpreted  Assessment & Plan:    1.  Indeterminate right renal mass Findings were discussed with patient and his daughter Recommend scheduling a renal mass protocol MRI abdomen and they are in agreement with proceeding Will call with results We briefly discussed options for solid renal masses <4 cm in size and that surveillance is an acceptable option.  We will discuss further once MRI performed   Abbie Sons, MD  [facility] [address] [place], [place] [address] [phone]

## 2021-05-23 ENCOUNTER — Encounter: Payer: Self-pay | Admitting: Urology

## 2021-05-25 DIAGNOSIS — J449 Chronic obstructive pulmonary disease, unspecified: Secondary | ICD-10-CM | POA: Diagnosis not present

## 2021-06-02 ENCOUNTER — Ambulatory Visit
Admission: RE | Admit: 2021-06-02 | Discharge: 2021-06-02 | Disposition: A | Discharge status: Home / Self Care | Payer: Medicare Other | Source: Ambulatory Visit | Attending: Urology | Admitting: Urology

## 2021-06-02 DIAGNOSIS — I700 Atherosclerosis of aorta: Secondary | ICD-10-CM | POA: Diagnosis not present

## 2021-06-02 DIAGNOSIS — N2889 Other specified disorders of kidney and ureter: Secondary | ICD-10-CM | POA: Insufficient documentation

## 2021-06-02 DIAGNOSIS — N281 Cyst of kidney, acquired: Secondary | ICD-10-CM | POA: Diagnosis not present

## 2021-06-02 IMAGING — MR MR ABDOMEN WO/W CM
18 series · 48 of 48 positions shown · IV contrast (4ml Gadavist)
Comparison: Chest CT [DATE]. An abdominal CT of [DATE] is
also reviewed.

CLINICAL DATA: CT demonstrating indeterminate right renal mass.

EXAM:
MRI ABDOMEN WITHOUT AND WITH CONTRAST
TECHNIQUE: Multiplanar multisequence MR imaging of the abdomen was performed
both before and after the administration of intravenous contrast.
CONTRAST:  5mL GADAVIST GADOBUTROL 1 MMOL/ML IV SOLN

[Series 3: T2 · coronal · 6.0mm · 1.19mm/px · 2 of 30 slices shown (1 of 2)]
[im 1/30]
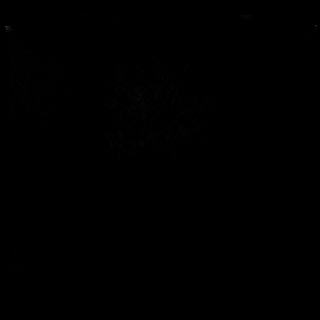
[im 30/30]
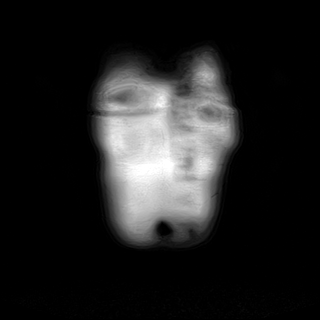

[Series 4: T2 · axial · 6.0mm · 1.19mm/px · z∈[-112,+112]mm · 2 of 32 slices shown (2 of 2)]
[im 1/32]
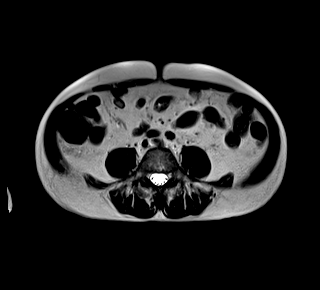
[im 32/32]
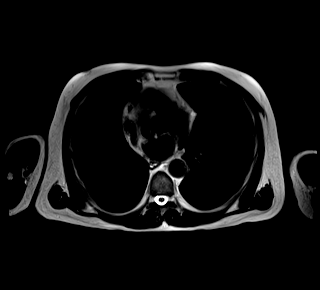

[Series 6: T2 fat-sat · axial · 6.0mm · 1.19mm/px · z∈[-119,+119]mm · 2 of 34 slices shown]
[im 1/34]
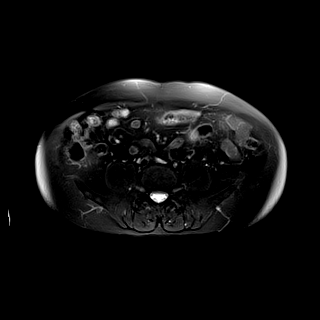
[im 34/34]
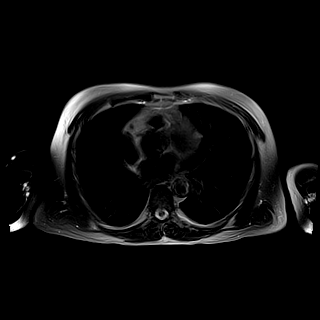

[Series 7: ax dwi_tracew · axial · 6.0mm · 1.42mm/px · z∈[-119,+119]mm · 4 of 102 slices shown]
[im 1/102]
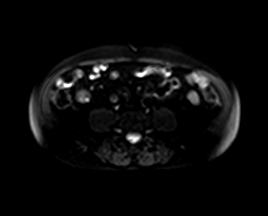
[im 34/102]
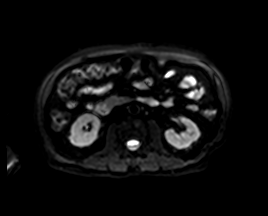
[im 68/102]
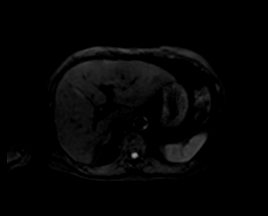
[im 102/102]
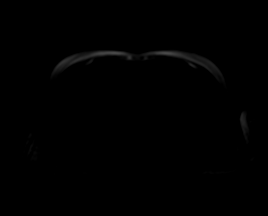

[Series 8: ax dwi_adc · axial · 6.0mm · 1.42mm/px · 1 of 34 slices shown]
[im 1/34]
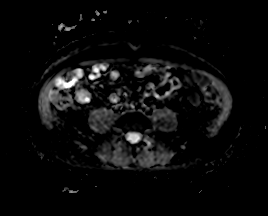

[Series 9: in & out · axial · 3.0mm · 1.19mm/px · z∈[-112,+125]mm · 3 of 80 slices shown (1 of 2)]
[im 1/80]
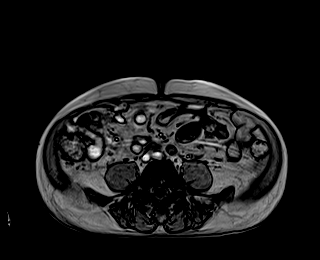
[im 40/80]
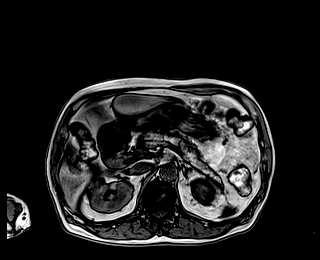
[im 80/80]
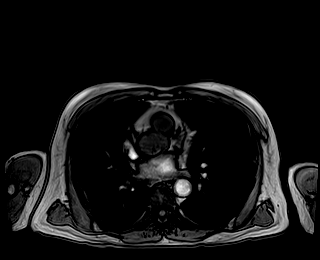

[Series 10: in & out · axial · 3.0mm · 1.19mm/px · z∈[-112,+125]mm · 3 of 80 slices shown (2 of 2)]
[im 1/80]
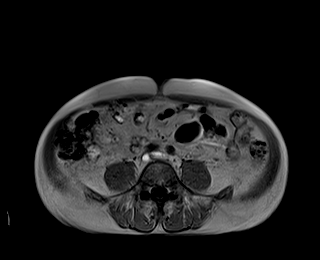
[im 40/80]
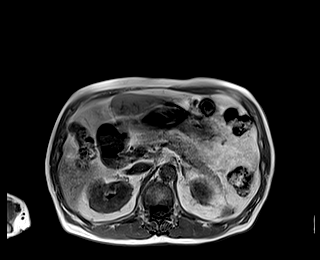
[im 80/80]
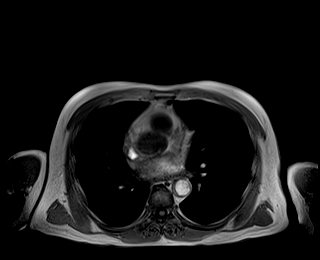

[Series 11: bSSFP · axial · 6.0mm · 0.74mm/px · 1 of 32 slices shown]
[im 1/32]
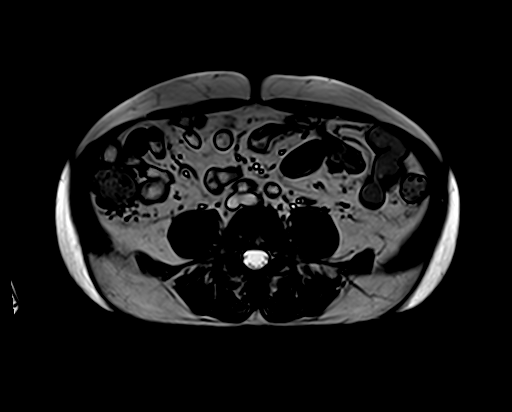

[Series 12: T1 dynamic fat-sat · axial · non-contrast · 3.0mm · 1.19mm/px · z∈[-118,+118]mm · 3 of 80 slices shown (1 of 5)]
[im 1/80]
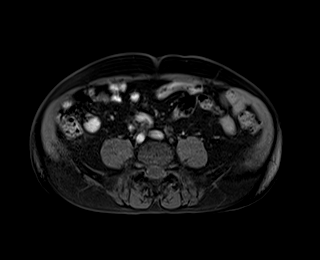
[im 40/80]
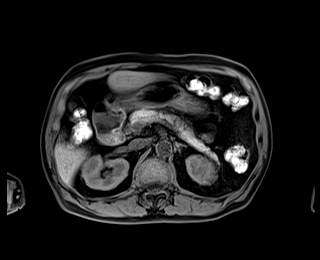
[im 80/80]
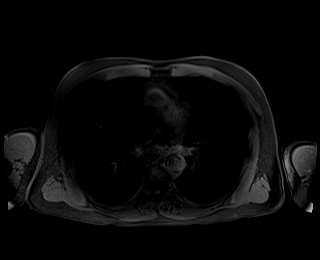

[Series 13: T1 dynamic fat-sat post-contrast · axial · 3.0mm · 1.19mm/px · z∈[-118,+118]mm · 3 of 80 slices shown (1 of 4)]
[im 1/80]
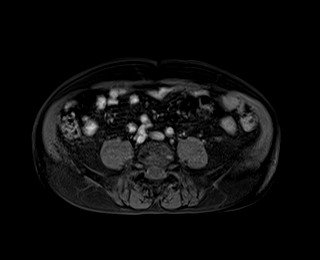
[im 40/80]
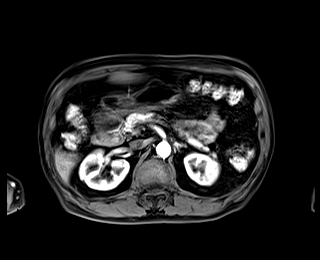
[im 80/80]
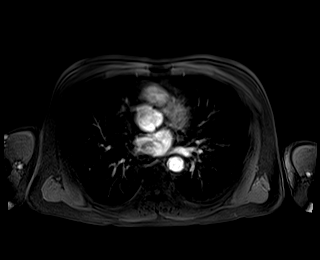

[Series 14: T1 dynamic fat-sat · axial · 3.0mm · 1.19mm/px · z∈[-118,+118]mm · 3 of 80 slices shown (2 of 5)]
[im 1/80]
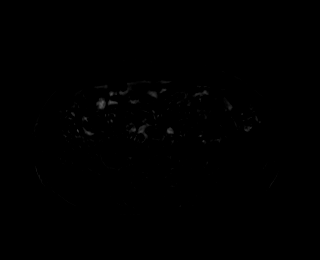
[im 40/80]
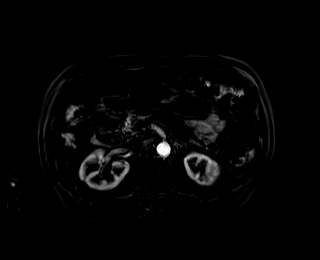
[im 80/80]
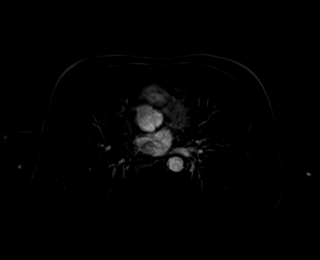

[Series 15: T1 dynamic fat-sat post-contrast · axial · 3.0mm · 1.19mm/px · z∈[-118,+118]mm · 3 of 80 slices shown (2 of 4)]
[im 1/80]
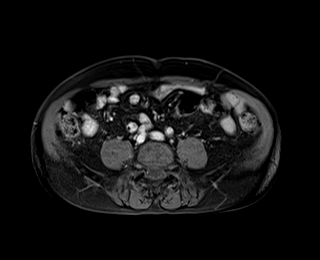
[im 40/80]
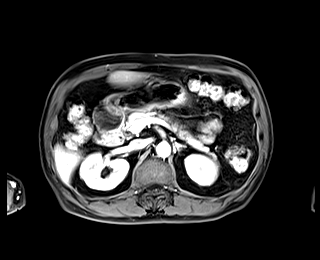
[im 80/80]
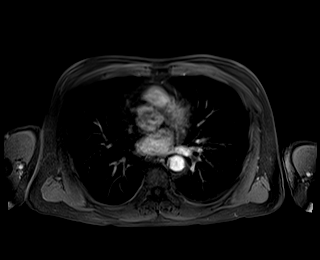

[Series 16: T1 dynamic fat-sat · axial · 3.0mm · 1.19mm/px · z∈[-118,+118]mm · 3 of 80 slices shown (3 of 5)]
[im 1/80]
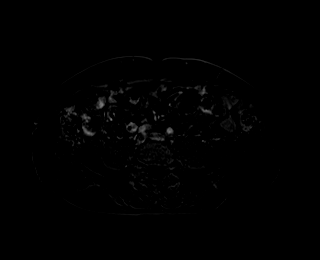
[im 40/80]
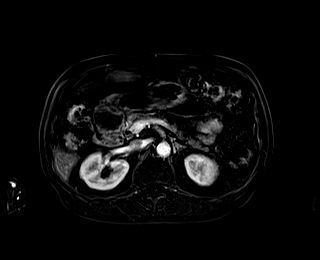
[im 80/80]
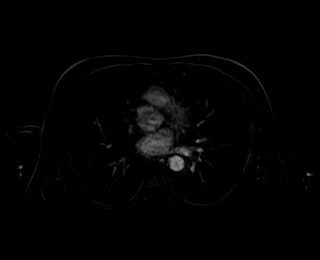

[Series 17: T1 dynamic fat-sat post-contrast · axial · 3.0mm · 1.19mm/px · z∈[-118,+118]mm · 3 of 80 slices shown (3 of 4)]
[im 1/80]
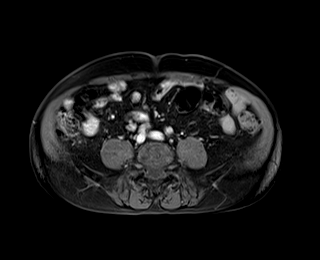
[im 40/80]
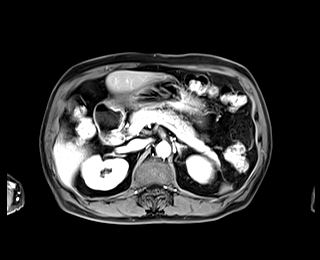
[im 80/80]
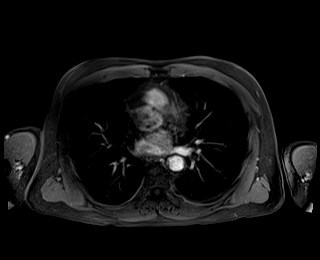

[Series 18: T1 dynamic fat-sat · axial · 3.0mm · 1.19mm/px · z∈[-118,+118]mm · 3 of 80 slices shown (4 of 5)]
[im 1/80]
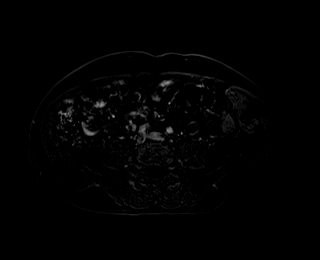
[im 40/80]
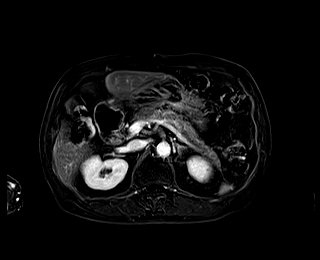
[im 80/80]
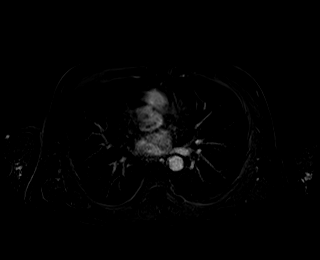

[Series 19: T1 dynamic post-contrast · coronal · 3.0mm · 1.31mm/px · 3 of 72 slices shown]
[im 1/72]
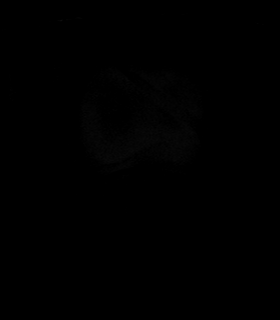
[im 36/72]
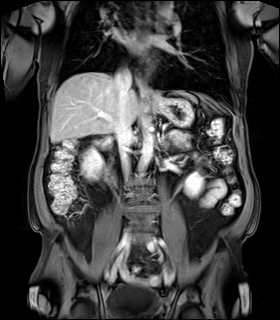
[im 72/72]
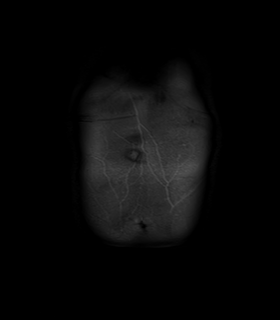

[Series 20: T1 dynamic fat-sat post-contrast · axial · 3.0mm · 1.19mm/px · z∈[-118,+118]mm · 3 of 80 slices shown (4 of 4)]
[im 1/80]
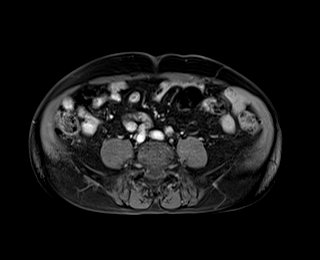
[im 40/80]
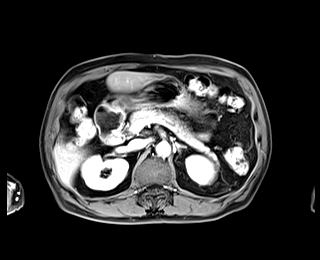
[im 80/80]
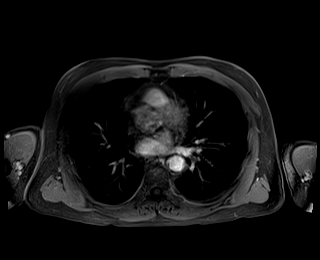

[Series 21: T1 dynamic fat-sat · axial · 3.0mm · 1.19mm/px · z∈[-118,+118]mm · 3 of 80 slices shown (5 of 5)]
[im 1/80]
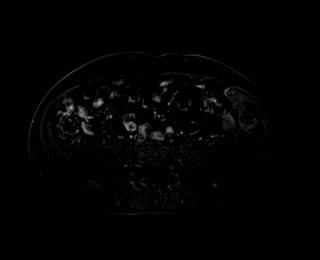
[im 40/80]
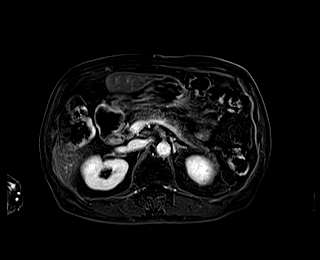
[im 80/80]
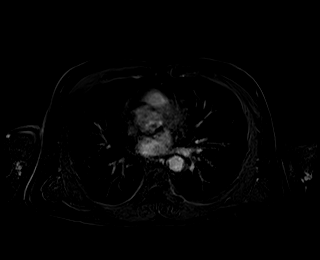

[48 of 48 positions shown; findings below may reference images not displayed]

FINDINGS: Lower chest: Normal heart size without pericardial or pleural
effusion.

Hepatobiliary: Normal liver. Normal gallbladder, without biliary
ductal dilatation.

Pancreas:  Normal, without mass or ductal dilatation.

Spleen:  Normal in size, without focal abnormality.

Adrenals/Urinary Tract: Normal adrenal glands. Within both kidneys,
there are small lesions. Some of these are consistent with simple
cysts. Others demonstrate areas of T2 hypointensity and precontrast
T1 hyperintensity, consistent with complexity. The CT abnormality
corresponds to an anterior interpolar 6 mm right renal lesion on
[DATE] which demonstrates T2 hypointensity and precontrast T1
hyperintensity. No post-contrast enhancement within any of these
lesions, including the lesion of interest.

No hydronephrosis.

Stomach/Bowel: Normal stomach. Suspect a periampullary duodenal
diverticulum. Otherwise normal small bowel. Normal colon.

Vascular/Lymphatic: Advanced aortic atherosclerosis. No
retroperitoneal or retrocrural adenopathy.

Other:  No ascites.

Musculoskeletal: No acute osseous abnormality.
IMPRESSION: 1. The 6 mm right renal lesion of interest represents a
hemorrhagic/proteinaceous cyst. Other tiny simple and complex cysts
are identified within the kidneys. No enhancing solid neoplasm.
2.  No acute abdominal process.
3.  Aortic Atherosclerosis ([TQ]-[TQ]).

## 2021-06-02 MED ORDER — GADOBUTROL 1 MMOL/ML IV SOLN
5.0000 mL | Freq: Once | INTRAVENOUS | Status: AC | PRN
  Administered 2021-06-02: 5 mL via INTRAVENOUS

## 2021-06-05 ENCOUNTER — Telehealth: Payer: Self-pay | Admitting: Urology

## 2021-06-05 NOTE — Telephone Encounter (Signed)
Abdominal MRI shows the small renal lesion to be a benign cyst.  No further follow-up or imaging is needed.  Please notify patient.  His daughter-in-law speaks [place] or can use a [place] interpreter ?

## 2021-06-17 DIAGNOSIS — Z20822 Contact with and (suspected) exposure to covid-19: Secondary | ICD-10-CM | POA: Diagnosis not present

## 2021-07-19 DIAGNOSIS — Z20822 Contact with and (suspected) exposure to covid-19: Secondary | ICD-10-CM | POA: Diagnosis not present

## 2021-07-31 DIAGNOSIS — Z20822 Contact with and (suspected) exposure to covid-19: Secondary | ICD-10-CM | POA: Diagnosis not present

## 2021-08-16 ENCOUNTER — Telehealth: Payer: Self-pay

## 2021-08-16 NOTE — Telephone Encounter (Signed)
I called and spoke with the patient daughter-n-law Jonathan Willis and informed her that he is due for his AWV visit. She informed me that her father-n-law is out of the country and will not return until September. She said she will call when he returns and schedule the appointment.

## 2021-11-04 ENCOUNTER — Ambulatory Visit: Payer: Medicaid Other | Admitting: Physician Assistant

## 2021-11-05 ENCOUNTER — Encounter: Payer: Self-pay | Admitting: Physician Assistant

## 2021-11-30 DIAGNOSIS — J9611 Chronic respiratory failure with hypoxia: Secondary | ICD-10-CM | POA: Diagnosis not present

## 2021-12-01 DIAGNOSIS — R0602 Shortness of breath: Secondary | ICD-10-CM | POA: Insufficient documentation

## 2021-12-01 DIAGNOSIS — R002 Palpitations: Secondary | ICD-10-CM | POA: Diagnosis not present

## 2021-12-01 DIAGNOSIS — R071 Chest pain on breathing: Secondary | ICD-10-CM | POA: Diagnosis not present

## 2021-12-01 DIAGNOSIS — E7800 Pure hypercholesterolemia, unspecified: Secondary | ICD-10-CM | POA: Diagnosis not present

## 2021-12-01 DIAGNOSIS — R0789 Other chest pain: Secondary | ICD-10-CM | POA: Diagnosis not present

## 2021-12-01 DIAGNOSIS — I10 Essential (primary) hypertension: Secondary | ICD-10-CM | POA: Diagnosis not present

## 2022-02-22 ENCOUNTER — Ambulatory Visit
Admission: RE | Admit: 2022-02-22 | Discharge: 2022-02-22 | Disposition: A | Discharge status: Home / Self Care | Payer: Medicare Other | Attending: Internal Medicine | Admitting: Internal Medicine

## 2022-02-22 ENCOUNTER — Encounter: Payer: Self-pay | Admitting: Internal Medicine

## 2022-02-22 ENCOUNTER — Ambulatory Visit
Admission: RE | Admit: 2022-02-22 | Discharge: 2022-02-22 | Disposition: A | Discharge status: Home / Self Care | Payer: Medicare Other | Source: Ambulatory Visit | Attending: Internal Medicine | Admitting: Internal Medicine

## 2022-02-22 ENCOUNTER — Ambulatory Visit (INDEPENDENT_AMBULATORY_CARE_PROVIDER_SITE_OTHER): Payer: Medicare Other | Admitting: Internal Medicine

## 2022-02-22 VITALS — BP 126/72 | HR 60 | Temp 96.8°F | Wt 110.0 lb

## 2022-02-22 DIAGNOSIS — R202 Paresthesia of skin: Secondary | ICD-10-CM

## 2022-02-22 DIAGNOSIS — M546 Pain in thoracic spine: Secondary | ICD-10-CM

## 2022-02-22 DIAGNOSIS — M5441 Lumbago with sciatica, right side: Secondary | ICD-10-CM | POA: Diagnosis not present

## 2022-02-22 DIAGNOSIS — M2578 Osteophyte, vertebrae: Secondary | ICD-10-CM | POA: Diagnosis not present

## 2022-02-22 DIAGNOSIS — M545 Low back pain, unspecified: Secondary | ICD-10-CM | POA: Diagnosis not present

## 2022-02-22 DIAGNOSIS — M542 Cervicalgia: Secondary | ICD-10-CM

## 2022-02-22 DIAGNOSIS — M47814 Spondylosis without myelopathy or radiculopathy, thoracic region: Secondary | ICD-10-CM | POA: Diagnosis not present

## 2022-02-22 DIAGNOSIS — H538 Other visual disturbances: Secondary | ICD-10-CM

## 2022-02-22 DIAGNOSIS — M47812 Spondylosis without myelopathy or radiculopathy, cervical region: Secondary | ICD-10-CM | POA: Diagnosis not present

## 2022-02-22 DIAGNOSIS — E119 Type 2 diabetes mellitus without complications: Secondary | ICD-10-CM

## 2022-02-22 DIAGNOSIS — R519 Headache, unspecified: Secondary | ICD-10-CM | POA: Diagnosis not present

## 2022-02-22 DIAGNOSIS — R42 Dizziness and giddiness: Secondary | ICD-10-CM

## 2022-02-22 DIAGNOSIS — M419 Scoliosis, unspecified: Secondary | ICD-10-CM | POA: Diagnosis not present

## 2022-02-22 DIAGNOSIS — M4802 Spinal stenosis, cervical region: Secondary | ICD-10-CM | POA: Diagnosis not present

## 2022-02-22 MED ORDER — MECLIZINE HCL 25 MG PO TABS: 25.0000 mg | ORAL_TABLET | Freq: Three times a day (TID) | ORAL | 0 refills | Status: DC | PRN

## 2022-02-22 NOTE — Progress Notes (Signed)
Subjective:    Patient ID: Jonathan Willis, male    DOB: [birth year], 76 y.o.   MRN: [id number]  HPI  Patient presents the clinic today with complaint of headache, dizziness, blurred vision, pain and numbness of his right upper extremity and right lower extremity.  His daughter-in-law reports this started 1 week ago.  The headache is located on the right side of his head.  He is unable to describe the pain.  He describes the dizziness as a sensation that the room is spinning.  He reports associated neck, mid back and low back pain.  He denies any loss of bowel or bladder control.  He denies any injury to the area or fall.  He has taken Tylenol and Ibuprofen OTC with minimal relief of symptoms.  He currently has a history of HTN, HLD with aortic atherosclerosis, DM 2 and COPD that are all currently untreated due to his lack of follow-up with PCP.    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 8 g 6   Blood Glucose Monitoring Suppl (ACCU-CHEK AVIVA CONNECT) w/Device KIT 1 Device by Does not apply route daily. Dx E11.4 1 kit 0   Fluticasone-Umeclidin-Vilant (TRELEGY ELLIPTA) 100-62.5-25 MCG/INH AEPB Inhale 1 puff into the lungs daily. 30 each 5   furosemide (LASIX) 20 MG tablet Take 20 mg by mouth daily.     glucose blood (ACCU-CHEK AVIVA PLUS) test strip USE 1 TEST STRIP TO TEST BLOOD SUGAR DAILY 100 strip 3   guaiFENesin (ROBITUSSIN) 100  MG/5ML liquid Take 10 mLs by mouth every 4 (four) hours as needed for cough or to loosen phlegm. 120 mL 0   meclizine (ANTIVERT) 12.5 MG tablet Take 1 tablet (12.5 mg total) by mouth 2 (two) times daily as needed for dizziness. 10 tablet 0   mirabegron ER (MYRBETRIQ) 25 MG TB24 tablet Take 1 tablet (25 mg total) by mouth daily. (Patient not taking: Reported on 05/13/2021) 30 tablet 11   Spacer/Aero-Hold Chamber Bags MISC 1 Device by Does not apply route every 6 (six) hours as needed (with inhaler). 1 each 1   telmisartan (MICARDIS) 40 MG tablet Take 1 tablet (40 mg total) by mouth daily. 90 tablet 1   No current facility-administered medications for this visit.    No Known Allergies  Family History  Problem Relation Age of Onset   COPD Neg Hx    Diabetes Mellitus II Neg Hx    Hypertension Neg Hx     Social History   Socioeconomic History   Marital status: Divorced    Spouse name: Not on file   Number of children: Not on file   Years of education: 9   Highest education level: 9th grade  Occupational History   Occupation: retired  Tobacco Use   Smoking status: Former    Packs/day: 1.50    Years: 44.00  Total pack years: 66.00    Types: Cigarettes    Quit date: 01/11/2007    Years since quitting: 15.1   Smokeless tobacco: Former  Scientific laboratory technician Use: Never used  Substance and Sexual Activity   Alcohol use: No   Drug use: No   Sexual activity: Yes    Birth control/protection: None  Other Topics Concern   Not on file  Social History Narrative   Not on file   Social Determinants of Health   Financial Resource Strain: Low Risk  (02/07/2017)   Overall Financial Resource Strain (CARDIA)    Difficulty of Paying Living Expenses: Not hard at all  Food Insecurity: No Food Insecurity (02/07/2017)   Hunger Vital Sign    Worried About Running Out of Food in the Last Year: Never true    Ran Out of Food in the Last Year: Never true  Transportation Needs: No Transportation  Needs (02/07/2017)   PRAPARE - Hydrologist (Medical): No    Lack of Transportation (Non-Medical): No  Physical Activity: Inactive (02/07/2017)   Exercise Vital Sign    Days of Exercise per Week: 0 days    Minutes of Exercise per Session: 0 min  Stress: No Stress Concern Present (02/07/2017)   [place]    Feeling of Stress : Not at all  Social Connections: Unknown (06/05/2018)   Social Connection and Isolation Panel [NHANES]    Frequency of Communication with Friends and Family: Not on file    Frequency of Social Gatherings with Friends and Family: Not on file    Attends Religious Services: Never    Active Member of Clubs or Organizations: Not on file    Attends Archivist Meetings: Not on file    Marital Status: Not on file  Intimate Partner Violence: Not At Risk (02/07/2017)   Humiliation, Afraid, Rape, and Kick questionnaire    Fear of Current or Ex-Partner: No    Emotionally Abused: No    Physically Abused: No    Sexually Abused: No     Constitutional: Patient reports headache.  Denies fever, malaise, fatigue, or abrupt weight changes.  HEENT: Patient reports blurred vision.  Denies eye pain, eye redness, ear pain, ringing in the ears, wax buildup, runny nose, nasal congestion, bloody nose, or sore throat. Respiratory: Patient reports chronic cough and shortness of breath.  Denies difficulty breathing, or sputum production.   Cardiovascular: Denies chest pain, chest tightness, palpitations or swelling in the hands or feet.  Gastrointestinal: Denies abdominal pain, bloating, constipation, diarrhea or blood in the stool.  GU: Denies urgency, frequency, pain with urination, burning sensation, blood in urine, odor or discharge. Musculoskeletal: Patient reports neck, mid back and low back pain.  Denies decrease in range of motion, difficulty with gait, muscle pain or joint  swelling.  Skin: Denies redness, rashes, lesions or ulcercations.  Neurological: Patient reports dizziness, numbness of his right upper and lower extremity..  Denies difficulty with memory, difficulty with speech or problems with balance and coordination.    No other specific complaints in a complete review of systems (except as listed in HPI above).  Objective:   Physical Exam   BP 126/72 (BP Location: Right Arm, Patient Position: Supine, Cuff Size: Normal)   Pulse 60   Temp (!) 96.8 F (36 C) (Temporal)   Wt 110 lb (49.9 kg)   SpO2 100%   BMI 20.78 kg/m  Wt Readings from Last 3 Encounters:  05/17/21 107 lb (48.5 kg)  05/13/21 105 lb (47.6 kg)  04/17/21 105 lb (47.6 kg)    General: Appears his stated age, in NAD. Skin: Warm, dry and intact. No rashes or ulcerations noted. HEENT: Head: normal shape and size; Eyes: sclera white, no icterus, conjunctiva pink, PERRLA and EOMs intact; Ears: Tm's gray and intact, normal light reflex;  Cardiovascular: Normal rate and rhythm. S1,S2 noted.  No murmur, rubs or gallops noted. No JVD or BLE edema. No carotid bruits noted. Pulmonary/Chest: Normal effort, with decreased inspiratory phase.  Scattered wheezing throughout.  No respiratory distress. No  rales or ronchi noted.  Abdomen: Soft and nontender. Normal bowel sounds.  Musculoskeletal: Normal flexion, extension, rotation of the spine.  Bony tenderness noted over the cervical, thoracic and lumbar spine.  Strength 5/5 BUE/BLE.  Shoulder shrug equal.  Handgrips equal.  He is able to stand on tiptoes and heels with assistance. Neurological: Alert and oriented.  Coordination abnormal but I think this is due to language barrier and his difficulty in following commands.  His tandem walk is very unstable.  Negative Romberg.    BMET    Component Value Date/Time   NA 137 05/13/2021 0642   NA 136 02/04/2014 0616   K 3.5 05/13/2021 0642   K 4.1 02/04/2014 0616   CL 104 05/13/2021 0642   CL  102 02/04/2014 0616   CO2 24 05/13/2021 0642   CO2 25 02/04/2014 0616   GLUCOSE 132 (H) 05/13/2021 0642   GLUCOSE 225 (H) 02/04/2014 0616   BUN 13 05/13/2021 0642   BUN 18 02/04/2014 0616   CREATININE 0.83 05/13/2021 0642   CREATININE 1.01 10/16/2018 0928   CALCIUM 9.1 05/13/2021 0642   CALCIUM 9.1 02/04/2014 0616   GFRNONAA >60 05/13/2021 0642   GFRNONAA 73 10/16/2018 0928   GFRAA >60 06/06/2019 1049   GFRAA 85 10/16/2018 0928    Lipid Panel     Component Value Date/Time   CHOL 179 10/16/2018 0928   TRIG 208 (H) 10/16/2018 0928   HDL 49 10/16/2018 0928   CHOLHDL 3.7 10/16/2018 0928   LDLCALC 98 10/16/2018 0928    CBC    Component Value Date/Time   WBC 6.4 05/13/2021 0642   RBC 4.44 05/13/2021 0642   HGB 13.8 05/13/2021 0642   HGB 14.1 02/06/2014 0345   HCT 42.4 05/13/2021 0642   HCT 42.3 02/06/2014 0345   PLT 290 05/13/2021 0642   PLT 336 02/06/2014 0345   MCV 95.5 05/13/2021 0642   MCV 95 02/06/2014 0345   MCH 31.1 05/13/2021 0642   MCHC 32.5 05/13/2021 0642   RDW 13.5 05/13/2021 0642   RDW 13.0 02/06/2014 0345   LYMPHSABS 2.5 05/13/2021 0642   LYMPHSABS 1.7 02/06/2014 0345   MONOABS 0.4 05/13/2021 0642   MONOABS 0.5 02/06/2014 0345   EOSABS 0.4 05/13/2021 0642   EOSABS 0.0 02/06/2014 0345   BASOSABS 0.0 05/13/2021 0642   BASOSABS 0.0 02/06/2014 0345    Hgb A1C Lab Results  Component Value Date   HGBA1C 8.5 (H) 02/20/2021           Assessment & Plan:   Headache, Dizziness, Blurred Vision, Neck Pain, Thoracic Back Pain, Low Back Pain, Pain and Paresthesia of Right Upper and Lower Extremity:  DDx include vertigo, migraine with hemiplegia, cervical radiculitis, thoracic radiculitis, lumbar radiculitis, stroke We will obtain x-ray cervical spine Will obtain x-ray thoracic spine Will obtain x-ray of lumbar spine We  will check CBC, c-Met, lipid, A1c and urine microalbumin today Rx for meclizine 25 mg 3 times daily as needed ER precautions  discussed. Discussed possibility of stroke with daughter-in-law.  Advised her that I can do some work-up here but stroke rule out would consist of an MRI brain which should be done at the ER.  She reports her father in law is adamant that he does not want to go to the hospital at this time  RTC in 3 months for follow-up of chronic conditions Webb Silversmith, NP

## 2022-02-23 LAB — CBC
HCT: 45.7 % (ref 38.5–50.0)
Hemoglobin: 15.9 g/dL (ref 13.2–17.1)
MCH: 32.1 pg (ref 27.0–33.0)
MCHC: 34.8 g/dL (ref 32.0–36.0)
MCV: 92.1 fL (ref 80.0–100.0)
MPV: 9.4 fL (ref 7.5–12.5)
Platelets: 263 10*3/uL (ref 140–400)
RBC: 4.96 10*6/uL (ref 4.20–5.80)
RDW: 12.9 % (ref 11.0–15.0)
WBC: 7.1 10*3/uL (ref 3.8–10.8)

## 2022-02-23 LAB — MICROALBUMIN / CREATININE URINE RATIO
Creatinine, Urine: 58 mg/dL (ref 20–320)
Microalb Creat Ratio: 9 mcg/mg creat (ref <30)
Microalb, Ur: 0.5 mg/dL

## 2022-02-23 LAB — COMPLETE METABOLIC PANEL WITH GFR
AG Ratio: 1.4 (calc) (ref 1.0–2.5)
ALT: 16 U/L (ref 9–46)
AST: 16 U/L (ref 10–35)
Albumin: 4.6 g/dL (ref 3.6–5.1)
Alkaline phosphatase (APISO): 63 U/L (ref 35–144)
BUN: 19 mg/dL (ref 7–25)
CO2: 30 mmol/L (ref 20–32)
Calcium: 10.1 mg/dL (ref 8.6–10.3)
Chloride: 102 mmol/L (ref 98–110)
Creat: 0.95 mg/dL (ref 0.70–1.28)
Globulin: 3.4 g/dL (calc) (ref 1.9–3.7)
Glucose, Bld: 79 mg/dL (ref 65–99)
Potassium: 4.6 mmol/L (ref 3.5–5.3)
Sodium: 141 mmol/L (ref 135–146)
Total Bilirubin: 0.5 mg/dL (ref 0.2–1.2)
Total Protein: 8 g/dL (ref 6.1–8.1)
eGFR: 82 mL/min/{1.73_m2} (ref >=60)

## 2022-02-23 LAB — LIPID PANEL
Cholesterol: 276 mg/dL — ABNORMAL HIGH (ref <200)
HDL: 48 mg/dL (ref >=40)
LDL Cholesterol (Calc): 170 mg/dL (calc) — ABNORMAL HIGH
Non-HDL Cholesterol (Calc): 228 mg/dL (calc) — ABNORMAL HIGH (ref <130)
Total CHOL/HDL Ratio: 5.8 (calc) — ABNORMAL HIGH (ref <5.0)
Triglycerides: 340 mg/dL — ABNORMAL HIGH (ref <150)

## 2022-02-23 LAB — HEMOGLOBIN A1C
Hgb A1c MFr Bld: 6.9 % of total Hgb — ABNORMAL HIGH (ref <5.7)
Mean Plasma Glucose: 151 mg/dL
eAG (mmol/L): 8.4 mmol/L

## 2022-02-24 ENCOUNTER — Other Ambulatory Visit: Payer: Self-pay

## 2022-02-24 DIAGNOSIS — M5441 Lumbago with sciatica, right side: Secondary | ICD-10-CM

## 2022-02-24 DIAGNOSIS — E1169 Type 2 diabetes mellitus with other specified complication: Secondary | ICD-10-CM

## 2022-02-24 DIAGNOSIS — M542 Cervicalgia: Secondary | ICD-10-CM

## 2022-02-24 DIAGNOSIS — R202 Paresthesia of skin: Secondary | ICD-10-CM

## 2022-02-24 DIAGNOSIS — M546 Pain in thoracic spine: Secondary | ICD-10-CM

## 2022-02-24 MED ORDER — METFORMIN HCL 500 MG PO TABS: 500.0000 mg | ORAL_TABLET | Freq: Two times a day (BID) | ORAL | 0 refills | Status: DC

## 2022-02-24 MED ORDER — ROSUVASTATIN CALCIUM 10 MG PO TABS: 10.0000 mg | ORAL_TABLET | Freq: Every day | ORAL | 0 refills | Status: DC

## 2022-02-24 NOTE — Addendum Note (Signed)
Addended by: Jearld Fenton on: 02/24/2022 10:37 AM   Modules accepted: Orders

## 2022-02-24 NOTE — Progress Notes (Signed)
Metformin sent to pharmacy and referral to orthopedics placed.

## 2022-03-04 ENCOUNTER — Telehealth: Payer: Self-pay | Admitting: Internal Medicine

## 2022-03-04 ENCOUNTER — Ambulatory Visit (INDEPENDENT_AMBULATORY_CARE_PROVIDER_SITE_OTHER): Payer: Medicare Other

## 2022-03-04 VITALS — BP 138/62 | Ht 62.0 in | Wt 110.8 lb

## 2022-03-04 DIAGNOSIS — Z0000 Encounter for general adult medical examination without abnormal findings: Secondary | ICD-10-CM

## 2022-03-04 NOTE — Patient Instructions (Signed)
Jonathan Willis , Thank you for taking time to come for your Medicare Wellness Visit. I appreciate your ongoing commitment to your health goals. Please review the following plan we discussed and let me know if I can assist you in the future.   Screening recommendations/referrals: Colonoscopy: 04/10/17, declined referral Recommended yearly ophthalmology/optometry visit for glaucoma screening and checkup Recommended yearly dental visit for hygiene and checkup  Vaccinations: Influenza vaccine: n/d, sick today Pneumococcal vaccine: 01/10/17 Tdap vaccine: 10/16/18 Shingles vaccine: n/d   Covid-19: 05/23/19, 06/13/19, 04/06/20  Advanced directives: no  Conditions/risks identified: none  Next appointment: Follow up in one year for your annual wellness visit. 03/10/23 @ 8:15 am in person  Preventive Care 65 Years and Older, Male Preventive care refers to lifestyle choices and visits with your health care provider that can promote health and wellness. What does preventive care include? A yearly physical exam. This is also called an annual well check. Dental exams once or twice a year. Routine eye exams. Ask your health care provider how often you should have your eyes checked. Personal lifestyle choices, including: Daily care of your teeth and gums. Regular physical activity. Eating a healthy diet. Avoiding tobacco and drug use. Limiting alcohol use. Practicing safe sex. Taking low doses of aspirin every day. Taking vitamin and mineral supplements as recommended by your health care provider. What happens during an annual well check? The services and screenings done by your health care provider during your annual well check will depend on your age, overall health, lifestyle risk factors, and family history of disease. Counseling  Your health care provider may ask you questions about your: Alcohol use. Tobacco use. Drug use. Emotional well-being. Home and relationship well-being. Sexual  activity. Eating habits. History of falls. Memory and ability to understand (cognition). Work and work Statistician. Screening  You may have the following tests or measurements: Height, weight, and BMI. Blood pressure. Lipid and cholesterol levels. These may be checked every 5 years, or more frequently if you are over 39 years old. Skin check. Lung cancer screening. You may have this screening every year starting at age 29 if you have a 30-pack-year history of smoking and currently smoke or have quit within the past 15 years. Fecal occult blood test (FOBT) of the stool. You may have this test every year starting at age 58. Flexible sigmoidoscopy or colonoscopy. You may have a sigmoidoscopy every 5 years or a colonoscopy every 10 years starting at age 76. Prostate cancer screening. Recommendations will vary depending on your family history and other risks. Hepatitis C blood test. Hepatitis B blood test. Sexually transmitted disease (STD) testing. Diabetes screening. This is done by checking your blood sugar (glucose) after you have not eaten for a while (fasting). You may have this done every 1-3 years. Abdominal aortic aneurysm (AAA) screening. You may need this if you are a current or former smoker. Osteoporosis. You may be screened starting at age 59 if you are at high risk. Talk with your health care provider about your test results, treatment options, and if necessary, the need for more tests. Vaccines  Your health care provider may recommend certain vaccines, such as: Influenza vaccine. This is recommended every year. Tetanus, diphtheria, and acellular pertussis (Tdap, Td) vaccine. You may need a Td booster every 10 years. Zoster vaccine. You may need this after age 65. Pneumococcal 13-valent conjugate (PCV13) vaccine. One dose is recommended after age 95. Pneumococcal polysaccharide (PPSV23) vaccine. One dose is recommended after age 22. Talk  to your health care provider about which  screenings and vaccines you need and how often you need them. This information is not intended to replace advice given to you by your health care provider. Make sure you discuss any questions you have with your health care provider. Document Released: 04/10/2015 Document Revised: 12/02/2015 Document Reviewed: 01/13/2015 Elsevier Interactive Patient Education  2017 [place] Prevention in the Home Falls can cause injuries. They can happen to people of all ages. There are many things you can do to make your home safe and to help prevent falls. What can I do on the outside of my home? Regularly fix the edges of walkways and driveways and fix any cracks. Remove anything that might make you trip as you walk through a door, such as a raised step or threshold. Trim any bushes or trees on the path to your home. Use bright outdoor lighting. Clear any walking paths of anything that might make someone trip, such as rocks or tools. Regularly check to see if handrails are loose or broken. Make sure that both sides of any steps have handrails. Any raised decks and porches should have guardrails on the edges. Have any leaves, snow, or ice cleared regularly. Use sand or salt on walking paths during winter. Clean up any spills in your garage right away. This includes oil or grease spills. What can I do in the bathroom? Use night lights. Install grab bars by the toilet and in the tub and shower. Do not use towel bars as grab bars. Use non-skid mats or decals in the tub or shower. If you need to sit down in the shower, use a plastic, non-slip stool. Keep the floor dry. Clean up any water that spills on the floor as soon as it happens. Remove soap buildup in the tub or shower regularly. Attach bath mats securely with double-sided non-slip rug tape. Do not have throw rugs and other things on the floor that can make you trip. What can I do in the bedroom? Use night lights. Make sure that you have a  light by your bed that is easy to reach. Do not use any sheets or blankets that are too big for your bed. They should not hang down onto the floor. Have a firm chair that has side arms. You can use this for support while you get dressed. Do not have throw rugs and other things on the floor that can make you trip. What can I do in the kitchen? Clean up any spills right away. Avoid walking on wet floors. Keep items that you use a lot in easy-to-reach places. If you need to reach something above you, use a strong step stool that has a grab bar. Keep electrical cords out of the way. Do not use floor polish or wax that makes floors slippery. If you must use wax, use non-skid floor wax. Do not have throw rugs and other things on the floor that can make you trip. What can I do with my stairs? Do not leave any items on the stairs. Make sure that there are handrails on both sides of the stairs and use them. Fix handrails that are broken or loose. Make sure that handrails are as long as the stairways. Check any carpeting to make sure that it is firmly attached to the stairs. Fix any carpet that is loose or worn. Avoid having throw rugs at the top or bottom of the stairs. If you do have throw rugs,  attach them to the floor with carpet tape. Make sure that you have a light switch at the top of the stairs and the bottom of the stairs. If you do not have them, ask someone to add them for you. What else can I do to help prevent falls? Wear shoes that: Do not have high heels. Have rubber bottoms. Are comfortable and fit you well. Are closed at the toe. Do not wear sandals. If you use a stepladder: Make sure that it is fully opened. Do not climb a closed stepladder. Make sure that both sides of the stepladder are locked into place. Ask someone to hold it for you, if possible. Clearly mark and make sure that you can see: Any grab bars or handrails. First and last steps. Where the edge of each step  is. Use tools that help you move around (mobility aids) if they are needed. These include: Canes. Walkers. Scooters. Crutches. Turn on the lights when you go into a dark area. Replace any light bulbs as soon as they burn out. Set up your furniture so you have a clear path. Avoid moving your furniture around. If any of your floors are uneven, fix them. If there are any pets around you, be aware of where they are. Review your medicines with your doctor. Some medicines can make you feel dizzy. This can increase your chance of falling. Ask your doctor what other things that you can do to help prevent falls. This information is not intended to replace advice given to you by your health care provider. Make sure you discuss any questions you have with your health care provider. Document Released: 01/08/2009 Document Revised: 08/20/2015 Document Reviewed: 04/18/2014 Elsevier Interactive Patient Education  2017 [employer].

## 2022-03-04 NOTE — Telephone Encounter (Signed)
Pt was here and per [place] [employer] it was okay to continue to check the pt in.

## 2022-03-04 NOTE — Progress Notes (Signed)
Subjective:   Jonathan Willis is a 76 y.o. male who presents for Medicare Annual/Subsequent preventive examination.  Review of Systems     Cardiac Risk Factors include: advanced age (>45mn, >>76women);family history of premature cardiovascular disease;male gender;diabetes mellitus;dyslipidemia;hypertension     Objective:    Today's Vitals   03/04/22 0829 03/04/22 0831  BP: 138/62   Weight: 110 lb 12.8 oz (50.3 kg)   Height: _0  (1.575 m)   PainSc:  4    Body mass index is 20.27 kg/m.     05/13/2021    6:40 AM 04/17/2021    9:12 PM 02/21/2021    2:28 PM 09/10/2020   12:38 PM 09/02/2020    5:00 PM 09/02/2020    7:24 AM 07/20/2020    9:05 AM  Advanced Directives  Does Patient Have a Medical Advance Directive? _1  No No  Would patient like information on creating a medical advance directive? No - Patient declined  No - Patient declined No - Patient declined No - Patient declined      Current Medications (verified) Outpatient Encounter Medications as of 03/04/2022  Medication Sig   Accu-Chek Softclix Lancets lancets USE TO TEST ONCE DAILY   albuterol (VENTOLIN HFA) 108 (90 Base) MCG/ACT inhaler Inhale 2 puffs into the lungs every 6 (six) hours as needed for wheezing or shortness of breath.   Blood Glucose Monitoring Suppl (ACCU-CHEK AVIVA CONNECT) w/Device KIT 1 Device by Does not apply route daily. Dx E11.4   Fluticasone-Umeclidin-Vilant (TRELEGY ELLIPTA) 100-62.5-25 MCG/INH AEPB Inhale 1 puff into the lungs daily.   meclizine (ANTIVERT) 25 MG tablet Take 1 tablet (25 mg total) by mouth 3 (three) times daily as needed for dizziness.   metFORMIN (GLUCOPHAGE) 500 MG tablet Take 1 tablet (500 mg total) by mouth 2 (two) times daily with a meal.   rosuvastatin (CRESTOR) 10 MG tablet Take 1 tablet (10 mg total) by mouth daily.   No facility-administered encounter medications on file as of 03/04/2022.    Allergies (verified) Patient has no known allergies.   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   History of TB (tuberculosis)    Hypertension    Lower GI bleed    Past Surgical History:  Procedure Laterality Date   COLONOSCOPY WITH PROPOFOL N/A 04/10/2017   Procedure: COLONOSCOPY WITH PROPOFOL;  Surgeon: AJonathon Bellows MD;  [facility];  Service: Gastroenterology;  Laterality: N/A;   NO PAST SURGERIES     Family History  Problem Relation Age of Onset   COPD Neg Hx    Diabetes Mellitus II Neg Hx    Hypertension Neg Hx    Social History   Socioeconomic History   Marital status: Divorced    Spouse name: Not on file   Number of children: Not on file   Years of education: 9   Highest education level: 9th grade  Occupational History   Occupation: retired  Tobacco Use   Smoking status: Former    Packs/day: 1.50    Years: 44.00    Total pack years: 66.00    Types: Cigarettes    Quit date: 01/11/2007    Years since quitting: 15.1   Smokeless tobacco: Former  VScientific laboratory technicianUse: Never used  Substance and  Sexual Activity   Alcohol use: No   Drug use: No   Sexual activity: Yes    Birth control/protection: None  Other Topics Concern   Not on file  Social History Narrative   Not on file   Social Determinants of Health   Financial Resource Strain: High Risk (03/04/2022)   Overall Financial Resource Strain (CARDIA)    Difficulty of Paying Living Expenses: Very hard  Food Insecurity: No Food Insecurity (03/04/2022)   Hunger Vital Sign    Worried About Running Out of Food in the Last Year: Never true    Ran Out of Food in the Last Year: Never true  Transportation Needs: No Transportation Needs (03/04/2022)   PRAPARE - Hydrologist (Medical): No    Lack of Transportation (Non-Medical): No  Physical  Activity: Insufficiently Active (03/04/2022)   Exercise Vital Sign    Days of Exercise per Week: 4 days    Minutes of Exercise per Session: 30 min  Stress: No Stress Concern Present (03/04/2022)   [place]    Feeling of Stress : Not at all  Social Connections: Moderately Isolated (03/04/2022)   Social Connection and Isolation Panel [NHANES]    Frequency of Communication with Friends and Family: More than three times a week    Frequency of Social Gatherings with Friends and Family: More than three times a week    Attends Religious Services: 1 to 4 times per year    Active Member of [employer] or Organizations: No    Attends Music therapist: Never    Marital Status: Divorced    Tobacco Counseling Counseling given: Not Answered   Clinical Intake:  Pre-visit preparation completed: Yes  Pain : 0-10 Pain Score: 4  Pain Radiating Towards: aches all over     Diabetes: Yes CBG done?: No Did pt. bring in CBG monitor from home?: No  How often do you need to have someone help you when you read instructions, pamphlets, or other written materials from your doctor or pharmacy?: 3 - Sometimes  Diabetic?yes Nutrition Risk Assessment:  Has the patient had any N/V/D within the last 2 months?  No  Does the patient have any non-healing wounds?  No  Has the patient had any unintentional weight loss or weight gain?  No   Diabetes:  Is the patient diabetic?  Yes  If diabetic, was a CBG obtained today?  No  Did the patient bring in their glucometer from home?  No  How often do you monitor your CBG's? occasionally.   Financial Strains and Diabetes Management:  Are you having any financial strains with the device, your supplies or your medication? No .  Does the patient want to be seen by Chronic Care Management for management of their diabetes?  No  Would the patient like to be referred to a Nutritionist or for  Diabetic Management?  No   Diabetic Exams:  Diabetic Eye Exam: Completed 11/30/17. Overdue for diabetic eye exam. Pt has been advised about the importance in completing this exam.   Diabetic Foot Exam: Completed 09/22/20. Pt has been advised about the importance in completing this exam.   Interpreter Needed?: No  Comments: daughter is interpreting for him today in the room Information entered by :: Kirke Shaggy, LPN   Activities of Daily Living    03/04/2022    8:40 AM  In your present state of health, do you have any  difficulty performing the following activities:  Hearing? 0  Vision? 0  Difficulty concentrating or making decisions? 0  Walking or climbing stairs? 0  Dressing or bathing? 0  Doing errands, shopping? 0  Preparing Food and eating ? N  Using the Toilet? N  In the past six months, have you accidently leaked urine? N  Do you have problems with loss of bowel control? N  Managing your Medications? N  Managing your Finances? N  Housekeeping or managing your Housekeeping? N    Patient Care Team: Jearld Fenton, NP as PCP - General (Internal Medicine) Minor, Dalbert Garnet, RN (Inactive) as [place] any recent [place] you may have received from other than [facility] providers in the past year (date may be approximate).     Assessment:   This is a routine wellness examination for [place].  Hearing/Vision screen Hearing Screening - Comments:: No aids  Vision Screening - Comments:: Readers- Dr. Ellin Mayhew  Dietary issues and exercise activities discussed: Current Exercise Habits: Home exercise routine, Type of exercise: walking, Time (Minutes): 30, Frequency (Times/Week): 4, Weekly Exercise (Minutes/Week): 120, Intensity: Mild   Goals Addressed             This Visit's Progress    DIET - EAT MORE FRUITS AND VEGETABLES         Depression Screen    03/04/2022    8:36 AM 02/22/2022    2:00 PM 04/16/2020    8:35 AM  04/11/2019    8:35 AM 06/05/2018    8:27 AM 02/07/2017   11:12 AM 01/10/2017   10:35 AM  PHQ 2/9 Scores  PHQ - 2 Score 0 0 0 0 0 0 0  PHQ- 9 Score 0          Fall Risk    03/04/2022    8:40 AM 04/16/2020    8:35 AM 04/11/2019    8:35 AM 10/16/2018    9:07 AM 06/05/2018    8:27 AM  Fall Risk   Falls in the past year? 0 0 0 0 0  Number falls in past yr: 0 0 0 0   Injury with Fall? 0  0    Risk for fall due to : No Fall Risks No Fall Risks     Follow up Falls prevention discussed;Falls evaluation completed Falls evaluation completed Falls evaluation completed      FALL RISK PREVENTION PERTAINING TO THE HOME:  Any stairs in or around the home? Yes  If so, are there any without handrails? No  Home free of loose throw rugs in walkways, pet beds, electrical cords, etc? Yes  Adequate lighting in your home to reduce risk of falls? Yes   ASSISTIVE DEVICES UTILIZED TO PREVENT FALLS:  Life alert? No  Use of a cane, walker or w/c? No  Grab bars in the bathroom? No  Shower chair or bench in shower? No  Elevated toilet seat or a handicapped toilet? No   TIMED UP AND GO:  Was the test performed? Yes .  Length of time to ambulate 10 feet: 4 sec.   Gait steady and fast without use of assistive device  Cognitive Function:        03/04/2022    8:47 AM  6CIT Screen  What Year? 0 points  What month? 0 points  What time? 0 points  Count back from 20 0 points  Months in reverse 0 points    Immunizations Immunization History  Administered Date(s) Administered   Hep B, Unspecified 09/18/1996, 03/17/1997   Hepatitis B 09/18/1996, 03/17/1997   Influenza, High Dose Seasonal PF 01/10/2017, 12/05/2017   PFIZER Comirnaty(Gray Top)Covid-19 Tri-Sucrose Vaccine 05/23/2019, 06/13/2019, 04/06/2020   PFIZER(Purple Top)SARS-COV-2 Vaccination 05/23/2019, 06/13/2019, 04/06/2020   Pneumococcal Conjugate-13 01/10/2017   Pneumococcal Polysaccharide-23 04/03/2018   Td 08/07/1995, 10/07/1995,  03/29/1996   Tdap 10/16/2018    TDAP status: Up to date  Flu Vaccine status: Due, Education has been provided regarding the importance of this vaccine. Advised may receive this vaccine at local pharmacy or Health Dept. Aware to provide a copy of the vaccination record if obtained from local pharmacy or Health Dept. Verbalized acceptance and understanding.  Pneumococcal vaccine status: Up to date  Covid-19 vaccine status: Completed vaccines  Qualifies for Shingles Vaccine? Yes   Zostavax completed No   Shingrix Completed?: Yes  Screening Tests Health Maintenance  Topic Date Due   Zoster Vaccines- Shingrix (1 of 2) Never done   OPHTHALMOLOGY EXAM  12/01/2018   COLONOSCOPY (Pts 45-38yr Insurance coverage will need to be confirmed)  04/10/2020   FOOT EXAM  09/22/2021   INFLUENZA VACCINE  10/26/2021   COVID-19 Vaccine (7 - 2023-24 season) 11/26/2021   Lung Cancer Screening  04/19/2022   HEMOGLOBIN A1C  08/23/2022   Diabetic kidney evaluation - GFR measurement  02/23/2023   Diabetic kidney evaluation - Urine ACR  02/23/2023   Medicare Annual Wellness (AWV)  03/05/2023   DTaP/Tdap/Td (5 - Td or Tdap) 10/15/2028   Pneumonia Vaccine 76 Years old  Completed   Hepatitis C Screening  Completed   HPV VACCINES  Aged Out    Health Maintenance  Health Maintenance Due  Topic Date Due   Zoster Vaccines- Shingrix (1 of 2) Never done   OPHTHALMOLOGY EXAM  12/01/2018   COLONOSCOPY (Pts 45-467yrInsurance coverage will need to be confirmed)  04/10/2020   FOOT EXAM  09/22/2021   INFLUENZA VACCINE  10/26/2021   COVID-19 Vaccine (7 - 2023-24 season) 11/26/2021    Colorectal cancer screening: Type of screening: Colonoscopy. Completed 04/10/17. Repeat every 3 years- declined referral for now  Lung Cancer Screening: (Low Dose CT Chest recommended if Age 682-80ears, 30 pack-year currently smoking OR have quit w/in 15years.) does not qualify.    Additional Screening:  Hepatitis C  Screening: does qualify; Completed 02/15/17  Vision Screening: Recommended annual ophthalmology exams for early detection of glaucoma and other disorders of the eye. Is the patient up to date with their annual eye exam?  Yes  Who is the provider or what is the name of the office in which the patient attends annual eye exams? Dr.Woodard If pt is not established with a provider, would they like to be referred to a provider to establish care? No .   Dental Screening: Recommended annual dental exams for proper oral hygiene  Community Resource Referral / Chronic Care Management: CRR required this visit?  No   CCM required this visit?  No      Plan:     I have personally reviewed and noted the following in the patient's chart:   Medical and social history Use of alcohol, tobacco or illicit drugs  Current medications and supplements including opioid prescriptions. Patient is not currently taking opioid prescriptions. Functional ability and status Nutritional status Physical activity Advanced directives List of other physicians Hospitalizations, surgeries, and ER visits in previous 12 months Vitals Screenings to include cognitive, depression, and falls Referrals and appointments  In addition,  I have reviewed and discussed with patient certain preventive protocols, quality metrics, and best practice recommendations. A written personalized care plan for preventive services as well as general preventive health recommendations were provided to patient.     Dionisio David, LPN   [id number]   Nurse Notes: none

## 2022-03-30 ENCOUNTER — Emergency Department
Admission: EM | Admit: 2022-03-30 | Discharge: 2022-03-30 | Disposition: A | Discharge status: Home / Self Care | Payer: Medicare Other | Attending: Emergency Medicine | Admitting: Emergency Medicine

## 2022-03-30 ENCOUNTER — Emergency Department: Payer: Medicare Other

## 2022-03-30 ENCOUNTER — Other Ambulatory Visit: Payer: Self-pay

## 2022-03-30 ENCOUNTER — Encounter: Payer: Self-pay | Admitting: Emergency Medicine

## 2022-03-30 DIAGNOSIS — R0602 Shortness of breath: Secondary | ICD-10-CM | POA: Diagnosis not present

## 2022-03-30 DIAGNOSIS — J441 Chronic obstructive pulmonary disease with (acute) exacerbation: Secondary | ICD-10-CM | POA: Diagnosis not present

## 2022-03-30 DIAGNOSIS — Z1152 Encounter for screening for COVID-19: Secondary | ICD-10-CM | POA: Insufficient documentation

## 2022-03-30 DIAGNOSIS — J431 Panlobular emphysema: Secondary | ICD-10-CM

## 2022-03-30 LAB — CBC WITH DIFFERENTIAL/PLATELET
Abs Immature Granulocytes: 0.03 10*3/uL (ref 0.00–0.07)
Basophils Absolute: 0.1 10*3/uL (ref 0.0–0.1)
Basophils Relative: 1 %
Eosinophils Absolute: 0.2 10*3/uL (ref 0.0–0.5)
Eosinophils Relative: 2 %
HCT: 47 % (ref 39.0–52.0)
Hemoglobin: 15.6 g/dL (ref 13.0–17.0)
Immature Granulocytes: 0 %
Lymphocytes Relative: 27 %
Lymphs Abs: 2.8 10*3/uL (ref 0.7–4.0)
MCH: 31.7 pg (ref 26.0–34.0)
MCHC: 33.2 g/dL (ref 30.0–36.0)
MCV: 95.5 fL (ref 80.0–100.0)
Monocytes Absolute: 0.8 10*3/uL (ref 0.1–1.0)
Monocytes Relative: 8 %
Neutro Abs: 6.3 10*3/uL (ref 1.7–7.7)
Neutrophils Relative %: 62 %
Platelets: 235 10*3/uL (ref 150–400)
RBC: 4.92 MIL/uL (ref 4.22–5.81)
RDW: 12.4 % (ref 11.5–15.5)
WBC: 10.1 10*3/uL (ref 4.0–10.5)
nRBC: 0 % (ref 0.0–0.2)

## 2022-03-30 LAB — COMPREHENSIVE METABOLIC PANEL
ALT: 24 U/L (ref 0–44)
AST: 21 U/L (ref 15–41)
Albumin: 4.2 g/dL (ref 3.5–5.0)
Alkaline Phosphatase: 75 U/L (ref 38–126)
Anion gap: 9 (ref 5–15)
BUN: 17 mg/dL (ref 8–23)
CO2: 27 mmol/L (ref 22–32)
Calcium: 9.2 mg/dL (ref 8.9–10.3)
Chloride: 102 mmol/L (ref 98–111)
Creatinine, Ser: 0.98 mg/dL (ref 0.61–1.24)
GFR, Estimated: >60 mL/min (ref >60)
Glucose, Bld: 134 mg/dL — ABNORMAL HIGH (ref 70–99)
Potassium: 4.1 mmol/L (ref 3.5–5.1)
Sodium: 138 mmol/L (ref 135–145)
Total Bilirubin: 0.8 mg/dL (ref 0.3–1.2)
Total Protein: 8.2 g/dL — ABNORMAL HIGH (ref 6.5–8.1)

## 2022-03-30 LAB — RESP PANEL BY RT-PCR (RSV, FLU A&B, COVID)  RVPGX2
Influenza A by PCR: NEGATIVE
Influenza B by PCR: NEGATIVE
Resp Syncytial Virus by PCR: NEGATIVE
SARS Coronavirus 2 by RT PCR: NEGATIVE

## 2022-03-30 LAB — TROPONIN I (HIGH SENSITIVITY): Troponin I (High Sensitivity): 4 ng/L (ref <18)

## 2022-03-30 MED ORDER — AZITHROMYCIN 250 MG PO TABS: ORAL_TABLET | ORAL | 0 refills | Status: AC

## 2022-03-30 MED ORDER — PREDNISONE 10 MG (21) PO TBPK: ORAL_TABLET | ORAL | 0 refills | Status: DC

## 2022-03-30 NOTE — ED Notes (Signed)
Pt unable to sign for d/c education and paperwork as computer in pt's room won't turn on.

## 2022-03-30 NOTE — ED Notes (Signed)
Pt denies nausea, sweatiness and SOB. Pt's resp reg/unlabored, skin dry and sitting calmly on stretcher currently. Visitor at bedside. Pt  denies numbness/tingling or pain in jaw, shoulder blades or arms.

## 2022-03-30 NOTE — ED Triage Notes (Signed)
Pt to triage via w/c with no distress noted; pt accomp by family member who reports pt with hx COPD; last 2 days having SHOB accomp by nonprod cough and mid epigastric discomfort

## 2022-03-30 NOTE — ED Provider Notes (Signed)
[facility] Provider Note   Event Date/Time   First MD Initiated Contact with Patient 03/30/22 1036     (approximate) History  Shortness of Breath  HPI Jonathan Willis is a 77 y.o. male with a stated past medical history of COPD who presents for worsening shortness of breath over the last few days.  Patient states that this feels similar to previous COPD exacerbations he has had in the past.  Patient states that he has a daily controller inhaler that he uses every day denies any nonadherence.  Patient denies any recent travel or sick contacts.  Patient has been using his home inhalers with minimal relief.  Patient does not have a primary care physician that he is seen regarding his symptoms. ROS: Patient currently denies any vision changes, tinnitus, difficulty speaking, facial droop, sore throat, chest pain, abdominal pain, nausea/vomiting/diarrhea, dysuria, or weakness/numbness/paresthesias in any extremity   Physical Exam  Triage Vital Signs: ED Triage Vitals [03/30/22 0645]  Enc Vitals Group     BP (!) 142/79     Pulse Rate 98     Resp (!) 22     Temp 97.9 F (36.6 C)     Temp Source Oral     SpO2 97 %     Weight 107 lb (48.5 kg)     Height '5\' 2"'$  (1.575 m)     Head Circumference      Peak Flow      Pain Score 10     Pain Loc      Pain Edu?      Excl. in [place]?    Most recent vital signs: Vitals:   03/30/22 0645 03/30/22 1114  BP: (!) 142/79 (!) 146/86  Pulse: 98 93  Resp: (!) 22 18  Temp: 97.9 F (36.6 C)   SpO2: 97% 97%   General: Awake, oriented x4. CV:  Good peripheral perfusion.  Resp:  Normal effort.  Inspiratory and expiratory wheezes over bilateral lung fields Abd:  No distention.  Other:  Middle-aged [place] male laying in bed in no acute distress ED Results / Procedures / Treatments  Labs (all labs ordered are listed, but only abnormal results are displayed) Labs Reviewed  COMPREHENSIVE METABOLIC PANEL - Abnormal; Notable for the  following components:      Result Value   Glucose, Bld 134 (*)    Total Protein 8.2 (*)    All other components within normal limits  RESP PANEL BY RT-PCR (RSV, FLU A&B, COVID)  [id number]  CBC WITH DIFFERENTIAL/PLATELET  TROPONIN I (HIGH SENSITIVITY)   EKG ED ECG REPORT I, Naaman Plummer, the attending physician, personally viewed and interpreted this ECG. Date: 03/30/2022 EKG Time: 0713 Rate: 96 Rhythm: normal sinus rhythm QRS Axis: normal Intervals: normal ST/T Wave abnormalities: normal Narrative Interpretation: no evidence of acute ischemia RADIOLOGY ED MD interpretation: 2 view chest x-ray interpreted by me shows no evidence of acute abnormalities including no pneumonia, pneumothorax, or widened mediastinum -Agree with radiology assessment Official radiology report(s): No results found. PROCEDURES: Critical Care performed: No Procedures MEDICATIONS ORDERED IN ED: Medications - No data to display IMPRESSION / MDM / [place] / ED COURSE  I reviewed the triage vital signs and the nursing notes.                              Patient's presentation is most consistent with acute presentation with potential threat to  life or bodily function. The patient appears to be suffering from a moderate exacerbation of COPD.  Based on the history, exam, CXR/EKG, and further workup I dont suspect any other emergent cause of this presentation, such as pneumonia, acute coronary syndrome, congestive heart failure, pulmonary embolism, or pneumothorax.  Rx: Steroids, Antibiotics, Albuterol Disposition: Discharge home with SRP. PCP follow up recommended in next 48hours.   FINAL CLINICAL IMPRESSION(S) / ED DIAGNOSES   Final diagnoses:  COPD exacerbation ([place])   Rx / DC Orders   ED Discharge Orders          Ordered    predniSONE (STERAPRED UNI-PAK 21 TAB) 10 MG (21) TBPK tablet        03/30/22 1103    azithromycin (ZITHROMAX Z-PAK) 250 MG tablet        03/30/22 1103            Note:  This document was prepared using Dragon voice recognition software and may include unintentional dictation errors.   Naaman Plummer, MD 03/31/22 [phone]

## 2022-03-30 NOTE — ED Notes (Signed)
Interpreter assisted this RN during d/c education.

## 2022-03-31 DIAGNOSIS — J441 Chronic obstructive pulmonary disease with (acute) exacerbation: Secondary | ICD-10-CM | POA: Diagnosis not present

## 2022-04-18 ENCOUNTER — Encounter
Admission: RE | Admit: 2022-04-18 | Discharge: 2022-04-18 | Disposition: A | Discharge status: Home / Self Care | Payer: Medicare Other | Source: Ambulatory Visit | Attending: Pulmonary Disease | Admitting: Pulmonary Disease

## 2022-04-18 VITALS — Ht 61.0 in | Wt 110.0 lb

## 2022-04-18 DIAGNOSIS — Z01812 Encounter for preprocedural laboratory examination: Secondary | ICD-10-CM

## 2022-04-18 DIAGNOSIS — E119 Type 2 diabetes mellitus without complications: Secondary | ICD-10-CM

## 2022-04-18 DIAGNOSIS — R0602 Shortness of breath: Secondary | ICD-10-CM

## 2022-04-18 DIAGNOSIS — J9611 Chronic respiratory failure with hypoxia: Secondary | ICD-10-CM

## 2022-04-18 DIAGNOSIS — E1169 Type 2 diabetes mellitus with other specified complication: Secondary | ICD-10-CM

## 2022-04-18 HISTORY — DX: Chronic obstructive pulmonary disease, unspecified: J44.9

## 2022-04-18 HISTORY — DX: Personal history of urinary calculi: Z87.442

## 2022-04-18 NOTE — Patient Instructions (Addendum)
Your procedure is scheduled on: Wednesday April 27, 2022. Report to Day Surgery inside [place] 2nd floor, stop by registration desk before getting on elevator. To find out your arrival time please call [phone] between 1PM - 3PM on Tuesday April 26, 2022.  Remember: Instructions that are not followed completely may result in serious medical risk,  up to and including death, or upon the discretion of your surgeon and anesthesiologist your  surgery may need to be rescheduled.     _X__ 1. Do not eat food or drink fluids after midnight the night before your procedure.                 No chewing gum or hard candies.  __X__2.  On the morning of surgery brush your teeth with toothpaste and water, you                may rinse your mouth with mouthwash if you wish.  Do not swallow any toothpaste or mouthwash.     _X__ 3.  No Alcohol for 24 hours before or after surgery.   _X__ 4.  Do Not Smoke or use e-cigarettes For 24 Hours Prior to Your Surgery.                 Do not use any chewable tobacco products for at least 6 hours prior to                 Surgery.  _X__  5.  Do not use any recreational drugs (marijuana, cocaine, heroin, ecstasy, MDMA or other)                For at least one week prior to your surgery.  Combination of these drugs with anesthesia                May have life threatening results.  ____  6.  Bring all medications with you on the day of surgery if instructed.   __X__  7.  Notify your doctor if there is any change in your medical condition      (cold, fever, infections).     Do not wear jewelry, make-up, hairpins, clips or nail polish. Do not wear lotions, powders, or perfumes. You may wear deodorant. Do not shave 48 hours prior to surgery. Men may shave face and neck. Do not bring valuables to the hospital.    [facility] is not responsible for any belongings or valuables.  Contacts, dentures or bridgework may not be worn into  surgery. Leave your suitcase in the car. After surgery it may be brought to your room. For patients admitted to the hospital, discharge time is determined by your treatment team.   Patients discharged the day of surgery will not be allowed to drive home.   Make arrangements for someone to be with you for the first 24 hours of your Same Day Discharge.   __X__ Take these medicines the morning of surgery with A SIP OF WATER:    1. rosuvastatin (CRESTOR) 10 MG   2.   3.   4.  5.  6.  ____ Fleet Enema (as directed)   ____ Use CHG Soap (or wipes) as directed  ____ Use Benzoyl Peroxide Gel as instructed  __X__ Use inhalers on the day of surgery  albuterol (VENTOLIN HFA) 108 (90 Base) MCG/ACT inhaler   Fluticasone-Umeclidin-Vilant (TRELEGY ELLIPTA) 100-62.5-25 MCG/INH AEPB   __X__ Stop metFORMIN (GLUCOPHAGE) 500 MG 2 days prior to surgery (Take  last dose Sunday 04/24/22)    ____ Take 1/2 of usual insulin dose the night before surgery. No insulin the morning          of surgery.   ____ Call your PCP, cardiologist, or Pulmonologist if taking Coumadin/Plavix/aspirin and ask when to stop before your surgery.   __X__ One Week prior to surgery- Stop Anti-inflammatories such as Ibuprofen, Aleve, Advil, Motrin, meloxicam (MOBIC), diclofenac, etodolac, ketorolac, Toradol, Daypro, piroxicam, Goody's or BC powders. OK TO USE TYLENOL IF NEEDED   __X__ Do not start any new vitamin and or supplements until after surgery.    __X__ Bring Oxygen  to the hospital.    If you have any questions regarding your pre-procedure instructions,  Please call Pre-admit Testing at [phone]

## 2022-04-23 ENCOUNTER — Emergency Department: Payer: Medicare Other

## 2022-04-23 ENCOUNTER — Inpatient Hospital Stay
Admission: EM | Admit: 2022-04-23 | Discharge: 2022-04-25 | DRG: 872 | Disposition: A | Discharge status: Home / Self Care | Payer: Medicare Other | Attending: Internal Medicine | Admitting: Internal Medicine

## 2022-04-23 ENCOUNTER — Inpatient Hospital Stay: Payer: Medicare Other

## 2022-04-23 DIAGNOSIS — A4189 Other specified sepsis: Secondary | ICD-10-CM | POA: Diagnosis present

## 2022-04-23 DIAGNOSIS — Z79899 Other long term (current) drug therapy: Secondary | ICD-10-CM

## 2022-04-23 DIAGNOSIS — E785 Hyperlipidemia, unspecified: Secondary | ICD-10-CM

## 2022-04-23 DIAGNOSIS — R079 Chest pain, unspecified: Secondary | ICD-10-CM | POA: Diagnosis not present

## 2022-04-23 DIAGNOSIS — Z7951 Long term (current) use of inhaled steroids: Secondary | ICD-10-CM | POA: Diagnosis not present

## 2022-04-23 DIAGNOSIS — J9611 Chronic respiratory failure with hypoxia: Secondary | ICD-10-CM | POA: Diagnosis not present

## 2022-04-23 DIAGNOSIS — J9610 Chronic respiratory failure, unspecified whether with hypoxia or hypercapnia: Secondary | ICD-10-CM | POA: Diagnosis present

## 2022-04-23 DIAGNOSIS — Z8611 Personal history of tuberculosis: Secondary | ICD-10-CM | POA: Diagnosis not present

## 2022-04-23 DIAGNOSIS — A419 Sepsis, unspecified organism: Secondary | ICD-10-CM | POA: Diagnosis not present

## 2022-04-23 DIAGNOSIS — J101 Influenza due to other identified influenza virus with other respiratory manifestations: Secondary | ICD-10-CM

## 2022-04-23 DIAGNOSIS — N400 Enlarged prostate without lower urinary tract symptoms: Secondary | ICD-10-CM | POA: Diagnosis present

## 2022-04-23 DIAGNOSIS — J8410 Pulmonary fibrosis, unspecified: Secondary | ICD-10-CM | POA: Diagnosis present

## 2022-04-23 DIAGNOSIS — E872 Acidosis, unspecified: Secondary | ICD-10-CM | POA: Diagnosis present

## 2022-04-23 DIAGNOSIS — I10 Essential (primary) hypertension: Secondary | ICD-10-CM

## 2022-04-23 DIAGNOSIS — E875 Hyperkalemia: Secondary | ICD-10-CM | POA: Diagnosis not present

## 2022-04-23 DIAGNOSIS — R6520 Severe sepsis without septic shock: Secondary | ICD-10-CM | POA: Diagnosis present

## 2022-04-23 DIAGNOSIS — E119 Type 2 diabetes mellitus without complications: Secondary | ICD-10-CM

## 2022-04-23 DIAGNOSIS — J441 Chronic obstructive pulmonary disease with (acute) exacerbation: Principal | ICD-10-CM

## 2022-04-23 DIAGNOSIS — J9690 Respiratory failure, unspecified, unspecified whether with hypoxia or hypercapnia: Secondary | ICD-10-CM | POA: Diagnosis not present

## 2022-04-23 DIAGNOSIS — Z7984 Long term (current) use of oral hypoglycemic drugs: Secondary | ICD-10-CM

## 2022-04-23 DIAGNOSIS — Z1152 Encounter for screening for COVID-19: Secondary | ICD-10-CM | POA: Diagnosis not present

## 2022-04-23 DIAGNOSIS — Z87891 Personal history of nicotine dependence: Secondary | ICD-10-CM

## 2022-04-23 DIAGNOSIS — R0602 Shortness of breath: Secondary | ICD-10-CM

## 2022-04-23 DIAGNOSIS — R0781 Pleurodynia: Secondary | ICD-10-CM | POA: Diagnosis not present

## 2022-04-23 DIAGNOSIS — E1165 Type 2 diabetes mellitus with hyperglycemia: Secondary | ICD-10-CM | POA: Diagnosis not present

## 2022-04-23 DIAGNOSIS — J449 Chronic obstructive pulmonary disease, unspecified: Secondary | ICD-10-CM | POA: Insufficient documentation

## 2022-04-23 LAB — LACTIC ACID, PLASMA
Lactic Acid, Venous: 3.2 mmol/L (ref 0.5–1.9)
Lactic Acid, Venous: 5.2 mmol/L (ref 0.5–1.9)

## 2022-04-23 LAB — CBC
HCT: 43.7 % (ref 39.0–52.0)
Hemoglobin: 14.7 g/dL (ref 13.0–17.0)
MCH: 31.1 pg (ref 26.0–34.0)
MCHC: 33.6 g/dL (ref 30.0–36.0)
MCV: 92.6 fL (ref 80.0–100.0)
Platelets: 230 10*3/uL (ref 150–400)
RBC: 4.72 MIL/uL (ref 4.22–5.81)
RDW: 12 % (ref 11.5–15.5)
WBC: 6.9 10*3/uL (ref 4.0–10.5)
nRBC: 0 % (ref 0.0–0.2)

## 2022-04-23 LAB — BASIC METABOLIC PANEL
Anion gap: 10 (ref 5–15)
BUN: 10 mg/dL (ref 8–23)
CO2: 22 mmol/L (ref 22–32)
Calcium: 8.5 mg/dL — ABNORMAL LOW (ref 8.9–10.3)
Chloride: 99 mmol/L (ref 98–111)
Creatinine, Ser: 0.96 mg/dL (ref 0.61–1.24)
GFR, Estimated: >60 mL/min (ref >60)
Glucose, Bld: 120 mg/dL — ABNORMAL HIGH (ref 70–99)
Potassium: 3.9 mmol/L (ref 3.5–5.1)
Sodium: 131 mmol/L — ABNORMAL LOW (ref 135–145)

## 2022-04-23 LAB — RESP PANEL BY RT-PCR (RSV, FLU A&B, COVID)  RVPGX2
Influenza A by PCR: NEGATIVE
Influenza B by PCR: POSITIVE — AB
Resp Syncytial Virus by PCR: NEGATIVE
SARS Coronavirus 2 by RT PCR: NEGATIVE

## 2022-04-23 LAB — CBG MONITORING, ED: Glucose-Capillary: 358 mg/dL — ABNORMAL HIGH (ref 70–99)

## 2022-04-23 LAB — TROPONIN I (HIGH SENSITIVITY): Troponin I (High Sensitivity): 4 ng/L (ref <18)

## 2022-04-23 LAB — PROCALCITONIN: Procalcitonin: <0.1 ng/mL

## 2022-04-23 MED ORDER — IPRATROPIUM-ALBUTEROL 0.5-2.5 (3) MG/3ML IN SOLN
3.0000 mL | RESPIRATORY_TRACT | Status: DC
  Administered 2022-04-23 – 2022-04-24 (×7): 3 mL via RESPIRATORY_TRACT
  Filled 2022-04-23 (×7): qty 3

## 2022-04-23 MED ORDER — SODIUM CHLORIDE 0.9 % IV SOLN: INTRAVENOUS | Status: AC

## 2022-04-23 MED ORDER — ACETAMINOPHEN 325 MG PO TABS: 650.0000 mg | ORAL_TABLET | Freq: Four times a day (QID) | ORAL | Status: DC | PRN

## 2022-04-23 MED ORDER — ROSUVASTATIN CALCIUM 10 MG PO TABS
10.0000 mg | ORAL_TABLET | Freq: Every day | ORAL | Status: DC
  Administered 2022-04-23 – 2022-04-25 (×3): 10 mg via ORAL
  Filled 2022-04-23 (×3): qty 1

## 2022-04-23 MED ORDER — MECLIZINE HCL 25 MG PO TABS: 25.0000 mg | ORAL_TABLET | Freq: Three times a day (TID) | ORAL | Status: DC | PRN

## 2022-04-23 MED ORDER — HYDRALAZINE HCL 20 MG/ML IJ SOLN
5.0000 mg | INTRAMUSCULAR | Status: DC | PRN
  Filled 2022-04-23: qty 1

## 2022-04-23 MED ORDER — OXYCODONE-ACETAMINOPHEN 5-325 MG PO TABS
1.0000 | ORAL_TABLET | ORAL | Status: DC | PRN
  Administered 2022-04-25: 1 via ORAL
  Filled 2022-04-23: qty 1

## 2022-04-23 MED ORDER — ONDANSETRON HCL 4 MG/2ML IJ SOLN: 4.0000 mg | Freq: Three times a day (TID) | INTRAMUSCULAR | Status: DC | PRN

## 2022-04-23 MED ORDER — METHYLPREDNISOLONE SODIUM SUCC 125 MG IJ SOLR
125.0000 mg | Freq: Once | INTRAMUSCULAR | Status: AC
  Administered 2022-04-23: 125 mg via INTRAVENOUS
  Filled 2022-04-23: qty 2

## 2022-04-23 MED ORDER — SODIUM CHLORIDE 0.9 % IV BOLUS
1000.0000 mL | Freq: Once | INTRAVENOUS | Status: AC
  Administered 2022-04-23: 1000 mL via INTRAVENOUS

## 2022-04-23 MED ORDER — OSELTAMIVIR PHOSPHATE 30 MG PO CAPS
30.0000 mg | ORAL_CAPSULE | Freq: Two times a day (BID) | ORAL | Status: DC
  Administered 2022-04-24 – 2022-04-25 (×3): 30 mg via ORAL
  Filled 2022-04-23 (×5): qty 1

## 2022-04-23 MED ORDER — METOPROLOL TARTRATE 5 MG/5ML IV SOLN: 5.0000 mg | INTRAVENOUS | Status: DC | PRN

## 2022-04-23 MED ORDER — ENOXAPARIN SODIUM 40 MG/0.4ML IJ SOSY
40.0000 mg | PREFILLED_SYRINGE | INTRAMUSCULAR | Status: DC
  Administered 2022-04-23 – 2022-04-24 (×2): 40 mg via SUBCUTANEOUS
  Filled 2022-04-23 (×2): qty 0.4

## 2022-04-23 MED ORDER — INSULIN ASPART 100 UNIT/ML IJ SOLN
0.0000 [IU] | Freq: Three times a day (TID) | INTRAMUSCULAR | Status: DC
  Administered 2022-04-24: 5 [IU] via SUBCUTANEOUS
  Administered 2022-04-24 (×2): 3 [IU] via SUBCUTANEOUS
  Administered 2022-04-25: 7 [IU] via SUBCUTANEOUS
  Administered 2022-04-25: 2 [IU] via SUBCUTANEOUS
  Filled 2022-04-23 (×5): qty 1

## 2022-04-23 MED ORDER — INSULIN ASPART 100 UNIT/ML IJ SOLN
0.0000 [IU] | Freq: Every day | INTRAMUSCULAR | Status: DC
  Administered 2022-04-23: 5 [IU] via SUBCUTANEOUS
  Administered 2022-04-24: 2 [IU] via SUBCUTANEOUS
  Filled 2022-04-23 (×2): qty 1

## 2022-04-23 MED ORDER — IPRATROPIUM-ALBUTEROL 0.5-2.5 (3) MG/3ML IN SOLN
3.0000 mL | Freq: Once | RESPIRATORY_TRACT | Status: AC
  Administered 2022-04-23: 3 mL via RESPIRATORY_TRACT
  Filled 2022-04-23: qty 3

## 2022-04-23 MED ORDER — IOHEXOL 350 MG/ML SOLN
75.0000 mL | Freq: Once | INTRAVENOUS | Status: AC | PRN
  Administered 2022-04-23: 75 mL via INTRAVENOUS

## 2022-04-23 MED ORDER — METHYLPREDNISOLONE SODIUM SUCC 40 MG IJ SOLR
40.0000 mg | Freq: Two times a day (BID) | INTRAMUSCULAR | Status: DC
  Administered 2022-04-24 – 2022-04-25 (×3): 40 mg via INTRAVENOUS
  Filled 2022-04-23 (×3): qty 1

## 2022-04-23 MED ORDER — ALBUTEROL SULFATE (2.5 MG/3ML) 0.083% IN NEBU: 2.5000 mg | INHALATION_SOLUTION | RESPIRATORY_TRACT | Status: DC | PRN

## 2022-04-23 MED ORDER — OSELTAMIVIR PHOSPHATE 75 MG PO CAPS
75.0000 mg | ORAL_CAPSULE | Freq: Once | ORAL | Status: AC
  Administered 2022-04-23: 75 mg via ORAL
  Filled 2022-04-23: qty 1

## 2022-04-23 MED ORDER — SODIUM CHLORIDE 0.9 % IV BOLUS
250.0000 mL | Freq: Once | INTRAVENOUS | Status: AC
  Administered 2022-04-23: 250 mL via INTRAVENOUS

## 2022-04-23 MED ORDER — DM-GUAIFENESIN ER 30-600 MG PO TB12: 1.0000 | ORAL_TABLET | Freq: Two times a day (BID) | ORAL | Status: DC | PRN

## 2022-04-23 NOTE — ED Notes (Signed)
Messaged NP about pt's increased lactic.

## 2022-04-23 NOTE — H&P (Signed)
History and Physical    Jonathan Willis UDJ:[id number] DOB: 31-Mar-1945 DOA: 04/23/2022  Referring MD/NP/PA:   PCP: Jearld Fenton, NP   Patient coming from:  The patient is coming from home.    Chief Complaint: SOB, chest pain  HPI: Jonathan Willis is a 77 y.o. male with medical history significant of COPD on 2-3L of prn O2, hypertension, hyperlipidemia, diabetes mellitus, lower GI bleeding, BPH, carotid artery stenosis, tuberculosis in the past, granulomatous lung disease, who presents with shortness breath and chest pain.  Patient speaks [place], history is obtained with his son's help who speaks good [place].  Patient states that he has a form present for more than 2 days, which has been progressively worsening.  Patient has a dry cough, subjective fever and chills.  His body temperature is 98.6 today in ED.  Patient also reports chest pain, which is located in the front chest, moderate, sharp, pleuritic, aggravated with deep breath, nonradiating.  Denies tenderness in calf areas.  No nausea, vomiting, diarrhea or abdominal pain.  No symptoms of UTI.  Of note, patient has granulomatous lung disease, following up with Dr. Lanney Gins of pulmonology.  Patient is scheduled for bronchoscopy on Monday.   Data reviewed independently and ED Course: pt was found to have positive influenza B, WBC 6.9, troponin 4  --> 4, GFR> 60, temperature normal, blood pressure 138/70, heart rate 100-1 20, RR 21, 28, oxygen saturation 96% on room air.  Patient is admitted to telemetry bed as inpatient.  Chest x-ray: 1. No active cardiopulmonary disease. 2. Calcified nodule in the right upper to mid lung is stable.   EKG: I have personally reviewed.  Sinus rhythm, QTc 426, LAE, early R wave progression   Review of Systems:   General: Has subjective fevers, chills, no body weight gain, has fatigue HEENT: no blurry vision, hearing changes or sore throat Respiratory: no dyspnea, coughing, wheezing CV: no chest pain, no  palpitations GI: no nausea, vomiting, abdominal pain, diarrhea, constipation GU: no dysuria, burning on urination, increased urinary frequency, hematuria  Ext: no leg edema Neuro: no unilateral weakness, numbness, or tingling, no vision change or hearing loss Skin: no rash, no skin tear. MSK: No muscle spasm, no deformity, no limitation of range of movement in spin Heme: No easy bruising.  Travel history: No recent long distant travel.   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 His smoking use included cigarettes. He has a 66.00 pack-year smoking history. He has quit using smokeless tobacco. He reports current alcohol use. He reports that he does not use drugs.  Family History:  Family History  Problem Relation Age of Onset   COPD Neg Hx    Diabetes Mellitus II Neg  Hx    Hypertension Neg Hx      Prior to Admission medications   Medication Sig Start Date End Date Taking? Authorizing Provider  Accu-Chek Softclix Lancets lancets USE TO TEST ONCE DAILY 09/22/20   Jearld Fenton, NP  albuterol (VENTOLIN HFA) 108 (90 Base) MCG/ACT inhaler Inhale 2 puffs into the lungs every 6 (six) hours as needed for wheezing or shortness of breath. 11/11/20   Jearld Fenton, NP  Blood Glucose  Monitoring Suppl (ACCU-CHEK AVIVA CONNECT) w/Device KIT 1 Device by Does not apply route daily. Dx E11.4 09/22/20   Jearld Fenton, NP  Fluticasone-Umeclidin-Vilant (TRELEGY ELLIPTA) 100-62.5-25 MCG/INH AEPB Inhale 1 puff into the lungs daily. 04/16/20   Malfi, Lupita Raider, FNP  ipratropium-albuterol (DUONEB) 0.5-2.5 (3) MG/3ML SOLN Take 3 mLs by nebulization.    [provider]  meclizine (ANTIVERT) 25 MG tablet Take 1 tablet (25 mg total) by mouth 3 (three) times daily as needed for dizziness. 02/22/22   Jearld Fenton, NP  metFORMIN (GLUCOPHAGE) 500 MG tablet Take 1 tablet (500 mg total) by mouth 2 (two) times daily with a meal. 02/24/22   Baity, Coralie Keens, NP  OXYGEN Inhale into the lungs. 3 Liters    [provider]  predniSONE (DELTASONE) 2.5 MG tablet Take 2.5 mg by mouth daily with breakfast.    [provider]  predniSONE (STERAPRED UNI-PAK 21 TAB) 10 MG (21) TBPK tablet As directed on packaging Patient not taking: Reported on 04/18/2022 03/30/22   Naaman Plummer, MD  rosuvastatin (CRESTOR) 10 MG tablet Take 1 tablet (10 mg total) by mouth daily. 02/24/22   Jearld Fenton, NP  sulfamethoxazole-trimethoprim (BACTRIM) 400-80 MG tablet Take 1 tablet by mouth 3 (three) times a week.    [provider]    Physical Exam: Vitals:   04/23/22 1555 04/23/22 1601 04/23/22 1602 04/23/22 1700  BP: (!) 134/94 (!) 134/94  138/70  Pulse: (!) 109 (!) 107  (!) 111  Resp: 18 (!) 30  (!) 21  Temp: 98.6 F (37 C) 98.6 F (37 C)    TempSrc:  Oral    SpO2: 95% 94%  96%  Weight:   49.9 kg   Height:   '5\' 1"'$  (1.549 m)    General: Not in acute distress HEENT:       Eyes: PERRL, EOMI, no scleral icterus.       ENT: No discharge from the ears and nose, no pharynx injection, no tonsillar enlargement.        Neck: No JVD, no bruit, no mass felt. Heme: No neck lymph node enlargement. Cardiac: S1/S2, RRR, No murmurs, No gallops or rubs. Respiratory: Has wheezing  bilaterally GI: Soft, nondistended, nontender, no rebound pain, no organomegaly, BS present. GU: No hematuria Ext: No pitting leg edema bilaterally. 1+DP/PT pulse bilaterally. Musculoskeletal: No joint deformities, No joint redness or warmth, no limitation of ROM in spin. Skin: No rashes.  Neuro: Alert, oriented X3, cranial nerves II-XII grossly intact, moves all extremities normally.  Psych: Patient is not psychotic, no suicidal or hemocidal ideation.  Labs on Admission: I have personally reviewed following labs and imaging studies  CBC: Recent Labs  Lab 04/23/22 1603  WBC 6.9  HGB 14.7  HCT 43.7  MCV 92.6  PLT 919   Basic Metabolic Panel: Recent Labs  Lab 04/23/22 1603  NA 131*  K 3.9  CL 99  CO2 22  GLUCOSE 120*  BUN 10  CREATININE 0.96  CALCIUM  8.5*   GFR: Estimated Creatinine Clearance: 44.8 mL/min (by C-G formula based on SCr of 0.96 mg/dL). Liver Function Tests: No results for input(s): "AST", "ALT", "ALKPHOS", "BILITOT", "PROT", "ALBUMIN" in the last 168 hours. No results for input(s): "LIPASE", "AMYLASE" in the last 168 hours. No results for input(s): "AMMONIA" in the last 168 hours. Coagulation Profile: No results for input(s): "INR", "PROTIME" in the last 168 hours. Cardiac Enzymes: No results for input(s): "CKTOTAL", "CKMB", "CKMBINDEX", "TROPONINI" in the last 168 hours. BNP (last 3 results) No results for input(s): "PROBNP" in the last 8760 hours. HbA1C: No results for input(s): "HGBA1C" in the last 72 hours. CBG: No results for input(s): "GLUCAP" in the last 168 hours. Lipid Profile: No results for input(s): "CHOL", "HDL", "LDLCALC", "TRIG", "CHOLHDL", "LDLDIRECT" in the last 72 hours. Thyroid Function Tests: No results for input(s): "TSH", "T4TOTAL", "FREET4", "T3FREE", "THYROIDAB" in the last 72 hours. Anemia Panel: No results for input(s): "VITAMINB12", "FOLATE", "FERRITIN", "TIBC", "IRON", "RETICCTPCT" in the last 72 hours. Urine analysis:     Component Value Date/Time   COLORURINE STRAW (A) 05/13/2021 0852   APPEARANCEUR CLEAR (A) 05/13/2021 0852   APPEARANCEUR Clear 11/04/2020 1355   LABSPEC 1.011 05/13/2021 0852   LABSPEC 1.023 02/01/2014 2216   PHURINE 7.0 05/13/2021 0852   GLUCOSEU NEGATIVE 05/13/2021 0852   GLUCOSEU 50 mg/dL 02/01/2014 2216   HGBUR NEGATIVE 05/13/2021 0852   BILIRUBINUR NEGATIVE 05/13/2021 0852   BILIRUBINUR Negative 11/04/2020 1355   BILIRUBINUR Negative 02/01/2014 2216   KETONESUR NEGATIVE 05/13/2021 0852   PROTEINUR NEGATIVE 05/13/2021 0852   UROBILINOGEN 0.2 07/10/2019 0845   NITRITE NEGATIVE 05/13/2021 0852   LEUKOCYTESUR NEGATIVE 05/13/2021 0852   LEUKOCYTESUR Negative 02/01/2014 2216   Sepsis Labs: '@LABRCNTIP'$ (procalcitonin:4,lacticidven:4) ) Recent Results (from the past 240 hour(s))  Resp panel by RT-PCR (RSV, Flu A&B, Covid) Anterior Nasal Swab     Status: Abnormal   Collection Time: 04/23/22  4:22 PM   Specimen: Anterior Nasal Swab  Result Value Ref Range Status   SARS Coronavirus 2 by RT PCR NEGATIVE NEGATIVE Final    Comment: (NOTE) SARS-CoV-2 target nucleic acids are NOT DETECTED.  The SARS-CoV-2 RNA is generally detectable in upper respiratory specimens during the acute phase of infection. The lowest concentration of SARS-CoV-2 viral copies this assay can detect is 138 copies/mL. A negative result does not preclude SARS-Cov-2 infection and should not be used as the sole basis for treatment or other patient management decisions. A negative result may occur with  improper specimen collection/handling, submission of specimen other than nasopharyngeal swab, presence of viral mutation(s) within the areas targeted by this assay, and inadequate number of viral copies(<138 copies/mL). A negative result must be combined with clinical observations, patient history, and epidemiological information. The expected result is Negative.  Fact Sheet for Patients:   EntrepreneurPulse.com.au  Fact Sheet for Healthcare Providers:  IncredibleEmployment.be  This test is no t yet approved or cleared by the [place] FDA and  has been authorized for detection and/or diagnosis of SARS-CoV-2 by FDA under an Emergency Use Authorization (EUA). This EUA will remain  in effect (meaning this test can be used) for the duration of the COVID-19 declaration under Section 564(b)(1) of the Act, 21 U.S.C.section 360bbb-3(b)(1), unless the authorization is terminated  or revoked sooner.       Influenza A by PCR NEGATIVE NEGATIVE Final   Influenza B by PCR POSITIVE (A) NEGATIVE Final    Comment: (NOTE) The Xpert Xpress SARS-CoV-2/FLU/RSV plus assay is intended as  an aid in the diagnosis of influenza from Nasopharyngeal swab specimens and should not be used as a sole basis for treatment. Nasal washings and aspirates are unacceptable for Xpert Xpress SARS-CoV-2/FLU/RSV testing.  Fact Sheet for Patients: EntrepreneurPulse.com.au  Fact Sheet for Healthcare Providers: IncredibleEmployment.be  This test is not yet approved or cleared by the [place] FDA and has been authorized for detection and/or diagnosis of SARS-CoV-2 by FDA under an Emergency Use Authorization (EUA). This EUA will remain in effect (meaning this test can be used) for the duration of the COVID-19 declaration under Section 564(b)(1) of the Act, 21 U.S.C. section 360bbb-3(b)(1), unless the authorization is terminated or revoked.     Resp Syncytial Virus by PCR NEGATIVE NEGATIVE Final    Comment: (NOTE) Fact Sheet for Patients: EntrepreneurPulse.com.au  Fact Sheet for Healthcare Providers: IncredibleEmployment.be  This test is not yet approved or cleared by the [place] FDA and has been authorized for detection and/or diagnosis of SARS-CoV-2 by FDA under an Emergency Use  Authorization (EUA). This EUA will remain in effect (meaning this test can be used) for the duration of the COVID-19 declaration under Section 564(b)(1) of the Act, 21 U.S.C. section 360bbb-3(b)(1), unless the authorization is terminated or revoked.  Performed at [facility], [address] [place], [place] [address]      Radiological Exams on Admission: DG Chest 2 View  Result Date: 04/23/2022 CLINICAL DATA:  Chronic respiratory failure. Worsening shortness of breath. EXAM: CHEST - 2 VIEW COMPARISON:  Chest x-ray March 30, 2022 FINDINGS: There is a calcified nodule in the right upper to mid lung. Chronic volume loss on the right and post surgical changes on the right. The left lung is clear. No pneumothorax. No suspicious nodules or masses. The cardiomediastinal silhouette is stable. IMPRESSION: 1. No active cardiopulmonary disease. 2. Calcified nodule in the right upper to mid lung is stable. Electronically Signed   By: Dorise Bullion III M.D.   On: 04/23/2022 17:06      Assessment/Plan Principal Problem:   Influenza B Active Problems:   COPD exacerbation (HCC)   Pleuritic chest pain   Essential hypertension   Diabetes mellitus without complication (HCC)   HLD (hyperlipidemia)   Granulomatous lung disease ([place])   Assessment and Plan:  Influenza B and Flu B-induced COPD exacerbation ([place]): Patient does not have new oxygen requirement, but has tachycardia.  Also reports pleuritic chest pain.  Troponin negative x 2.  Will need to rule out PE.  - will admit to tele bed as inpatient -Bronchodilators -Solu-Medrol 40 mg IV bid - Tamiflu started -Mucinex for cough  -Incentive spirometry -sputum culture -Nasal cannula oxygen as needed to maintain O2 saturation 93% or greater - F/u CTA to r/o PE.  Pleuritic chest pain: trop 4 -->4 -prn tylenol and percoct -f/u CTA to r/o PE  Essential hypertension: -IV hydralazine as needed  Diabetes mellitus without  complication ([place]): Recent A1c 6.9, well-controlled.  Patient taking metformin -SSI  HLD (hyperlipidemia) -Crestor  Granulomatous lung disease ([place]) -Patient is scheduled for bronchoscopy on Monday but Dr. Lanney Gins. He is not on-call today. If pt stays to Monday, may need to inform Dr. Lanney Gins for the procedure.      DVT ppx: SQ Lovenox  Code Status: Full code  Family Communication:  Yes, patient's son  Disposition Plan:  Anticipate discharge back to previous environment  Consults called:  none  Admission status and Level of care: Telemetry Medical:   as inpt  Dispo: The patient is from: Home              Anticipated d/c is to: Home              Anticipated d/c date is: 2 days              Patient currently is not medically stable to d/c.    Severity of Illness:  The appropriate patient status for this patient is INPATIENT. Inpatient status is judged to be reasonable and necessary in order to provide the required intensity of service to ensure the patient's safety. The patient's presenting symptoms, physical exam findings, and initial radiographic and laboratory data in the context of their chronic comorbidities is felt to place them at high risk for further clinical deterioration. Furthermore, it is not anticipated that the patient will be medically stable for discharge from the hospital within 2 midnights of admission.   * I certify that at the point of admission it is my clinical judgment that the patient will require inpatient hospital care spanning beyond 2 midnights from the point of admission due to high intensity of service, high risk for further deterioration and high frequency of surveillance required.*       Date of Service 04/23/2022    Ivor Costa [facility]   If 7PM-7AM, please contact night-coverage [email or web address] 04/23/2022, 6:36 PM

## 2022-04-23 NOTE — ED Provider Notes (Signed)
[facility] Provider Note   Event Date/Time   First MD Initiated Contact with Patient 04/23/22 1610     (approximate) History  Shortness of Breath (Chronic respiratory failure and COPD patient presents today with worsening shortness of breath x 2 days; Patient is not chronically on oxygen but wears is as he needs to at home; Scheduled for a bronchial washing on Monday)  HPI Jonathan Willis is a 77 y.o. male with a stated past medical history of COPD who presents for worsening shortness of breath, fever, and productive cough.  Patient states that he is symptoms have been worsening over the last 2 days.  Patient denies wearing any oxygen at baseline but is currently requiring 2 L for symptoms of air hunger.  Patient denies any recent travel or sick contacts.  Of note patient was scheduled for a bronchoscopy in 2 days for evaluation and diagnosis of a pulmonary mass. ROS: Patient currently denies any vision changes, tinnitus, difficulty speaking, facial droop, sore throat, chest pain, abdominal pain, nausea/vomiting/diarrhea, dysuria, or weakness/numbness/paresthesias in any extremity   Physical Exam  Triage Vital Signs: ED Triage Vitals  Enc Vitals Group     BP 04/23/22 1555 (!) 134/94     Pulse Rate 04/23/22 1555 (!) 109     Resp 04/23/22 1555 18     Temp 04/23/22 1555 98.6 F (37 C)     Temp Source 04/23/22 1601 Oral     SpO2 04/23/22 1555 95 %     Weight 04/23/22 1602 110 lb 0.2 oz (49.9 kg)     Height 04/23/22 1602 '5\' 1"'$  (1.549 m)     Head Circumference --      Peak Flow --      Pain Score 04/23/22 1601 9     Pain Loc --      Pain Edu? --      Excl. in [place]? --    Most recent vital signs: Vitals:   04/23/22 2130 04/23/22 2200  BP: 123/85 (!) 105/42  Pulse: (!) 116 (!) 104  Resp: 19 17  Temp:    SpO2: 99% 98%   General: Awake, oriented x4. CV:  Good peripheral perfusion.  Resp:  Increased effort.  Expiratory wheezing over bilateral lung fields Abd:  No  distention.  Other:  Elderly Asian male laying in bed with 2 L nasal cannula in place in mild respiratory distress ED Results / Procedures / Treatments  Labs (all labs ordered are listed, but only abnormal results are displayed) Labs Reviewed  RESP PANEL BY RT-PCR (RSV, FLU A&B, COVID)  RVPGX2 - Abnormal; Notable for the following components:      Result Value   Influenza B by PCR POSITIVE (*)    All other components within normal limits  BASIC METABOLIC PANEL - Abnormal; Notable for the following components:   Sodium 131 (*)    Glucose, Bld 120 (*)    Calcium 8.5 (*)    All other components within normal limits  LACTIC ACID, PLASMA - Abnormal; Notable for the following components:   Lactic Acid, Venous 3.2 (*)    All other components within normal limits  LACTIC ACID, PLASMA - Abnormal; Notable for the following components:   Lactic Acid, Venous 5.2 (*)    All other components within normal limits  CBG MONITORING, ED - Abnormal; Notable for the following components:   Glucose-Capillary 358 (*)    All other components within normal limits  EXPECTORATED SPUTUM ASSESSMENT Sid Falcon  STAIN, RFLX TO RESP C  CBC  PROCALCITONIN  BASIC METABOLIC PANEL  CBC  TROPONIN I (HIGH SENSITIVITY)   EKG ED ECG REPORT I, Naaman Plummer, the attending physician, personally viewed and interpreted this ECG. Date: 04/23/2022 EKG Time: 1559 Rate: 108 Rhythm: Tachycardic sinus rhythm QRS Axis: normal Intervals: normal ST/T Wave abnormalities: normal Narrative Interpretation: Tachycardic sinus rhythm.  No evidence of acute ischemia RADIOLOGY ED MD interpretation: 2 view chest x-ray interpreted by me shows no evidence of acute abnormalities including no pneumonia, pneumothorax, or widened mediastinum -Agree with radiology assessment Official radiology report(s): CT Angio Chest Pulmonary Embolism (PE) W or WO Contrast  Result Date: 04/23/2022 CLINICAL DATA:  Chest pain and shortness of breath. EXAM:  CT ANGIOGRAPHY CHEST WITH CONTRAST TECHNIQUE: Multidetector CT imaging of the chest was performed using the standard protocol during bolus administration of intravenous contrast. Multiplanar CT image reconstructions and MIPs were obtained to evaluate the vascular anatomy. RADIATION DOSE REDUCTION: This exam was performed according to the departmental dose-optimization program which includes automated exposure control, adjustment of the mA and/or kV according to patient size and/or use of iterative reconstruction technique. CONTRAST:  45m OMNIPAQUE IOHEXOL 350 MG/ML SOLN COMPARISON:  04/19/2021 FINDINGS: Cardiovascular: Stable enlarged heart. Stable extensive atheromatous changes in the thoracic aorta with an appearance suggesting a partial short-segment intimal flap in the distal arch. No interval aortic dissection or aneurysm. Normally opacified pulmonary arteries with no pulmonary arterial filling defects seen. Atheromatous calcifications, including the coronary arteries and aorta. Mediastinum/Nodes: No enlarged mediastinal, hilar, or axillary lymph nodes. Thyroid gland, trachea, and esophagus demonstrate no significant findings. Lungs/Pleura: Stable right upper lobe calcified granuloma and partially calcified chronic scarring/atelectasis. Stable linear scarring in the right lower lobe. Interval minimal ground-glass interstitial opacity throughout the left lung right lower lobe. Decreased depth of inspiration compared to the previous examination suggesting that this due to crowding of the lung markings. No pleural fluid. Upper Abdomen: Diffuse low density of the liver. Musculoskeletal: Mild thoracic and lower cervical spine degenerative changes. Review of the MIP images confirms the above findings. IMPRESSION: 1. No pulmonary emboli. 2. Interval minimal ground-glass interstitial opacity throughout the left lung and right lower lobe. This is most likely due to a decreased inspiration on the current study. Mild  interstitial pulmonary edema or pneumonitis are less likely considerations. 3. Stable cardiomegaly. 4. Stable extensive atheromatous changes in the thoracic aorta with an appearance suggesting a partial short-segment intimal flap in the distal arch without propagation. 5. Stable diffuse hepatic steatosis. 6. Calcific coronary artery and aortic atherosclerosis. Aortic Atherosclerosis (ICD10-I70.0). Electronically Signed   By: SClaudie ReveringM.D.   On: 04/23/2022 19:20   DG Chest 2 View  Result Date: 04/23/2022 CLINICAL DATA:  Chronic respiratory failure. Worsening shortness of breath. EXAM: CHEST - 2 VIEW COMPARISON:  Chest x-ray March 30, 2022 FINDINGS: There is a calcified nodule in the right upper to mid lung. Chronic volume loss on the right and post surgical changes on the right. The left lung is clear. No pneumothorax. No suspicious nodules or masses. The cardiomediastinal silhouette is stable. IMPRESSION: 1. No active cardiopulmonary disease. 2. Calcified nodule in the right upper to mid lung is stable. Electronically Signed   By: DDorise BullionIII M.D.   On: 04/23/2022 17:06   PROCEDURES: Critical Care performed: No .1-3 Lead EKG Interpretation  Performed by: BNaaman Plummer MD Authorized by: BNaaman Plummer MD     Interpretation: abnormal     ECG rate:  104   ECG rate assessment: tachycardic     Rhythm: sinus tachycardia     Ectopy: none     Conduction: normal    MEDICATIONS ORDERED IN ED: Medications  albuterol (PROVENTIL) (2.5 MG/3ML) 0.083% nebulizer solution 2.5 mg (has no administration in time range)  ipratropium-albuterol (DUONEB) 0.5-2.5 (3) MG/3ML nebulizer solution 3 mL (3 mLs Nebulization Given 04/23/22 1957)  dextromethorphan-guaiFENesin (MUCINEX DM) 30-600 MG per 12 hr tablet 1 tablet (has no administration in time range)  ondansetron (ZOFRAN) injection 4 mg (has no administration in time range)  acetaminophen (TYLENOL) tablet 650 mg (has no administration in time  range)  hydrALAZINE (APRESOLINE) injection 5 mg (has no administration in time range)  oxyCODONE-acetaminophen (PERCOCET/ROXICET) 5-325 MG per tablet 1 tablet (has no administration in time range)  metoprolol tartrate (LOPRESSOR) injection 5 mg (has no administration in time range)  enoxaparin (LOVENOX) injection 40 mg (40 mg Subcutaneous Given 04/23/22 2138)  methylPREDNISolone sodium succinate (SOLU-MEDROL) 40 mg/mL injection 40 mg (has no administration in time range)  oseltamivir (TAMIFLU) capsule 30 mg (has no administration in time range)  insulin aspart (novoLOG) injection 0-9 Units (has no administration in time range)  insulin aspart (novoLOG) injection 0-5 Units (5 Units Subcutaneous Given 04/23/22 2138)  rosuvastatin (CRESTOR) tablet 10 mg (10 mg Oral Given 04/23/22 1958)  meclizine (ANTIVERT) tablet 25 mg (has no administration in time range)  0.9 %  sodium chloride infusion (has no administration in time range)  ipratropium-albuterol (DUONEB) 0.5-2.5 (3) MG/3ML nebulizer solution 3 mL (3 mLs Nebulization Given 04/23/22 1619)  methylPREDNISolone sodium succinate (SOLU-MEDROL) 125 mg/2 mL injection 125 mg (125 mg Intravenous Given 04/23/22 1619)  oseltamivir (TAMIFLU) capsule 75 mg (75 mg Oral Given 04/23/22 1813)  sodium chloride 0.9 % bolus 1,000 mL (0 mLs Intravenous Stopped 04/23/22 1943)  iohexol (OMNIPAQUE) 350 MG/ML injection 75 mL (75 mLs Intravenous Contrast Given 04/23/22 1854)  sodium chloride 0.9 % bolus 250 mL (250 mLs Intravenous New Bag/Given 04/23/22 2243)   IMPRESSION / MDM / ASSESSMENT AND PLAN / ED COURSE  I reviewed the triage vital signs and the nursing notes.                             The patient is on the cardiac monitor to evaluate for evidence of arrhythmia and/or significant heart rate changes. Patient's presentation is most consistent with acute presentation with potential threat to life or bodily function. The patient appears to be suffering from a  moderate/severe exacerbation of COPD in the setting of influenza B infection  Based on the history, exam, CXR/EKG reviewed by me, and further workup I dont suspect any other emergent cause of this presentation, such as pneumonia, acute coronary syndrome, congestive heart failure, pulmonary embolism, or pneumothorax.  ED Interventions: bronchodilators, steroids, reassess  Reassessment: After treatment, the patients shortness of breath is improving but patient is still requiring supplemental oxygenation  Disposition: Admit   FINAL CLINICAL IMPRESSION(S) / ED DIAGNOSES   Final diagnoses:  COPD exacerbation ([place])  Influenza B   Rx / DC Orders   ED Discharge Orders     None      Note:  This document was prepared using Dragon voice recognition software and may include unintentional dictation errors.   Naaman Plummer, MD 04/24/22 [phone]

## 2022-04-23 NOTE — Progress Notes (Signed)
PHARMACY NOTE:  ANTIMICROBIAL RENAL DOSAGE ADJUSTMENT  Current antimicrobial regimen includes a mismatch between antimicrobial dosage and estimated renal function.  As per policy approved by the Pharmacy & Therapeutics and Medical Executive Committees, the antimicrobial dosage will be adjusted accordingly.  Current antimicrobial dosage:  Tamiflu 75 mg po BID   Indication: Influenza  Renal Function:  Estimated Creatinine Clearance: 44.8 mL/min (by C-G formula based on SCr of 0.96 mg/dL). '[]'$      On intermittent HD, scheduled: '[]'$      On CRRT    Antimicrobial dosage has been changed to:  75 mg po x 1, then 30 mg po BID  Additional comments:   Thank you for allowing pharmacy to be a part of this patient's care.  Lorin Picket, [facility] 04/23/2022 6:32 PM

## 2022-04-23 NOTE — ED Triage Notes (Addendum)
Chronic respiratory failure and COPD patient presents today with worsening shortness of breath x 2 days; Patient is not chronically on oxygen but wears is as he needs to at home; Scheduled for a bronchial washing on Monday

## 2022-04-23 NOTE — Progress Notes (Addendum)
CROSS COVER NOTE  NAME: Jonathan Willis MRN: [id number] DOB : 1945-04-19   \ HPI/Events of Note   Nurse reporting lactic acid of 3.2 on this patient with influenza B, pleuritic chest pain and IV steroids and COPD exacerbation  Assessment and  Interventions   Assessment: Patient meets sepsis criteria with heart rate 125 and resp rate 24.  He has no leukocytosis and procal <0.10 He is on tamiflu CTA chest without evidence of PE but shows ground glass interstitial opacity thought the left lung and right lower lobe per radiology report which may be decreased inspiration, less likely pulmonary edema or pneumonitis. Could be early ARDS, sequelae of his known granulomatous lung disease or just due to viral pathology. Patient currently on baseline oxygen requirement of 2 L with sats 96% Stable cardiomegaly - negative trops Plan: Hold fluid bolus per sepsis protocol secondary to CT result Trend  lactic      Kathlene Cote NP [facility]  Lactic acid continuing to trend up - 5.2 - further review of home meds includes metformin and patient had CT contrast - 20 ns bolus f/b ns 10 /h for 10 hours - monitor renal function and for signs of fluid overload

## 2022-04-24 ENCOUNTER — Encounter: Payer: Self-pay | Admitting: Internal Medicine

## 2022-04-24 ENCOUNTER — Other Ambulatory Visit: Payer: Self-pay

## 2022-04-24 DIAGNOSIS — A419 Sepsis, unspecified organism: Secondary | ICD-10-CM

## 2022-04-24 DIAGNOSIS — J441 Chronic obstructive pulmonary disease with (acute) exacerbation: Secondary | ICD-10-CM | POA: Diagnosis not present

## 2022-04-24 DIAGNOSIS — R0781 Pleurodynia: Secondary | ICD-10-CM

## 2022-04-24 DIAGNOSIS — J101 Influenza due to other identified influenza virus with other respiratory manifestations: Secondary | ICD-10-CM | POA: Diagnosis not present

## 2022-04-24 DIAGNOSIS — E872 Acidosis, unspecified: Secondary | ICD-10-CM | POA: Diagnosis present

## 2022-04-24 LAB — BASIC METABOLIC PANEL
Anion gap: 7 (ref 5–15)
BUN: 16 mg/dL (ref 8–23)
CO2: 18 mmol/L — ABNORMAL LOW (ref 22–32)
Calcium: 8.3 mg/dL — ABNORMAL LOW (ref 8.9–10.3)
Chloride: 109 mmol/L (ref 98–111)
Creatinine, Ser: 1.07 mg/dL (ref 0.61–1.24)
GFR, Estimated: >60 mL/min (ref >60)
Glucose, Bld: 237 mg/dL — ABNORMAL HIGH (ref 70–99)
Potassium: 4.4 mmol/L (ref 3.5–5.1)
Sodium: 134 mmol/L — ABNORMAL LOW (ref 135–145)

## 2022-04-24 LAB — LACTIC ACID, PLASMA
Lactic Acid, Venous: 3.5 mmol/L (ref 0.5–1.9)
Lactic Acid, Venous: 6.6 mmol/L (ref 0.5–1.9)
Lactic Acid, Venous: 6.5 mmol/L (ref 0.5–1.9)
Lactic Acid, Venous: 4.9 mmol/L (ref 0.5–1.9)

## 2022-04-24 LAB — CBC
HCT: 38 % — ABNORMAL LOW (ref 39.0–52.0)
Hemoglobin: 12.5 g/dL — ABNORMAL LOW (ref 13.0–17.0)
MCH: 31.2 pg (ref 26.0–34.0)
MCHC: 32.9 g/dL (ref 30.0–36.0)
MCV: 94.8 fL (ref 80.0–100.0)
Platelets: 207 10*3/uL (ref 150–400)
RBC: 4.01 MIL/uL — ABNORMAL LOW (ref 4.22–5.81)
RDW: 12.1 % (ref 11.5–15.5)
WBC: 4.8 10*3/uL (ref 4.0–10.5)
nRBC: 0 % (ref 0.0–0.2)

## 2022-04-24 LAB — GLUCOSE, CAPILLARY
Glucose-Capillary: 278 mg/dL — ABNORMAL HIGH (ref 70–99)
Glucose-Capillary: 250 mg/dL — ABNORMAL HIGH (ref 70–99)
Glucose-Capillary: 250 mg/dL — ABNORMAL HIGH (ref 70–99)

## 2022-04-24 LAB — CBG MONITORING, ED: Glucose-Capillary: 225 mg/dL — ABNORMAL HIGH (ref 70–99)

## 2022-04-24 MED ORDER — SODIUM CHLORIDE 0.9 % IV BOLUS: 1000.0000 mL | Freq: Once | INTRAVENOUS | Status: DC

## 2022-04-24 MED ORDER — SODIUM CHLORIDE 0.9 % IV BOLUS
1000.0000 mL | Freq: Once | INTRAVENOUS | Status: AC
  Administered 2022-04-24: 1000 mL via INTRAVENOUS

## 2022-04-24 MED ORDER — IPRATROPIUM-ALBUTEROL 0.5-2.5 (3) MG/3ML IN SOLN
3.0000 mL | Freq: Four times a day (QID) | RESPIRATORY_TRACT | Status: DC
  Administered 2022-04-24 – 2022-04-25 (×4): 3 mL via RESPIRATORY_TRACT
  Filled 2022-04-24 (×4): qty 3

## 2022-04-24 MED ORDER — LACTATED RINGERS IV SOLN: INTRAVENOUS | Status: AC

## 2022-04-24 NOTE — Assessment & Plan Note (Signed)
Respiratory panel positive for influenza B.  Resulted in COPD exacerbation. Oxygen requirement at baseline.  CTA negative for PE.  Procalcitonin negative -Continue with supportive care which include bronchodilators and steroid. -Continue with Tamiflu -Incentive spirometry and flutter valve  -Continue with supplemental oxygen will

## 2022-04-24 NOTE — Assessment & Plan Note (Signed)
Resolved.  CTA negative for PE.  Procalcitonin negative Did show some groundglass opacities

## 2022-04-24 NOTE — Assessment & Plan Note (Signed)
Patient met sepsis criteria with fever, tachycardia and tachypnea, secondary to influenza B infection.  Met severe sepsis criteria with lactic acidosis. -Management as above

## 2022-04-24 NOTE — Assessment & Plan Note (Addendum)
BMP with none anion gap metabolic acidosis.  Most likely secondary to above, although no significant hypoxia noted.  Persistent lactic acidosis.  Patient was also on metformin at home which can be contributory. -Giving some more IV fluid -Monitor lactic acid.

## 2022-04-24 NOTE — Assessment & Plan Note (Signed)
-  Continue Crestor 

## 2022-04-24 NOTE — Progress Notes (Signed)
Progress Note   Patient: Jonathan Willis KAJ:[id number] DOB: 1945/11/07 DOA: 04/23/2022     1 DOS: the patient was seen and examined on 04/24/2022   Brief hospital course: Taken from H&P.  Jonathan Willis is a 77 y.o. male with medical history significant of COPD on 2-3L of prn O2, hypertension, hyperlipidemia, diabetes mellitus, lower GI bleeding, BPH, carotid artery stenosis, tuberculosis in the past, granulomatous lung disease, who presents with shortness breath and chest pain for about 2 days, progressively worsening.   Patient speaks [place], history is obtained with his son's help who speaks good [place].    Data reviewed independently and ED Course: pt was found to have positive influenza B, WBC 6.9, troponin 4  --> 4, GFR> 60, temperature normal, blood pressure 138/70, heart rate 100-1 20, RR 21, 28, oxygen saturation 96% on room air. Chest x-ray with no active cardiopulmonary disease.  MBT:DHRCB rhythm, QTc 426, LAE, early R wave progression.  Admitted for COPD exacerbation secondary to influenza B.  1/28: Remained stable on 2 L of oxygen which is his baseline.  CTA negative for PE, did show interval minimal groundglass interstitial opacity throughout the left lung and right lung lobe.  Mild interstitial pulmonary edema or pneumonitis or less likely consolidation. Also noted to have stable extensive atheromatous changes in thoracic aorta with an appearance suggesting a partial short segment intimal flap in the distal arch without propagation.  A will lso has stable diffuse hepatic steatosis.  Continue to have significant fluctuating lactic acidosis. Giving some more IV fluid and we will continue to monitor  Assessment and Plan: * Influenza B Respiratory panel positive for influenza B.  Resulted in COPD exacerbation. Oxygen requirement at baseline.  CTA negative for PE.  Procalcitonin negative -Continue with supportive care which include bronchodilators and steroid. -Continue with  Tamiflu -Incentive spirometry and flutter valve  -Continue with supplemental oxygen will  Sepsis [facility]) Patient met sepsis criteria with fever, tachycardia and tachypnea, secondary to influenza B infection.  Met severe sepsis criteria with lactic acidosis. -Management as above  Lactic acidosis BMP with none anion gap metabolic acidosis.  Most likely secondary to above, although no significant hypoxia noted.  Persistent lactic acidosis.  Patient was also on metformin at home which can be contributory. -Giving some more IV fluid -Monitor lactic acid.   Pleuritic chest pain Resolved.  CTA negative for PE.  Procalcitonin negative Did show some groundglass opacities  Essential hypertension Not on any antihypertensive at home -IV hydralazine as needed  Diabetes mellitus without complication ([place]) Seems well-controlled with recent A1c of 6.9.  Patient was on metformin at home. -SSI  HLD (hyperlipidemia) -Continue Crestor  Granulomatous lung disease ([place]) Patient is scheduled for bronchoscopy on Monday but Dr. Lanney Gins. He is not on-call today.  We will try informing him tomorrow.    Subjective: Patient was seen and examined today.  Denies any new complaint.  Physical Exam: Vitals:   04/24/22 0600 04/24/22 0800 04/24/22 1023 04/24/22 1111  BP: (!) 118/57 (!) 113/55 134/60   Pulse: 96 94 (!) 110   Resp: (!) '22 14 20   '$ Temp:  98.2 F (36.8 C) 97.8 F (36.6 C)   TempSrc:  Oral Oral   SpO2: 99% 99% 98% 98%  Weight:      Height:       General. Frail elderly man,  In no acute distress. Pulmonary.  Lungs clear bilaterally, normal respiratory effort. CV.  Mild sinus tachycardia Abdomen.  Soft, nontender, nondistended, BS positive.  CNS.  Alert and oriented .  No focal neurologic deficit. Extremities.  No edema, no cyanosis, pulses intact and symmetrical.    Data Reviewed: Prior data reviewed  Family Communication: Discussed with son and daughter on phone, who can speak  [place].  Disposition: Status is: Inpatient Remains inpatient appropriate because: Severity of illness  Planned Discharge Destination: Home  DVT prophylaxis.  Lovenox Time spent: 50 minutes  This record has been created using Systems analyst. Errors have been sought and corrected,but may not always be located. Such creation errors do not reflect on the standard of care.   Author: Lorella Nimrod, MD 04/24/2022 1:30 PM  For on call review [email or web address].

## 2022-04-24 NOTE — Assessment & Plan Note (Signed)
Seems well-controlled with recent A1c of 6.9.  Patient was on metformin at home. -SSI

## 2022-04-24 NOTE — Assessment & Plan Note (Signed)
Not on any antihypertensive at home -IV hydralazine as needed

## 2022-04-24 NOTE — Assessment & Plan Note (Signed)
Patient is scheduled for bronchoscopy on Monday but Dr. Lanney Gins. He is not on-call today.  We will try informing him tomorrow.

## 2022-04-24 NOTE — ED Notes (Signed)
Advised nurse that patient has ready bed 

## 2022-04-24 NOTE — Hospital Course (Addendum)
Taken from H&P.  Jonathan Willis is a 77 y.o. male with medical history significant of COPD on 2-3L of prn O2, hypertension, hyperlipidemia, diabetes mellitus, lower GI bleeding, BPH, carotid artery stenosis, tuberculosis in the past, granulomatous lung disease, who presents with shortness breath and chest pain for about 2 days, progressively worsening.   Patient speaks [place], history is obtained with his son's help who speaks good [place].    Data reviewed independently and ED Course: pt was found to have positive influenza B, WBC 6.9, troponin 4  --> 4, GFR> 60, temperature normal, blood pressure 138/70, heart rate 100-1 20, RR 21, 28, oxygen saturation 96% on room air. Chest x-ray with no active cardiopulmonary disease.  ERD:EYCXK rhythm, QTc 426, LAE, early R wave progression.  Admitted for COPD exacerbation secondary to influenza B.  1/28: Remained stable on 2 L of oxygen which is his baseline.  CTA negative for PE, did show interval minimal groundglass interstitial opacity throughout the left lung and right lung lobe.  Mild interstitial pulmonary edema or pneumonitis or less likely consolidation. Also noted to have stable extensive atheromatous changes in thoracic aorta with an appearance suggesting a partial short segment intimal flap in the distal arch without propagation.  A will lso has stable diffuse hepatic steatosis.  Continue to have significant fluctuating lactic acidosis. Giving some more IV fluid and we will continue to monitor.  1/29: Patient remained stable and improving.  Mild hyperkalemia with potassium of 5.2 which improved to 4.4 after getting a dose of [place]elt[place].  No pain and eating well.  Would like to go home.   He is being discharged on 40 mg of prednisone for 4 more days for concern of COPD exacerbation secondary to influenza A, along with 4 more days of Tamiflu.  He was instructed to resume his home dose of prednisone of 2.5 mg daily after finishing these 4 days of high  doses of steroid.   Will continue with rest of his home medications and need to have a close follow-up with his providers for further recommendations.

## 2022-04-25 ENCOUNTER — Encounter: Payer: Self-pay | Admitting: Urgent Care

## 2022-04-25 ENCOUNTER — Inpatient Hospital Stay: Admission: RE | Admit: 2022-04-25 | Payer: Medicare Other | Source: Ambulatory Visit

## 2022-04-25 DIAGNOSIS — J9611 Chronic respiratory failure with hypoxia: Secondary | ICD-10-CM | POA: Diagnosis not present

## 2022-04-25 DIAGNOSIS — J101 Influenza due to other identified influenza virus with other respiratory manifestations: Secondary | ICD-10-CM | POA: Diagnosis not present

## 2022-04-25 DIAGNOSIS — J441 Chronic obstructive pulmonary disease with (acute) exacerbation: Secondary | ICD-10-CM | POA: Diagnosis not present

## 2022-04-25 DIAGNOSIS — E872 Acidosis, unspecified: Secondary | ICD-10-CM | POA: Diagnosis not present

## 2022-04-25 LAB — BASIC METABOLIC PANEL
Anion gap: 9 (ref 5–15)
BUN: 23 mg/dL (ref 8–23)
CO2: 23 mmol/L (ref 22–32)
Calcium: 9.2 mg/dL (ref 8.9–10.3)
Chloride: 107 mmol/L (ref 98–111)
Creatinine, Ser: 1.01 mg/dL (ref 0.61–1.24)
GFR, Estimated: >60 mL/min (ref >60)
Glucose, Bld: 265 mg/dL — ABNORMAL HIGH (ref 70–99)
Potassium: 5.2 mmol/L — ABNORMAL HIGH (ref 3.5–5.1)
Sodium: 139 mmol/L (ref 135–145)

## 2022-04-25 LAB — CBC
HCT: 37.9 % — ABNORMAL LOW (ref 39.0–52.0)
Hemoglobin: 12.3 g/dL — ABNORMAL LOW (ref 13.0–17.0)
MCH: 30.8 pg (ref 26.0–34.0)
MCHC: 32.5 g/dL (ref 30.0–36.0)
MCV: 95 fL (ref 80.0–100.0)
Platelets: 224 10*3/uL (ref 150–400)
RBC: 3.99 MIL/uL — ABNORMAL LOW (ref 4.22–5.81)
RDW: 12.3 % (ref 11.5–15.5)
WBC: 8.9 10*3/uL (ref 4.0–10.5)
nRBC: 0 % (ref 0.0–0.2)

## 2022-04-25 LAB — GLUCOSE, CAPILLARY
Glucose-Capillary: 177 mg/dL — ABNORMAL HIGH (ref 70–99)
Glucose-Capillary: 331 mg/dL — ABNORMAL HIGH (ref 70–99)

## 2022-04-25 LAB — POTASSIUM: Potassium: 4.4 mmol/L (ref 3.5–5.1)

## 2022-04-25 MED ORDER — PREDNISONE 2.5 MG PO TABS: 2.5000 mg | ORAL_TABLET | Freq: Every day | ORAL | Status: DC

## 2022-04-25 MED ORDER — PATIROMER SORBITEX CALCIUM 8.4 G PO PACK
8.4000 g | PACK | Freq: Every day | ORAL | Status: DC
  Administered 2022-04-25: 8.4 g via ORAL

## 2022-04-25 MED ORDER — PATIROMER SORBITEX CALCIUM 8.4 G PO PACK
8.4000 g | PACK | Freq: Every day | ORAL | Status: DC
  Filled 2022-04-25: qty 1

## 2022-04-25 MED ORDER — INSULIN ASPART 100 UNIT/ML IJ SOLN: 10.0000 [IU] | Freq: Once | INTRAMUSCULAR | Status: DC

## 2022-04-25 MED ORDER — OSELTAMIVIR PHOSPHATE 30 MG PO CAPS: 30.0000 mg | ORAL_CAPSULE | Freq: Two times a day (BID) | ORAL | 0 refills | Status: DC

## 2022-04-25 MED ORDER — GUAIFENESIN-DM 100-10 MG/5ML PO SYRP: 5.0000 mL | ORAL_SOLUTION | ORAL | 0 refills | Status: DC | PRN

## 2022-04-25 MED ORDER — PREDNISONE 20 MG PO TABS: 40.0000 mg | ORAL_TABLET | Freq: Every day | ORAL | 0 refills | Status: AC

## 2022-04-25 MED ORDER — SIMETHICONE 80 MG PO CHEW
80.0000 mg | CHEWABLE_TABLET | Freq: Once | ORAL | Status: AC
  Administered 2022-04-25: 80 mg via ORAL
  Filled 2022-04-25: qty 1

## 2022-04-25 NOTE — Discharge Summary (Signed)
Physician Discharge Summary   Patient: Jonathan Willis MRN: [id number] DOB: 08-Sep-1945  Admit date:     04/23/2022  Discharge date: 04/25/22  Discharge Physician: Lorella Nimrod   PCP: Jearld Fenton, NP   Recommendations at discharge:  Please obtain CBC and BMP in 1 week Follow-up with primary care provider Follow-up with pulmonology  Discharge Diagnoses: Principal Problem:   Influenza B Active Problems:   Sepsis ([place])   COPD exacerbation ([place])   Lactic acidosis   Pleuritic chest pain   Essential hypertension   Diabetes mellitus without complication ([place])   HLD (hyperlipidemia)   Granulomatous lung disease [facility])   Hospital Course: Taken from H&P.  Jonathan Willis is a 77 y.o. male with medical history significant of COPD on 2-3L of prn O2, hypertension, hyperlipidemia, diabetes mellitus, lower GI bleeding, BPH, carotid artery stenosis, tuberculosis in the past, granulomatous lung disease, who presents with shortness breath and chest pain for about 2 days, progressively worsening.   Patient speaks [place], history is obtained with his son's help who speaks good [place].    Data reviewed independently and ED Course: pt was found to have positive influenza B, WBC 6.9, troponin 4  --> 4, GFR> 60, temperature normal, blood pressure 138/70, heart rate 100-1 20, RR 21, 28, oxygen saturation 96% on room air. Chest x-ray with no active cardiopulmonary disease.  Jonathan Willis, QTc 426, LAE, early R wave progression.  Admitted for COPD exacerbation secondary to influenza B.  1/28: Remained stable on 2 L of oxygen which is his baseline.  CTA negative for PE, did show interval minimal groundglass interstitial opacity throughout the left lung and right lung lobe.  Mild interstitial pulmonary edema or pneumonitis or less likely consolidation. Also noted to have stable extensive atheromatous changes in thoracic aorta with an appearance suggesting a partial short segment intimal flap in the distal  arch without propagation.  A will lso has stable diffuse hepatic steatosis.  Continue to have significant fluctuating lactic acidosis. Giving some more IV fluid and we will continue to monitor.  1/29: Patient remained stable and improving.  Mild hyperkalemia with potassium of 5.2 which improved to 4.4 after getting a dose of Veltassa.  No pain and eating well.  Would like to go home.   He is being discharged on 40 mg of prednisone for 4 more days for concern of COPD exacerbation secondary to influenza A, along with 4 more days of Tamiflu.  He was instructed to resume his home dose of prednisone of 2.5 mg daily after finishing these 4 days of high doses of steroid.   Will continue with rest of his home medications and need to have a close follow-up with his providers for further recommendations.  Assessment and Plan: * Influenza B Respiratory panel positive for influenza B.  Resulted in COPD exacerbation. Oxygen requirement at baseline.  CTA negative for PE.  Procalcitonin negative -Continue with supportive care which include bronchodilators and steroid. -Continue with Tamiflu -Incentive spirometry and flutter valve  -Continue with supplemental oxygen will  Sepsis [facility]) Patient met sepsis criteria with fever, tachycardia and tachypnea, secondary to influenza B infection.  Met severe sepsis criteria with lactic acidosis. -Management as above  Lactic acidosis BMP with none anion gap metabolic acidosis.  Most likely secondary to above, although no significant hypoxia noted.  Persistent lactic acidosis.  Patient was also on metformin at home which can be contributory. -Giving some more IV fluid -Monitor lactic acid.   Pleuritic chest pain Resolved.  CTA negative for PE.  Procalcitonin negative Did show some groundglass opacities  Essential hypertension Not on any antihypertensive at home -IV hydralazine as needed  Diabetes mellitus without complication (Jonathan Willis) Seems well-controlled  with recent A1c of 6.9.  Patient was on metformin at home. -SSI  HLD (hyperlipidemia) -Continue Crestor  Granulomatous lung disease (Jonathan Willis) Patient is scheduled for bronchoscopy on Monday but Dr. Lanney Jonathan Willis. He is not on-call today.  We will try informing him tomorrow.   Consultants: None Procedures performed: None Disposition: Home Diet recommendation:  Discharge Diet Orders (From admission, onward)     Start     Ordered   04/25/22 0000  Diet - low sodium heart healthy        04/25/22 1519           Cardiac and Carb modified diet DISCHARGE MEDICATION: Allergies as of 04/25/2022   No Known Allergies      Medication List     STOP taking these medications    predniSONE 10 MG (21) Tbpk tablet Commonly known as: STERAPRED UNI-PAK 21 TAB Replaced by: predniSONE 20 MG tablet You also have another medication with the same name that you need to continue taking as instructed.       TAKE these medications    Accu-Chek Aviva Connect w/Device Kit 1 Device by Does not apply route daily. Dx E11.4   Accu-Chek Softclix Lancets lancets USE TO TEST ONCE DAILY   albuterol 108 (90 Base) MCG/ACT inhaler Commonly known as: VENTOLIN HFA Inhale 2 puffs into the lungs every 6 (six) hours as needed for wheezing or shortness of breath.   Bactrim 400-80 MG tablet Generic drug: sulfamethoxazole-trimethoprim Take 1 tablet by mouth 3 (three) times a week.   guaiFENesin-dextromethorphan 100-10 MG/5ML syrup Commonly known as: ROBITUSSIN DM Take 5 mLs by mouth every 4 (four) hours as needed for cough.   ipratropium-albuterol 0.5-2.5 (3) MG/3ML Soln Commonly known as: DUONEB Take 3 mLs by nebulization.   meclizine 25 MG tablet Commonly known as: ANTIVERT Take 1 tablet (25 mg total) by mouth 3 (three) times daily as needed for dizziness.   metFORMIN 500 MG tablet Commonly known as: GLUCOPHAGE Take 1 tablet (500 mg total) by mouth 2 (two) times daily with a meal.   oseltamivir 30  MG capsule Commonly known as: TAMIFLU Take 1 capsule (30 mg total) by mouth 2 (two) times daily for 4 days.   OXYGEN Inhale into the lungs. 3 Liters   predniSONE 20 MG tablet Commonly known as: DELTASONE Take 2 tablets (40 mg total) by mouth daily with breakfast for 4 days. What changed: You were already taking a medication with the same name, and this prescription was added. Make sure you understand how and when to take each. Replaces: predniSONE 10 MG (21) Tbpk tablet   predniSONE 2.5 MG tablet Commonly known as: DELTASONE Take 1 tablet (2.5 mg total) by mouth daily with breakfast. Resume after finishing 40 mg for 4 days What changed:  additional instructions Another medication with the same name was removed. Continue taking this medication, and follow the directions you see here.   rosuvastatin 10 MG tablet Commonly known as: Crestor Take 1 tablet (10 mg total) by mouth daily.   Trelegy Ellipta 100-62.5-25 MCG/ACT Aepb Generic drug: Fluticasone-Umeclidin-Vilant Inhale 1 puff into the lungs daily.        Follow-up Information     Jearld Fenton, NP. Schedule an appointment as soon as possible for a visit in 1 week(s).  Specialties: Internal Medicine, Emergency Medicine Contact information: [place] [address] [phone]         Jearld Fenton, NP. Schedule an appointment as soon as possible for a visit in 1 week(s).   Specialties: Internal Medicine, Emergency Medicine Contact information: [place] [address] [phone]                Discharge Exam: Filed Weights   04/23/22 1602  Weight: 49.9 kg   General.     In no acute distress. Pulmonary.  Scant scattered inspiratory wheeze bilaterally, normal respiratory effort. CV.  Regular rate and Willis, no JVD, rub or murmur. Abdomen.  Soft, nontender, nondistended, BS positive. CNS.  Alert and oriented .  No focal neurologic deficit. Extremities.  No edema, no cyanosis,  pulses intact and symmetrical. Psychiatry.  Judgment and insight appears normal.   Condition at discharge: stable  The results of significant diagnostics from this hospitalization (including imaging, microbiology, ancillary and laboratory) are listed below for reference.   Imaging Studies: CT Angio Chest Pulmonary Embolism (PE) W or WO Contrast  Result Date: 04/23/2022 CLINICAL DATA:  Chest pain and shortness of breath. EXAM: CT ANGIOGRAPHY CHEST WITH CONTRAST TECHNIQUE: Multidetector CT imaging of the chest was performed using the standard protocol during bolus administration of intravenous contrast. Multiplanar CT image reconstructions and MIPs were obtained to evaluate the vascular anatomy. RADIATION DOSE REDUCTION: This exam was performed according to the departmental dose-optimization program which includes automated exposure control, adjustment of the mA and/or kV according to patient size and/or use of iterative reconstruction technique. CONTRAST:  31m OMNIPAQUE IOHEXOL 350 MG/ML SOLN COMPARISON:  04/19/2021 FINDINGS: Cardiovascular: Stable enlarged heart. Stable extensive atheromatous changes in the thoracic aorta with an appearance suggesting a partial short-segment intimal flap in the distal arch. No interval aortic dissection or aneurysm. Normally opacified pulmonary arteries with no pulmonary arterial filling defects seen. Atheromatous calcifications, including the coronary arteries and aorta. Mediastinum/Nodes: No enlarged mediastinal, hilar, or axillary lymph nodes. Thyroid gland, trachea, and esophagus demonstrate no significant findings. Lungs/Pleura: Stable right upper lobe calcified granuloma and partially calcified chronic scarring/atelectasis. Stable linear scarring in the right lower lobe. Interval minimal ground-glass interstitial opacity throughout the left lung right lower lobe. Decreased depth of inspiration compared to the previous examination suggesting that this due to crowding  of the lung markings. No pleural fluid. Upper Abdomen: Diffuse low density of the liver. Musculoskeletal: Mild thoracic and lower cervical spine degenerative changes. Review of the MIP images confirms the above findings. IMPRESSION: 1. No pulmonary emboli. 2. Interval minimal ground-glass interstitial opacity throughout the left lung and right lower lobe. This is most likely due to a decreased inspiration on the current study. Mild interstitial pulmonary edema or pneumonitis are less likely considerations. 3. Stable cardiomegaly. 4. Stable extensive atheromatous changes in the thoracic aorta with an appearance suggesting a partial short-segment intimal flap in the distal arch without propagation. 5. Stable diffuse hepatic steatosis. 6. Calcific coronary artery and aortic atherosclerosis. Aortic Atherosclerosis (ICD10-I70.0). Electronically Signed   By: SClaudie ReveringM.D.   On: 04/23/2022 19:20   DG Chest 2 View  Result Date: 04/23/2022 CLINICAL DATA:  Chronic respiratory failure. Worsening shortness of breath. EXAM: CHEST - 2 VIEW COMPARISON:  Chest x-ray March 30, 2022 FINDINGS: There is a calcified nodule in the right upper to mid lung. Chronic volume loss on the right and post surgical changes on the right. The left lung is clear. No  pneumothorax. No suspicious nodules or masses. The cardiomediastinal silhouette is stable. IMPRESSION: 1. No active cardiopulmonary disease. 2. Calcified nodule in the right upper to mid lung is stable. Electronically Signed   By: Dorise Bullion III M.D.   On: 04/23/2022 17:06   DG Chest 2 View  Result Date: 03/30/2022 CLINICAL DATA:  Shortness of breath EXAM: CHEST - 2 VIEW COMPARISON:  05/13/2021 FINDINGS: Band like and nodular opacity with volume loss at the right apex, chronic. There is no edema, consolidation, effusion, or pneumothorax. Normal heart size and mediastinal contours. Chronic upper lumbar wedging. IMPRESSION: Stable opacity and volume loss at the right apex.  No acute finding when compared to priors. Electronically Signed   By: Jorje Guild M.D.   On: 03/30/2022 07:17    Microbiology: Results for orders placed or performed during the hospital encounter of 04/23/22  Resp panel by RT-PCR (RSV, Flu A&B, Covid) Anterior Nasal Swab     Status: Abnormal   Collection Time: 04/23/22  4:22 PM   Specimen: Anterior Nasal Swab  Result Value Ref Range Status   SARS Coronavirus 2 by RT PCR NEGATIVE NEGATIVE Final    Comment: (NOTE) SARS-CoV-2 target nucleic acids are NOT DETECTED.  The SARS-CoV-2 RNA is generally detectable in upper respiratory specimens during the acute phase of infection. The lowest concentration of SARS-CoV-2 viral copies this assay can detect is 138 copies/mL. A negative result does not preclude SARS-Cov-2 infection and should not be used as the sole basis for treatment or other patient management decisions. A negative result may occur with  improper specimen collection/handling, submission of specimen other than nasopharyngeal swab, presence of viral mutation(s) within the areas targeted by this assay, and inadequate number of viral copies(<138 copies/mL). A negative result must be combined with clinical observations, patient history, and epidemiological information. The expected result is Negative.  Fact Sheet for Patients:  EntrepreneurPulse.com.au  Fact Sheet for Healthcare Providers:  IncredibleEmployment.be  This test is no t yet approved or cleared by the [place] FDA and  has been authorized for detection and/or diagnosis of SARS-CoV-2 by FDA under an Emergency Use Authorization (EUA). This EUA will remain  in effect (meaning this test can be used) for the duration of the COVID-19 declaration under Section 564(b)(1) of the Act, 21 U.S.C.section 360bbb-3(b)(1), unless the authorization is terminated  or revoked sooner.       Influenza A by PCR NEGATIVE NEGATIVE Final    Influenza B by PCR POSITIVE (A) NEGATIVE Final    Comment: (NOTE) The Xpert Xpress SARS-CoV-2/FLU/RSV plus assay is intended as an aid in the diagnosis of influenza from Nasopharyngeal swab specimens and should not be used as a sole basis for treatment. Nasal washings and aspirates are unacceptable for Xpert Xpress SARS-CoV-2/FLU/RSV testing.  Fact Sheet for Patients: EntrepreneurPulse.com.au  Fact Sheet for Healthcare Providers: IncredibleEmployment.be  This test is not yet approved or cleared by the [place] FDA and has been authorized for detection and/or diagnosis of SARS-CoV-2 by FDA under an Emergency Use Authorization (EUA). This EUA will remain in effect (meaning this test can be used) for the duration of the COVID-19 declaration under Section 564(b)(1) of the Act, 21 U.S.C. section 360bbb-3(b)(1), unless the authorization is terminated or revoked.     Resp Syncytial Virus by PCR NEGATIVE NEGATIVE Final    Comment: (NOTE) Fact Sheet for Patients: EntrepreneurPulse.com.au  Fact Sheet for Healthcare Providers: IncredibleEmployment.be  This test is not yet approved or cleared by the [place] FDA  and has been authorized for detection and/or diagnosis of SARS-CoV-2 by FDA under an Emergency Use Authorization (EUA). This EUA will remain in effect (meaning this test can be used) for the duration of the COVID-19 declaration under Section 564(b)(1) of the Act, 21 U.S.C. section 360bbb-3(b)(1), unless the authorization is terminated or revoked.  Performed at [facility], [address] [place], [place] [address]     Labs: CBC: Recent Labs  Lab 04/23/22 1603 04/24/22 0451 04/25/22 0200  WBC 6.9 4.8 8.9  HGB 14.7 12.5* 12.3*  HCT 43.7 38.0* 37.9*  MCV 92.6 94.8 95.0  PLT 230 207 875   Basic Metabolic Panel: Recent Labs  Lab 04/23/22 1603 04/24/22 0451 04/25/22 0200  04/25/22 1419  NA 131* 134* 139  --   K 3.9 4.4 5.2* 4.4  CL 99 109 107  --   CO2 22 18* 23  --   GLUCOSE 120* 237* 265*  --   BUN '10 16 23  '$ --   CREATININE 0.96 1.07 1.01  --   CALCIUM 8.5* 8.3* 9.2  --    Liver Function Tests: No results for input(s): "AST", "ALT", "ALKPHOS", "BILITOT", "PROT", "ALBUMIN" in the last 168 hours. CBG: Recent Labs  Lab 04/24/22 1128 04/24/22 1629 04/24/22 2204 04/25/22 0813 04/25/22 1159  GLUCAP 278* 250* 250* 177* 331*    Discharge time spent: greater than 30 minutes.  This record has been created using Systems analyst. Errors have been sought and corrected,but may not always be located. Such creation errors do not reflect on the standard of care.   Signed: Lorella Nimrod, MD [facility] 04/25/2022

## 2022-04-25 NOTE — Inpatient Diabetes Management (Signed)
Inpatient Diabetes Program Recommendations  AACE/ADA: New Consensus Statement on Inpatient Glycemic Control   Target Ranges:  Prepandial:   less than 140 mg/dL      Peak postprandial:   less than 180 mg/dL (1-2 hours)      Critically ill patients:  140 - 180 mg/dL    Latest Reference Range & Units 04/24/22 07:36 04/24/22 11:28 04/24/22 16:29 04/24/22 22:04 04/25/22 08:13 04/25/22 11:59  Glucose-Capillary 70 - 99 mg/dL 225 (H) 278 (H) 250 (H) 250 (H) 177 (H) 331 (H)   Review of Glycemic Control  Diabetes history: DM2 Outpatient Diabetes medications: Metformin 500 mg BID Current orders for Inpatient glycemic control: Novolog 0-9 units TID with meals, Novolog 0-5 units QHS; Solumedrol 40 mg Q12H  Inpatient Diabetes Program Recommendations:    Insulin: If steroids are continued, please consider ordering Novolog 3 units TID with meals for meal coverage if patient eats at least 50% of meals.  Thanks, Barnie Alderman, RN, MSN, [place] [facility] Coordinator Inpatient Diabetes Program [phone] (Team Pager from 8am to [place])

## 2022-04-25 NOTE — Plan of Care (Signed)
  Problem: Activity: Goal: Ability to tolerate increased activity will improve 04/25/2022 1303 by Lloyd Huger, LPN Outcome: Progressing 04/25/2022 1303 by Lloyd Huger, LPN Outcome: Progressing Goal: Will verbalize the importance of balancing activity with adequate rest periods 04/25/2022 1303 by Lloyd Huger, LPN Outcome: Progressing 04/25/2022 1303 by Lloyd Huger, LPN Outcome: Progressing   Problem: Respiratory: Goal: Ability to maintain a clear airway will improve 04/25/2022 1303 by Lloyd Huger, LPN Outcome: Progressing 04/25/2022 1303 by Lloyd Huger, LPN Outcome: Progressing Goal: Levels of oxygenation will improve 04/25/2022 1303 by Lloyd Huger, LPN Outcome: Progressing 04/25/2022 1303 by Lloyd Huger, LPN Outcome: Progressing Goal: Ability to maintain adequate ventilation will improve 04/25/2022 1303 by Lloyd Huger, LPN Outcome: Progressing 04/25/2022 1303 by Lloyd Huger, LPN Outcome: Progressing   Problem: Education: Goal: Ability to describe self-care measures that may prevent or decrease complications (Diabetes Survival Skills Education) will improve 04/25/2022 1303 by Lloyd Huger, LPN Outcome: Progressing 04/25/2022 1303 by Lloyd Huger, LPN Outcome: Progressing Goal: Individualized Educational Video(s) 04/25/2022 1303 by Lloyd Huger, LPN Outcome: Progressing 04/25/2022 1303 by Lloyd Huger, LPN Outcome: Progressing   Problem: Coping: Goal: Ability to adjust to condition or change in health will improve 04/25/2022 1303 by Lloyd Huger, LPN Outcome: Progressing 04/25/2022 1303 by Lloyd Huger, LPN Outcome: Progressing   Problem: Fluid Volume: Goal: Ability to maintain a balanced intake and output will improve 04/25/2022 1303 by Lloyd Huger, LPN Outcome: Progressing 04/25/2022 1303 by Lloyd Huger, LPN Outcome: Progressing   Problem: Metabolic: Goal: Ability to maintain appropriate glucose levels will  improve 04/25/2022 1303 by Lloyd Huger, LPN Outcome: Progressing 04/25/2022 1303 by Lloyd Huger, LPN Outcome: Progressing

## 2022-04-25 NOTE — Progress Notes (Signed)
Discussed d/c packet with pt via interpreter service, pt language- [place], discussed all medications with pt, pt has 3 medication to pick up form Rx of his choice. Pt PIV removed site c/d/I, Pt called son and escorted down to [facility], d/c with all belongings

## 2022-04-26 ENCOUNTER — Telehealth: Payer: Self-pay

## 2022-04-26 ENCOUNTER — Ambulatory Visit: Payer: Medicare Other | Admitting: Internal Medicine

## 2022-04-26 ENCOUNTER — Ambulatory Visit (INDEPENDENT_AMBULATORY_CARE_PROVIDER_SITE_OTHER): Payer: Medicare Other | Admitting: Internal Medicine

## 2022-04-26 ENCOUNTER — Encounter: Payer: Self-pay | Admitting: Internal Medicine

## 2022-04-26 VITALS — BP 136/80 | HR 88 | Temp 96.8°F | Wt 113.0 lb

## 2022-04-26 DIAGNOSIS — A419 Sepsis, unspecified organism: Secondary | ICD-10-CM | POA: Diagnosis not present

## 2022-04-26 DIAGNOSIS — J101 Influenza due to other identified influenza virus with other respiratory manifestations: Secondary | ICD-10-CM

## 2022-04-26 DIAGNOSIS — J441 Chronic obstructive pulmonary disease with (acute) exacerbation: Secondary | ICD-10-CM | POA: Diagnosis not present

## 2022-04-26 MED ORDER — PROMETHAZINE-DM 6.25-15 MG/5ML PO SYRP: 5.0000 mL | ORAL_SOLUTION | Freq: Four times a day (QID) | ORAL | 0 refills | Status: DC | PRN

## 2022-04-26 NOTE — Telephone Encounter (Signed)
Pt had fu appt 04-26-22 at 830am with PCP

## 2022-04-26 NOTE — Progress Notes (Signed)
Subjective:    Patient ID: Jonathan Willis, male    DOB: 1945-04-28, 77 y.o.   MRN: [id number]  HPI  Patient presents to clinic today for hospital follow-up.  He presented to the ER 1/27 with complaint of cough, shortness of breath and chest pain.  He tested positive for flu B.  His labs look good although his lactic acid was elevated.  ECG did not show any acute findings.  He was admitted for COPD exacerbation secondary to flu B.  CTA of the chest was negative.  He was treated with IV fluids steroids and Tamiflu.  He was discharged with a prescription for prednisone and Tamiflu.  He is on oxygen at home.  He was discharged 1/29.  Since that time, he reports headache, runny nose, ear pain, sore throat, cough, shortness of breath and chest tightness.  The cough is productive of white mucus.  He denies nausea, vomiting, diarrhea, fever, chills or body aches.  He has been taking the medication as prescribed.  He is not wearing his oxygen today to this appointment.  He has a follow-up with pulmonology tomorrow for bronchoscopy.  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 8 g 6   Blood Glucose Monitoring Suppl (ACCU-CHEK AVIVA CONNECT) w/Device KIT 1 Device by Does not apply route daily. Dx E11.4 1 kit 0   Fluticasone-Umeclidin-Vilant (TRELEGY  ELLIPTA) 100-62.5-25 MCG/INH AEPB Inhale 1 puff into the lungs daily. 30 each 5   guaiFENesin-dextromethorphan (ROBITUSSIN DM) 100-10 MG/5ML syrup Take 5 mLs by mouth every 4 (four) hours as needed for cough. 118 mL 0   ipratropium-albuterol (DUONEB) 0.5-2.5 (3) MG/3ML SOLN Take 3 mLs by nebulization.     meclizine (ANTIVERT) 25 MG tablet Take 1 tablet (25 mg total) by mouth 3 (three) times daily as needed for dizziness. 30 tablet 0   metFORMIN (GLUCOPHAGE) 500 MG tablet Take 1 tablet (500 mg total) by mouth 2 (two) times daily with a meal. 180 tablet 0   OXYGEN Inhale into the lungs. 3 Liters     predniSONE (DELTASONE) 2.5 MG tablet Take 1 tablet (2.5 mg total) by mouth daily with breakfast. Resume after finishing 40 mg for 4 days     predniSONE (DELTASONE) 20 MG tablet Take 2 tablets (40 mg total) by mouth daily with breakfast for 4 days. 8 tablet 0   rosuvastatin (CRESTOR) 10 MG tablet Take 1 tablet (10 mg total) by mouth daily. 90 tablet 0   sulfamethoxazole-trimethoprim (BACTRIM) 400-80 MG tablet Take 1 tablet by mouth 3 (three) times a week.     oseltamivir (TAMIFLU) 30 MG capsule Take 1 capsule (30 mg total) by mouth 2 (two) times daily for 4 days. (Patient not taking: Reported on 04/26/2022) 8 capsule  0   No current facility-administered medications for this visit.    No Known Allergies  Family History  Problem Relation Age of Onset   COPD Neg Hx    Diabetes Mellitus II Neg Hx    Hypertension Neg Hx     Social History   Socioeconomic History   Marital status: Divorced    Spouse name: Not on file   Number of children: Not on file   Years of education: 9   Highest education level: 9th grade  Occupational History   Occupation: retired  Tobacco Use   Smoking status: Former    Packs/day: 1.50    Years: 44.00    Total pack years: 66.00    Types: Cigarettes    Quit date: 01/11/2007    Years since quitting: 15.2   Smokeless tobacco: Former  Scientific laboratory technician Use: Never  used  Substance and Sexual Activity   Alcohol use: Yes    Comment: very rarely   Drug use: Never   Sexual activity: Yes    Birth control/protection: None  Other Topics Concern   Not on file  Social History Narrative   Not on file   Social Determinants of Health   Financial Resource Strain: High Risk (03/04/2022)   Overall Financial Resource Strain (CARDIA)    Difficulty of Paying Living Expenses: Very hard  Food Insecurity: No Food Insecurity (04/24/2022)   Hunger Vital Sign    Worried About Running Out of Food in the Last Year: Never true    [place] in the Last Year: Never true  Transportation Needs: No Transportation Needs (04/24/2022)   PRAPARE - Hydrologist (Medical): No    Lack of Transportation (Non-Medical): No  Physical Activity: Insufficiently Active (03/04/2022)   Exercise Vital Sign    Days of Exercise per Week: 4 days    Minutes of Exercise per Session: 30 min  Stress: No Stress Concern Present (03/04/2022)   [place]    Feeling of Stress : Not at all  Social Connections: Moderately Isolated (03/04/2022)   Social Connection and Isolation Panel [NHANES]    Frequency of Communication with Friends and Family: More than three times a week    Frequency of Social Gatherings with Friends and Family: More than three times a week    Attends Religious Services: 1 to 4 times per year    Active Member of [employer] or Organizations: No    Attends Archivist Meetings: Never    Marital Status: Divorced  Human resources officer Violence: Not At Risk (04/24/2022)   Humiliation, Afraid, Rape, and Kick questionnaire    Fear of Current or Ex-Partner: No    Emotionally Abused: No    Physically Abused: No    Sexually Abused: No     Constitutional: Patient reports headache, fatigue and malaise.  Denies fever, or abrupt weight changes.  HEENT: Patient reports runny nose, ear pain and  sore throat.  Denies eye pain, eye redness, ringing in the ears, wax buildup, nasal congestion, bloody nose. Respiratory: Patient reports cough and shortness of breath.  Denies difficulty breathing, shortness of breath.   Cardiovascular: Patient reports chest tightness.  Denies chest pain, palpitations or swelling in the hands or feet.  Gastrointestinal: Denies abdominal pain, bloating, constipation, diarrhea or blood in the stool.  GU: Denies urgency, frequency, pain with urination, burning sensation, blood in urine, odor or discharge. Musculoskeletal: Patient reports  generalized weakness.  Denies decrease in range of motion, difficulty with gait, muscle pain or joint pain and swelling.  Skin: Denies redness, rashes, lesions or ulcercations.  Neurological: Denies dizziness, difficulty with memory, difficulty with speech or problems with balance and coordination.  Psych: Denies anxiety, depression, SI/HI.  No other specific complaints in a complete review of systems (except as listed in HPI above).  Objective:   Physical Exam   BP 136/80 (BP Location: Right Arm, Patient Position: Sitting, Cuff Size: Normal)   Pulse 88   Temp (!) 96.8 F (36 C) (Temporal)   Wt 113 lb (51.3 kg)   SpO2 99%   BMI 21.35 kg/m  Wt Readings from Last 3 Encounters:  04/26/22 113 lb (51.3 kg)  04/23/22 110 lb 0.2 oz (49.9 kg)  04/18/22 110 lb (49.9 kg)    General: Appears his stated age, appears unwell but in NAD. Skin: Warm, dry and intact. No rashes noted. HEENT: Head: normal shape and size, maxillary sinus tenderness noted; Eyes: sclera white, no icterus, conjunctiva pink, PERRLA and EOMs intact; Ears: Tm's gray and intact, normal light reflex; Nose: mucosa pink and moist, septum midline; Throat/Mouth: Teeth present, mucosa erythematous and moist, no exudate, lesions or ulcerations noted.  Neck: No adenopathy noted. Cardiovascular: Normal rate and rhythm. S1,S2 noted.  No murmur, rubs or gallops noted.   Pulmonary/Chest: Normal effort and coarse breath sounds. No respiratory distress. No wheezes, rales or ronchi noted.  Abdomen: Soft and nontender. Normal bowel sounds.  Musculoskeletal: No difficulty with gait.  Neurological: Alert and oriented. . Coordination normal.    BMET    Component Value Date/Time   NA 139 04/25/2022 0200   NA 136 02/04/2014 0616   K 4.4 04/25/2022 1419   K 4.1 02/04/2014 0616   CL 107 04/25/2022 0200   CL 102 02/04/2014 0616   CO2 23 04/25/2022 0200   CO2 25 02/04/2014 0616   GLUCOSE 265 (H) 04/25/2022 0200   GLUCOSE 225 (H) 02/04/2014 0616   BUN 23 04/25/2022 0200   BUN 18 02/04/2014 0616   CREATININE 1.01 04/25/2022 0200   CREATININE 0.95 02/22/2022 1353   CALCIUM 9.2 04/25/2022 0200   CALCIUM 9.1 02/04/2014 0616   GFRNONAA >60 04/25/2022 0200   GFRNONAA 73 10/16/2018 0928   GFRAA >60 06/06/2019 1049   GFRAA 85 10/16/2018 0928    Lipid Panel     Component Value Date/Time   CHOL 276 (H) 02/22/2022 1353   TRIG 340 (H) 02/22/2022 1353   HDL 48 02/22/2022 1353   CHOLHDL 5.8 (H) 02/22/2022 1353   LDLCALC 170 (H) 02/22/2022 1353    CBC    Component Value Date/Time   WBC 8.9 04/25/2022 0200   RBC 3.99 (L) 04/25/2022 0200   HGB 12.3 (L) 04/25/2022 0200   HGB 14.1 02/06/2014 0345   HCT 37.9 (L) 04/25/2022 0200   HCT 42.3 02/06/2014 0345   PLT 224 04/25/2022 0200   PLT 336 02/06/2014 0345   MCV 95.0 04/25/2022 0200   MCV 95 02/06/2014 0345   MCH 30.8 04/25/2022 0200   MCHC 32.5 04/25/2022 0200   RDW 12.3 04/25/2022 0200   RDW 13.0 02/06/2014 0345   LYMPHSABS 2.8 03/30/2022 0710   LYMPHSABS 1.7 02/06/2014 0345   MONOABS 0.8 03/30/2022 0710   MONOABS 0.5 02/06/2014 0345   EOSABS 0.2 03/30/2022 0710   EOSABS 0.0 02/06/2014 0345   BASOSABS 0.1 03/30/2022 0710   BASOSABS 0.0 02/06/2014 0345    Hgb A1C Lab  Results  Component Value Date   HGBA1C 6.9 (H) 02/22/2022           Assessment & Plan:   Hospital Follow-up for Sepsis,  COPD Exacerbation secondary to Influenza B:   Hospital notes, labs and imaging reviewed Continue prednisone and Tamiflu until course is completed Continue oxygen as needed He will need repeat CBC and be met in 1 week He will follow-up with pulmonology tomorrow Rx for Promethazine DM cough syrup  RTC in 4 months for follow-up of chronic conditions

## 2022-04-26 NOTE — Patient Instructions (Signed)

## 2022-04-27 ENCOUNTER — Ambulatory Visit
Admission: RE | Admit: 2022-04-27 | Discharge: 2022-04-27 | Disposition: A | Discharge status: Home / Self Care | Payer: Medicare Other | Attending: Pulmonary Disease | Admitting: Pulmonary Disease

## 2022-04-27 ENCOUNTER — Ambulatory Visit: Payer: Medicare Other | Admitting: Anesthesiology

## 2022-04-27 ENCOUNTER — Encounter
Admission: RE | Disposition: A | Discharge status: Home / Self Care | Payer: Self-pay | Source: Home / Self Care | Attending: Pulmonary Disease

## 2022-04-27 DIAGNOSIS — J189 Pneumonia, unspecified organism: Secondary | ICD-10-CM | POA: Insufficient documentation

## 2022-04-27 DIAGNOSIS — N400 Enlarged prostate without lower urinary tract symptoms: Secondary | ICD-10-CM | POA: Diagnosis not present

## 2022-04-27 DIAGNOSIS — Z87891 Personal history of nicotine dependence: Secondary | ICD-10-CM | POA: Insufficient documentation

## 2022-04-27 DIAGNOSIS — R0603 Acute respiratory distress: Secondary | ICD-10-CM | POA: Insufficient documentation

## 2022-04-27 DIAGNOSIS — E785 Hyperlipidemia, unspecified: Secondary | ICD-10-CM | POA: Insufficient documentation

## 2022-04-27 DIAGNOSIS — E1169 Type 2 diabetes mellitus with other specified complication: Secondary | ICD-10-CM

## 2022-04-27 DIAGNOSIS — J8410 Pulmonary fibrosis, unspecified: Secondary | ICD-10-CM | POA: Diagnosis not present

## 2022-04-27 DIAGNOSIS — E119 Type 2 diabetes mellitus without complications: Secondary | ICD-10-CM | POA: Diagnosis not present

## 2022-04-27 DIAGNOSIS — J440 Chronic obstructive pulmonary disease with acute lower respiratory infection: Secondary | ICD-10-CM | POA: Diagnosis not present

## 2022-04-27 DIAGNOSIS — Z8611 Personal history of tuberculosis: Secondary | ICD-10-CM | POA: Diagnosis not present

## 2022-04-27 DIAGNOSIS — Z572 Occupational exposure to dust: Secondary | ICD-10-CM | POA: Diagnosis not present

## 2022-04-27 DIAGNOSIS — J441 Chronic obstructive pulmonary disease with (acute) exacerbation: Secondary | ICD-10-CM | POA: Insufficient documentation

## 2022-04-27 DIAGNOSIS — I10 Essential (primary) hypertension: Secondary | ICD-10-CM | POA: Insufficient documentation

## 2022-04-27 DIAGNOSIS — J449 Chronic obstructive pulmonary disease, unspecified: Secondary | ICD-10-CM | POA: Diagnosis not present

## 2022-04-27 HISTORY — PX: BRONCHIAL WASHINGS: SHX5105

## 2022-04-27 HISTORY — PX: RIGID BRONCHOSCOPY: SHX5069

## 2022-04-27 LAB — GLUCOSE, CAPILLARY
Glucose-Capillary: 107 mg/dL — ABNORMAL HIGH (ref 70–99)
Glucose-Capillary: 114 mg/dL — ABNORMAL HIGH (ref 70–99)

## 2022-04-27 LAB — BODY FLUID CELL COUNT WITH DIFFERENTIAL
Eos, Fluid: 0 %
Lymphs, Fluid: 78 %
Monocyte-Macrophage-Serous Fluid: 1 % — ABNORMAL LOW (ref 50–90)
Neutrophil Count, Fluid: 21 % (ref 0–25)
Total Nucleated Cell Count, Fluid: 2925 cu mm — ABNORMAL HIGH (ref 0–1000)

## 2022-04-27 SURGERY — BRONCHOSCOPY, RIGID
Anesthesia: General

## 2022-04-27 MED ORDER — IPRATROPIUM-ALBUTEROL 0.5-2.5 (3) MG/3ML IN SOLN: 3.0000 mL | Freq: Once | RESPIRATORY_TRACT | Status: DC

## 2022-04-27 MED ORDER — CHLORHEXIDINE GLUCONATE 0.12 % MT SOLN: 15.0000 mL | Freq: Once | OROMUCOSAL | Status: AC

## 2022-04-27 MED ORDER — SUCCINYLCHOLINE CHLORIDE 200 MG/10ML IV SOSY
PREFILLED_SYRINGE | INTRAVENOUS | Status: AC
  Filled 2022-04-27: qty 10

## 2022-04-27 MED ORDER — IPRATROPIUM-ALBUTEROL 0.5-2.5 (3) MG/3ML IN SOLN
RESPIRATORY_TRACT | Status: AC
  Administered 2022-04-27: 3 mL via RESPIRATORY_TRACT
  Filled 2022-04-27: qty 3

## 2022-04-27 MED ORDER — LIDOCAINE HCL (CARDIAC) PF 100 MG/5ML IV SOSY
PREFILLED_SYRINGE | INTRAVENOUS | Status: DC | PRN
  Administered 2022-04-27: 100 mg via INTRAVENOUS

## 2022-04-27 MED ORDER — FENTANYL CITRATE (PF) 100 MCG/2ML IJ SOLN
INTRAMUSCULAR | Status: AC
  Filled 2022-04-27: qty 2

## 2022-04-27 MED ORDER — FENTANYL CITRATE (PF) 100 MCG/2ML IJ SOLN
INTRAMUSCULAR | Status: DC | PRN
  Administered 2022-04-27: 50 ug via INTRAVENOUS

## 2022-04-27 MED ORDER — SUCCINYLCHOLINE CHLORIDE 200 MG/10ML IV SOSY
PREFILLED_SYRINGE | INTRAVENOUS | Status: DC | PRN
  Administered 2022-04-27: 60 mg via INTRAVENOUS

## 2022-04-27 MED ORDER — FAMOTIDINE 20 MG PO TABS: 20.0000 mg | ORAL_TABLET | Freq: Once | ORAL | Status: AC

## 2022-04-27 MED ORDER — FAMOTIDINE 20 MG PO TABS
ORAL_TABLET | ORAL | Status: AC
  Administered 2022-04-27: 20 mg via ORAL
  Filled 2022-04-27: qty 1

## 2022-04-27 MED ORDER — DEXAMETHASONE SODIUM PHOSPHATE 10 MG/ML IJ SOLN
INTRAMUSCULAR | Status: DC | PRN
  Administered 2022-04-27: 5 mg via INTRAVENOUS

## 2022-04-27 MED ORDER — PROPOFOL 10 MG/ML IV BOLUS
INTRAVENOUS | Status: DC | PRN
  Administered 2022-04-27: 100 mg via INTRAVENOUS

## 2022-04-27 MED ORDER — SODIUM CHLORIDE 0.9 % IV SOLN: INTRAVENOUS | Status: DC

## 2022-04-27 MED ORDER — CHLORHEXIDINE GLUCONATE 0.12 % MT SOLN
OROMUCOSAL | Status: AC
  Administered 2022-04-27: 15 mL via OROMUCOSAL
  Filled 2022-04-27: qty 15

## 2022-04-27 MED ORDER — ALBUTEROL SULFATE (2.5 MG/3ML) 0.083% IN NEBU
INHALATION_SOLUTION | RESPIRATORY_TRACT | Status: AC
  Administered 2022-04-27: 2.5 mg via RESPIRATORY_TRACT
  Filled 2022-04-27: qty 3

## 2022-04-27 MED ORDER — ALBUTEROL SULFATE (2.5 MG/3ML) 0.083% IN NEBU: 2.5000 mg | INHALATION_SOLUTION | Freq: Once | RESPIRATORY_TRACT | Status: AC | PRN

## 2022-04-27 MED ORDER — ONDANSETRON HCL 4 MG/2ML IJ SOLN
INTRAMUSCULAR | Status: DC | PRN
  Administered 2022-04-27: 4 mg via INTRAVENOUS

## 2022-04-27 MED ORDER — LIDOCAINE HCL (PF) 2 % IJ SOLN
INTRAMUSCULAR | Status: AC
  Filled 2022-04-27: qty 5

## 2022-04-27 MED ORDER — IPRATROPIUM-ALBUTEROL 0.5-2.5 (3) MG/3ML IN SOLN: 3.0000 mL | Freq: Once | RESPIRATORY_TRACT | Status: AC

## 2022-04-27 MED ORDER — ROCURONIUM BROMIDE 10 MG/ML (PF) SYRINGE
PREFILLED_SYRINGE | INTRAVENOUS | Status: AC
  Filled 2022-04-27: qty 10

## 2022-04-27 MED ORDER — ORAL CARE MOUTH RINSE: 15.0000 mL | Freq: Once | OROMUCOSAL | Status: AC

## 2022-04-27 NOTE — Transfer of Care (Signed)
Immediate Anesthesia Transfer of Care Note  Patient: Jonathan Willis  Procedure(s) Performed: RIGID BRONCHOSCOPY BRONCHIAL WASHINGS  Patient Location: PACU  Anesthesia Type:General  Level of Consciousness: awake, alert , and oriented  Airway & Oxygen Therapy: Patient Spontanous Breathing  Post-op Assessment: Report given to RN and Post -op Vital signs reviewed and stable  Post vital signs: reviewed stable  Last Vitals:  Vitals Value Taken Time  BP 167/95 04/27/22 1300  Temp    Pulse 113 04/27/22 1301  Resp 28 04/27/22 1301  SpO2 98 % 04/27/22 1301  Vitals shown include unvalidated device data.  Last Pain:  Vitals:   04/27/22 1047  TempSrc: Temporal  PainSc: 0-No pain         Complications: No notable events documented.

## 2022-04-27 NOTE — Discharge Instructions (Signed)
AMBULATORY SURGERY  ?DISCHARGE INSTRUCTIONS ? ? ?The drugs that you were given will stay in your system until tomorrow so for the next 24 hours you should not: ? ?Drive an automobile ?Make any legal decisions ?Drink any alcoholic beverage ? ? ?You may resume regular meals tomorrow.  Today it is better to start with liquids and gradually work up to solid foods. ? ?You may eat anything you prefer, but it is better to start with liquids, then soup and crackers, and gradually work up to solid foods. ? ? ?Please notify your doctor immediately if you have any unusual bleeding, trouble breathing, redness and pain at the surgery site, drainage, fever, or pain not relieved by medication. ? ? ? ?Additional Instructions: ? ? ? ?Please contact your physician with any problems or Same Day Surgery at [phone], Monday through Friday 6 am to 4 pm, or [place] [facility] [place] Main number at [phone].  ?

## 2022-04-27 NOTE — Anesthesia Preprocedure Evaluation (Addendum)
Anesthesia Evaluation  Patient identified by MRN, date of birth, ID band Patient awake    Reviewed: Allergy & Precautions, H&P , NPO status , Patient's Chart, lab work & pertinent test results  Airway Mallampati: II  TM Distance: >3 FB Neck ROM: full    Dental  (+) Upper Dentures   Pulmonary pneumonia, unresolved, COPD (oxygen prn), former smoker History of TB COPD exacerbation secondary to influenza B 03/2022. Discharged with tamiflu and steroids Granulomatous lung disease   Pulmonary exam normal        Cardiovascular hypertension, Normal cardiovascular exam  Carotid stenosis  Recent pleuritic chest pain   Neuro/Psych negative neurological ROS  negative psych ROS   GI/Hepatic negative GI ROS, Neg liver ROS,,,  Endo/Other  diabetes, Type 2    Renal/GU      Musculoskeletal   Abdominal Normal abdominal exam  (+)   Peds  Hematology negative hematology ROS (+)   Anesthesia Other Findings Past Medical History: 01/16/2017: Chronic cough No date: COPD (chronic obstructive pulmonary disease) (HCC) No date: Diabetes mellitus without complication (HCC) No date: External hemorrhoid No date: History of chronic cough     Comment:  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 No date: History of kidney stones No date: History of TB (tuberculosis) No date: Hypertension No date: Lower GI bleed 1995: Tuberculosis  Past Surgical History: 04/10/2017: COLONOSCOPY WITH PROPOFOL; N/A     Comment:  Procedure: COLONOSCOPY WITH PROPOFOL;  Surgeon: [name], MD;  Location: [facility] ENDOSCOPY;  Service:               Gastroenterology;  Laterality: N/A; No date: NO PAST SURGERIES     Reproductive/Obstetrics negative OB ROS                             Anesthesia Physical Anesthesia Plan  ASA: 3  Anesthesia Plan: General ETT and General   Post-op Pain  Management: Minimal or no pain anticipated   Induction: Intravenous  PONV Risk Score and Plan: 2 and Ondansetron, Dexamethasone and Midazolam  Airway Management Planned: Oral ETT  Additional Equipment:   Intra-op Plan:   Post-operative Plan: Extubation in OR  Informed Consent: I have reviewed the patients History and Physical, chart, labs and discussed the procedure including the risks, benefits and alternatives for the proposed anesthesia with the patient or authorized representative who has indicated his/her understanding and acceptance.     Dental Advisory Given  Plan Discussed with: CRNA and Surgeon  Anesthesia Plan Comments:         Anesthesia Quick Evaluation

## 2022-04-27 NOTE — Anesthesia Procedure Notes (Signed)
Procedure Name: Intubation Date/Time: 04/27/2022 12:20 PM  Performed by: Hedda Slade, CRNAPre-anesthesia Checklist: Patient identified, Patient being monitored, Timeout performed, Emergency Drugs available and Suction available Patient Re-evaluated:Patient Re-evaluated prior to induction Oxygen Delivery Method: Circle system utilized Preoxygenation: Pre-oxygenation with 100% oxygen Induction Type: IV induction Ventilation: Mask ventilation without difficulty and Oral airway inserted - appropriate to patient size Laryngoscope Size: McGraph and 4 Grade View: Grade I Tube type: Oral Tube size: 8.5 mm Number of attempts: 1 Airway Equipment and Method: Stylet Placement Confirmation: ETT inserted through vocal cords under direct vision, positive ETCO2 and breath sounds checked- equal and bilateral Secured at: 21 cm Tube secured with: Tape Dental Injury: Teeth and Oropharynx as per pre-operative assessment

## 2022-04-27 NOTE — Procedures (Signed)
PROCEDURE: BRONCHOSCOPY Therapeutic Aspiration of Tracheobronchial Tree and Bronchoalveolar lavage  PROCEDURE DATE: 04/27/2022  TIME:  NAME:  Jonathan Willis  DOB:June 19, 1945  MRN: [id number] LOC:  ARPO/None    HOSP DAY: @LENGTHOFSTAYDAYS @ CODE STATUS:   Code Status History     Date Active Date Inactive Code Status Order ID Comments User Context   04/23/2022 1818 04/25/2022 2138 Full Code [id number]  Ivor Costa, MD ED   02/20/2021 2240 02/22/2021 1602 Full Code [id number]  Athena Masse, MD ED   09/02/2020 0944 09/04/2020 1849 Full Code [id number]  Collier Bullock, MD ED   01/21/2017 0757 01/23/2017 1735 Full Code [id number]  Saundra Shelling, MD Inpatient    Questions for Most Recent Historical Code Status (Order [id number])     Question Answer   By: Consent: discussion documented in EHR                Indications/Preliminary Diagnosis:   Consent: (Place X beside choice/s below)  The benefits, risks and possible complications of the procedure were        explained to:  __x_ patient  ___ patient's family  ___ other:___________  who verbalized understanding and gave:  ___ verbal  __x_ written  ___ verbal and written  ___ telephone  ___ other:________ consent.      Unable to obtain consent; procedure performed on emergent basis.     Other:          PROCEDURE DETAILS: Timeout performed and correct patient, name, & ID confirmed. Following prep per Pulmonary policy, appropriate sedation was administered. The Bronchoscope was inserted in to oral cavity with bite block in place. Therapeutic aspiration of Tracheobronchial tree was performed.  Airway exam proceeded with findings, technical procedures, and specimen collection as noted below. At the end of exam the scope was withdrawn without incident. Impression and Plan as noted below.           Airway Prep (Place X beside choice below)  x 1% Transtracheal Lidocaine Anesthetization 7 cc   Patient prepped per Bronchoscopy Lab Policy        Insertion Route (Place X beside choice below)   Nasal   Oral  x Endotracheal Tube   Tracheostomy   INTRAPROCEDURE MEDICATIONS: AS PER ANESTHESIA  Medication Amt Dose  Medication Amt Dose  Lidocaine 1% 9 cc  Epinephrine 1:10,000 sol  cc  Xylocaine 4%  cc  Cocaine  cc   TECHNICAL PROCEDURES: (Place X beside choice below)   Procedures  Description    None     Electrocautery     Cryotherapy     Balloon Dilatation     Bronchography     Stent Placement   X  Therapeutic Aspiration AT RUL, LLL    Laser/Argon Plasma    Brachytherapy Catheter Placement    Foreign Body Removal         SPECIMENS (Sites): (Place X beside choice below)  Specimens Description   No Specimens Obtained     Washings   X Lavage LLL   Biopsies    Fine Needle Aspirates    Brushings    Sputum    FINDINGS:  ESTIMATED BLOOD LOSS: none COMPLICATIONS/RESOLUTION: none      IMPRESSION:POST-PROCEDURE DX:   ERYTHEMATOUS EDEMATOUS AIRWAYS WITH MUCOID IMPACTION AND EXTRINSIC COMPRESSION OF RIGHT BRONCHUS INTERMEDIUS POST RESECTION. MUCOID IMPACTION ASPIRATED AND BAL SPECIMENS SENT FOR MICRIOBIOLOGY FOR VIRAL, FUNGAL, BACTERIAL AND ATYPICAL AFB SPECIMENS.  CELL DIFF ON BAL  RECOMMENDATION/PLAN:   AWAITING CYTOLOGY AND MICROBIOLOGY.  Ottie Glazier, M.D.  Pulmonary & [place]

## 2022-04-27 NOTE — H&P (Signed)
PULMONOLOGY         Date: 04/27/2022,   MRN# [id number] Jonathan Willis [birth year]     AdmissionWeight: 51.3 kg                 CurrentWeight: 51.3 kg  Referring provider: Dr Raul Del    CHIEF COMPLAINT:   Recurrent respiratory distress with atypical pneumonia   HISTORY OF PRESENT ILLNESS   [name] is a 77 y.o. male with medical history significant of COPD on 2-3L of prn O2, hypertension, hyperlipidemia, diabetes mellitus, lower GI bleeding, BPH, carotid artery stenosis, tuberculosis in the past, granulomatous lung disease, who presents with chronic productive cough and dyspnea. Despite maximal medical management and recurrent bursts of antibiotics and prednisone he continues to decline.  He is here today for airway inspection with therapeutic aspiration of tracheobronchial tree and bronchoalveolar lavage.    -Reviewed risks/complications and benefits with patient, risks include infection, pneumothorax/pneumomediastinum which may require chest tube placement as well as overnight/prolonged hospitalization and possible mechanical ventilation. Other risks include bleeding and very rarely death.  Patient understands risks and wishes to proceed.  Additional questions were answered, and patient is aware that post procedure patient will be going home with family and may experience cough with possible clots on expectoration as well as phlegm which may last few days as well as hoarseness of voice post intubation and mechanical ventilation.    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   History of kidney stones    History of TB (tuberculosis)     Hypertension    Lower GI bleed    Tuberculosis 1995     SURGICAL HISTORY   Past Surgical History:  Procedure Laterality Date   COLONOSCOPY WITH PROPOFOL N/A 04/10/2017   Procedure: COLONOSCOPY WITH PROPOFOL;  Surgeon: [name], MD;  Location[facility] ENDOSCOPY;  Service: Gastroenterology;  Laterality: N/A;   NO PAST SURGERIES       FAMILY HISTORY   Family History  Problem Relation Age of Onset   COPD Neg Hx    Diabetes Mellitus II Neg Hx    Hypertension Neg Hx      SOCIAL HISTORY   Social History   Tobacco Use   Smoking status: Former    Packs/day: 1.50    Years: 44.00    Total pack years: 66.00    Types: Cigarettes    Quit date: 01/11/2007    Years since quitting: 15.3   Smokeless tobacco: Former  Scientific laboratory technician Use: Never used  Substance Use Topics   Alcohol use: Yes    Comment: very rarely   Drug use: Never     MEDICATIONS    Home Medication:    Current Medication:  Current Facility-Administered Medications:    0.9 %  sodium chloride infusion, , Intravenous, Continuous, [place] Ouch, MD, Last Rate: 10 mL/hr at 04/27/22 1135, New Bag at 04/27/22 1135   ipratropium-albuterol (DUONEB) 0.5-2.5 (3) MG/3ML nebulizer solution 3 mL, 3 mL, Nebulization, Once, Wynetta Emery, Liliane Shi, MD    ALLERGIES   Patient has no known allergies.     REVIEW OF SYSTEMS  Review of Systems:  Gen:  Denies  fever, sweats, chills weigh loss  HEENT: Denies blurred vision, double vision, ear pain, eye pain, hearing loss, nose bleeds, sore throat Cardiac:  No dizziness, chest pain or heaviness, chest tightness,edema Resp:   reports dyspnea chronically  Gi: Denies swallowing difficulty, stomach pain, nausea or vomiting, diarrhea, constipation, bowel incontinence Gu:  Denies bladder incontinence, burning urine Ext:   Denies Joint pain, stiffness or swelling Skin: Denies  skin rash, easy bruising or bleeding or hives Endoc:  Denies polyuria,  polydipsia , polyphagia or weight change Psych:   Denies depression, insomnia or hallucinations   Other:  All other systems negative   VS: BP (!) 165/92   Pulse 92   Temp 99 F (37.2 C) (Temporal)   Resp 16   Ht 5' 1"  (1.549 m)   Wt 51.3 kg   SpO2 97%   BMI 21.37 kg/m      PHYSICAL EXAM    GENERAL:NAD, no fevers, chills, no weakness no fatigue HEAD: Normocephalic, atraumatic.  EYES: Pupils equal, round, reactive to light. Extraocular muscles intact. No scleral icterus.  MOUTH: Moist mucosal membrane. Dentition intact. No abscess noted.  EAR, NOSE, THROAT: Clear without exudates. No external lesions.  NECK: Supple. No thyromegaly. No nodules. No JVD.  PULMONARY: decreased breath sounds with mild rhonchi worse at bases bilaterally.  CARDIOVASCULAR: S1 and S2. Regular rate and rhythm. No murmurs, rubs, or gallops. No edema. Pedal pulses 2+ bilaterally.  GASTROINTESTINAL: Soft, nontender, nondistended. No masses. Positive bowel sounds. No hepatosplenomegaly.  MUSCULOSKELETAL: No swelling, clubbing, or edema. Range of motion full in all extremities.  NEUROLOGIC: Cranial nerves II through XII are intact. No gross focal neurological deficits. Sensation intact. Reflexes intact.  SKIN: No ulceration, lesions, rashes, or cyanosis. Skin warm and dry. Turgor intact.  PSYCHIATRIC: Mood, affect within normal limits. The patient is awake, alert and oriented x 3. Insight, judgment intact.       IMAGING     ASSESSMENT/PLAN   Recurrent respiratory distress with chronic cough    - history of TB and granulomatous disease     - presents with signs of refractory atypical pneumonia     - today performing microbiology with cytology for infectious etiology    - plan for bronch with therapeutic aspiration of tracheobronchial tree and BAL.  -Reviewed risks/complications and benefits with patient, risks include infection, pneumothorax/pneumomediastinum which may require chest tube placement as  well as overnight/prolonged hospitalization and possible mechanical ventilation. Other risks include bleeding and very rarely death.  Patient understands risks and wishes to proceed.  Additional questions were answered, and patient is aware that post procedure patient will be going home with family and may experience cough with possible clots on expectoration as well as phlegm which may last few days as well as hoarseness of voice post intubation and mechanical ventilation.            Thank you for allowing me to participate in the care of this patient.   Patient/Family are satisfied with care plan and all questions have been answered.    Provider disclosure: Patient with at least one acute or chronic illness or injury that poses a threat to life or bodily function and is being managed actively during this encounter.  All of the below services have been performed independently by signing provider:  review of prior documentation from internal and or external health records.  Review of previous and current lab results.  Interview and comprehensive assessment  during patient visit today. Review of current and previous chest radiographs/CT scans. Discussion of management and test interpretation with health care team and patient/family.   This document was prepared using Dragon voice recognition software and may include unintentional dictation errors.     Ottie Glazier, M.D.  Division of Pulmonary & Critical Care Medicine

## 2022-04-28 ENCOUNTER — Inpatient Hospital Stay: Payer: Medicare Other | Admitting: Internal Medicine

## 2022-04-28 NOTE — Anesthesia Postprocedure Evaluation (Signed)
Anesthesia Post Note  Patient: Jonathan Willis  Procedure(s) Performed: [place]  Patient location during evaluation: PACU Anesthesia Type: General Level of consciousness: awake and alert Pain management: pain level controlled Vital Signs Assessment: post-procedure vital signs reviewed and stable Respiratory status: spontaneous breathing, nonlabored ventilation and respiratory function stable Cardiovascular status: blood pressure returned to baseline and stable Postop Assessment: no apparent nausea or vomiting Anesthetic complications: no   No notable events documented.   Last Vitals:  Vitals:   04/27/22 1401 04/27/22 1435  BP: (!) 155/89 (!) 149/82  Pulse: (!) 102 94  Resp: 16 16  Temp: (!) 36.3 C 36.6 C  SpO2: 94% 97%    Last Pain:  Vitals:   04/27/22 1435  TempSrc: Temporal  PainSc: 0-No pain                 Iran Ouch

## 2022-04-29 LAB — ACID FAST SMEAR (AFB, MYCOBACTERIA): Acid Fast Smear: NEGATIVE

## 2022-04-29 LAB — CULTURE, BAL-QUANTITATIVE W GRAM STAIN: Culture: 40000 — AB

## 2022-04-30 LAB — ASPERGILLUS ANTIGEN, BAL/SERUM: Aspergillus Ag, BAL/Serum: 0.23 Index (ref 0.00–0.49)

## 2022-05-02 LAB — CYTOLOGY - NON PAP

## 2022-05-05 DIAGNOSIS — J8410 Pulmonary fibrosis, unspecified: Secondary | ICD-10-CM | POA: Diagnosis not present

## 2022-05-06 LAB — VIRUS CULTURE

## 2022-05-22 ENCOUNTER — Other Ambulatory Visit: Payer: Self-pay | Admitting: Internal Medicine

## 2022-05-22 DIAGNOSIS — E1169 Type 2 diabetes mellitus with other specified complication: Secondary | ICD-10-CM

## 2022-05-23 NOTE — Telephone Encounter (Signed)
Requested Prescriptions  Pending Prescriptions Disp Refills   metFORMIN (GLUCOPHAGE) 500 MG tablet [Pharmacy Med Name: METFORMIN HCL 500 MG TABLET] 180 tablet 0    Sig: TAKE 1 TABLET BY MOUTH 2 TIMES DAILY WITH A MEAL.     Endocrinology:  Diabetes - Biguanides Failed - 05/22/2022  8:38 AM      Failed - B12 Level in normal range and within 720 days    No results found for: "VITAMINB12"       Passed - Cr in normal range and within 360 days    Creat  Date Value Ref Range Status  02/22/2022 0.95 0.70 - 1.28 mg/dL Final   Creatinine, Ser  Date Value Ref Range Status  04/25/2022 1.01 0.61 - 1.24 mg/dL Final   Creatinine, Urine  Date Value Ref Range Status  02/22/2022 58 20 - 320 mg/dL Final         Passed - HBA1C is between 0 and 7.9 and within 180 days    Hgb A1c MFr Bld  Date Value Ref Range Status  02/22/2022 6.9 (H) <5.7 % of total Hgb Final    Comment:    For someone without known diabetes, a hemoglobin A1c value of 6.5% or greater indicates that they may have  diabetes and this should be confirmed with a follow-up  test. . For someone with known diabetes, a value <7% indicates  that their diabetes is well controlled and a value  greater than or equal to 7% indicates suboptimal  control. A1c targets should be individualized based on  duration of diabetes, age, comorbid conditions, and  other considerations. . Currently, no consensus exists regarding use of hemoglobin A1c for diagnosis of diabetes for children. .          Passed - eGFR in normal range and within 360 days    GFR, Est African American  Date Value Ref Range Status  10/16/2018 85 > OR = 60 mL/min/1.76m Final   GFR calc Af Amer  Date Value Ref Range Status  06/06/2019 >60 >60 mL/min Final   GFR, Est Non African American  Date Value Ref Range Status  10/16/2018 73 > OR = 60 mL/min/1.754mFinal   GFR, Estimated  Date Value Ref Range Status  04/25/2022 >60 >60 mL/min Final    Comment:     (NOTE) Calculated using the CKD-EPI Creatinine Equation (2021)    eGFR  Date Value Ref Range Status  02/22/2022 82 > OR = 60 mL/min/1.734minal         Passed - Valid encounter within last 6 months    Recent Outpatient Visits           3 weeks ago Sepsis, due to unspecified organism, unspecified whether acute organ dysfunction present ([facility]   3 months ago Paresthesia of right upper extremity   [place]   1 year ago Type 2 diabetes mellitus with other specified complication, without long-term current use of insulin ([facility]   1 year ago [facility] exacerbation ([facility]   2 years ago Type 2 diabetes mellitus with other specified complication, without long-term current use of insulin ([facility] [place]  Passed - CBC within normal limits and completed in the last 12 months    WBC  Date Value Ref Range Status  04/25/2022 8.9 4.0 - 10.5 K/uL Final   RBC  Date Value Ref Range Status  04/25/2022 3.99 (L) 4.22 - 5.81 MIL/uL Final   Hemoglobin  Date Value Ref Range Status  04/25/2022 12.3 (L) 13.0 - 17.0 g/dL Final   HGB  Date Value Ref Range Status  02/06/2014 14.1 13.0 - 18.0 g/dL Final   HCT  Date Value Ref Range Status  04/25/2022 37.9 (L) 39.0 - 52.0 % Final  02/06/2014 42.3 40.0 - 52.0 % Final   MCHC  Date Value Ref Range Status  04/25/2022 32.5 30.0 - 36.0 g/dL Final   [facility]  Date Value Ref Range Status  04/25/2022 30.8 26.0 - 34.0 pg Final   MCV  Date Value Ref Range Status  04/25/2022 95.0 80.0 - 100.0 fL Final  02/06/2014 95 80 - 100 fL Final   No results found for: "PLTCOUNTKUC", "LABPLAT", "POCPLA" RDW  Date Value Ref Range Status  04/25/2022 12.3 11.5 - 15.5  % Final  02/06/2014 13.0 11.5 - 14.5 % Final          rosuvastatin (CRESTOR) 10 MG tablet [Pharmacy Med Name: ROSUVASTATIN CALCIUM 10 MG TAB] 90 tablet 0    Sig: TAKE 1 TABLET BY MOUTH EVERY DAY     Cardiovascular:  Antilipid - Statins 2 Failed - 05/22/2022  8:38 AM      Failed - Lipid Panel in normal range within the last 12 months    Cholesterol  Date Value Ref Range Status  02/22/2022 276 (H) <200 mg/dL Final   LDL Cholesterol (Calc)  Date Value Ref Range Status  02/22/2022 170 (H) mg/dL (calc) Final    Comment:    Reference range: <100 . Desirable range <100 mg/dL for primary prevention;   <70 mg/dL for patients with CHD or diabetic patients  with > or = 2 CHD risk factors. Marland Kitchen LDL-C is now calculated using the Martin-Hopkins  calculation, which is a validated novel method providing  better accuracy than the Friedewald equation in the  estimation of LDL-C.  Cresenciano Genre et al. Annamaria Helling. [id number]): 2061-2068  (http://education.[employer].com/faq/FAQ164)    HDL  Date Value Ref Range Status  02/22/2022 48 > OR = 40 mg/dL Final   Triglycerides  Date Value Ref Range Status  02/22/2022 340 (H) <150 mg/dL Final    Comment:    . If a non-fasting specimen was collected, consider repeat triglyceride testing on a fasting specimen if clinically indicated.  Yates Decamp et al. J. of Clin. Lipidol. [id number]. Marland Kitchen          Passed - Cr in normal range and within 360 days    Creat  Date Value Ref Range Status  02/22/2022 0.95 0.70 - 1.28 mg/dL Final   Creatinine, Ser  Date Value Ref Range Status  04/25/2022 1.01 0.61 - 1.24 mg/dL Final   Creatinine, Urine  Date Value Ref Range Status  02/22/2022 58 20 - 320 mg/dL Final         Passed - Patient is not pregnant      Passed - Valid encounter within last 12 months    Recent Outpatient Visits           3 weeks ago Sepsis, due to unspecified organism, unspecified whether acute organ dysfunction present [facility])   [facility] [place], Coralie Keens, [place]  3 months ago Paresthesia of right upper extremity   [facility] [place], [place] W, NP   1 year ago Type 2 diabetes mellitus with other specified complication, without long-term current use of insulin [facility])   [facility] [place], Coralie Keens, NP   1 year ago COPD exacerbation [facility])   [place], DO   2 years ago Type 2 diabetes mellitus with other specified complication, without long-term current use of insulin [facility])   [facility] [place], Lupita Raider, [place]

## 2022-05-26 DIAGNOSIS — J8410 Pulmonary fibrosis, unspecified: Secondary | ICD-10-CM | POA: Diagnosis not present

## 2022-05-26 LAB — FUNGUS CULTURE RESULT

## 2022-05-26 LAB — FUNGAL ORGANISM REFLEX

## 2022-05-26 LAB — FUNGUS CULTURE WITH STAIN

## 2022-06-11 LAB — ACID FAST CULTURE WITH REFLEXED SENSITIVITIES (MYCOBACTERIA): Acid Fast Culture: NEGATIVE

## 2022-07-18 ENCOUNTER — Emergency Department
Admission: EM | Admit: 2022-07-18 | Discharge: 2022-07-18 | Disposition: A | Discharge status: Home / Self Care | Payer: Medicare Other | Attending: Emergency Medicine | Admitting: Emergency Medicine

## 2022-07-18 ENCOUNTER — Encounter: Payer: Self-pay | Admitting: Emergency Medicine

## 2022-07-18 ENCOUNTER — Other Ambulatory Visit: Payer: Self-pay

## 2022-07-18 ENCOUNTER — Emergency Department: Payer: Medicare Other

## 2022-07-18 DIAGNOSIS — J431 Panlobular emphysema: Secondary | ICD-10-CM

## 2022-07-18 DIAGNOSIS — R079 Chest pain, unspecified: Secondary | ICD-10-CM | POA: Diagnosis not present

## 2022-07-18 DIAGNOSIS — I10 Essential (primary) hypertension: Secondary | ICD-10-CM | POA: Insufficient documentation

## 2022-07-18 DIAGNOSIS — J441 Chronic obstructive pulmonary disease with (acute) exacerbation: Secondary | ICD-10-CM | POA: Insufficient documentation

## 2022-07-18 DIAGNOSIS — R0789 Other chest pain: Secondary | ICD-10-CM | POA: Diagnosis not present

## 2022-07-18 DIAGNOSIS — R0602 Shortness of breath: Secondary | ICD-10-CM | POA: Diagnosis not present

## 2022-07-18 LAB — CBC
HCT: 51.2 % (ref 39.0–52.0)
Hemoglobin: 16.8 g/dL (ref 13.0–17.0)
MCH: 30.9 pg (ref 26.0–34.0)
MCHC: 32.8 g/dL (ref 30.0–36.0)
MCV: 94.1 fL (ref 80.0–100.0)
Platelets: 270 10*3/uL (ref 150–400)
RBC: 5.44 MIL/uL (ref 4.22–5.81)
RDW: 13 % (ref 11.5–15.5)
WBC: 7.1 10*3/uL (ref 4.0–10.5)
nRBC: 0 % (ref 0.0–0.2)

## 2022-07-18 LAB — TROPONIN I (HIGH SENSITIVITY): Troponin I (High Sensitivity): 4 ng/L (ref <18)

## 2022-07-18 LAB — BASIC METABOLIC PANEL
Anion gap: 11 (ref 5–15)
BUN: 18 mg/dL (ref 8–23)
CO2: 27 mmol/L (ref 22–32)
Calcium: 9.7 mg/dL (ref 8.9–10.3)
Chloride: 101 mmol/L (ref 98–111)
Creatinine, Ser: 0.93 mg/dL (ref 0.61–1.24)
GFR, Estimated: >60 mL/min (ref >60)
Glucose, Bld: 132 mg/dL — ABNORMAL HIGH (ref 70–99)
Potassium: 4.2 mmol/L (ref 3.5–5.1)
Sodium: 139 mmol/L (ref 135–145)

## 2022-07-18 MED ORDER — IPRATROPIUM-ALBUTEROL 0.5-2.5 (3) MG/3ML IN SOLN
6.0000 mL | Freq: Once | RESPIRATORY_TRACT | Status: AC
  Administered 2022-07-18: 6 mL via RESPIRATORY_TRACT
  Filled 2022-07-18: qty 3

## 2022-07-18 MED ORDER — ALBUTEROL SULFATE HFA 108 (90 BASE) MCG/ACT IN AERS: 2.0000 | INHALATION_SPRAY | Freq: Four times a day (QID) | RESPIRATORY_TRACT | 1 refills | Status: DC | PRN

## 2022-07-18 MED ORDER — DOXYCYCLINE MONOHYDRATE 100 MG PO TABS: 100.0000 mg | ORAL_TABLET | Freq: Two times a day (BID) | ORAL | 0 refills | Status: AC

## 2022-07-18 MED ORDER — METHYLPREDNISOLONE SODIUM SUCC 125 MG IJ SOLR
125.0000 mg | Freq: Once | INTRAMUSCULAR | Status: AC
  Administered 2022-07-18: 125 mg via INTRAVENOUS
  Filled 2022-07-18: qty 2

## 2022-07-18 MED ORDER — PREDNISONE 50 MG PO TABS: 50.0000 mg | ORAL_TABLET | Freq: Every day | ORAL | 0 refills | Status: AC

## 2022-07-18 NOTE — ED Provider Notes (Signed)
[facility] Provider Note    Event Date/Time   First MD Initiated Contact with Patient 07/18/22 1002     (approximate)   History   Chest Pain  Formal translating service utilized.  Patient speaks [place] HPI  Jonathan Willis is a 77 y.o. male past medical history significant for COPD on 2 L home oxygen, hypertension, hyperlipidemia, who presents to the emergency department with chest pain and shortness of breath.  Over the past 2 to 3 days endorses a nonproductive cough.  Progressively worsening shortness of breath and chest tightness.  Feels like it is difficult to catch his breath.  States that it feels similar to prior episodes of COPD.  States that he normally wears some oxygen at baseline.  Believes that he is out of his inhaler.  No recent antibiotics or steroids.  No recent hospitalizations since January.  Prior stress testing but does not believe that he has had a prior cardiac catheterization.  Denies history of DVT or PE.  No recent surgery or travel.  Denies any blood in his stool.  No abdominal pain, nausea, vomiting or diarrhea.  States that he feels more tired than normal.     Physical Exam   Triage Vital Signs: ED Triage Vitals [07/18/22 0927]  Enc Vitals Group     BP 131/67     Pulse Rate 72     Resp 18     Temp (!) 97.5 F (36.4 C)     Temp Source Oral     SpO2 100 %     Weight      Height      Head Circumference      Peak Flow      Pain Score 7     Pain Loc      Pain Edu?      Excl. in GC?     Most recent vital signs: Vitals:   07/18/22 0927  BP: 131/67  Pulse: 72  Resp: 18  Temp: (!) 97.5 F (36.4 C)  SpO2: 100%    Physical Exam Constitutional:      Appearance: He is well-developed.  HENT:     Head: Atraumatic.  Eyes:     Conjunctiva/sclera: Conjunctivae normal.  Cardiovascular:     Rate and Rhythm: Regular rhythm.     Pulses:          Dorsalis pedis pulses are 2+ on the right side and 2+ on the left side.   Pulmonary:     Effort: No respiratory distress.     Breath sounds: Wheezing (Diffuse inspiratory and expiratory wheezing throughout all lung fields is worse to the right side) present.  Musculoskeletal:     Cervical back: Normal range of motion.     Right lower leg: No tenderness. No edema.     Left lower leg: No tenderness. No edema.  Skin:    General: Skin is warm.     Capillary Refill: Capillary refill takes less than 2 seconds.  Neurological:     General: No focal deficit present.     Mental Status: He is alert. Mental status is at baseline.  Psychiatric:        Mood and Affect: Mood normal.     IMPRESSION / MDM / ASSESSMENT AND PLAN / ED COURSE  I reviewed the triage vital signs and the nursing notes.  Differential diagnosis including COPD exacerbation, pneumonia, viral illness, ACS, pericarditis.  Low suspicion for pulmonary embolism given his wheezing on  exam.  On chart review patient has had a PE study during his last hospitalization that was negative.  EKG  I, Corena Herter, the attending physician, personally viewed and interpreted this ECG.   Rate: Normal  Rhythm: Normal sinus  Axis: Normal  Intervals: Normal  ST&T Change: Mild ST elevation to the septal leads.  No change when compared to prior EKG.  No ST depression or reciprocal changes.  No tachycardic or bradycardic dysrhythmias while on cardiac telemetry.  RADIOLOGY I independently reviewed imaging, my interpretation of imaging: Chest x-ray with findings of emphysema but no focal findings consistent with pneumonia.  No cardiomegaly or signs of pulmonary edema.  Read as no acute findings.  LABS (all labs ordered are listed, but only abnormal results are displayed) Labs interpreted as -    Labs Reviewed  BASIC METABOLIC PANEL - Abnormal; Notable for the following components:      Result Value   Glucose, Bld 132 (*)    All other components within normal limits  CBC  TROPONIN I (HIGH SENSITIVITY)   TROPONIN I (HIGH SENSITIVITY)     MDM  Clinical picture is most consistent with COPD exacerbation.  Diffuse wheezing on exam with productive cough.  Given DuoNeb treatment in the emergency department and IV Solu-Medrol.  No focal findings consistent with pneumonia but given his productive cough will start the patient on antibiotics with doxycycline.  No recent antibiotic use.  Low suspicion for ACS, atypical chest pain, initial troponin is negative.  Low risk Wells criteria.  Will start the patient on prednisone, doxycycline and albuterol.  Given information to follow-up with his primary care provider and his pulmonologist.  Given return precautions for any ongoing or worsening symptoms.  No questions at time of discharge.    PROCEDURES:  Critical Care performed: No  Procedures  Patient's presentation is most consistent with acute presentation with potential threat to life or bodily function.   MEDICATIONS ORDERED IN ED: Medications  ipratropium-albuterol (DUONEB) 0.5-2.5 (3) MG/3ML nebulizer solution 6 mL (has no administration in time range)  methylPREDNISolone sodium succinate (SOLU-MEDROL) 125 mg/2 mL injection 125 mg (has no administration in time range)    FINAL CLINICAL IMPRESSION(S) / ED DIAGNOSES   Final diagnoses:  Chronic obstructive pulmonary disease with acute exacerbation     Rx / DC Orders   ED Discharge Orders          Ordered    predniSONE (DELTASONE) 50 MG tablet  Daily with breakfast        07/18/22 1102    doxycycline (ADOXA) 100 MG tablet  2 times daily        07/18/22 1102    albuterol (VENTOLIN HFA) 108 (90 Base) MCG/ACT inhaler  Every 6 hours PRN       Note to Pharmacy: Product selection permitted per insurance preference.   07/18/22 1103             Note:  This document was prepared using Dragon voice recognition software and may include unintentional dictation errors.   Corena Herter, MD 07/18/22 1116

## 2022-07-18 NOTE — ED Triage Notes (Signed)
Patient to ED with mid center chest pain and SOB. Pain ongoing since yesterday. Hx of COPD, denies cardiac hx.

## 2022-07-19 ENCOUNTER — Ambulatory Visit (INDEPENDENT_AMBULATORY_CARE_PROVIDER_SITE_OTHER): Payer: Medicare Other | Admitting: Internal Medicine

## 2022-07-19 ENCOUNTER — Ambulatory Visit: Payer: Self-pay | Admitting: *Deleted

## 2022-07-19 ENCOUNTER — Encounter: Payer: Self-pay | Admitting: Internal Medicine

## 2022-07-19 VITALS — BP 128/51 | HR 89 | Ht 61.0 in | Wt 110.6 lb

## 2022-07-19 DIAGNOSIS — J441 Chronic obstructive pulmonary disease with (acute) exacerbation: Secondary | ICD-10-CM

## 2022-07-19 DIAGNOSIS — R42 Dizziness and giddiness: Secondary | ICD-10-CM

## 2022-07-19 MED ORDER — MECLIZINE HCL 25 MG PO TABS: 25.0000 mg | ORAL_TABLET | Freq: Three times a day (TID) | ORAL | 2 refills | Status: DC | PRN

## 2022-07-19 MED ORDER — BUTALBITAL-APAP-CAFFEINE 50-325-40 MG PO TABS: 1.0000 | ORAL_TABLET | Freq: Four times a day (QID) | ORAL | 0 refills | Status: DC | PRN

## 2022-07-19 NOTE — Progress Notes (Signed)
Subjective:    Patient ID: Jonathan Willis, male    DOB: 11-Oct-1945, 77 y.o.   MRN: [id number]  HPI  Patient presents to clinic today with complaint of headache and dizziness.  He reports this started 4 days ago.  He describes the dizziness as a sensation that the room is spinning.  MRI brain from 08/2021 showed:  IMPRESSION: Intermittently motion degraded examination, as described. No evidence of acute intracranial abnormality. Mild chronic small vessel ischemic changes within the cerebral white matter. Mild generalized cerebral and cerebellar atrophy.  He is prescribed meclizine which works well but reports he is currently out of this medication.  He has seen neurology at [facility] clinic for the same.  Of note, he was seen in the ER yesterday for cough and shortness of breath.  He was diagnosed with a COPD exacerbation.  Labs were unremarkable.  X-ray did not show any evidence of infiltrate.  He was treated with Doxycycline and Prednisone, discharged advised to follow-up with his PCP.     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 8 g 1   Blood Glucose Monitoring Suppl (ACCU-CHEK AVIVA CONNECT) w/Device KIT 1 Device by Does not apply route daily. Dx E11.4 1 kit 0   doxycycline (ADOXA) 100 MG tablet Take 1 tablet (100 mg total) by mouth 2  (two) times daily for 5 days. 10 tablet 0   Fluticasone-Umeclidin-Vilant (TRELEGY ELLIPTA) 100-62.5-25 MCG/INH AEPB Inhale 1 puff into the lungs daily. 30 each 5   guaiFENesin-dextromethorphan (ROBITUSSIN DM) 100-10 MG/5ML syrup Take 5 mLs by mouth every 4 (four) hours as needed for cough. 118 mL 0   ipratropium-albuterol (DUONEB) 0.5-2.5 (3) MG/3ML SOLN Take 3 mLs by nebulization.     meclizine (ANTIVERT) 25 MG tablet Take 1 tablet (25 mg total) by mouth 3 (three) times daily as needed for dizziness. 30 tablet 0   metFORMIN (GLUCOPHAGE) 500 MG tablet TAKE 1 TABLET BY MOUTH 2 TIMES DAILY WITH A MEAL. 180 tablet 0   OXYGEN Inhale into the lungs. 3 Liters     predniSONE (DELTASONE) 50 MG tablet Take 1 tablet (50 mg total) by mouth daily with breakfast for 5 days. 5 tablet 0   promethazine-dextromethorphan (PROMETHAZINE-DM) 6.25-15 MG/5ML syrup Take 5 mLs by mouth 4 (four) times daily as needed. 118 mL 0   rosuvastatin (CRESTOR) 10 MG tablet TAKE 1 TABLET BY MOUTH EVERY DAY 90 tablet 0   sulfamethoxazole-trimethoprim (BACTRIM) 400-80 MG tablet Take 1 tablet by mouth 3 (three) times a week.     No current facility-administered medications for this visit.    No Known Allergies  Family History  Problem Relation Age of Onset   COPD Neg Hx    Diabetes Mellitus II Neg Hx  Hypertension Neg Hx     Social History   Socioeconomic History   Marital status: Divorced    Spouse name: Not on file   Number of children: Not on file   Years of education: 9   Highest education level: 9th grade  Occupational History   Occupation: retired  Tobacco Use   Smoking status: Former    Packs/day: 1.50    Years: 44.00    Additional pack years: 0.00    Total pack years: 66.00    Types: Cigarettes    Quit date: 01/11/2007    Years since quitting: 15.5   Smokeless tobacco: Former  Building services engineer Use: Never used  Substance and Sexual Activity   Alcohol use: Yes    Comment: very rarely   Drug use:  Never   Sexual activity: Yes    Birth control/protection: None  Other Topics Concern   Not on file  Social History Narrative   Not on file   Social Determinants of Health   Financial Resource Strain: High Risk (03/04/2022)   Overall Financial Resource Strain (CARDIA)    Difficulty of Paying Living Expenses: Very hard  Food Insecurity: No Food Insecurity (04/24/2022)   Hunger Vital Sign    Worried About Running Out of Food in the Last Year: Never true    Ran Out of Food in the Last Year: Never true  Transportation Needs: No Transportation Needs (04/24/2022)   PRAPARE - Administrator, Civil Service (Medical): No    Lack of Transportation (Non-Medical): No  Physical Activity: Insufficiently Active (03/04/2022)   Exercise Vital Sign    Days of Exercise per Week: 4 days    Minutes of Exercise per Session: 30 min  Stress: No Stress Concern Present (03/04/2022)   [employer] of Occupational Health - Occupational Stress Questionnaire    Feeling of Stress : Not at all  Social Connections: Moderately Isolated (03/04/2022)   Social Connection and Isolation Panel [NHANES]    Frequency of Communication with Friends and Family: More than three times a week    Frequency of Social Gatherings with Friends and Family: More than three times a week    Attends Religious Services: 1 to 4 times per year    Active Member of [employer] or Organizations: No    Attends Banker Meetings: Never    Marital Status: Divorced  Catering manager Violence: Not At Risk (04/24/2022)   Humiliation, Afraid, Rape, and Kick questionnaire    Fear of Current or Ex-Partner: No    Emotionally Abused: No    Physically Abused: No    Sexually Abused: No     Constitutional: Patient reports headache.  Denies fever, malaise, fatigue, or abrupt weight changes.  HEENT: Denies eye pain, eye redness, ear pain, ringing in the ears, wax buildup, runny nose, nasal congestion, bloody nose, or sore  throat. Respiratory: Patient reports cough and shortness of breath.  Denies difficulty breathing..   Cardiovascular: Denies chest pain, chest tightness, palpitations or swelling in the hands or feet.  Gastrointestinal: Denies abdominal pain, bloating, constipation, diarrhea or blood in the stool.  GU: Denies urgency, frequency, pain with urination, burning sensation, blood in urine, odor or discharge. Musculoskeletal: Denies decrease in range of motion, difficulty with gait, muscle pain or joint pain and swelling.  Skin: Denies redness, rashes, lesions or ulcercations.  Neurological: Patient reports dizziness.  Denies difficulty with memory, difficulty with speech or problems with balance and coordination.  Psych: Denies  anxiety, depression, SI/HI.  No other specific complaints in a complete review of systems (except as listed in HPI above).  Objective:   Physical Exam  BP (!) 128/51 (BP Location: Right Arm, Patient Position: Sitting)   Pulse 89   Ht 5\' 1"  (1.549 m)   Wt 110 lb 9.6 oz (50.2 kg)   SpO2 98%   BMI 20.90 kg/m   Wt Readings from Last 3 Encounters:  04/27/22 113 lb 1.5 oz (51.3 kg)  04/26/22 113 lb (51.3 kg)  04/23/22 110 lb 0.2 oz (49.9 kg)    General: Appears his stated age, well developed, well nourished in NAD. Skin: Warm, dry and intact.  HEENT: Head: normal shape and size; Eyes: sclera white, no icterus, conjunctiva pink, PERRLA and EOMs intact;  Cardiovascular: Normal rate and rhythm. S1,S2 noted.  No murmur, rubs or gallops noted.  Pulmonary/Chest: Normal effort and diminished breath sounds. No respiratory distress. No wheezes, rales or ronchi noted.  Musculoskeletal:  No difficulty with gait.  Neurological: Alert and oriented.  Coordination abnormal.    BMET    Component Value Date/Time   NA 139 07/18/2022 0953   NA 136 02/04/2014 0616   K 4.2 07/18/2022 0953   K 4.1 02/04/2014 0616   CL 101 07/18/2022 0953   CL 102 02/04/2014 0616   CO2 27  07/18/2022 0953   CO2 25 02/04/2014 0616   GLUCOSE 132 (H) 07/18/2022 0953   GLUCOSE 225 (H) 02/04/2014 0616   BUN 18 07/18/2022 0953   BUN 18 02/04/2014 0616   CREATININE 0.93 07/18/2022 0953   CREATININE 0.95 02/22/2022 1353   CALCIUM 9.7 07/18/2022 0953   CALCIUM 9.1 02/04/2014 0616   GFRNONAA >60 07/18/2022 0953   GFRNONAA 73 10/16/2018 0928   GFRAA >60 06/06/2019 1049   GFRAA 85 10/16/2018 0928    Lipid Panel     Component Value Date/Time   CHOL 276 (H) 02/22/2022 1353   TRIG 340 (H) 02/22/2022 1353   HDL 48 02/22/2022 1353   CHOLHDL 5.8 (H) 02/22/2022 1353   LDLCALC 170 (H) 02/22/2022 1353    CBC    Component Value Date/Time   WBC 7.1 07/18/2022 0953   RBC 5.44 07/18/2022 0953   HGB 16.8 07/18/2022 0953   HGB 14.1 02/06/2014 0345   HCT 51.2 07/18/2022 0953   HCT 42.3 02/06/2014 0345   PLT 270 07/18/2022 0953   PLT 336 02/06/2014 0345   MCV 94.1 07/18/2022 0953   MCV 95 02/06/2014 0345   MCH 30.9 07/18/2022 0953   MCHC 32.8 07/18/2022 0953   RDW 13.0 07/18/2022 0953   RDW 13.0 02/06/2014 0345   LYMPHSABS 2.8 03/30/2022 0710   LYMPHSABS 1.7 02/06/2014 0345   MONOABS 0.8 03/30/2022 0710   MONOABS 0.5 02/06/2014 0345   EOSABS 0.2 03/30/2022 0710   EOSABS 0.0 02/06/2014 0345   BASOSABS 0.1 03/30/2022 0710   BASOSABS 0.0 02/06/2014 0345    Hgb A1C Lab Results  Component Value Date   HGBA1C 6.9 (H) 02/22/2022           Assessment & Plan:   ER Follow-up for COPD exacerbation:  ER notes, labs and imaging reviewed Continue Prednisone and Doxycycline until course completed Continue Trelegy, albuterol and DuoNebs as previously prescribed Encourage smoking cessation  Acute Headache, Dizziness:  MRI brain from 08/2021 reviewed Rx for meclizine 25 mg every 8 hours as needed Rx for Fioricet-use as directed Follow-up with [facility] clinic neurology  RTC in 1 month for follow-up  of chronic conditions Nicki Reaper, NP

## 2022-07-19 NOTE — Telephone Encounter (Signed)
  Chief Complaint: Dizziness Symptoms: Spinning, needs assist to walk, not new. Headache. Seen in ED yesterday for COPD exacerbation.  Frequency: Yesterday Pertinent Negatives: Patient denies  Disposition: ED /[] Urgent Care (no appt availability in office) / Appointment(In office/virtual)/  [facility]/ Home Care/ Refused Recommended Disposition /[] [facility]/  Follow-up with PCP Additional Notes: Daughter in law calling  requesting "Med for dizziness be called in." States "Was on something before, same thing."  Pt was in ED yesterday. On prednisone and ATB. Advised needs appt. Secured appt for 2:00 today. Advised ED for worsening symptoms. Reason for Disposition  [1] MODERATE dizziness (e.g., vertigo; feels very unsteady, interferes with normal activities) AND [2] has NOT been evaluated by doctor (or NP/PA) for this    Has H/O vertigo "Maybe year ago." Requesting meds  Answer Assessment - Initial Assessment Questions 1. DESCRIPTION: "Describe your dizziness."     Spinning 2. VERTIGO: "Do you feel like either you or the room is spinning or tilting?"      Yes 3. LIGHTHEADED: "Do you feel lightheaded?" (e.g., somewhat faint, woozy, weak upon standing)     Yes 4. SEVERITY: "How bad is it?"  "Can you walk?"   - MILD: Feels slightly dizzy and unsteady, but is walking normally.   - MODERATE: Feels unsteady when walking, but not falling; interferes with normal activities (e.g., school, work).   - SEVERE: Unable to walk without falling, or requires assistance to walk without falling.     Cannot walk without assist. Not new 5. ONSET:  "When did the dizziness begin?"     Yesterday 6. AGGRAVATING FACTORS: "Does anything make it worse?" (e.g., standing, change in head position)     no 7. CAUSE: "What do you think is causing the dizziness?"     Unsure 8. RECURRENT SYMPTOM: "Have you had dizziness before?" If Yes, ask: "When was the last time?" "What happened that  time?"     Yes "On a medication 9. OTHER SYMPTOMS: "Do you have any other symptoms?" (e.g., headache, weakness, numbness, vomiting, earache)     Headache,9/10, generalized weakness, CP, SOB, seen in ED yesterday  Protocols used: Dizziness - Vertigo-A-AH

## 2022-07-19 NOTE — Patient Instructions (Signed)
Dizziness Dizziness is a common problem. It makes you feel unsteady or light-headed. You may feel like you are about to pass out (faint). Dizziness can lead to getting hurt if you stumble or fall. Dizziness can be caused by many things, including: Medicines. Not having enough water in your body (dehydration). Illness. Follow these instructions at home: Eating and drinking  Drink enough fluid to keep your pee (urine) pale yellow. This helps to keep you from getting dehydrated. Try to drink more clear fluids, such as water. Do not drink alcohol. Limit how much caffeine you drink or eat, if your doctor tells you to do that. Limit how much salt (sodium) you drink or eat, if your doctor tells you to do that. Activity  Avoid making quick movements. Stand up slowly from sitting in a chair, and steady yourself until you feel okay. In the morning, first sit up on the side of the bed. When you feel okay, stand up slowly while you hold onto something. Do this until you know that your balance is okay. If you need to stand in one place for a long time, move your legs often. Tighten and relax the muscles in your legs while you are standing. Do not drive or use machinery if you feel dizzy. Avoid bending down if you feel dizzy. Place items in your home so you can reach them easily without leaning over. Lifestyle Do not smoke or use any products that contain nicotine or tobacco. If you need help quitting, ask your doctor. Try to lower your stress level. You can do this by using methods such as yoga or meditation. Talk with your doctor if you need help. General instructions Watch your dizziness for any changes. Take over-the-counter and prescription medicines only as told by your doctor. Talk with your doctor if you think that you are dizzy because of a medicine that you are taking. Tell a friend or a family member that you are feeling dizzy. If he or she notices any changes in your behavior, have this  person call your doctor. Keep all follow-up visits. Contact a doctor if: Your dizziness does not go away. Your dizziness or light-headedness gets worse. You feel like you may vomit (are nauseous). You have trouble hearing. You have new symptoms. You are unsteady on your feet. You feel like the room is spinning. You have neck pain or a stiff neck. You have a fever. Get help right away if: You vomit or have watery poop (diarrhea), and you cannot eat or drink anything. You have trouble: Talking. Walking. Swallowing. Using your arms, hands, or legs. You feel generally weak. You are not thinking clearly, or you have trouble forming sentences. A friend or family member may notice this. You have: Chest pain. Pain in your belly (abdomen). Shortness of breath. Sweating. Your vision changes. You are bleeding. You have a very bad headache. These symptoms may be an emergency. Get help right away. Call your local emergency services (911 in the [place] Do not wait to see if the symptoms will go away. Do not drive yourself to the hospital. Summary Dizziness makes you feel unsteady or light-headed. You may feel like you are about to pass out (faint). Drink enough fluid to keep your pee (urine) pale yellow. Do not drink alcohol. Avoid making quick movements if you feel dizzy. Watch your dizziness for any changes. This information is not intended to replace advice given to you by your health care provider. Make sure you discuss any questions   you have with your health care provider. Document Revised: 02/17/2020 Document Reviewed: 02/17/2020 Elsevier Patient Education  2023 Elsevier Inc.  

## 2022-08-01 ENCOUNTER — Emergency Department: Payer: Medicare Other

## 2022-08-01 DIAGNOSIS — R0602 Shortness of breath: Secondary | ICD-10-CM | POA: Diagnosis not present

## 2022-08-01 DIAGNOSIS — J441 Chronic obstructive pulmonary disease with (acute) exacerbation: Secondary | ICD-10-CM | POA: Insufficient documentation

## 2022-08-01 DIAGNOSIS — R911 Solitary pulmonary nodule: Secondary | ICD-10-CM | POA: Diagnosis not present

## 2022-08-01 DIAGNOSIS — R519 Headache, unspecified: Secondary | ICD-10-CM | POA: Diagnosis not present

## 2022-08-01 LAB — CBC WITH DIFFERENTIAL/PLATELET
Abs Immature Granulocytes: 0.01 10*3/uL (ref 0.00–0.07)
Basophils Absolute: 0 10*3/uL (ref 0.0–0.1)
Basophils Relative: 1 %
Eosinophils Absolute: 0.1 10*3/uL (ref 0.0–0.5)
Eosinophils Relative: 1 %
HCT: 43.4 % (ref 39.0–52.0)
Hemoglobin: 14.5 g/dL (ref 13.0–17.0)
Immature Granulocytes: 0 %
Lymphocytes Relative: 46 %
Lymphs Abs: 2.9 10*3/uL (ref 0.7–4.0)
MCH: 31.5 pg (ref 26.0–34.0)
MCHC: 33.4 g/dL (ref 30.0–36.0)
MCV: 94.1 fL (ref 80.0–100.0)
Monocytes Absolute: 0.5 10*3/uL (ref 0.1–1.0)
Monocytes Relative: 8 %
Neutro Abs: 2.7 10*3/uL (ref 1.7–7.7)
Neutrophils Relative %: 44 %
Platelets: 244 10*3/uL (ref 150–400)
RBC: 4.61 MIL/uL (ref 4.22–5.81)
RDW: 12.7 % (ref 11.5–15.5)
WBC: 6.2 10*3/uL (ref 4.0–10.5)
nRBC: 0 % (ref 0.0–0.2)

## 2022-08-01 LAB — COMPREHENSIVE METABOLIC PANEL
ALT: 18 U/L (ref 0–44)
AST: 22 U/L (ref 15–41)
Albumin: 4.3 g/dL (ref 3.5–5.0)
Alkaline Phosphatase: 60 U/L (ref 38–126)
Anion gap: 9 (ref 5–15)
BUN: 10 mg/dL (ref 8–23)
CO2: 26 mmol/L (ref 22–32)
Calcium: 9 mg/dL (ref 8.9–10.3)
Chloride: 105 mmol/L (ref 98–111)
Creatinine, Ser: 0.77 mg/dL (ref 0.61–1.24)
GFR, Estimated: >60 mL/min (ref >60)
Glucose, Bld: 115 mg/dL — ABNORMAL HIGH (ref 70–99)
Potassium: 3.1 mmol/L — ABNORMAL LOW (ref 3.5–5.1)
Sodium: 140 mmol/L (ref 135–145)
Total Bilirubin: 0.6 mg/dL (ref 0.3–1.2)
Total Protein: 7.9 g/dL (ref 6.5–8.1)

## 2022-08-01 LAB — TROPONIN I (HIGH SENSITIVITY): Troponin I (High Sensitivity): 5 ng/L (ref <18)

## 2022-08-01 NOTE — ED Triage Notes (Signed)
To triage via wheelchair with c/o headache. Dizziness, shortness of breath and chest pain. Hx of COPD. Wears 02 at 3L at home.  Has used neb tx at home with no relief in sx.

## 2022-08-02 ENCOUNTER — Other Ambulatory Visit: Payer: Self-pay

## 2022-08-02 ENCOUNTER — Emergency Department
Admission: EM | Admit: 2022-08-02 | Discharge: 2022-08-02 | Disposition: A | Discharge status: Home / Self Care | Payer: Medicare Other | Attending: Emergency Medicine | Admitting: Emergency Medicine

## 2022-08-02 DIAGNOSIS — J441 Chronic obstructive pulmonary disease with (acute) exacerbation: Secondary | ICD-10-CM

## 2022-08-02 LAB — TROPONIN I (HIGH SENSITIVITY): Troponin I (High Sensitivity): 4 ng/L (ref <18)

## 2022-08-02 MED ORDER — BUTALBITAL-APAP-CAFFEINE 50-325-40 MG PO TABS
2.0000 | ORAL_TABLET | Freq: Once | ORAL | Status: AC
  Administered 2022-08-02: 2 via ORAL
  Filled 2022-08-02: qty 2

## 2022-08-02 MED ORDER — PREDNISONE 10 MG (21) PO TBPK: ORAL_TABLET | ORAL | 0 refills | Status: AC

## 2022-08-02 MED ORDER — IPRATROPIUM-ALBUTEROL 0.5-2.5 (3) MG/3ML IN SOLN
6.0000 mL | Freq: Once | RESPIRATORY_TRACT | Status: AC
  Administered 2022-08-02: 6 mL via RESPIRATORY_TRACT
  Filled 2022-08-02: qty 3

## 2022-08-02 MED ORDER — BUTALBITAL-APAP-CAFFEINE 50-325-40 MG PO TABS: 1.0000 | ORAL_TABLET | Freq: Four times a day (QID) | ORAL | 0 refills | Status: DC | PRN

## 2022-08-02 MED ORDER — METHYLPREDNISOLONE SODIUM SUCC 125 MG IJ SOLR
125.0000 mg | Freq: Once | INTRAMUSCULAR | Status: AC
  Administered 2022-08-02: 125 mg via INTRAVENOUS
  Filled 2022-08-02: qty 2

## 2022-08-02 MED ORDER — POTASSIUM CHLORIDE CRYS ER 20 MEQ PO TBCR
40.0000 meq | EXTENDED_RELEASE_TABLET | Freq: Once | ORAL | Status: AC
  Administered 2022-08-02: 40 meq via ORAL
  Filled 2022-08-02: qty 2

## 2022-08-02 NOTE — ED Provider Notes (Signed)
[facility] Provider Note    None    (approximate)   History   Shortness of Breath   HPI  Jonathan Willis is a 77 y.o. male who presents to the ED for evaluation of Shortness of Breath   I reviewed pulmonary clinic visit from 2/29.  History of COPD, bronchoscopy with evidence of chronic granulomatous disease  Daughter brings patient to the ED for evaluation of nonproductive cough and increasing shortness of breath in the past 1 day.  No increased production of mucus, fevers.  Some chest discomfort that they relate to the coughing  Physical Exam   Triage Vital Signs: ED Triage Vitals  Enc Vitals Group     BP 08/01/22 2244 (!) 163/92     Pulse Rate 08/01/22 2244 89     Resp 08/01/22 2244 18     Temp 08/01/22 2244 97.7 F (36.5 C)     Temp Source 08/01/22 2244 Oral     SpO2 08/01/22 2244 98 %     Weight 08/01/22 2245 110 lb (49.9 kg)     Height 08/01/22 2245 5\' 1"  (1.549 m)     Head Circumference --      Peak Flow --      Pain Score 08/01/22 2245 8     Pain Loc --      Pain Edu? --      Excl. in GC? --     Most recent vital signs: Vitals:   08/02/22 0330 08/02/22 0400  BP: 119/75 (!) 140/77  Pulse: 73 73  Resp:    Temp:    SpO2: 99% 99%    General: Awake, no distress.  CV:  Good peripheral perfusion.  Resp:  Wheezing throughout, tachypneic to the mid 20s.  No distress Abd:  No distention.  MSK:  No deformity noted.  Neuro:  No focal deficits appreciated. Other:     ED Results / Procedures / Treatments   Labs (all labs ordered are listed, but only abnormal results are displayed) Labs Reviewed  COMPREHENSIVE METABOLIC PANEL - Abnormal; Notable for the following components:      Result Value   Potassium 3.1 (*)    Glucose, Bld 115 (*)    All other components within normal limits  CBC WITH DIFFERENTIAL/PLATELET  TROPONIN I (HIGH SENSITIVITY)  TROPONIN I (HIGH SENSITIVITY)    EKG Sinus rhythm with a rate of 67 bpm.  Normal axis  and intervals.  No clear signs of acute ischemia.  RADIOLOGY CXR interpreted by me without evidence of acute cardiopulmonary pathology.  Official radiology report(s): DG Chest Portable 1 View  Result Date: 08/01/2022 CLINICAL DATA:  Headache. EXAM: PORTABLE CHEST 1 VIEW COMPARISON:  July 18, 2022 FINDINGS: The heart size and mediastinal contours are within normal limits. Stable right upper lobe scarring and volume loss is seen with a stable right upper lobe calcified lung nodule. Low lung volumes are seen. There is no evidence of acute infiltrate, pleural effusion or pneumothorax. The visualized skeletal structures are unremarkable. IMPRESSION: 1. Stable right upper lobe scarring and volume loss. 2. No acute or active cardiopulmonary disease. Electronically Signed   By: Aram Candela M.D.   On: 08/01/2022 23:14    PROCEDURES and INTERVENTIONS:  .1-3 Lead EKG Interpretation  Performed by: Delton Prairie, MD Authorized by: Delton Prairie, MD     Interpretation: normal     ECG rate:  70   ECG rate assessment: normal     Rhythm: sinus rhythm  Ectopy: none     Conduction: normal     Medications  butalbital-acetaminophen-caffeine (FIORICET) 50-325-40 MG per tablet 2 tablet (has no administration in time range)  potassium chloride SA (KLOR-CON M) CR tablet 40 mEq (40 mEq Oral Given 08/02/22 0258)  methylPREDNISolone sodium succinate (SOLU-MEDROL) 125 mg/2 mL injection 125 mg (125 mg Intravenous Given 08/02/22 0307)  ipratropium-albuterol (DUONEB) 0.5-2.5 (3) MG/3ML nebulizer solution 6 mL (6 mLs Nebulization Given 08/02/22 0259)     IMPRESSION / MDM / ASSESSMENT AND PLAN / ED COURSE  I reviewed the triage vital signs and the nursing notes.  Differential diagnosis includes, but is not limited to, COPD exacerbation, pneumothorax, ACS, pneumonia  {Patient presents with symptoms of an acute illness or injury that is potentially life-threatening.  77 year old with COPD presents to the ED with  evidence of an exacerbation suitable for outpatient management with steroids.  Wheezing and clear stigmata of COPD exacerbation on exam, but no distress or indications for BiPAP.  No hypoxia or instability.  CXR is clear.  He has 2 negative troponins and a normal CBC.  Hypokalemia is replaced orally.  I considered observation admission for this patient, but he improved so well with a single round of breathing treatments and I believe is suitable for trial of outpatient management.  Clinical Course as of 08/02/22 0438  Tue Aug 02, 2022  0432 Reassessed.  Patient reports feeling much better.  No longer wheezing on auscultation. [DS]    Clinical Course User Index [DS] Delton Prairie, MD     FINAL CLINICAL IMPRESSION(S) / ED DIAGNOSES   Final diagnoses:  COPD exacerbation (HCC)     Rx / DC Orders   ED Discharge Orders          Ordered    butalbital-acetaminophen-caffeine (FIORICET) 50-325-40 MG tablet  Every 6 hours PRN        08/02/22 0433    predniSONE (STERAPRED UNI-PAK 21 TAB) 10 MG (21) TBPK tablet  Daily        08/02/22 0434             Note:  This document was prepared using Dragon voice recognition software and may include unintentional dictation errors.   Delton Prairie, MD 08/02/22 [phone]

## 2022-08-02 NOTE — Discharge Instructions (Signed)
I sent a prescription for prednisone steroids to start a 2-week taper  Sent a prescription for Fioricet headache/dizzy medications to use as needed  Follow-up with Dr. Mervyn Skeeters, and to the ED with any worsening symptoms  Continue all other home/normal prescription medications

## 2022-08-02 NOTE — ED Notes (Signed)
Pt NAD.  Lungs CTA, VSS family in at bedside with pt

## 2022-08-23 ENCOUNTER — Other Ambulatory Visit: Payer: Self-pay | Admitting: Internal Medicine

## 2022-08-24 NOTE — Telephone Encounter (Signed)
Requested Prescriptions  Pending Prescriptions Disp Refills   rosuvastatin (CRESTOR) 10 MG tablet [Pharmacy Med Name: ROSUVASTATIN CALCIUM 10 MG TAB] 90 tablet 0    Sig: TAKE 1 TABLET BY MOUTH EVERY DAY     Cardiovascular:  Antilipid - Statins 2 Failed - 08/23/2022  2:05 PM      Failed - Lipid Panel in normal range within the last 12 months    Cholesterol  Date Value Ref Range Status  02/22/2022 276 (H) <200 mg/dL Final   LDL Cholesterol (Calc)  Date Value Ref Range Status  02/22/2022 170 (H) mg/dL (calc) Final    Comment:    Reference range: <100 . Desirable range <100 mg/dL for primary prevention;   <70 mg/dL for patients with CHD or diabetic patients  with > or = 2 CHD risk factors. Marland Kitchen LDL-C is now calculated using the Martin-Hopkins  calculation, which is a validated novel method providing  better accuracy than the Friedewald equation in the  estimation of LDL-C.  Horald Pollen et al. Lenox Ahr. 1610;960(45): 2061-2068  (http://education.[facility].com/faq/FAQ164)    HDL  Date Value Ref Range Status  02/22/2022 48 > OR = 40 mg/dL Final   Triglycerides  Date Value Ref Range Status  02/22/2022 340 (H) <150 mg/dL Final    Comment:    . If a non-fasting specimen was collected, consider repeat triglyceride testing on a fasting specimen if clinically indicated.  Perry Mount et al. J. of Clin. Lipidol. 2015;9:129-169. Marland Kitchen          Passed - Cr in normal range and within 360 days    Creat  Date Value Ref Range Status  02/22/2022 0.95 0.70 - 1.28 mg/dL Final   Creatinine, Ser  Date Value Ref Range Status  08/01/2022 0.77 0.61 - 1.24 mg/dL Final   Creatinine, Urine  Date Value Ref Range Status  02/22/2022 58 20 - 320 mg/dL Final         Passed - Patient is not pregnant      Passed - Valid encounter within last 12 months    Recent Outpatient Visits           1 month ago Vertigo   [facility] [place], [place] W, NP   4 months ago Sepsis, due  to unspecified organism, unspecified whether acute organ dysfunction present [facility])   [facility] [place], [place], NP   6 months ago Paresthesia of right upper extremity   [facility] [place], [place] W, NP   1 year ago Type 2 diabetes mellitus with other specified complication, without long-term current use of insulin [facility])   [facility] [place], Salvadore Oxford, NP   2 years ago COPD exacerbation [facility])   [facility] [place], [place], [place]

## 2022-08-30 DIAGNOSIS — R413 Other amnesia: Secondary | ICD-10-CM | POA: Diagnosis not present

## 2022-08-30 DIAGNOSIS — J8410 Pulmonary fibrosis, unspecified: Secondary | ICD-10-CM | POA: Diagnosis not present

## 2022-09-03 ENCOUNTER — Emergency Department: Payer: Medicare Other

## 2022-09-03 ENCOUNTER — Other Ambulatory Visit: Payer: Self-pay

## 2022-09-03 ENCOUNTER — Encounter: Payer: Self-pay | Admitting: Emergency Medicine

## 2022-09-03 DIAGNOSIS — J441 Chronic obstructive pulmonary disease with (acute) exacerbation: Secondary | ICD-10-CM | POA: Insufficient documentation

## 2022-09-03 DIAGNOSIS — R0602 Shortness of breath: Secondary | ICD-10-CM | POA: Diagnosis not present

## 2022-09-03 DIAGNOSIS — Z1152 Encounter for screening for COVID-19: Secondary | ICD-10-CM | POA: Diagnosis not present

## 2022-09-03 DIAGNOSIS — R0902 Hypoxemia: Secondary | ICD-10-CM | POA: Diagnosis not present

## 2022-09-03 DIAGNOSIS — I10 Essential (primary) hypertension: Secondary | ICD-10-CM | POA: Diagnosis not present

## 2022-09-03 LAB — CBC
HCT: 43.4 % (ref 39.0–52.0)
Hemoglobin: 14.5 g/dL (ref 13.0–17.0)
MCH: 31.7 pg (ref 26.0–34.0)
MCHC: 33.4 g/dL (ref 30.0–36.0)
MCV: 94.8 fL (ref 80.0–100.0)
Platelets: 218 10*3/uL (ref 150–400)
RBC: 4.58 MIL/uL (ref 4.22–5.81)
RDW: 12.4 % (ref 11.5–15.5)
WBC: 5.9 10*3/uL (ref 4.0–10.5)
nRBC: 0 % (ref 0.0–0.2)

## 2022-09-03 LAB — BASIC METABOLIC PANEL
Anion gap: 9 (ref 5–15)
BUN: 17 mg/dL (ref 8–23)
CO2: 25 mmol/L (ref 22–32)
Calcium: 8.7 mg/dL — ABNORMAL LOW (ref 8.9–10.3)
Chloride: 104 mmol/L (ref 98–111)
Creatinine, Ser: 0.81 mg/dL (ref 0.61–1.24)
GFR, Estimated: >60 mL/min (ref >60)
Glucose, Bld: 118 mg/dL — ABNORMAL HIGH (ref 70–99)
Potassium: 3.8 mmol/L (ref 3.5–5.1)
Sodium: 138 mmol/L (ref 135–145)

## 2022-09-03 LAB — TROPONIN I (HIGH SENSITIVITY): Troponin I (High Sensitivity): 4 ng/L (ref <18)

## 2022-09-03 LAB — SARS CORONAVIRUS 2 BY RT PCR: SARS Coronavirus 2 by RT PCR: NEGATIVE

## 2022-09-03 NOTE — ED Triage Notes (Signed)
Pt presents ambulatory to triage via POV with complaints of SOB x 1 week. Hx of COPD. Pt with RA sat of 88% with a dry cough. Per family, pt is to be wearing 3L [place] as needed, 3L [place] applied in triage and sats improved to 98%. A&Ox4 at this time. Denies CP.

## 2022-09-04 ENCOUNTER — Emergency Department
Admission: EM | Admit: 2022-09-04 | Discharge: 2022-09-04 | Disposition: A | Discharge status: Home / Self Care | Payer: Medicare Other | Attending: Emergency Medicine | Admitting: Emergency Medicine

## 2022-09-04 DIAGNOSIS — J441 Chronic obstructive pulmonary disease with (acute) exacerbation: Secondary | ICD-10-CM | POA: Diagnosis not present

## 2022-09-04 DIAGNOSIS — R0602 Shortness of breath: Secondary | ICD-10-CM

## 2022-09-04 DIAGNOSIS — R0902 Hypoxemia: Secondary | ICD-10-CM

## 2022-09-04 LAB — TROPONIN I (HIGH SENSITIVITY): Troponin I (High Sensitivity): 3 ng/L (ref <18)

## 2022-09-04 MED ORDER — PREDNISONE 10 MG (21) PO TBPK: ORAL_TABLET | ORAL | 0 refills | Status: AC

## 2022-09-04 MED ORDER — ALBUTEROL SULFATE HFA 108 (90 BASE) MCG/ACT IN AERS: 2.0000 | INHALATION_SPRAY | Freq: Four times a day (QID) | RESPIRATORY_TRACT | 2 refills | Status: DC | PRN

## 2022-09-04 MED ORDER — PREDNISONE 20 MG PO TABS
60.0000 mg | ORAL_TABLET | Freq: Once | ORAL | Status: AC
  Administered 2022-09-04: 60 mg via ORAL
  Filled 2022-09-04: qty 3

## 2022-09-04 MED ORDER — IPRATROPIUM-ALBUTEROL 0.5-2.5 (3) MG/3ML IN SOLN
6.0000 mL | Freq: Once | RESPIRATORY_TRACT | Status: AC
  Administered 2022-09-04: 6 mL via RESPIRATORY_TRACT
  Filled 2022-09-04: qty 3

## 2022-09-04 NOTE — ED Notes (Signed)
Patients O2 SATs stayed between 97- 100% while ambulating and HR 82. He denies dizziness or SOB .

## 2022-09-04 NOTE — ED Provider Notes (Signed)
[facility] Provider Note    Event Date/Time   First MD Initiated Contact with Patient 09/04/22 [phone]     (approximate)   History   Shortness of Breath   HPI  Jonathan Willis is a 77 y.o. male who presents to the ED for evaluation of Shortness of Breath   I reviewed pulmonary clinic visit from 5 days ago.  History of COPD and chronic granulomatous lung disease.  I saw this patient 1 month ago for the same and recall him after seeing him and daughter.   Patient presents with his daughter for evaluation of dyspnea and nonproductive cough for the past few days.  They report this is similar to previous episodes of COPD exacerbation.  They report that he might have a cold or susceptible to seasonal allergies.  No fevers or increased sputum production.  He does report some mild chest tightness that he relates to soreness from coughing.  No syncope or abdominal pain.   Physical Exam   Triage Vital Signs: ED Triage Vitals [09/03/22 2209]  Enc Vitals Group     BP 135/78     Pulse Rate 78     Resp (!) 22     Temp 98.3 F (36.8 C)     Temp src      SpO2 (!) 88 %     Weight 109 lb 5.6 oz (49.6 kg)     Height 5\' 1"  (1.549 m)     Head Circumference      Peak Flow      Pain Score 0     Pain Loc      Pain Edu?      Excl. in GC?     Most recent vital signs: Vitals:   09/04/22 0200 09/04/22 0215  BP:    Pulse: 75 82  Resp: 18 19  Temp:    SpO2: 100% 98%    General: Awake, no distress.  CV:  Good peripheral perfusion.  Resp:  Minimal tachypnea to the low 20s.  Diffuse wheezing and slightly decreased airflow throughout.  No focal features Abd:  No distention.  MSK:  No deformity noted.  Neuro:  No focal deficits appreciated. Other:     ED Results / Procedures / Treatments   Labs (all labs ordered are listed, but only abnormal results are displayed) Labs Reviewed  BASIC METABOLIC PANEL - Abnormal; Notable for the following components:      Result  Value   Glucose, Bld 118 (*)    Calcium 8.7 (*)    All other components within normal limits  SARS CORONAVIRUS 2 BY RT PCR  CBC  TROPONIN I (HIGH SENSITIVITY)  TROPONIN I (HIGH SENSITIVITY)    EKG Sinus rhythm with a rate of 77 bpm.  Normal axis and intervals.  No clear signs of acute ischemia.  RADIOLOGY CXR interpreted by me without evidence of acute cardiopulmonary pathology.  Official radiology report(s): DG Chest 2 View  Result Date: 09/03/2022 CLINICAL DATA:  Shortness of breath EXAM: CHEST - 2 VIEW COMPARISON:  08/01/2022 FINDINGS: Cardiac shadow is stable. Persistent right upper lobe scarring and volume loss. No new focal infiltrate is seen. No acute bony abnormality is noted. IMPRESSION: Chronic right upper lobe scarring and volume loss. Electronically Signed   By: Alcide Clever M.D.   On: 09/03/2022 22:42    PROCEDURES and INTERVENTIONS:  .1-3 Lead EKG Interpretation  Performed by: Delton Prairie, MD Authorized by: Delton Prairie, MD  Interpretation: normal     ECG rate:  80   ECG rate assessment: normal     Rhythm: sinus rhythm     Ectopy: none     Conduction: normal   .Critical Care  Performed by: Delton Prairie, MD Authorized by: Delton Prairie, MD   Critical care provider statement:    Critical care time (minutes):  30   Critical care time was exclusive of:  Separately billable procedures and treating other patients   Critical care was necessary to treat or prevent imminent or life-threatening deterioration of the following conditions:  Respiratory failure   Critical care was time spent personally by me on the following activities:  Development of treatment plan with patient or surrogate, discussions with consultants, evaluation of patient's response to treatment, examination of patient, ordering and review of laboratory studies, ordering and review of radiographic studies, ordering and performing treatments and interventions, pulse oximetry, re-evaluation of patient's  condition and review of old charts   Medications  ipratropium-albuterol (DUONEB) 0.5-2.5 (3) MG/3ML nebulizer solution 6 mL (6 mLs Nebulization Given 09/04/22 0114)  predniSONE (DELTASONE) tablet 60 mg (60 mg Oral Given 09/04/22 0114)     IMPRESSION / MDM / ASSESSMENT AND PLAN / ED COURSE  I reviewed the triage vital signs and the nursing notes.  Differential diagnosis includes, but is not limited to, ACS, PTX, PNA, muscle strain/spasm, PE, dissection, anxiety, pleural effusion, COPD exacerbation  {Patient presents with symptoms of an acute illness or injury that is potentially life-threatening  Patient presents with evidence of a COPD exacerbation suitable for outpatient management.  Initially hypoxic in triage but after treatments this normalizes and he is ambulatory without hypoxia.  His CXR is clear and has 2 negative troponins.  Essentially normal CBC and metabolic panel.  COVID testing is negative.  No evidence of ischemia on his EKG.  Doubt PE considering his improvement with COPD management.  I considered observation admission for this patient, but he improved so well that I believe outpatient management is reasonable.  Discussed pulm follow-up and return precautions  Clinical Course as of 09/04/22 0220  Sun Sep 04, 2022  0213 Reassessed.  Feeling well.  Clear lungs now on pulmonary auscultation.  Good airflow.  Asked nursing staff to ambulate with pulse ox [DS]    Clinical Course User Index [DS] Delton Prairie, MD     FINAL CLINICAL IMPRESSION(S) / ED DIAGNOSES   Final diagnoses:  COPD exacerbation (HCC)  Shortness of breath  Hypoxia     Rx / DC Orders   ED Discharge Orders          Ordered    predniSONE (STERAPRED UNI-PAK 21 TAB) 10 MG (21) TBPK tablet  Daily        09/04/22 0214    albuterol (VENTOLIN HFA) 108 (90 Base) MCG/ACT inhaler  Every 6 hours PRN        09/04/22 0214             Note:  This document was prepared using Dragon voice recognition software  and may include unintentional dictation errors.   Delton Prairie, MD 09/04/22 [phone]

## 2022-11-07 ENCOUNTER — Other Ambulatory Visit: Payer: Self-pay

## 2022-11-07 DIAGNOSIS — J431 Panlobular emphysema: Secondary | ICD-10-CM

## 2022-11-07 DIAGNOSIS — E1169 Type 2 diabetes mellitus with other specified complication: Secondary | ICD-10-CM

## 2022-11-18 ENCOUNTER — Ambulatory Visit: Payer: Medicare Other | Admitting: Internal Medicine

## 2022-11-18 NOTE — Progress Notes (Deleted)
Subjective:    Patient ID: Jonathan Willis, male    DOB: Jul 20, 1945, 77 y.o.   MRN: [id number]  HPI  Patient presents to clinic today for follow-up of chronic conditions.  HTN: His BP today is.  He is not taking any antihypertensive medication at this time.  ECG from 08/2022 reviewed.  HLD with carotid/aortic atherosclerosis: His last LDL was 170, triglycerides 340, 01/2022.  He denies myalgias on rosuvastatin.  He is not taking any aspirin.  He does not consume low-fat diet.  COPD with [facility]: He reports chronic cough and shortness of breath.  He is using Trelegy, albuterol and DuoNebs as prescribed.  There are no PFTs on file.  He follows with pulmonology.  DM2 : His last A1c was 6.9%, 01/2022.  He is taking metformin as prescribed.  He does not check his sugars.  He does not check his feet routinely.  His last eye exam was.  Flu 03/2017.  Pneumovax 03/2018.  Prevnar 12/2016.  COVID x 5.  BPH: He reports.  He is not currently taking any medications for this.  He does not follow with urology.  Chronic shoulder pain: He takes as needed with some relief of symptoms.  He does not follow with orthopedics.  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 8 g 1   albuterol (VENTOLIN HFA) 108 (90 Base) MCG/ACT inhaler Inhale 2 puffs into the  lungs every 6 (six) hours as needed for wheezing or shortness of breath. 8 g 2   Blood Glucose Monitoring Suppl (ACCU-CHEK AVIVA CONNECT) w/Device KIT 1 Device by Does not apply route daily. Dx E11.4 1 kit 0   butalbital-acetaminophen-caffeine (FIORICET) 50-325-40 MG tablet Take 1 tablet by mouth every 6 (six) hours as needed for headache. 14 tablet 0   butalbital-acetaminophen-caffeine (FIORICET) 50-325-40 MG tablet Take 1-2 tablets by mouth every 6 (six) hours as needed for headache. 20 tablet 0   Fluticasone-Umeclidin-Vilant (TRELEGY ELLIPTA) 100-62.5-25 MCG/INH AEPB Inhale 1 puff into the lungs daily. 30 each 5   guaiFENesin-dextromethorphan (ROBITUSSIN DM) 100-10 MG/5ML syrup Take 5 mLs by mouth every 4 (four) hours as needed for cough. 118 mL 0   ipratropium-albuterol (DUONEB) 0.5-2.5 (3) MG/3ML SOLN Take 3 mLs by nebulization.     meclizine (ANTIVERT) 25 MG tablet Take 1 tablet (25 mg total) by mouth 3 (three) times daily as needed for dizziness. 30 tablet 2   metFORMIN (GLUCOPHAGE) 500 MG tablet TAKE 1 TABLET BY MOUTH 2 TIMES DAILY WITH A MEAL. 180 tablet 0   OXYGEN Inhale into the lungs. 3 Liters     promethazine-dextromethorphan (PROMETHAZINE-DM) 6.25-15 MG/5ML syrup Take 5 mLs by mouth 4 (four) times daily as needed. 118 mL 0   rosuvastatin (CRESTOR) 10 MG tablet  TAKE 1 TABLET BY MOUTH EVERY DAY 90 tablet 1   sulfamethoxazole-trimethoprim (BACTRIM) 400-80 MG tablet Take 1 tablet by mouth 3 (three) times a week.     No current facility-administered medications for this visit.    No Known Allergies  Family History  Problem Relation Age of Onset   COPD Neg Hx    Diabetes Mellitus II Neg Hx    Hypertension Neg Hx     Social History   Socioeconomic History   Marital status: Divorced    Spouse name: Not on file   Number of children: Not on file   Years of education: 9   Highest education level: 9th grade  Occupational History   Occupation: retired  Tobacco Use   Smoking  status: Former    Current packs/day: 0.00    Average packs/day: 1.5 packs/day for 44.0 years (66.0 ttl pk-yrs)    Types: Cigarettes    Start date: 01/11/1963    Quit date: 01/11/2007    Years since quitting: 15.8   Smokeless tobacco: Former  Building services engineer status: Never Used  Substance and Sexual Activity   Alcohol use: Yes    Comment: very rarely   Drug use: Never   Sexual activity: Yes    Birth control/protection: None  Other Topics Concern   Not on file  Social History Narrative   Not on file   Social Determinants of Health   Financial Resource Strain: High Risk (03/04/2022)   Overall Financial Resource Strain (CARDIA)    Difficulty of Paying Living Expenses: Very hard  Food Insecurity: No Food Insecurity (04/24/2022)   Hunger Vital Sign    Worried About Running Out of Food in the Last Year: Never true    Ran Out of Food in the Last Year: Never true  Transportation Needs: No Transportation Needs (04/24/2022)   PRAPARE - Administrator, Civil Service (Medical): No    Lack of Transportation (Non-Medical): No  Physical Activity: Insufficiently Active (03/04/2022)   Exercise Vital Sign    Days of Exercise per Week: 4 days    Minutes of Exercise per Session: 30 min  Stress: No Stress Concern Present (03/04/2022)   [employer] of Occupational Health - Occupational Stress Questionnaire    Feeling of Stress : Not at all  Social Connections: Moderately Isolated (03/04/2022)   Social Connection and Isolation Panel [NHANES]    Frequency of Communication with Friends and Family: More than three times a week    Frequency of Social Gatherings with Friends and Family: More than three times a week    Attends Religious Services: 1 to 4 times per year    Active Member of [employer] or Organizations: No    Attends Banker Meetings: Never    Marital Status: Divorced  Catering manager Violence: Not At Risk (04/24/2022)   Humiliation, Afraid, Rape, and Kick  questionnaire    Fear of Current or Ex-Partner: No    Emotionally Abused: No    Physically Abused: No    Sexually Abused: No     Constitutional: Denies fever, malaise, fatigue, headache or abrupt weight changes.  HEENT: Denies eye pain, eye redness, ear pain, ringing in the ears, wax buildup, runny nose, nasal congestion, bloody nose, or sore throat. Respiratory: Patient reports chronic cough and shortness of breath.  Denies difficulty breathing, or sputum production.   Cardiovascular: Denies chest pain, chest tightness, palpitations or swelling in the hands or feet.  Gastrointestinal: Denies abdominal pain,  bloating, constipation, diarrhea or blood in the stool.  GU: Denies urgency, frequency, pain with urination, burning sensation, blood in urine, odor or discharge. Musculoskeletal: Patient reports chronic shoulder pain.  Denies decrease in range of motion, difficulty with gait, muscle pain or joint swelling.  Skin: Denies redness, rashes, lesions or ulcercations.  Neurological: Denies dizziness, difficulty with memory, difficulty with speech or problems with balance and coordination.  Psych: Denies anxiety, depression, SI/HI.  No other specific complaints in a complete review of systems (except as listed in HPI above).  Objective:   Physical Exam   There were no vitals taken for this visit. Wt Readings from Last 3 Encounters:  09/03/22 109 lb 5.6 oz (49.6 kg)  08/01/22 110 lb (49.9 kg)  07/19/22 110 lb 9.6 oz (50.2 kg)    General: Appears their stated age, well developed, well nourished in NAD. Skin: Warm, dry and intact. No rashes, lesions or ulcerations noted. HEENT: Head: normal shape and size; Eyes: sclera white, no icterus, conjunctiva pink, PERRLA and EOMs intact; Ears: Tm's gray and intact, normal light reflex; Nose: mucosa pink and moist, septum midline; Throat/Mouth: Teeth present, mucosa pink and moist, no exudate, lesions or ulcerations noted.  Neck:  Neck supple,  trachea midline. No masses, lumps or thyromegaly present.  Cardiovascular: Normal rate and rhythm. S1,S2 noted.  No murmur, rubs or gallops noted. No JVD or BLE edema. No carotid bruits noted. Pulmonary/Chest: Normal effort and positive vesicular breath sounds. No respiratory distress. No wheezes, rales or ronchi noted.  Abdomen: Soft and nontender. Normal bowel sounds. No distention or masses noted. Liver, spleen and kidneys non palpable. Musculoskeletal: Normal range of motion. No signs of joint swelling. No difficulty with gait.  Neurological: Alert and oriented. Cranial nerves II-XII grossly intact. Coordination normal.  Psychiatric: Mood and affect normal. Behavior is normal. Judgment and thought content normal.    BMET    Component Value Date/Time   NA 138 09/03/2022 2213   NA 136 02/04/2014 0616   K 3.8 09/03/2022 2213   K 4.1 02/04/2014 0616   CL 104 09/03/2022 2213   CL 102 02/04/2014 0616   CO2 25 09/03/2022 2213   CO2 25 02/04/2014 0616   GLUCOSE 118 (H) 09/03/2022 2213   GLUCOSE 225 (H) 02/04/2014 0616   BUN 17 09/03/2022 2213   BUN 18 02/04/2014 0616   CREATININE 0.81 09/03/2022 2213   CREATININE 0.95 02/22/2022 1353   CALCIUM 8.7 (L) 09/03/2022 2213   CALCIUM 9.1 02/04/2014 0616   GFRNONAA >60 09/03/2022 2213   GFRNONAA 73 10/16/2018 0928   GFRAA >60 06/06/2019 1049   GFRAA 85 10/16/2018 0928    Lipid Panel     Component Value Date/Time   CHOL 276 (H) 02/22/2022 1353   TRIG 340 (H) 02/22/2022 1353   HDL 48 02/22/2022 1353   CHOLHDL 5.8 (H) 02/22/2022 1353   LDLCALC 170 (H) 02/22/2022 1353    CBC    Component Value Date/Time   WBC 5.9 09/03/2022 2213   RBC 4.58 09/03/2022 2213   HGB 14.5 09/03/2022 2213   HGB 14.1 02/06/2014 0345   HCT 43.4 09/03/2022 2213   HCT 42.3 02/06/2014 0345   PLT 218 09/03/2022 2213   PLT 336 02/06/2014 0345   MCV 94.8 09/03/2022 2213   MCV 95 02/06/2014 0345   MCH 31.7 09/03/2022 2213   MCHC 33.4 09/03/2022 2213   RDW  12.4 09/03/2022 2213   RDW 13.0 02/06/2014 0345   LYMPHSABS 2.9 08/01/2022 2247  LYMPHSABS 1.7 02/06/2014 0345   MONOABS 0.5 08/01/2022 2247   MONOABS 0.5 02/06/2014 0345   EOSABS 0.1 08/01/2022 2247   EOSABS 0.0 02/06/2014 0345   BASOSABS 0.0 08/01/2022 2247   BASOSABS 0.0 02/06/2014 0345    Hgb A1C Lab Results  Component Value Date   HGBA1C 6.9 (H) 02/22/2022           Assessment & Plan:     RTC in 6 months, follow-up chronic conditions Nicki Reaper, NP

## 2022-11-22 ENCOUNTER — Encounter: Payer: Self-pay | Admitting: Internal Medicine

## 2022-11-22 ENCOUNTER — Ambulatory Visit (INDEPENDENT_AMBULATORY_CARE_PROVIDER_SITE_OTHER): Payer: Medicare Other | Admitting: Internal Medicine

## 2022-11-22 VITALS — BP 114/58 | HR 95 | Wt 109.0 lb

## 2022-11-22 DIAGNOSIS — J431 Panlobular emphysema: Secondary | ICD-10-CM | POA: Diagnosis not present

## 2022-11-22 DIAGNOSIS — E1169 Type 2 diabetes mellitus with other specified complication: Secondary | ICD-10-CM | POA: Diagnosis not present

## 2022-11-22 DIAGNOSIS — N400 Enlarged prostate without lower urinary tract symptoms: Secondary | ICD-10-CM

## 2022-11-22 DIAGNOSIS — E119 Type 2 diabetes mellitus without complications: Secondary | ICD-10-CM | POA: Diagnosis not present

## 2022-11-22 DIAGNOSIS — E785 Hyperlipidemia, unspecified: Secondary | ICD-10-CM

## 2022-11-22 DIAGNOSIS — J9611 Chronic respiratory failure with hypoxia: Secondary | ICD-10-CM

## 2022-11-22 DIAGNOSIS — I6523 Occlusion and stenosis of bilateral carotid arteries: Secondary | ICD-10-CM | POA: Diagnosis not present

## 2022-11-22 DIAGNOSIS — I10 Essential (primary) hypertension: Secondary | ICD-10-CM | POA: Diagnosis not present

## 2022-11-22 DIAGNOSIS — Z23 Encounter for immunization: Secondary | ICD-10-CM | POA: Diagnosis not present

## 2022-11-22 DIAGNOSIS — I700 Atherosclerosis of aorta: Secondary | ICD-10-CM

## 2022-11-22 LAB — POCT GLYCOSYLATED HEMOGLOBIN (HGB A1C): Hemoglobin A1C: 7.9 % — AB (ref 4.0–5.6)

## 2022-11-22 MED ORDER — ASPIRIN 81 MG PO TBEC: 81.0000 mg | DELAYED_RELEASE_TABLET | Freq: Every day | ORAL | Status: AC

## 2022-11-22 MED ORDER — IPRATROPIUM-ALBUTEROL 0.5-2.5 (3) MG/3ML IN SOLN: 3.0000 mL | RESPIRATORY_TRACT | 1 refills | Status: DC | PRN

## 2022-11-22 MED ORDER — ROSUVASTATIN CALCIUM 10 MG PO TABS: 10.0000 mg | ORAL_TABLET | Freq: Every day | ORAL | 1 refills | Status: DC

## 2022-11-22 MED ORDER — ALBUTEROL SULFATE HFA 108 (90 BASE) MCG/ACT IN AERS
2.0000 | INHALATION_SPRAY | Freq: Four times a day (QID) | RESPIRATORY_TRACT | 1 refills | Status: AC | PRN
Start: 2022-11-22 — End: ?

## 2022-11-22 MED ORDER — METFORMIN HCL 500 MG PO TABS
500.0000 mg | ORAL_TABLET | Freq: Two times a day (BID) | ORAL | 1 refills | Status: DC
Start: 2022-11-22 — End: 2023-05-23

## 2022-11-22 MED ORDER — TRELEGY ELLIPTA 100-62.5-25 MCG/ACT IN AEPB: 1.0000 | INHALATION_SPRAY | Freq: Every day | RESPIRATORY_TRACT | 5 refills | Status: AC

## 2022-11-22 NOTE — Assessment & Plan Note (Signed)
Wears oxygen as needed

## 2022-11-22 NOTE — Assessment & Plan Note (Signed)
POCT A1c 7.9% We will check urine microalbumin Encouraged him to consume a low-carb diet We will have him restart metformin Encouraged to eye exam Encouraged foot exam

## 2022-11-22 NOTE — Assessment & Plan Note (Signed)
Advised him to restart Trelegy and albuterol, refilled today

## 2022-11-22 NOTE — Assessment & Plan Note (Signed)
Asymptomatic off meds

## 2022-11-22 NOTE — Assessment & Plan Note (Signed)
Controlled without medications Reinforced DASH diet

## 2022-11-22 NOTE — Addendum Note (Signed)
Addended by: Kavin Leech E on: 11/22/2022 04:09 PM   Modules accepted: Orders

## 2022-11-22 NOTE — Assessment & Plan Note (Signed)
C-Met and lipid profile today Encouraged him to consume a low-fat diet Rosuvastatin refilled today Advised him to start taking a baby aspirin

## 2022-11-22 NOTE — Progress Notes (Signed)
Subjective:    Patient ID: Jonathan Willis, male    DOB: 1945-10-17, 77 y.o.   MRN: [id number]  HPI  Patient presents to clinic today for follow-up of chronic conditions.  HLD with carotid/aortic atherosclerosis: His last LDL was 170, triglycerides 340, 01/2022.  He is not taking rosuvastatin as prescribed.  He is not currently taking any aspirin.  He does not consume a low-fat diet.  COPD with CHRF: He reports chronic cough and shortness of breath.  He is not using trelegy and albuterol as prescribed.  There are no PFTs on file.  He follows with pulmonology.  HTN: His BP today is 114/58.  He is not taking any antihypertensive medications at this time.  ECG from 08/2022 reviewed.  DM2: His last A1c was 6.9%, 01/2022.  He is not taking metformin as prescribed.  He does not check his sugars.  He does not check his feet routinely.  His last eye exam was >1-year ago.  Flu 11/2017.  Pneumovax 03/2018.  Prevnar 12/2016.  COVID x 5.  BPH: He denies any symptoms at this time.  He is not currently taking medications for this.  He does not follow with urology.   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 8 g 1   albuterol (VENTOLIN HFA) 108 (90 Base) MCG/ACT inhaler Inhale 2 puffs into the lungs every 6 (six) hours as needed for wheezing or  shortness of breath. 8 g 2   Blood Glucose Monitoring Suppl (ACCU-CHEK AVIVA CONNECT) w/Device KIT 1 Device by Does not apply route daily. Dx E11.4 1 kit 0   butalbital-acetaminophen-caffeine (FIORICET) 50-325-40 MG tablet Take 1 tablet by mouth every 6 (six) hours as needed for headache. 14 tablet 0   butalbital-acetaminophen-caffeine (FIORICET) 50-325-40 MG tablet Take 1-2 tablets by mouth every 6 (six) hours as needed for headache. 20 tablet 0   Fluticasone-Umeclidin-Vilant (TRELEGY ELLIPTA) 100-62.5-25 MCG/INH AEPB Inhale 1 puff into the lungs daily. 30 each 5   guaiFENesin-dextromethorphan (ROBITUSSIN DM) 100-10 MG/5ML syrup Take 5 mLs by mouth every 4 (four) hours as needed for cough. 118 mL 0   ipratropium-albuterol (DUONEB) 0.5-2.5 (3) MG/3ML SOLN Take 3 mLs by nebulization.     meclizine (ANTIVERT) 25 MG tablet Take 1 tablet (25 mg total) by mouth 3 (three) times daily as needed for dizziness. 30 tablet 2   metFORMIN (GLUCOPHAGE) 500 MG tablet TAKE 1 TABLET BY MOUTH 2 TIMES DAILY WITH A MEAL. 180 tablet 0   OXYGEN Inhale into the lungs. 3 Liters     promethazine-dextromethorphan (PROMETHAZINE-DM) 6.25-15 MG/5ML syrup Take 5 mLs by mouth 4 (four) times daily as needed. 118 mL 0   rosuvastatin (CRESTOR) 10 MG tablet TAKE 1 TABLET BY MOUTH EVERY DAY 90  tablet 1   sulfamethoxazole-trimethoprim (BACTRIM) 400-80 MG tablet Take 1 tablet by mouth 3 (three) times a week.     No current facility-administered medications for this visit.    No Known Allergies  Family History  Problem Relation Age of Onset   COPD Neg Hx    Diabetes Mellitus II Neg Hx    Hypertension Neg Hx     Social History   Socioeconomic History   Marital status: Divorced    Spouse name: Not on file   Number of children: Not on file   Years of education: 9   Highest education level: 9th grade  Occupational History   Occupation: retired  Tobacco Use   Smoking status: Former    Current packs/day: 0.00    Average  packs/day: 1.5 packs/day for 44.0 years (66.0 ttl pk-yrs)    Types: Cigarettes    Start date: 01/11/1963    Quit date: 01/11/2007    Years since quitting: 15.8   Smokeless tobacco: Former  Building services engineer status: Never Used  Substance and Sexual Activity   Alcohol use: Yes    Comment: very rarely   Drug use: Never   Sexual activity: Yes    Birth control/protection: None  Other Topics Concern   Not on file  Social History Narrative   Not on file   Social Determinants of Health   Financial Resource Strain: High Risk (03/04/2022)   Overall Financial Resource Strain (CARDIA)    Difficulty of Paying Living Expenses: Very hard  Food Insecurity: No Food Insecurity (04/24/2022)   Hunger Vital Sign    Worried About Running Out of Food in the Last Year: Never true    Ran Out of Food in the Last Year: Never true  Transportation Needs: No Transportation Needs (04/24/2022)   PRAPARE - Administrator, Civil Service (Medical): No    Lack of Transportation (Non-Medical): No  Physical Activity: Insufficiently Active (03/04/2022)   Exercise Vital Sign    Days of Exercise per Week: 4 days    Minutes of Exercise per Session: 30 min  Stress: No Stress Concern Present (03/04/2022)   [employer] of Occupational Health - Occupational Stress Questionnaire    Feeling of Stress : Not at all  Social Connections: Moderately Isolated (03/04/2022)   Social Connection and Isolation Panel [NHANES]    Frequency of Communication with Friends and Family: More than three times a week    Frequency of Social Gatherings with Friends and Family: More than three times a week    Attends Religious Services: 1 to 4 times per year    Active Member of [employer] or Organizations: No    Attends Banker Meetings: Never    Marital Status: Divorced  Catering manager Violence: Not At Risk (04/24/2022)   Humiliation, Afraid, Rape, and Kick questionnaire    Fear of Current or Ex-Partner: No     Emotionally Abused: No    Physically Abused: No    Sexually Abused: No     Constitutional: Denies fever, malaise, fatigue, headache or abrupt weight changes.  HEENT: Denies eye pain, eye redness, ear pain, ringing in the ears, wax buildup, runny nose, nasal congestion, bloody nose, or sore throat. Respiratory: Patient reports chronic cough and shortness of breath.  Denies difficulty breathing, or sputum production.   Cardiovascular: Denies chest pain, chest tightness, palpitations or swelling in the hands or feet.  Gastrointestinal: Denies abdominal pain, bloating, constipation, diarrhea or blood in the stool.  GU: Denies urgency, frequency, pain with urination, burning sensation, blood in urine, odor or discharge. Musculoskeletal: Denies decrease in range of motion, difficulty with gait, muscle pain or joint pain or swelling.  Skin: Denies redness, rashes, lesions or ulcercations.  Neurological: Denies dizziness, difficulty with memory, difficulty with speech or problems with balance and coordination.  Psych: Denies anxiety, depression, SI/HI.  No other specific complaints in a complete review of systems (except as listed in HPI above).  Objective:   Physical Exam  BP (!) 114/58 (BP Location: Left Arm, Patient Position: Sitting, Cuff Size: Small)   Pulse 95   Wt 109 lb (49.4 kg)   SpO2 97%   BMI 20.60 kg/m   Wt Readings from Last 3 Encounters:  09/03/22 109 lb 5.6 oz (49.6 kg)  08/01/22 110 lb (49.9 kg)  07/19/22 110 lb 9.6 oz (50.2 kg)    General: Appears his stated age, well developed, well nourished in NAD. Skin: Warm, dry and intact. No ulcerations noted. HEENT: Head: normal shape and size; Eyes: sclera white, no icterus, conjunctiva pink, PERRLA and EOMs intact;  Cardiovascular: Normal rate and rhythm. S1,S2 noted.  No murmur, rubs or gallops noted. No JVD or BLE edema. No carotid bruits noted. Pulmonary/Chest: Normal effort and positive vesicular breath sounds with  bilateral inspiratory wheezing. No respiratory distress. No rales or ronchi noted.  Abdomen: Soft and nontender. Normal bowel sounds. No distention or masses noted. Liver, spleen and kidneys non palpable. Musculoskeletal: No difficulty with gait.  Neurological: Alert and oriented. Coordination normal.  Psychiatric: Mood and affect normal. Behavior is normal. Judgment and thought content normal.     BMET    Component Value Date/Time   NA 138 09/03/2022 2213   NA 136 02/04/2014 0616   K 3.8 09/03/2022 2213   K 4.1 02/04/2014 0616   CL 104 09/03/2022 2213   CL 102 02/04/2014 0616   CO2 25 09/03/2022 2213   CO2 25 02/04/2014 0616   GLUCOSE 118 (H) 09/03/2022 2213   GLUCOSE 225 (H) 02/04/2014 0616   BUN 17 09/03/2022 2213   BUN 18 02/04/2014 0616   CREATININE 0.81 09/03/2022 2213   CREATININE 0.95 02/22/2022 1353   CALCIUM 8.7 (L) 09/03/2022 2213   CALCIUM 9.1 02/04/2014 0616   GFRNONAA >60 09/03/2022 2213   GFRNONAA 73 10/16/2018 0928   GFRAA >60 06/06/2019 1049   GFRAA 85 10/16/2018 0928    Lipid Panel     Component Value Date/Time   CHOL 276 (H) 02/22/2022 1353   TRIG 340 (H) 02/22/2022 1353   HDL 48 02/22/2022 1353   CHOLHDL 5.8 (H) 02/22/2022 1353   LDLCALC 170 (H) 02/22/2022 1353    CBC    Component Value Date/Time   WBC 5.9 09/03/2022 2213   RBC 4.58 09/03/2022 2213   HGB 14.5 09/03/2022 2213   HGB 14.1 02/06/2014 0345   HCT 43.4 09/03/2022 2213   HCT 42.3 02/06/2014 0345   PLT 218 09/03/2022 2213   PLT 336 02/06/2014 0345   MCV 94.8 09/03/2022 2213   MCV 95 02/06/2014 0345   MCH 31.7 09/03/2022 2213   MCHC 33.4 09/03/2022 2213   RDW 12.4 09/03/2022 2213   RDW 13.0 02/06/2014 0345   LYMPHSABS 2.9 08/01/2022 2247   LYMPHSABS 1.7 02/06/2014 0345   MONOABS 0.5 08/01/2022 2247   MONOABS 0.5 02/06/2014 0345   EOSABS 0.1 08/01/2022 2247   EOSABS 0.0 02/06/2014 0345   BASOSABS 0.0 08/01/2022 2247   BASOSABS 0.0 02/06/2014 0345  Hgb A1C Lab Results   Component Value Date   HGBA1C 6.9 (H) 02/22/2022           Assessment & Plan:     RTC in 6 months, follow-up chronic conditions Nicki Reaper, NP

## 2022-11-22 NOTE — Assessment & Plan Note (Signed)
C-Met and profile today Encouraged to consume a low-fat diet Rosuvastatin refilled today, advised him to restart this

## 2022-11-22 NOTE — Patient Instructions (Signed)
Ti?nh l???ng carbohydrate trong b?nh ti?u ???ng, Ng??i l?n Carbohydrate Counting for Diabetes Mellitus, Adult Ti?nh l???ng carbohydrate l m?t ph??ng php theo di Jonathan carbohydrate m quy? vi? ?n. ?n carbohydrate lm t?ng Jonathan ???ng (glucose) trong mu. Vi?c tnh Jonathan carbohydrate qu v? ?n gip c?i thi?n kh? n?ng ki?m sot ???ng huy?t c?a qu v?. Nh? v?y, ?i?u ny gip qu v? qu?n l b?nh ti?u ???ng c?a mnh. Carbohydrate ???c ?o theo s? gram (g) trn m?i kh?u ph?n. Vi?c bi?t ???c Jonathan carbohydrate (theo gram ho?c theo kch c? kh?u ph?n) m qu v? c th? ?n trong m?i b?a ?n c vai tr quan tr?ng. Jonathan carbohydrate ny khc nhau v?i m?i ng??i. Chuyn gia v? ch? ?? ?n c th? gip qu v? l?p k? ho?ch cho b?a ?n v tnh Jonathan carbohydrate qu v? nn ?n vo m?i b?a ?n chnh v b?a ?n nh?. Nh?ng lo?i th?c ph?m no c ch?a carbohydrate? Carbohydrate co? trong cc lo?i th?c ph?m sau ?y: Ng? c?c, nh? l bnh m v cc lo?i ha?t ng? c?c. ??u kh v cc s?n ph?m ??u nnh. Jonathan Willis c tinh b?t nh? khoai ty, ??u H Lan v ng. Tri cy v n??c tri cy. S?a v s?a chua. K?o v ?? ?n nhe?, ch?ng h?n nh? bnh ng?t, bnh quy, k?o, khoai ty chin v n??c ng?t. Ti tnh Jonathan carbohydrate trong th?c ?n nh? th? no? C hai cch tnh l???ng carbohydrate trong th?c ?n. Qu v? c th? ??c nhn th?c ph?m ho?c tm hi?u kh?u ph?n ?n tiu chu?n c?a th?c ph?m. Quy? vi? c th? s? d?ng m?t trong hai ph??ng php ny ho?c k?t h?p c? hai. S? d?ng nhn Thng tin [name] Danh sch Thng tin [name] c trn nhn c?a h?u nh? t?t c? ca?c loa?i th?c ph?m va? ?? u?ng ?o?ng go?i t?i [place] Jonathan Willis g?m: Kch c? kh?u ph?n. Thng tin v? cc ch?t Zenith d??ng trong m?i kh?u ph?n, g?m c? s? gram carbohydrate trong m?i kh?u ph?n. ?? s? d?ng Thng tin [place] d??ng, hy quy?t ??nh s? kh?u ph?n m qu v? s? ?n. Sau ?, nhn s? kh?u ph?n v??i Jonathan carbohydrate trong m?i kh?u ph?n. K?t qu? l t?ng s? gram carbohydrate m qu v? s?  ?n. Ti?m hi?u v? kch c? kh?u ph?n tiu chu?n c?a cc lo?i th?c ph?m Khi ?n th?c ?n c carbohydrate khng ?ng gi ho?c khng co? Thng tin [name] trn nhn, quy? vi? c?n ?o s? kh?u ph?n ?? ti?nh s? gram carbohydrate. ?o Jonathan th?c ?n qu v? s? ?n b?ng cn th?c ph?m ho?c c?c ?ong, n?u c?n. Quy?t ??nh s? kh?u ph?n theo kch th??c tiu chu?n qu v? s? ?n. Nhn s? kh?u ph?n ny v?i 15. ??i v?i nh?ng th?c ph?m ch?a carbohydrate, m?t kh?u ph?n t??ng ???ng v?i 15 g carbohydrate. V d?, n?u quy? vi? ?n 2 c?c ho?c 10 ao-x? (300 g) du ty, quy? vi? s? ph?i ?n 2 kh?u ph?n v 30 g carbohydrate (2 kh?u ph?n x 15 g = 30 g). ??i v?i nh??ng th?c ?n tr?n nhi?u lo?i th?c ph?m nh? sp v th?t h?m, quy? vi? s? ph?i tnh l???ng carbohydrate trong m?i lo?i th?c ph?m c trong ?o?Marland Kitchen D??i ?y l danh sch kh?u ph?n ?n tiu chu?n c?a nh?ng th?c ph?m ph? bi?n giu carbohydrate. M?i kh?u ph?n trong s? ny ??u c kho?ng 15 g carbohydrate: 1 lt bnh m. 1 bnh m ng (bnh tortilla) c kch th??c su inch (15 cm). ?  c?c hay 2 ao-x? (53 g) c?m ho?c m ?ng. 1?2 c?c hay 3 ao-x? (85 g) ??u ho?c ??u l?ng ? n?u ho?c ?ng h?p, x? n??c v r?a s?ch.  c?c hay 3 ao-x? (85 g) rau c nhi?u tinh b?t, nh? ??u H Lan, ng ho?c b. 1?2 c?c hay 4 ao-x? (120 g) ng? c?c nng. 1?2 c?c hay 3 ao-x? (85 g) khoai ty lu?c ho?c nghi?n, ho?c 1?4 hay 3 ao-x? (85 g) khoai ty n??ng mi?ng l?n. 1?2 c?c hay 4 ao-x? ch?t l?ng (118 mL) n??c p tri cy. 1 c?c hay 8 ao-x? ch?t l?ng (237 mL) s?a. 1 qu? nh? hay 4 ao-x? (106 g) to. 1?2 tri hay 2 ao-x? (63 g) chu?i c? trung bnh. 1 c?c hay 5 ao-x? (150 g) du ty. 3 c?c hay 1 ao-x? (28,3 g) b?ng ng. V d? v? cch tnh carbohydrate l g? ?? tnh ton s? gram carbohydrate trong b?a ?n m?u ny, hy lm theo cc b??c d??i ?y. B?a ?n m?u 3 ao-x? (85 g) l???n g. 2?3 c?c hay 4 ao-x? (106 g) g?o l?t. 1?2 c?c hay 3 ao-x? (85 g) ng. 1 c?c hay 8 ao-x? ch?t l?ng (237 mL) s?a. 1 c?c hay 5 ao-x?  (150 g) du ty ph?t kem t??i khng ???ng. Ti?nh l???ng carbohydrate Willis ??nh cc th?c ph?m ch?a carbohydrate: C?m. Ng. S?a. Du ty. Tnh s? kh?u ph?n ?n v?i m?i th?c ph?m qu v? c: 2 kh?u ph?n c?m. 1 kh?u ph?n ng. 1 kh?u ph?n s?a. 1 kh?u ph?n du ty. Nhn m?i s? kh?u ph?n ?o? v??i 15 g: 2 kh?u ph?n c?m x 15 g = 30 g. 1 kh?u ph?n ng x 15 g = 15 g. 1 kh?u ph?n s?a x 15 g = 15 g. 1 kh?u ph?n du ty x 15 g = 15 g. C?ng t?t c? cc s? ? ?? tm ra t?ng s? gam carbohydrate ?n va?o: 30 g + 15 g + 15 g + 15 g = 75 g t?ng Jonathan carbohydrate. C nh?ng l?i khuyn no ?? tun th? k? ho?ch ny? Mua s?m Xy d?ng k? ho?ch ?n u?ng v sau ? l?p danh sch mua s?m. Mua rau c? t??i v ?ng l?nh, tri cy t??i v ?ng l?nh, s?a, tr?ng, ??u, ??u l?ng v ng? c?c nguyn cm. Xem nhn th?c ph?m. Ch?n cc th?c ph?m c nhi?u ch?t x? v t ???ng h?n. Jonathan Willis cc th?c ph?m ch? bi?n s?n v th?c ph?m c thm ???ng. Ln k? ho?ch cho b?a ?n Nh?m m?c tiu dng cng s? gram carbohydrate trong m?i b?a ?n chnh v m?i l?n ?n nh?. Ln k? ho?ch ?n cc b?a ?n chnh v ?? ?n nh? th??ng xuyn, cn b?ng. N?i tm thm thng tin Jonathan Willis (Hi?p h?i Ti?u ????ng Hoa K?): diabetes.org Jonathan Willis (Trung tm Ki?m sot v Phng ng?a D?ch b?nh): [email or web address] Jonathan Willis (H?c vi?n [place] d??ng v ?n u?ng): [email or web address] Jonathan Willis (Hi?p h?i Chuyn gia Gio d?c v Ch?m [place] B?nh ti?u ???ng): [email or web address] Tm t?t Ti?nh l???ng carbohydrate l m?t ph??ng php theo di Jonathan carbohydrate m quy? vi? ?n. ?n carbohydrate lm t?ng Jonathan ???ng (glucose) trong mu. Vi?c tnh Jonathan carbohydrate qu v? ?n gip c?i thi?n kh? n?ng ki?m sot ???ng huy?t c?a qu v?. Vi?c ny gip qu v? qu?n l b?nh ti?u ???ng c?a mnh. Chuyn gia v? ch? ?? ?n c th? gip qu v? l?p  k? ho?ch cho b?a ?n v tnh Jonathan  carbohydrate qu v? nn ?n vo m?i b?a ?n chnh v b?a ?n nh?Jonathan Willis tin ny khng nh?m m?c ?ch thay th? cho l?i khuyn m chuyn gia ch?m Jonathan Willis s?c kh?e ni v?i qu v?. Hy b?o ??m qu v? ph?i th?o lu?n b?t k? v?n ?? g m qu v? c v?i chuyn gia ch?m Jonathan Willis s?c kh?e c?a qu v?. Document Revised: 08/11/2020 Document Reviewed: 08/11/2020 Elsevier Patient Education  2024 [employer].

## 2022-11-23 LAB — CBC
HCT: 47.4 % (ref 38.5–50.0)
Hemoglobin: 15.7 g/dL (ref 13.2–17.1)
MCH: 31 pg (ref 27.0–33.0)
MCHC: 33.1 g/dL (ref 32.0–36.0)
MCV: 93.7 fL (ref 80.0–100.0)
MPV: 10 fL (ref 7.5–12.5)
Platelets: 267 10*3/uL (ref 140–400)
RBC: 5.06 10*6/uL (ref 4.20–5.80)
RDW: 12.1 % (ref 11.0–15.0)
WBC: 5.9 10*3/uL (ref 3.8–10.8)

## 2022-11-23 LAB — MICROALBUMIN / CREATININE URINE RATIO
Creatinine, Urine: 111 mg/dL (ref 20–320)
Microalb Creat Ratio: 24 mg/g{creat} (ref <30)
Microalb, Ur: 2.7 mg/dL

## 2022-11-23 LAB — COMPLETE METABOLIC PANEL WITH GFR
AG Ratio: 1.3 (calc) (ref 1.0–2.5)
ALT: 30 U/L (ref 9–46)
AST: 19 U/L (ref 10–35)
Albumin: 4.3 g/dL (ref 3.6–5.1)
Alkaline phosphatase (APISO): 88 U/L (ref 35–144)
BUN: 22 mg/dL (ref 7–25)
CO2: 27 mmol/L (ref 20–32)
Calcium: 9.8 mg/dL (ref 8.6–10.3)
Chloride: 99 mmol/L (ref 98–110)
Creat: 0.97 mg/dL (ref 0.70–1.28)
Globulin: 3.4 g/dL (ref 1.9–3.7)
Glucose, Bld: 289 mg/dL — ABNORMAL HIGH (ref 65–139)
Potassium: 4.6 mmol/L (ref 3.5–5.3)
Sodium: 136 mmol/L (ref 135–146)
Total Bilirubin: 0.4 mg/dL (ref 0.2–1.2)
Total Protein: 7.7 g/dL (ref 6.1–8.1)
eGFR: 80 mL/min/{1.73_m2} (ref >=60)

## 2022-11-23 LAB — LIPID PANEL
Cholesterol: 260 mg/dL — ABNORMAL HIGH (ref <200)
HDL: 49 mg/dL (ref >=40)
LDL Cholesterol (Calc): 150 mg/dL — ABNORMAL HIGH
Non-HDL Cholesterol (Calc): 211 mg/dL — ABNORMAL HIGH (ref <130)
Total CHOL/HDL Ratio: 5.3 (calc) — ABNORMAL HIGH (ref <5.0)
Triglycerides: 397 mg/dL — ABNORMAL HIGH (ref <150)

## 2022-11-25 ENCOUNTER — Encounter: Payer: Self-pay | Admitting: Internal Medicine

## 2022-12-22 ENCOUNTER — Ambulatory Visit (INDEPENDENT_AMBULATORY_CARE_PROVIDER_SITE_OTHER): Payer: Medicare Other | Admitting: Internal Medicine

## 2022-12-22 ENCOUNTER — Encounter: Payer: Self-pay | Admitting: Internal Medicine

## 2022-12-22 VITALS — BP 122/68 | HR 64 | Temp 95.9°F | Wt 110.0 lb

## 2022-12-22 DIAGNOSIS — J441 Chronic obstructive pulmonary disease with (acute) exacerbation: Secondary | ICD-10-CM | POA: Diagnosis not present

## 2022-12-22 MED ORDER — PREDNISONE 10 MG PO TABS
ORAL_TABLET | ORAL | 0 refills | Status: DC
Start: 2022-12-22 — End: 2023-02-21

## 2022-12-22 MED ORDER — PROMETHAZINE-DM 6.25-15 MG/5ML PO SYRP: 5.0000 mL | ORAL_SOLUTION | Freq: Four times a day (QID) | ORAL | 0 refills | Status: DC | PRN

## 2022-12-22 MED ORDER — AZITHROMYCIN 250 MG PO TABS
ORAL_TABLET | ORAL | 0 refills | Status: DC
Start: 2022-12-22 — End: 2023-02-09

## 2022-12-22 NOTE — Patient Instructions (Signed)
COPD Exacerbation This video will teach you what types of triggers can make COPD worse, and how to avoid them. To view the content, go to this web address: [email or web address]  This video will expire on: 09/18/2024. If you need access to this video following this date, please reach out to the healthcare provider who assigned it to you. This information is not intended to replace advice given to you by your health care provider. Make sure you discuss any questions you have with your health care provider. Elsevier Patient Education  2024 [employer].

## 2022-12-22 NOTE — Progress Notes (Signed)
Subjective:    Patient ID: Jonathan Willis, male    DOB: 1945/08/20, 77 y.o.   MRN: [id number]  HPI  Pt presents to the clinic today with c/o sore throat cough and shortness of breath.  This started 2 weeks ago.  He denies difficulty swallowing.  The cough is nonproductive.  He has been wearing his oxygen at home.  He denies headache, runny nose, nasal congestion, ear pain, chest pain, nausea, vomiting or diarrhea.  He denies fever, chills or bodyaches.  He has a history of COPD with chronic hypoxic respiratory failure, managed on Trelegy, DuoNebs but he reports this has not been providing any relief.  He has not had sick contacts that he is aware of.  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 8 g 1   aspirin EC 81 MG tablet Take 1 tablet (81 mg total) by mouth daily. Swallow whole.     Fluticasone-Umeclidin-Vilant (TRELEGY ELLIPTA) 100-62.5-25 MCG/ACT AEPB Inhale 1 puff into the lungs daily. 1 each 5   ipratropium-albuterol (DUONEB) 0.5-2.5 (3) MG/3ML SOLN Take 3 mLs by nebulization every 4 (four) hours as needed. 360 mL 1   metFORMIN (GLUCOPHAGE) 500 MG tablet Take 1 tablet (500 mg total) by mouth 2 (two) times daily with a meal. 180 tablet 1   OXYGEN Inhale into the lungs. 3 Liters     rosuvastatin (CRESTOR) 10 MG tablet Take 1 tablet (10 mg total) by mouth daily. 90 tablet 1   No current  facility-administered medications for this visit.    No Known Allergies  Family History  Problem Relation Age of Onset   COPD Neg Hx    Diabetes Mellitus II Neg Hx    Hypertension Neg Hx     Social History   Socioeconomic History   Marital status: Divorced    Spouse name: Not on file   Number of children: Not on file   Years of education: 9   Highest education level: 9th grade  Occupational History   Occupation: retired  Tobacco Use   Smoking status: Former    Current packs/day: 0.00    Average packs/day: 1.5 packs/day for 44.0 years (66.0 ttl pk-yrs)    Types: Cigarettes    Start date: 01/11/1963    Quit date: 01/11/2007    Years since quitting: 15.9   Smokeless tobacco: Former  Building services engineer status: Never Used  Substance and Sexual Activity   Alcohol use: Yes    Comment: very rarely   Drug use: Never   Sexual activity: Yes    Birth control/protection: None  Other Topics Concern   Not on file  Social History Narrative   Not on file   Social Determinants of Health   Financial Resource Strain: High Risk (03/04/2022)   Overall Financial Resource Strain (CARDIA)  Difficulty of Paying Living Expenses: Very hard  Food Insecurity: No Food Insecurity (04/24/2022)   Hunger Vital Sign    Worried About Running Out of Food in the Last Year: Never true    Ran Out of Food in the Last Year: Never true  Transportation Needs: No Transportation Needs (04/24/2022)   PRAPARE - Administrator, Civil Service (Medical): No    Lack of Transportation (Non-Medical): No  Physical Activity: Insufficiently Active (03/04/2022)   Exercise Vital Sign    Days of Exercise per Week: 4 days    Minutes of Exercise per Session: 30 min  Stress: No Stress Concern Present (03/04/2022)   [employer] of Occupational Health - Occupational Stress Questionnaire    Feeling of Stress : Not at all  Social Connections: Moderately Isolated (03/04/2022)   Social Connection and  Isolation Panel [NHANES]    Frequency of Communication with Friends and Family: More than three times a week    Frequency of Social Gatherings with Friends and Family: More than three times a week    Attends Religious Services: 1 to 4 times per year    Active Member of [employer] or Organizations: No    Attends Banker Meetings: Never    Marital Status: Divorced  Catering manager Violence: Not At Risk (04/24/2022)   Humiliation, Afraid, Rape, and Kick questionnaire    Fear of Current or Ex-Partner: No    Emotionally Abused: No    Physically Abused: No    Sexually Abused: No     Constitutional: Denies fever, malaise, fatigue, headache or abrupt weight changes.  HEENT: Patient reports sore throat.  Denies eye pain, eye redness, ear pain, ringing in the ears, wax buildup, runny nose, nasal congestion, bloody nose. Respiratory: Patient reports cough and shortness of breath.  Denies difficulty breathing, or sputum production.   Cardiovascular: Denies chest pain, chest tightness, palpitations or swelling in the hands or feet.  Gastrointestinal: Denies abdominal pain, bloating, constipation, diarrhea or blood in the stool.   No other specific complaints in a complete review of systems (except as listed in HPI above).  Objective:   Physical Exam  BP 122/68 (BP Location: Right Arm, Patient Position: Sitting, Cuff Size: Normal)   Pulse 64   Temp (!) 95.9 F (35.5 C) (Temporal)   Wt 110 lb (49.9 kg)   SpO2 92%   BMI 20.78 kg/m   Wt Readings from Last 3 Encounters:  11/22/22 109 lb (49.4 kg)  09/03/22 109 lb 5.6 oz (49.6 kg)  08/01/22 110 lb (49.9 kg)    General: Appears his stated age, well developed, well nourished in NAD. HEENT: Head: normal shape and size, no sinus tenderness noted; Eyes: sclera white, no icterus, conjunctiva pink, PERRLA and EOMs intact;  Neck: No adenopathy noted. Cardiovascular: Normal rate and rhythm. S1,S2 noted.  No murmur, rubs or gallops noted.   Pulmonary/Chest: Increased effort and positive vesicular breath sounds with bilateral inspiratory and expiratory wheezing noted. No respiratory distress. No rales or ronchi noted.  Musculoskeletal: No difficulty with gait.  Neurological: Alert and oriented.  Coordination normal.    BMET    Component Value Date/Time   NA 136 11/22/2022 1031   NA 136 02/04/2014 0616   K 4.6 11/22/2022 1031   K 4.1 02/04/2014 0616   CL 99 11/22/2022 1031   CL 102 02/04/2014 0616   CO2 27 11/22/2022 1031   CO2 25 02/04/2014 0616   GLUCOSE 289 (H) 11/22/2022 1031  GLUCOSE 225 (H) 02/04/2014 0616   BUN 22 11/22/2022 1031   BUN 18 02/04/2014 0616   CREATININE 0.97 11/22/2022 1031   CALCIUM 9.8 11/22/2022 1031   CALCIUM 9.1 02/04/2014 0616   GFRNONAA >60 09/03/2022 2213   GFRNONAA 73 10/16/2018 0928   GFRAA >60 06/06/2019 1049   GFRAA 85 10/16/2018 0928    Lipid Panel     Component Value Date/Time   CHOL 260 (H) 11/22/2022 1031   TRIG 397 (H) 11/22/2022 1031   HDL 49 11/22/2022 1031   CHOLHDL 5.3 (H) 11/22/2022 1031   LDLCALC 150 (H) 11/22/2022 1031    CBC    Component Value Date/Time   WBC 5.9 11/22/2022 1031   RBC 5.06 11/22/2022 1031   HGB 15.7 11/22/2022 1031   HGB 14.1 02/06/2014 0345   HCT 47.4 11/22/2022 1031   HCT 42.3 02/06/2014 0345   PLT 267 11/22/2022 1031   PLT 336 02/06/2014 0345   MCV 93.7 11/22/2022 1031   MCV 95 02/06/2014 0345   MCH 31.0 11/22/2022 1031   MCHC 33.1 11/22/2022 1031   RDW 12.1 11/22/2022 1031   RDW 13.0 02/06/2014 0345   LYMPHSABS 2.9 08/01/2022 2247   LYMPHSABS 1.7 02/06/2014 0345   MONOABS 0.5 08/01/2022 2247   MONOABS 0.5 02/06/2014 0345   EOSABS 0.1 08/01/2022 2247   EOSABS 0.0 02/06/2014 0345   BASOSABS 0.0 08/01/2022 2247   BASOSABS 0.0 02/06/2014 0345    Hgb A1C Lab Results  Component Value Date   HGBA1C 7.9 (A) 11/22/2022          Assessment & Plan:   COPD exacerbation:  Encourage rest and fluids Continue Trelegy and  DuoNebs as previously prescribed Rx for Pred taper x 6 days Rx for azithromycin 250 mg x 5 days Rx for Promethazine DM cough syrup-Tatian caution given Advised him to schedule a follow-up appointment with pulmonology for further evaluation if symptoms persist or worsen.    RTC in 5 months, follow-up chronic conditions Nicki Reaper, NP

## 2023-01-04 DIAGNOSIS — F0390 Unspecified dementia without behavioral disturbance: Secondary | ICD-10-CM | POA: Diagnosis not present

## 2023-01-04 DIAGNOSIS — R946 Abnormal results of thyroid function studies: Secondary | ICD-10-CM | POA: Diagnosis not present

## 2023-01-04 DIAGNOSIS — F03A0 Unspecified dementia, mild, without behavioral disturbance, psychotic disturbance, mood disturbance, and anxiety: Secondary | ICD-10-CM | POA: Diagnosis not present

## 2023-01-06 ENCOUNTER — Other Ambulatory Visit: Payer: Self-pay | Admitting: Neurology

## 2023-01-06 DIAGNOSIS — F03A0 Unspecified dementia, mild, without behavioral disturbance, psychotic disturbance, mood disturbance, and anxiety: Secondary | ICD-10-CM

## 2023-01-09 ENCOUNTER — Inpatient Hospital Stay
Admission: RE | Admit: 2023-01-09 | Discharge: 2023-01-09 | Disposition: A | Discharge status: Home / Self Care | Payer: Medicare Other | Source: Ambulatory Visit | Attending: Neurology | Admitting: Neurology

## 2023-01-10 ENCOUNTER — Ambulatory Visit
Admission: RE | Admit: 2023-01-10 | Discharge: 2023-01-10 | Disposition: A | Discharge status: Home / Self Care | Payer: Medicare Other | Source: Ambulatory Visit | Attending: Neurology | Admitting: Neurology

## 2023-01-10 DIAGNOSIS — R413 Other amnesia: Secondary | ICD-10-CM | POA: Diagnosis not present

## 2023-01-10 DIAGNOSIS — F03A0 Unspecified dementia, mild, without behavioral disturbance, psychotic disturbance, mood disturbance, and anxiety: Secondary | ICD-10-CM

## 2023-02-07 ENCOUNTER — Ambulatory Visit: Payer: Medicare Other | Admitting: Internal Medicine

## 2023-02-09 ENCOUNTER — Encounter: Payer: Self-pay | Admitting: Internal Medicine

## 2023-02-09 ENCOUNTER — Ambulatory Visit (INDEPENDENT_AMBULATORY_CARE_PROVIDER_SITE_OTHER): Payer: Medicare Other | Admitting: Internal Medicine

## 2023-02-09 VITALS — BP 110/72 | HR 70 | Ht 61.0 in | Wt 108.4 lb

## 2023-02-09 DIAGNOSIS — F02A0 Dementia in other diseases classified elsewhere, mild, without behavioral disturbance, psychotic disturbance, mood disturbance, and anxiety: Secondary | ICD-10-CM | POA: Diagnosis not present

## 2023-02-09 DIAGNOSIS — E039 Hypothyroidism, unspecified: Secondary | ICD-10-CM | POA: Diagnosis not present

## 2023-02-09 DIAGNOSIS — R7989 Other specified abnormal findings of blood chemistry: Secondary | ICD-10-CM | POA: Diagnosis not present

## 2023-02-09 DIAGNOSIS — G301 Alzheimer's disease with late onset: Secondary | ICD-10-CM | POA: Diagnosis not present

## 2023-02-09 NOTE — Patient Instructions (Signed)
Thyroid-Stimulating Hormone Test Why am I having this test? The thyroid is a gland in the lower front of the neck. It makes hormones that affect many body parts and systems, including the system that affects how quickly the body burns fuel for energy (metabolism). The pituitary gland is located just below the brain, behind the eyes and nasal passages. It helps maintain thyroid hormone levels and thyroid gland function. You may have a thyroid-stimulating hormone (TSH) test if you have possible symptoms of abnormal thyroid hormone levels. This test can help your health care provider: Diagnose a disorder of the thyroid gland or pituitary gland. Manage your condition and treatment if you have an underactive thyroid (hypothyroidism) or an overactive thyroid (hyperthyroidism). Newborn babies may have this test done to screen for hypothyroidism that is present at birth (congenital). What is being tested? This test measures the amount of TSH in your blood. TSH may also be called thyrotropin. When the thyroid does not make enough hormones, the pituitary gland releases TSH into the bloodstream to stimulate the thyroid gland to make more hormones. What kind of sample is taken?     A blood sample is required for this test. It is usually collected by inserting a needle into a blood vessel. For newborns, a small amount of blood may be collected from the umbilical cord, or by using a small needle to prick the baby's heel (heel stick). Tell a health care provider about: All medicines you are taking, including vitamins, herbs, eye drops, creams, and over-the-counter medicines. Any bleeding problems you have. Any surgeries you have had. Any medical conditions you have. Whether you are pregnant or may be pregnant. How are the results reported? Your test results will be reported as a value that indicates how much TSH is in your blood. Your health care provider will compare your results to normal ranges that were  established after testing a large group of people (reference ranges). Reference ranges may vary among labs and hospitals. For this test, common reference ranges are: Adult: 2-10 microunits/mL or 2-10 milliunits/L. Newborn: Heel stick: 3-18 microunits/mL or 3-18 milliunits/L. Umbilical cord: 3-12 microunits/mL or 3-12 milliunits/L. What do the results mean? Results that are within the reference range are considered normal. This means that you have a normal amount of TSH in your blood. Results that are higher than the reference range mean that your TSH levels are too high. This may mean: Your thyroid gland is not making enough thyroid hormones. Your thyroid medicine dosage is too low. You have a tumor on your pituitary gland. This is rare. Results that are lower than the reference range mean that your TSH levels are too low. This may be caused by hyperthyroidism or by a problem with the pituitary gland function. Talk with your health care provider about what your results mean. Questions to ask your health care provider Ask your health care provider, or the department that is doing the test: When will my results be ready? How will I get my results? What are my treatment options? What other tests do I need? What are my next steps? Summary You may have a thyroid-stimulating hormone (TSH) test if you have possible symptoms of abnormal thyroid hormone levels. The thyroid is a gland in the lower front of the neck. It makes hormones that affect many body parts and systems. The pituitary gland is located just below the brain, behind the eyes and nasal passages. It helps maintain thyroid hormone levels and thyroid gland function. This test   measures the amount of TSH in your blood. TSH is made by the pituitary gland. It may also be called thyrotropin. This information is not intended to replace advice given to you by your health care provider. Make sure you discuss any questions you have with your  health care provider. Document Revised: 03/16/2021 Document Reviewed: 03/16/2021 Elsevier Patient Education  2024 Elsevier Inc.  

## 2023-02-09 NOTE — Progress Notes (Signed)
Subjective:    Patient ID: Jonathan Willis, male    DOB: 1945-06-09, 77 y.o.   MRN: [id number]  HPI  Discussed the use of AI scribe software for clinical note transcription with the patient, who gave verbal consent to proceed.  History of Present Illness   The patient, diagnosed with dementia, was referred for follow-up on an abnormal TSH level detected a month ago by neurology. The patient's caregiver reported that the patient had been started on a new medication for dementia, donepezil. The caregiver also mentioned that the patient had undergone a CT scan of the head on October 15th, which was reported as normal.  The caregiver was unsure about the significance of the abnormal TSH level and inquired about it. The patient has no known history of thyroid problems or any family history of the same.   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 8 g 1   aspirin EC 81 MG tablet Take 1 tablet (81 mg total) by mouth daily. Swallow whole.     azithromycin (ZITHROMAX) 250 MG tablet Take 2 tabs today, then 1 tab daily x 4 days 6 tablet 0   Fluticasone-Umeclidin-Vilant (TRELEGY ELLIPTA) 100-62.5-25 MCG/ACT AEPB Inhale 1 puff into the lungs daily. 1 each 5   furosemide (LASIX) 20 MG tablet Take 20 mg by mouth daily.     ipratropium-albuterol (DUONEB) 0.5-2.5 (3) MG/3ML SOLN Take 3 mLs by nebulization every 4 (four)  hours as needed. 360 mL 1   metFORMIN (GLUCOPHAGE) 500 MG tablet Take 1 tablet (500 mg total) by mouth 2 (two) times daily with a meal. 180 tablet 1   OXYGEN Inhale into the lungs. 3 Liters     predniSONE (DELTASONE) 10 MG tablet Take 6 tabs on day 1, 5 tabs on day 2, 4 tabs on day 3, 3 tabs on day 4, 2 tabs on day 5, 1 tab on day 6 21 tablet 0   promethazine-dextromethorphan (PROMETHAZINE-DM) 6.25-15 MG/5ML syrup Take 5 mLs by mouth 4 (four) times daily as needed. 118 mL 0   rosuvastatin (CRESTOR) 10 MG tablet Take 1 tablet (10 mg total) by mouth daily. 90 tablet 1   No current facility-administered medications for this visit.    No Known Allergies  Family History  Problem Relation Age of Onset   COPD Neg Hx    Diabetes Mellitus II Neg Hx    Hypertension Neg Hx     Social History   Socioeconomic History   Marital status: Divorced    Spouse name: Not on file   Number of children: Not on file   Years of education: 9   Highest education level: 9th grade  Occupational History   Occupation: retired  Tobacco Use   Smoking status: Former    Current packs/day: 0.00    Average packs/day: 1.5 packs/day for  44.0 years (66.0 ttl pk-yrs)    Types: Cigarettes    Start date: 01/11/1963    Quit date: 01/11/2007    Years since quitting: 16.0   Smokeless tobacco: Former  Building services engineer status: Never Used  Substance and Sexual Activity   Alcohol use: Yes    Comment: very rarely   Drug use: Never   Sexual activity: Yes    Birth control/protection: None  Other Topics Concern   Not on file  Social History Narrative   Not on file   Social Determinants of Health   Financial Resource Strain: High Risk (03/04/2022)   Overall Financial Resource Strain (CARDIA)    Difficulty of Paying Living Expenses: Very hard  Food Insecurity: No Food Insecurity (04/24/2022)   Hunger Vital Sign    Worried About Running Out of Food in the Last Year: Never true    Ran Out of Food in the Last Year:  Never true  Transportation Needs: No Transportation Needs (04/24/2022)   PRAPARE - Administrator, Civil Service (Medical): No    Lack of Transportation (Non-Medical): No  Physical Activity: Insufficiently Active (03/04/2022)   Exercise Vital Sign    Days of Exercise per Week: 4 days    Minutes of Exercise per Session: 30 min  Stress: No Stress Concern Present (03/04/2022)   [employer] of Occupational Health - Occupational Stress Questionnaire    Feeling of Stress : Not at all  Social Connections: Moderately Isolated (03/04/2022)   Social Connection and Isolation Panel [NHANES]    Frequency of Communication with Friends and Family: More than three times a week    Frequency of Social Gatherings with Friends and Family: More than three times a week    Attends Religious Services: 1 to 4 times per year    Active Member of [employer] or Organizations: No    Attends Banker Meetings: Never    Marital Status: Divorced  Catering manager Violence: Not At Risk (04/24/2022)   Humiliation, Afraid, Rape, and Kick questionnaire    Fear of Current or Ex-Partner: No    Emotionally Abused: No    Physically Abused: No    Sexually Abused: No     Constitutional: Denies fever, malaise, fatigue, headache or abrupt weight changes.  HEENT: Denies eye pain, eye redness, ear pain, ringing in the ears, wax buildup, runny nose, nasal congestion, bloody nose, or sore throat. Respiratory: Patient reports chronic shortness of breat. Denies difficulty breathing, cough or sputum production.   Cardiovascular: Denies chest pain, chest tightness, palpitations or swelling in the hands or feet.  Neurological: Patient reports difficulty with memory.  Denies dizziness, difficulty with speech or problems with balance and coordination.  Psych: Denies anxiety, depression, SI/HI.  No other specific complaints in a complete review of systems (except as listed in HPI above).      Objective:    Physical Exam BP 110/72   Pulse 70   Ht 5\' 1"  (1.549 m)   Wt 108 lb 6.4 oz (49.2 kg)   SpO2 95%   BMI 20.48 kg/m   Wt Readings from Last 3 Encounters:  12/22/22 110 lb (49.9 kg)  11/22/22 109 lb (49.4 kg)  09/03/22 109 lb 5.6 oz (49.6 kg)    General: Appears his stated age, well developed, well nourished in NAD. Skin: Warm, dry and intact.  HEENT: Head: normal shape and size; Eyes: sclera white, no icterus, conjunctiva pink, PERRLA and EOMs intact;  Neck:  Neck  supple, trachea midline. No masses, lumps or thyromegaly present.  Cardiovascular: Normal rate and rhythm. S1,S2 noted.  No murmur, rubs or gallops noted. No JVD or BLE edema. Pulmonary/Chest: Normal effort and positive vesicular breath sounds. No respiratory distress. No wheezes, rales or ronchi noted.  Musculoskeletal: No difficulty with gait.  Neurological: Alert and oriented. Coordination normal.    BMET    Component Value Date/Time   NA 136 11/22/2022 1031   NA 136 02/04/2014 0616   K 4.6 11/22/2022 1031   K 4.1 02/04/2014 0616   CL 99 11/22/2022 1031   CL 102 02/04/2014 0616   CO2 27 11/22/2022 1031   CO2 25 02/04/2014 0616   GLUCOSE 289 (H) 11/22/2022 1031   GLUCOSE 225 (H) 02/04/2014 0616   BUN 22 11/22/2022 1031   BUN 18 02/04/2014 0616   CREATININE 0.97 11/22/2022 1031   CALCIUM 9.8 11/22/2022 1031   CALCIUM 9.1 02/04/2014 0616   GFRNONAA >60 09/03/2022 2213   GFRNONAA 73 10/16/2018 0928   GFRAA >60 06/06/2019 1049   GFRAA 85 10/16/2018 0928    Lipid Panel     Component Value Date/Time   CHOL 260 (H) 11/22/2022 1031   TRIG 397 (H) 11/22/2022 1031   HDL 49 11/22/2022 1031   CHOLHDL 5.3 (H) 11/22/2022 1031   LDLCALC 150 (H) 11/22/2022 1031    CBC    Component Value Date/Time   WBC 5.9 11/22/2022 1031   RBC 5.06 11/22/2022 1031   HGB 15.7 11/22/2022 1031   HGB 14.1 02/06/2014 0345   HCT 47.4 11/22/2022 1031   HCT 42.3 02/06/2014 0345   PLT 267 11/22/2022 1031   PLT 336 02/06/2014 0345    MCV 93.7 11/22/2022 1031   MCV 95 02/06/2014 0345   MCH 31.0 11/22/2022 1031   MCHC 33.1 11/22/2022 1031   RDW 12.1 11/22/2022 1031   RDW 13.0 02/06/2014 0345   LYMPHSABS 2.9 08/01/2022 2247   LYMPHSABS 1.7 02/06/2014 0345   MONOABS 0.5 08/01/2022 2247   MONOABS 0.5 02/06/2014 0345   EOSABS 0.1 08/01/2022 2247   EOSABS 0.0 02/06/2014 0345   BASOSABS 0.0 08/01/2022 2247   BASOSABS 0.0 02/06/2014 0345    Hgb A1C Lab Results  Component Value Date   HGBA1C 7.9 (A) 11/22/2022            Assessment & Plan:   Assessment and Plan    Abnormal TSH Elevated TSH noted on previous lab work. No known family history of thyroid disease. Thyroid gland not enlarged or nodular on examination. -Order TSH, free T4, T3, and TPO antibodies for further evaluation. -If labs are abnormal, consider referral to endocrinology.  Dementia Diagnosed by neurology. CT head was normal. -Continue current medications as prescribed by neurology.   RTC in 3 months, follow-up chronic conditions Nicki Reaper, NP

## 2023-02-10 LAB — T4, FREE: Free T4: 0.9 ng/dL (ref 0.8–1.8)

## 2023-02-10 LAB — TSH: TSH: 6.38 m[IU]/L — ABNORMAL HIGH (ref 0.40–4.50)

## 2023-02-10 LAB — T3: T3, Total: 98 ng/dL (ref 76–181)

## 2023-02-10 LAB — THYROID PEROXIDASE ANTIBODIES (TPO) (REFL): Thyroperoxidase Ab SerPl-aCnc: <1 [IU]/mL (ref <9)

## 2023-02-14 ENCOUNTER — Other Ambulatory Visit: Payer: Self-pay | Admitting: Internal Medicine

## 2023-02-14 MED ORDER — LEVOTHYROXINE SODIUM 25 MCG PO TABS: 25.0000 ug | ORAL_TABLET | Freq: Every day | ORAL | 1 refills | Status: AC

## 2023-02-14 NOTE — Addendum Note (Signed)
Addended by: Lorre Munroe on: 02/14/2023 07:41 AM   Modules accepted: Orders

## 2023-02-15 NOTE — Telephone Encounter (Signed)
Requested medication (s) are due for refill today: no  Requested medication (s) are on the active medication list: yes   Last refill:  12/22/22 #21 0 refills  Future visit scheduled: yes in 3 months  Notes to clinic:  not delegated per protocol. Tapered drug. Do you want to  continue refilling Rx?     Requested Prescriptions  Pending Prescriptions Disp Refills   predniSONE (DELTASONE) 10 MG tablet [Pharmacy Med Name: PREDNISONE 10 MG TABLET] 21 tablet 0    Sig: Take 6 tabs on day 1, 5 tabs on day 2, 4 tabs on day 3, 3 tabs on day 4, 2 tabs on day 5, 1 tab on day 6     Not Delegated - Endocrinology:  Oral Corticosteroids Failed - 02/14/2023 10:02 AM      Failed - This refill cannot be delegated      Failed - Manual Review: Eye exam for IOP if prolonged treatment      Failed - Glucose (serum) in normal range and within 180 days    Glucose  Date Value Ref Range Status  02/04/2014 225 (H) 65 - 99 mg/dL Final   Glucose, Bld  Date Value Ref Range Status  11/22/2022 289 (H) 65 - 139 mg/dL Final    Comment:    .        Non-fasting reference interval .    Glucose-Capillary  Date Value Ref Range Status  04/27/2022 114 (H) 70 - 99 mg/dL Final    Comment:    Glucose reference range applies only to samples taken after fasting for at least 8 hours.         Failed - Bone Mineral Density or Dexa Scan completed in the last 2 years      Passed - K in normal range and within 180 days    Potassium  Date Value Ref Range Status  11/22/2022 4.6 3.5 - 5.3 mmol/L Final  02/04/2014 4.1 3.5 - 5.1 mmol/L Final         Passed - Na in normal range and within 180 days    Sodium  Date Value Ref Range Status  11/22/2022 136 135 - 146 mmol/L Final  02/04/2014 136 136 - 145 mmol/L Final         Passed - Last BP in normal range    BP Readings from Last 1 Encounters:  02/09/23 110/72         Passed - Valid encounter within last 6 months    Recent Outpatient Visits           6 days ago  Abnormal TSH   [place] [facility] [place], Salvadore Oxford, NP   1 month ago COPD exacerbation [facility])   [place] [facility] [place], [place] W, NP   2 months ago Type 2 diabetes mellitus with other specified complication, without long-term current use of insulin [facility])   [place] [facility] [place], Salvadore Oxford, NP   7 months ago Vertigo   [place] [facility] [place], [place] W, NP   9 months ago Sepsis, due to unspecified organism, unspecified whether acute organ dysfunction present [facility])   [place] [facility] [place], Salvadore Oxford, NP       Future Appointments             In 3 months [place], Salvadore Oxford, NP  [facility], [facility]

## 2023-02-19 ENCOUNTER — Encounter: Payer: Self-pay | Admitting: Internal Medicine

## 2023-02-19 ENCOUNTER — Inpatient Hospital Stay
Admission: EM | Admit: 2023-02-19 | Discharge: 2023-02-21 | DRG: 191 | Disposition: A | Discharge status: Home / Self Care | Payer: Medicare Other | Attending: Internal Medicine | Admitting: Internal Medicine

## 2023-02-19 ENCOUNTER — Emergency Department: Payer: Medicare Other

## 2023-02-19 ENCOUNTER — Other Ambulatory Visit: Payer: Self-pay

## 2023-02-19 DIAGNOSIS — N400 Enlarged prostate without lower urinary tract symptoms: Secondary | ICD-10-CM | POA: Diagnosis present

## 2023-02-19 DIAGNOSIS — Z7982 Long term (current) use of aspirin: Secondary | ICD-10-CM | POA: Diagnosis not present

## 2023-02-19 DIAGNOSIS — J440 Chronic obstructive pulmonary disease with acute lower respiratory infection: Principal | ICD-10-CM | POA: Diagnosis present

## 2023-02-19 DIAGNOSIS — R0602 Shortness of breath: Secondary | ICD-10-CM | POA: Diagnosis not present

## 2023-02-19 DIAGNOSIS — E119 Type 2 diabetes mellitus without complications: Secondary | ICD-10-CM

## 2023-02-19 DIAGNOSIS — Z9981 Dependence on supplemental oxygen: Secondary | ICD-10-CM | POA: Diagnosis not present

## 2023-02-19 DIAGNOSIS — G301 Alzheimer's disease with late onset: Secondary | ICD-10-CM | POA: Diagnosis present

## 2023-02-19 DIAGNOSIS — B348 Other viral infections of unspecified site: Secondary | ICD-10-CM | POA: Diagnosis not present

## 2023-02-19 DIAGNOSIS — B9789 Other viral agents as the cause of diseases classified elsewhere: Secondary | ICD-10-CM | POA: Diagnosis present

## 2023-02-19 DIAGNOSIS — J206 Acute bronchitis due to rhinovirus: Secondary | ICD-10-CM | POA: Diagnosis present

## 2023-02-19 DIAGNOSIS — A419 Sepsis, unspecified organism: Secondary | ICD-10-CM | POA: Diagnosis present

## 2023-02-19 DIAGNOSIS — E1165 Type 2 diabetes mellitus with hyperglycemia: Secondary | ICD-10-CM | POA: Diagnosis not present

## 2023-02-19 DIAGNOSIS — I10 Essential (primary) hypertension: Secondary | ICD-10-CM | POA: Diagnosis present

## 2023-02-19 DIAGNOSIS — F02A0 Dementia in other diseases classified elsewhere, mild, without behavioral disturbance, psychotic disturbance, mood disturbance, and anxiety: Secondary | ICD-10-CM | POA: Diagnosis present

## 2023-02-19 DIAGNOSIS — Z87891 Personal history of nicotine dependence: Secondary | ICD-10-CM

## 2023-02-19 DIAGNOSIS — F0280 Dementia in other diseases classified elsewhere without behavioral disturbance: Secondary | ICD-10-CM | POA: Diagnosis present

## 2023-02-19 DIAGNOSIS — Z7984 Long term (current) use of oral hypoglycemic drugs: Secondary | ICD-10-CM | POA: Diagnosis not present

## 2023-02-19 DIAGNOSIS — J8410 Pulmonary fibrosis, unspecified: Secondary | ICD-10-CM | POA: Insufficient documentation

## 2023-02-19 DIAGNOSIS — J208 Acute bronchitis due to other specified organisms: Secondary | ICD-10-CM | POA: Diagnosis present

## 2023-02-19 DIAGNOSIS — R0789 Other chest pain: Secondary | ICD-10-CM | POA: Diagnosis not present

## 2023-02-19 DIAGNOSIS — J439 Emphysema, unspecified: Secondary | ICD-10-CM | POA: Insufficient documentation

## 2023-02-19 DIAGNOSIS — T380X5A Adverse effect of glucocorticoids and synthetic analogues, initial encounter: Secondary | ICD-10-CM | POA: Diagnosis present

## 2023-02-19 DIAGNOSIS — Z79899 Other long term (current) drug therapy: Secondary | ICD-10-CM

## 2023-02-19 DIAGNOSIS — E872 Acidosis, unspecified: Secondary | ICD-10-CM | POA: Diagnosis present

## 2023-02-19 DIAGNOSIS — Z91199 Patient's noncompliance with other medical treatment and regimen due to unspecified reason: Secondary | ICD-10-CM | POA: Diagnosis not present

## 2023-02-19 DIAGNOSIS — J984 Other disorders of lung: Secondary | ICD-10-CM | POA: Diagnosis not present

## 2023-02-19 DIAGNOSIS — J9610 Chronic respiratory failure, unspecified whether with hypoxia or hypercapnia: Secondary | ICD-10-CM | POA: Diagnosis present

## 2023-02-19 DIAGNOSIS — Z87442 Personal history of urinary calculi: Secondary | ICD-10-CM | POA: Diagnosis not present

## 2023-02-19 DIAGNOSIS — J441 Chronic obstructive pulmonary disease with (acute) exacerbation: Secondary | ICD-10-CM | POA: Diagnosis not present

## 2023-02-19 DIAGNOSIS — Y92239 Unspecified place in hospital as the place of occurrence of the external cause: Secondary | ICD-10-CM | POA: Diagnosis not present

## 2023-02-19 DIAGNOSIS — J988 Other specified respiratory disorders: Secondary | ICD-10-CM | POA: Insufficient documentation

## 2023-02-19 DIAGNOSIS — Z8611 Personal history of tuberculosis: Secondary | ICD-10-CM | POA: Diagnosis not present

## 2023-02-19 DIAGNOSIS — R079 Chest pain, unspecified: Secondary | ICD-10-CM

## 2023-02-19 DIAGNOSIS — R5383 Other fatigue: Secondary | ICD-10-CM | POA: Diagnosis present

## 2023-02-19 DIAGNOSIS — R000 Tachycardia, unspecified: Secondary | ICD-10-CM | POA: Diagnosis present

## 2023-02-19 LAB — BLOOD GAS, VENOUS
Acid-base deficit: 0.1 mmol/L (ref 0.0–2.0)
Bicarbonate: 23.9 mmol/L (ref 20.0–28.0)
O2 Saturation: 95.1 %
Patient temperature: 37
pCO2, Ven: 36 mm[Hg] — ABNORMAL LOW (ref 44–60)
pH, Ven: 7.43 (ref 7.25–7.43)
pO2, Ven: 66 mm[Hg] — ABNORMAL HIGH (ref 32–45)

## 2023-02-19 LAB — CBC
HCT: 42.3 % (ref 39.0–52.0)
Hemoglobin: 14.6 g/dL (ref 13.0–17.0)
MCH: 31.3 pg (ref 26.0–34.0)
MCHC: 34.5 g/dL (ref 30.0–36.0)
MCV: 90.8 fL (ref 80.0–100.0)
Platelets: 234 10*3/uL (ref 150–400)
RBC: 4.66 MIL/uL (ref 4.22–5.81)
RDW: 12.8 % (ref 11.5–15.5)
WBC: 14.9 10*3/uL — ABNORMAL HIGH (ref 4.0–10.5)
nRBC: 0 % (ref 0.0–0.2)

## 2023-02-19 LAB — RESPIRATORY PANEL BY PCR

## 2023-02-19 LAB — BASIC METABOLIC PANEL
Anion gap: 15 (ref 5–15)
Anion gap: 7 (ref 5–15)
BUN: 14 mg/dL (ref 8–23)
BUN: 26 mg/dL — ABNORMAL HIGH (ref 8–23)
CO2: 23 mmol/L (ref 22–32)
CO2: 24 mmol/L (ref 22–32)
Calcium: 9.2 mg/dL (ref 8.9–10.3)
Calcium: 8.9 mg/dL (ref 8.9–10.3)
Chloride: 95 mmol/L — ABNORMAL LOW (ref 98–111)
Chloride: 100 mmol/L (ref 98–111)
Creatinine, Ser: 0.94 mg/dL (ref 0.61–1.24)
Creatinine, Ser: 0.92 mg/dL (ref 0.61–1.24)
GFR, Estimated: >60 mL/min (ref >60)
GFR, Estimated: >60 mL/min (ref >60)
Glucose, Bld: 283 mg/dL — ABNORMAL HIGH (ref 70–99)
Glucose, Bld: 391 mg/dL — ABNORMAL HIGH (ref 70–99)
Potassium: 3.3 mmol/L — ABNORMAL LOW (ref 3.5–5.1)
Potassium: 3.6 mmol/L (ref 3.5–5.1)
Sodium: 133 mmol/L — ABNORMAL LOW (ref 135–145)
Sodium: 131 mmol/L — ABNORMAL LOW (ref 135–145)

## 2023-02-19 LAB — URINALYSIS, ROUTINE W REFLEX MICROSCOPIC
Bacteria, UA: NONE SEEN
Bilirubin Urine: NEGATIVE
Glucose, UA: >=500 mg/dL — AB
Hgb urine dipstick: NEGATIVE
Ketones, ur: 5 mg/dL — AB
Nitrite: NEGATIVE
Protein, ur: 30 mg/dL — AB
Specific Gravity, Urine: 1.025 (ref 1.005–1.030)
pH: 7 (ref 5.0–8.0)

## 2023-02-19 LAB — STREP PNEUMONIAE URINARY ANTIGEN: Strep Pneumo Urinary Antigen: NEGATIVE

## 2023-02-19 LAB — RESP PANEL BY RT-PCR (RSV, FLU A&B, COVID)  RVPGX2
Influenza A by PCR: NEGATIVE
Influenza B by PCR: NEGATIVE
Resp Syncytial Virus by PCR: NEGATIVE
SARS Coronavirus 2 by RT PCR: NEGATIVE

## 2023-02-19 LAB — GLUCOSE, CAPILLARY
Glucose-Capillary: 394 mg/dL — ABNORMAL HIGH (ref 70–99)
Glucose-Capillary: 497 mg/dL — ABNORMAL HIGH (ref 70–99)
Glucose-Capillary: 595 mg/dL (ref 70–99)
Glucose-Capillary: 347 mg/dL — ABNORMAL HIGH (ref 70–99)
Glucose-Capillary: 289 mg/dL — ABNORMAL HIGH (ref 70–99)
Glucose-Capillary: 108 mg/dL — ABNORMAL HIGH (ref 70–99)

## 2023-02-19 LAB — D-DIMER, QUANTITATIVE: D-Dimer, Quant: 0.49 ug{FEU}/mL (ref 0.00–0.50)

## 2023-02-19 LAB — LACTIC ACID, PLASMA
Lactic Acid, Venous: 3.5 mmol/L (ref 0.5–1.9)
Lactic Acid, Venous: 4.5 mmol/L (ref 0.5–1.9)
Lactic Acid, Venous: 5.3 mmol/L (ref 0.5–1.9)
Lactic Acid, Venous: 5 mmol/L (ref 0.5–1.9)

## 2023-02-19 LAB — PROCALCITONIN: Procalcitonin: 0.12 ng/mL

## 2023-02-19 LAB — MRSA NEXT GEN BY PCR, NASAL: MRSA by PCR Next Gen: NOT DETECTED

## 2023-02-19 LAB — TROPONIN I (HIGH SENSITIVITY)
Troponin I (High Sensitivity): 6 ng/L (ref <18)
Troponin I (High Sensitivity): 6 ng/L (ref <18)

## 2023-02-19 MED ORDER — SODIUM CHLORIDE 0.9 % IV SOLN
2.0000 g | Freq: Two times a day (BID) | INTRAVENOUS | Status: DC
  Administered 2023-02-19: 2 g via INTRAVENOUS
  Filled 2023-02-19 (×2): qty 12.5

## 2023-02-19 MED ORDER — ALBUTEROL SULFATE (2.5 MG/3ML) 0.083% IN NEBU: 2.5000 mg | INHALATION_SOLUTION | RESPIRATORY_TRACT | Status: DC | PRN

## 2023-02-19 MED ORDER — IPRATROPIUM-ALBUTEROL 0.5-2.5 (3) MG/3ML IN SOLN
3.0000 mL | Freq: Two times a day (BID) | RESPIRATORY_TRACT | Status: DC
  Administered 2023-02-19 – 2023-02-21 (×4): 3 mL via RESPIRATORY_TRACT
  Filled 2023-02-19 (×4): qty 3

## 2023-02-19 MED ORDER — ACETAMINOPHEN 500 MG PO TABS
1000.0000 mg | ORAL_TABLET | Freq: Once | ORAL | Status: AC
  Administered 2023-02-19: 1000 mg via ORAL
  Filled 2023-02-19: qty 2

## 2023-02-19 MED ORDER — METHYLPREDNISOLONE SODIUM SUCC 40 MG IJ SOLR
40.0000 mg | Freq: Two times a day (BID) | INTRAMUSCULAR | Status: DC
  Administered 2023-02-19: 40 mg via INTRAVENOUS
  Filled 2023-02-19: qty 1

## 2023-02-19 MED ORDER — PREDNISONE 20 MG PO TABS
40.0000 mg | ORAL_TABLET | Freq: Every day | ORAL | Status: DC
Start: 2023-02-20 — End: 2023-02-19

## 2023-02-19 MED ORDER — SODIUM CHLORIDE 0.9 % IV SOLN
2.0000 g | Freq: Once | INTRAVENOUS | Status: AC
  Administered 2023-02-19: 2 g via INTRAVENOUS
  Filled 2023-02-19: qty 12.5

## 2023-02-19 MED ORDER — IPRATROPIUM-ALBUTEROL 0.5-2.5 (3) MG/3ML IN SOLN
3.0000 mL | Freq: Once | RESPIRATORY_TRACT | Status: AC
  Administered 2023-02-19: 3 mL via RESPIRATORY_TRACT
  Filled 2023-02-19: qty 3

## 2023-02-19 MED ORDER — LACTATED RINGERS IV SOLN: INTRAVENOUS | Status: DC

## 2023-02-19 MED ORDER — VANCOMYCIN HCL IN DEXTROSE 1-5 GM/200ML-% IV SOLN
1000.0000 mg | Freq: Once | INTRAVENOUS | Status: AC
  Administered 2023-02-19: 1000 mg via INTRAVENOUS
  Filled 2023-02-19: qty 200

## 2023-02-19 MED ORDER — INSULIN ASPART 100 UNIT/ML IJ SOLN
0.0000 [IU] | Freq: Three times a day (TID) | INTRAMUSCULAR | Status: DC
Start: 2023-02-19 — End: 2023-02-19

## 2023-02-19 MED ORDER — INSULIN ASPART 100 UNIT/ML IJ SOLN
0.0000 [IU] | INTRAMUSCULAR | Status: DC
  Administered 2023-02-19: 11 [IU] via SUBCUTANEOUS
  Administered 2023-02-20: 8 [IU] via SUBCUTANEOUS
  Administered 2023-02-20: 3 [IU] via SUBCUTANEOUS
  Administered 2023-02-20: 5 [IU] via SUBCUTANEOUS
  Administered 2023-02-20: 8 [IU] via SUBCUTANEOUS
  Filled 2023-02-19 (×5): qty 1

## 2023-02-19 MED ORDER — ENOXAPARIN SODIUM 40 MG/0.4ML IJ SOSY
40.0000 mg | PREFILLED_SYRINGE | INTRAMUSCULAR | Status: DC
  Administered 2023-02-19 – 2023-02-21 (×3): 40 mg via SUBCUTANEOUS
  Filled 2023-02-19 (×3): qty 0.4

## 2023-02-19 MED ORDER — INSULIN ASPART 100 UNIT/ML IJ SOLN
15.0000 [IU] | Freq: Once | INTRAMUSCULAR | Status: AC
  Administered 2023-02-19: 15 [IU] via SUBCUTANEOUS
  Filled 2023-02-19: qty 1

## 2023-02-19 MED ORDER — SODIUM CHLORIDE 0.9 % IV BOLUS
1000.0000 mL | Freq: Once | INTRAVENOUS | Status: AC
  Administered 2023-02-19: 1000 mL via INTRAVENOUS

## 2023-02-19 MED ORDER — METRONIDAZOLE 500 MG/100ML IV SOLN
500.0000 mg | Freq: Once | INTRAVENOUS | Status: AC
  Administered 2023-02-19: 500 mg via INTRAVENOUS
  Filled 2023-02-19: qty 100

## 2023-02-19 MED ORDER — SODIUM CHLORIDE 0.9 % IV SOLN: 500.0000 mg | INTRAVENOUS | Status: DC

## 2023-02-19 MED ORDER — IPRATROPIUM-ALBUTEROL 0.5-2.5 (3) MG/3ML IN SOLN: 3.0000 mL | Freq: Four times a day (QID) | RESPIRATORY_TRACT | Status: DC

## 2023-02-19 MED ORDER — METHYLPREDNISOLONE SODIUM SUCC 125 MG IJ SOLR
125.0000 mg | Freq: Once | INTRAMUSCULAR | Status: AC
  Administered 2023-02-19: 125 mg via INTRAVENOUS
  Filled 2023-02-19: qty 2

## 2023-02-19 MED ORDER — ALBUTEROL SULFATE (2.5 MG/3ML) 0.083% IN NEBU
2.5000 mg | INHALATION_SOLUTION | Freq: Once | RESPIRATORY_TRACT | Status: AC
  Administered 2023-02-19: 2.5 mg via RESPIRATORY_TRACT
  Filled 2023-02-19: qty 3

## 2023-02-19 MED ORDER — ROSUVASTATIN CALCIUM 10 MG PO TABS
10.0000 mg | ORAL_TABLET | Freq: Every day | ORAL | Status: DC
  Administered 2023-02-19 – 2023-02-21 (×3): 10 mg via ORAL
  Filled 2023-02-19 (×4): qty 1

## 2023-02-19 MED ORDER — DONEPEZIL HCL 5 MG PO TABS
5.0000 mg | ORAL_TABLET | Freq: Every day | ORAL | Status: DC
  Administered 2023-02-19 – 2023-02-20 (×2): 5 mg via ORAL
  Filled 2023-02-19 (×2): qty 1

## 2023-02-19 MED ORDER — LACTATED RINGERS IV BOLUS
1000.0000 mL | Freq: Once | INTRAVENOUS | Status: AC
  Administered 2023-02-19: 1000 mL via INTRAVENOUS

## 2023-02-19 MED ORDER — KCL IN DEXTROSE-NACL 20-5-0.45 MEQ/L-%-% IV SOLN
INTRAVENOUS | Status: DC
  Filled 2023-02-19 (×2): qty 1000

## 2023-02-19 MED ORDER — ADULT MULTIVITAMIN W/MINERALS CH
1.0000 | ORAL_TABLET | Freq: Every day | ORAL | Status: DC
  Administered 2023-02-19 – 2023-02-21 (×3): 1 via ORAL
  Filled 2023-02-19 (×3): qty 1

## 2023-02-19 MED ORDER — SODIUM CHLORIDE 0.9 % IV SOLN
2.0000 g | INTRAVENOUS | Status: DC
  Filled 2023-02-19 (×2): qty 20

## 2023-02-19 MED ORDER — FUROSEMIDE 20 MG PO TABS: 20.0000 mg | ORAL_TABLET | Freq: Every day | ORAL | Status: DC

## 2023-02-19 MED ORDER — ACETAMINOPHEN 650 MG RE SUPP: 650.0000 mg | Freq: Four times a day (QID) | RECTAL | Status: DC | PRN

## 2023-02-19 MED ORDER — ACETAMINOPHEN 325 MG PO TABS: 650.0000 mg | ORAL_TABLET | Freq: Four times a day (QID) | ORAL | Status: DC | PRN

## 2023-02-19 MED ORDER — INSULIN GLARGINE-YFGN 100 UNIT/ML ~~LOC~~ SOLN
20.0000 [IU] | Freq: Every day | SUBCUTANEOUS | Status: DC
  Filled 2023-02-19: qty 0.2

## 2023-02-19 MED ORDER — ENSURE ENLIVE PO LIQD: 237.0000 mL | Freq: Two times a day (BID) | ORAL | Status: DC

## 2023-02-19 MED ORDER — INSULIN ASPART 100 UNIT/ML IJ SOLN
0.0000 [IU] | Freq: Every day | INTRAMUSCULAR | Status: DC
Start: 2023-02-19 — End: 2023-02-20

## 2023-02-19 MED ORDER — ASPIRIN 81 MG PO TBEC
81.0000 mg | DELAYED_RELEASE_TABLET | Freq: Every day | ORAL | Status: DC
  Administered 2023-02-19 – 2023-02-21 (×3): 81 mg via ORAL
  Filled 2023-02-19 (×3): qty 1

## 2023-02-19 MED ORDER — ONDANSETRON HCL 4 MG/2ML IJ SOLN: 4.0000 mg | Freq: Four times a day (QID) | INTRAMUSCULAR | Status: DC | PRN

## 2023-02-19 MED ORDER — INSULIN ASPART 100 UNIT/ML IJ SOLN
0.0000 [IU] | Freq: Three times a day (TID) | INTRAMUSCULAR | Status: DC
  Administered 2023-02-19: 9 [IU] via SUBCUTANEOUS
  Filled 2023-02-19: qty 1

## 2023-02-19 MED ORDER — INSULIN ASPART 100 UNIT/ML IJ SOLN
15.0000 [IU] | Freq: Once | INTRAMUSCULAR | Status: AC
  Administered 2023-02-19: 15 [IU] via SUBCUTANEOUS

## 2023-02-19 MED ORDER — INSULIN ASPART 100 UNIT/ML IJ SOLN: 0.0000 [IU] | Freq: Every day | INTRAMUSCULAR | Status: DC

## 2023-02-19 MED ORDER — ONDANSETRON HCL 4 MG PO TABS: 4.0000 mg | ORAL_TABLET | Freq: Four times a day (QID) | ORAL | Status: DC | PRN

## 2023-02-19 MED ORDER — IPRATROPIUM-ALBUTEROL 0.5-2.5 (3) MG/3ML IN SOLN
3.0000 mL | Freq: Four times a day (QID) | RESPIRATORY_TRACT | Status: DC
Start: 2023-02-19 — End: 2023-02-19
  Administered 2023-02-19: 3 mL via RESPIRATORY_TRACT
  Filled 2023-02-19: qty 3

## 2023-02-19 MED ORDER — INSULIN GLARGINE-YFGN 100 UNIT/ML ~~LOC~~ SOLN
5.0000 [IU] | Freq: Every day | SUBCUTANEOUS | Status: DC
  Administered 2023-02-19: 5 [IU] via SUBCUTANEOUS
  Filled 2023-02-19 (×3): qty 0.05

## 2023-02-19 NOTE — Evaluation (Signed)
Physical Therapy Evaluation Patient Details Name: Jonathan Willis MRN: [id number] DOB: 02-23-1946 Today's Date: 02/19/2023  History of Present Illness  Pt admitted to [facility] on 02/19/23 for c/o ongoing chest pain x3 days, fatigue, and SOB. Pt requires 3L O2 via [place] for COPD, but does not always wear it due to Alzheimer's dementia. Work up reveals sepsis, COPD exacerbation, and respiratory tract infection. Significant PMH includes: COPD (noncompliant on 3L), Alz dementia, HTN, and diabetes.   Clinical Impression  Pt is a 77 year old M admitted to hospital on 02/19/23 for sepsis. Pt questionable historian of health due to baseline dementia. At baseline, pt reports being IND with ADL's, IADL's, ambulation without AD (uses walking stick outside the home); family assists with medication management and transportation.   Pt presents with mild deficits in dynamic standing balance, decreased activity tolerance, and c/o chest discomfort with mobility, resulting in impaired functional mobility. Due to deficits, pt required supervision for safety with bed mobility, supervision for transfers without AD, and CGA-supervision to ambulate 158ft without AD. HR and SpO2 stable at 108bpm and 98% on 2L respectively. Denies chest pain radiating into shoulders/jaw, and states he only feels this when he performs stair navigation/prolonged walking; likely contributed by decreased activity tolerance.  Deficits limit the pt's ability to safely and independently perform ADL's, transfer, and ambulate. Pt will benefit from acute skilled PT services to address deficits for return to baseline function. He will benefit from post acute therapy services to address activity tolerance and safety with mobility.         If plan is discharge home, recommend the following: Assistance with cooking/housework     Equipment Recommendations None recommended by PT     Functional Status Assessment Patient has had a recent decline in their functional  status and demonstrates the ability to make significant improvements in function in a reasonable and predictable amount of time.     Precautions / Restrictions Precautions Precautions: Fall Precaution Comments: Droplet Restrictions Other Position/Activity Restrictions: needs vietnamese interpreter - Interpreter Ghuong ID# [phone]      Mobility  Bed Mobility               General bed mobility comments: supervision for safety with supine>sit transfer    Transfers                   General transfer comment: supervision for safety for transfers from EOB and to recliner without AD    Ambulation/Gait Ambulation/Gait assistance: Supervision, Contact guard assist Gait Distance (Feet): 160 Feet Assistive device: None         General Gait Details: Initially CGA progressing to supervision for safety to ambulate in hallway without AD. Demonstrates narrow BOS, decreased step length/foot clearance bilaterally, and mild unsteadiness with head turns.     Balance   Sitting-balance support: No upper extremity supported, Feet supported Sitting balance-Leahy Scale: Normal Sitting balance - Comments: no LOB EOB   Standing balance support: No upper extremity supported, During functional activity   Standing balance comment: mostly good balance with periods of fair due to imbalance with head turns                             Pertinent Vitals/Pain Pain Assessment Pain Assessment: No/denies pain    Home Living Family/patient expects to be discharged to:: Private residence Living Arrangements: Children;Other relatives Available Help at Discharge: Family Type of Home: House Home Access: Level entry  Alternate Level Stairs-Number of Steps: flight- 14 Home Layout: Multi-level (lives in basement of son and DIL house) Home Equipment: None      Prior Function Prior Level of Function : Needs assist       Physical Assist : ADLs (physical)   ADLs (physical):  IADLs Mobility Comments: amb with bamboo stick in the yard, no AD in the house ADLs Comments: pt reports indep in ADLs, assist for IADLs, DIL manages medications     Extremity/Trunk Assessment   Upper Extremity Assessment Upper Extremity Assessment: Defer to OT evaluation    Lower Extremity Assessment Lower Extremity Assessment: Overall WFL for tasks assessed       Communication   Communication Communication: Other (comment) (interpreter Ghuong ID [phone] via telephone interpreter)  Cognition Arousal: Alert Behavior During Therapy: WFL for tasks assessed/performed Overall Cognitive Status: No family/caregiver present to determine baseline cognitive functioning Area of Impairment: Problem solving                             Problem Solving: Requires verbal cues          General Comments General comments (skin integrity, edema, etc.): 2L O2 via [place]; HR elevated at low 100's with SpO2 at 98%. C/o chest discomfort during ambulation that does not radiate into shoulder/jaw.    Exercises Other Exercises Other Exercises: Participates in bed mobility, transfers, and gait without AD.   Assessment/Plan    PT Assessment Patient needs continued PT services  PT Problem List Decreased activity tolerance;Decreased balance       PT Treatment Interventions Gait training;Stair training;Functional mobility training;Therapeutic activities;Therapeutic exercise;Balance training;Neuromuscular re-education;Patient/family education    PT Goals (Current goals can be found in the Care Plan section)  Acute Rehab PT Goals PT Goal Formulation: Patient unable to participate in goal setting    Frequency Min 1X/week     Co-evaluation PT/OT/SLP Co-Evaluation/Treatment: Yes Reason for Co-Treatment: Necessary to address cognition/behavior during functional activity           AM-PAC PT "6 Clicks" Mobility  Outcome Measure Help needed turning from your back to your side while in a  flat bed without using bedrails?: A Little Help needed moving from lying on your back to sitting on the side of a flat bed without using bedrails?: A Little Help needed moving to and from a bed to a chair (including a wheelchair)?: A Little Help needed standing up from a chair using your arms (e.g., wheelchair or bedside chair)?: A Little Help needed to walk in hospital room?: A Little Help needed climbing 3-5 steps with a railing? : A Little 6 Click Score: 18    End of Session   Activity Tolerance: Patient tolerated treatment well Patient left: in chair;with call bell/phone within reach;with chair alarm set Nurse Communication: Mobility status PT Visit Diagnosis: Unsteadiness on feet (R26.81);Pain Pain - Right/Left:  (chest)    Time: 3086-5784 PT Time Calculation (min) (ACUTE ONLY): 16 min   Charges:   PT Evaluation $PT Eval Low Complexity: 1 Low   PT General Charges $$ ACUTE PT VISIT: 1 Visit        Vira Blanco, PT, DPT 10:18 AM,02/19/23 Physical Therapist - [facility]

## 2023-02-19 NOTE — Progress Notes (Signed)
ED Pharmacy Antibiotic Sign Off An antibiotic consult was received from an ED provider for Vancomycin, Cefepime  per pharmacy dosing for sepsis. A chart review was completed to assess appropriateness.   The following one time order(s) were placed:  Vancomycin 1 gm IV X 1 and Cefepime 2 gm IV X 1   Further antibiotic and/or antibiotic pharmacy consults should be ordered by the admitting provider if indicated.   Thank you for allowing pharmacy to be a part of this patient's care.   Scherrie Gerlach [facility]  Clinical Pharmacist 02/19/23 4:15 AM

## 2023-02-19 NOTE — H&P (Signed)
History and Physical    Patient: Jonathan Willis OZD:[id number] DOB: 12/15/1945 DOA: 02/19/2023 DOS: the patient was seen and examined on 02/19/2023 PCP: Lorre Munroe, NP  Patient coming from: Home  Chief Complaint:  Chief Complaint  Patient presents with   Chest Pain   Shortness of Breath    HPI: Jonathan Willis is a 77 y.o. male with medical history significant for Dementia, COPD noncompliant with oxygen 3L, HTN and diabetes presenting with a several day history of generalized malaise, somnolence and lethargy.  He developed chest pain and shortness of breath on the night of arrival.  No fevers or chills.  Spoke with daughter at bedside as patient speaks [place] only. ED course and data review: Febrile at 100.8, pulse 124 and respirations 22 with O2 sat 90 to 93% on room air. Labs with WBC 15,000 and lactic acid 3.5, procalcitonin 0.12 Troponin 6, D-dimer 0.49 which is normal Potassium 3.3, glucose 283 Urinalysis with trace leukocyte esterase EKG, personally viewed and interpreted showing sinus tachycardia at 120 Chest x-ray nonacute shows chronic right upper lobe scarring Patient treated with DuoNeb and Solu-Medrol started on cefepime and vancomycin for sepsis of suspected respiratory source Hospitalist consulted for admission.     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   History of kidney stones    History of TB (tuberculosis)    Hypertension    Lower GI bleed    Tuberculosis 1995   Past Surgical History:  Procedure Laterality Date   BRONCHIAL WASHINGS N/A 04/27/2022   Procedure: BRONCHIAL WASHINGS;  Surgeon: Vida Rigger, MD;  Location: [facility] ORS;  Service: Thoracic;  Laterality: N/A;   COLONOSCOPY WITH PROPOFOL N/A 04/10/2017    Procedure: COLONOSCOPY WITH PROPOFOL;  Surgeon: Wyline Mood, MD;  Location: [facility] ENDOSCOPY;  Service: Gastroenterology;  Laterality: N/A;   NO PAST SURGERIES     RIGID BRONCHOSCOPY N/A 04/27/2022   Procedure: RIGID BRONCHOSCOPY;  Surgeon: Vida Rigger, MD;  Location: [facility] ORS;  Service: Thoracic;  Laterality: N/A;   Social History:  reports that he quit smoking about 16 years ago. His smoking use included cigarettes. He started smoking about 60 years ago. He has a 66 pack-year smoking history. He has quit using smokeless tobacco. He reports current alcohol use. He reports that he does not use drugs.  No Known Allergies  Family History  Problem Relation Age of Onset   COPD Neg Hx    Diabetes Mellitus II Neg Hx    Hypertension Neg Hx     Prior to Admission medications   Medication Sig Start Date End Date Taking? Authorizing Provider  albuterol (VENTOLIN HFA) 108 (90 Base) MCG/ACT inhaler Inhale 2 puffs into the lungs every 6 (six) hours as needed for wheezing or shortness of breath. 11/22/22   Lorre Munroe, NP  aspirin EC 81 MG tablet Take 1 tablet (81 mg total) by mouth daily. Swallow whole. 11/22/22   Lorre Munroe, NP  donepezil (ARICEPT) 5 MG tablet Take by mouth. 01/04/23 01/04/24  [provider]  Fluticasone-Umeclidin-Vilant (TRELEGY ELLIPTA) 100-62.5-25 MCG/ACT AEPB Inhale 1 puff into the lungs daily. 11/22/22   Lorre Munroe, NP  furosemide (LASIX) 20 MG tablet Take 20 mg by mouth daily.    [provider]  ipratropium-albuterol (DUONEB) 0.5-2.5 (3) MG/3ML SOLN Take 3 mLs by nebulization every 4 (four) hours as needed. Patient not taking: Reported on 02/09/2023 11/22/22   Lorre Munroe, NP  levothyroxine (SYNTHROID) 25 MCG tablet Take 1 tablet (25 mcg total) by mouth daily. 02/14/23   Lorre Munroe, NP  metFORMIN (GLUCOPHAGE) 500 MG tablet Take 1 tablet (500 mg total) by mouth 2 (two) times daily with a meal. 11/22/22   Baity, Salvadore Oxford, NP  OXYGEN Inhale into  the lungs. 3 Liters    [provider]  predniSONE (DELTASONE) 10 MG tablet Take 6 tabs on day 1, 5 tabs on day 2, 4 tabs on day 3, 3 tabs on day 4, 2 tabs on day 5, 1 tab on day 6 12/22/22   Lorre Munroe, NP  promethazine-dextromethorphan (PROMETHAZINE-DM) 6.25-15 MG/5ML syrup Take 5 mLs by mouth 4 (four) times daily as needed. 12/22/22   Lorre Munroe, NP  rosuvastatin (CRESTOR) 10 MG tablet Take 1 tablet (10 mg total) by mouth daily. 11/22/22   Lorre Munroe, NP    Physical Exam: Vitals:   02/19/23 0231 02/19/23 0232 02/19/23 0400  BP: (!) 145/74  124/61  Pulse: (!) 124  (!) 105  Resp: (!) 22  20  Temp: 99.2 F (37.3 C)  (!) 100.8 F (38.2 C)  TempSrc: Oral  Rectal  SpO2: 90%  93%  Weight:  49.9 kg   Height:  5\' 1"  (1.549 m)    Physical Exam Vitals and nursing note reviewed.  Constitutional:      General: He is not in acute distress. HENT:     Head: Normocephalic and atraumatic.  Cardiovascular:     Rate and Rhythm: Regular rhythm. Tachycardia present.     Heart sounds: Normal heart sounds.  Pulmonary:     Effort: Tachypnea present.     Breath sounds: Normal breath sounds.  Abdominal:     Palpations: Abdomen is soft.     Tenderness: There is no abdominal tenderness.  Neurological:     Mental Status: Mental status is at baseline.     Labs on Admission: I have personally reviewed following labs and imaging studies  CBC: Recent Labs  Lab 02/19/23 0237  WBC 14.9*  HGB 14.6  HCT 42.3  MCV 90.8  PLT 234   Basic Metabolic Panel: Recent Labs  Lab 02/19/23 0237  NA 133*  K 3.3*  CL 95*  CO2 23  GLUCOSE 283*  BUN 14  CREATININE 0.94  CALCIUM 9.2   GFR: Estimated Creatinine Clearance: 45 mL/min (by C-G formula based on SCr of 0.94 mg/dL). Liver Function Tests: No results for input(s): "AST", "ALT", "ALKPHOS", "BILITOT", "PROT", "ALBUMIN" in the last 168 hours. No results for input(s): "LIPASE", "AMYLASE" in the last 168 hours. No results for  input(s): "AMMONIA" in the last 168 hours. Coagulation Profile: No results for input(s): "INR", "PROTIME" in the last 168 hours. Cardiac Enzymes: No results for input(s): "CKTOTAL", "CKMB", "CKMBINDEX", "TROPONINI" in the last 168 hours. BNP (last 3 results) No results for input(s): "PROBNP" in the last 8760 hours. HbA1C: No results for input(s): "HGBA1C" in the last 72 hours. CBG: No results for input(s): "GLUCAP" in the last 168 hours. Lipid Profile: No results for input(s): "CHOL", "HDL", "LDLCALC", "TRIG", "CHOLHDL", "LDLDIRECT" in the last 72 hours. Thyroid Function Tests: No results for input(s): "TSH", "T4TOTAL", "FREET4", "T3FREE", "THYROIDAB" in the last 72 hours. Anemia Panel: No results for input(s): "VITAMINB12", "FOLATE", "FERRITIN", "TIBC", "IRON", "RETICCTPCT"  in the last 72 hours. Urine analysis:    Component Value Date/Time   COLORURINE YELLOW (A) 02/19/2023 0345   APPEARANCEUR CLEAR (A) 02/19/2023 0345   APPEARANCEUR Clear 11/04/2020 1355   LABSPEC 1.025 02/19/2023 0345   LABSPEC 1.023 02/01/2014 2216   PHURINE 7.0 02/19/2023 0345   GLUCOSEU >=500 (A) 02/19/2023 0345   GLUCOSEU 50 mg/dL 57/84/6962 9528   HGBUR NEGATIVE 02/19/2023 0345   BILIRUBINUR NEGATIVE 02/19/2023 0345   BILIRUBINUR Negative 11/04/2020 1355   BILIRUBINUR Negative 02/01/2014 2216   KETONESUR 5 (A) 02/19/2023 0345   PROTEINUR 30 (A) 02/19/2023 0345   UROBILINOGEN 0.2 07/10/2019 0845   NITRITE NEGATIVE 02/19/2023 0345   LEUKOCYTESUR TRACE (A) 02/19/2023 0345   LEUKOCYTESUR Negative 02/01/2014 2216    Radiological Exams on Admission: DG Chest 2 View  Result Date: 02/19/2023 CLINICAL DATA:  Chest pain and shortness of breath EXAM: CHEST - 2 VIEW COMPARISON:  09/03/2022 FINDINGS: No change from 09/03/2022. Stable cardiomediastinal silhouette. Low lung volumes accentuate pulmonary vascularity. Calcified granuloma right upper lung. Chronic right upper lobe scarring and volume loss. No focal  consolidation, pleural effusion, or pneumothorax. IMPRESSION: No change from 09/03/2022. Chronic right upper lobe scarring and volume loss. Electronically Signed   By: Minerva Fester M.D.   On: 02/19/2023 03:19     Data Reviewed: Relevant notes from primary care and specialist visits, past discharge summaries as available in EHR, including Care Everywhere. Prior diagnostic testing as pertinent to current admission diagnoses Updated medications and problem lists for reconciliation ED course, including vitals, labs, imaging, treatment and response to treatment Triage notes, nursing and pharmacy notes and ED provider's notes Notable results as noted in HPI   Assessment and Plan: Respiratory tract infection COPD exacerbation Sepsis Chronic respiratory failure Sepsis criteria includes fever, tachycardia and tachypnea with leukocytosis lactic acidosis.  Procalcitonin 0.10 Chest x-ray nonacute per patient had cough and show oddness of breath Rocephin and azithromycin Sepsis fluids Scheduled and as needed DuoNebs Low-dose Solu-Medrol given suspicion for acute infection Supplemental oxygen to keep sats over 92  Essential hypertension Continue furosemide  Diabetes mellitus without complication (HCC) Sliding scale insulin coverage and continue rosuvastatin and aspirin  Mild late onset Alzheimer's dementia without behavioral disturbance, psychotic disturbance, mood disturbance, or anxiety (HCC) Continue Aricept Delirium precautions      DVT prophylaxis: Lovenox  Consults: none  Advance Care Planning:   Code Status: Prior   Family Communication: Daughter at bedside  Disposition Plan: Back to previous home environment  Severity of Illness: The appropriate patient status for this patient is INPATIENT. Inpatient status is judged to be reasonable and necessary in order to provide the required intensity of service to ensure the patient's safety. The patient's presenting symptoms,  physical exam findings, and initial radiographic and laboratory data in the context of their chronic comorbidities is felt to place them at high risk for further clinical deterioration. Furthermore, it is not anticipated that the patient will be medically stable for discharge from the hospital within 2 midnights of admission.   * I certify that at the point of admission it is my clinical judgment that the patient will require inpatient hospital care spanning beyond 2 midnights from the point of admission due to high intensity of service, high risk for further deterioration and high frequency of surveillance required.*  Author: Andris Baumann, MD 02/19/2023 4:46 AM  For on call review [email or web address].

## 2023-02-19 NOTE — Consult Note (Signed)
Pharmacy Antibiotic Note  Jonathan Willis is a 77 y.o. male admitted on 02/19/2023 with COPD.  Pharmacy has been consulted for cefepime dosing.  Plan: Start cefepime 2 grams IV every 12 hours Follow renal function for adjustments  Height: 5\' 1"  (154.9 cm) Weight: 49.9 kg (110 lb) IBW/kg (Calculated) : 52.3  Temp (24hrs), Avg:98.8 F (37.1 C), Min:97.4 F (36.3 C), Max:100.8 F (38.2 C)  Recent Labs  Lab 02/19/23 0237 02/19/23 0345 02/19/23 0520 02/19/23 0807  WBC 14.9*  --   --   --   CREATININE 0.94  --   --   --   LATICACIDVEN  --  3.5* 4.5* 5.3*    Estimated Creatinine Clearance: 45 mL/min (by C-G formula based on SCr of 0.94 mg/dL).    No Known Allergies  Antimicrobials this admission: Cefepime 11/23 >>    Dose adjustments this admission: N/A  Microbiology results: 11/24 BCx: pending 11/24 UCx: pending  11/24 Sputum: ordered  11/24 MRSA PCR: ordered  Thank you for allowing pharmacy to be a part of this patient's care.  Barrie Folk, PharmD 02/19/2023 8:51 AM

## 2023-02-19 NOTE — Assessment & Plan Note (Signed)
Continue Aricept Delirium precautions

## 2023-02-19 NOTE — Assessment & Plan Note (Addendum)
COPD exacerbation Sepsis Chronic respiratory failure Sepsis criteria includes fever, tachycardia and tachypnea with leukocytosis lactic acidosis.  Procalcitonin 0.10 Chest x-ray nonacute per patient had cough and show oddness of breath Rocephin and azithromycin Sepsis fluids Scheduled and as needed DuoNebs Low-dose Solu-Medrol given suspicion for acute infection Supplemental oxygen to keep sats over 92

## 2023-02-19 NOTE — Sepsis Progress Note (Signed)
Reached out to multiple providers and the bedside RN through secure chat about the increase in lactic acid. Suggestions was made about ordering more fluids and getting a repeat lactic acid. Will continue to monitor code sepsis.

## 2023-02-19 NOTE — Evaluation (Addendum)
Occupational Therapy Evaluation Patient Details Name: Jonathan Willis MRN: [id number] DOB: 10-14-1945 Today's Date: 02/19/2023   History of Present Illness Pt admitted to [facility] on 02/19/23 for c/o ongoing chest pain x3 days, fatigue, and SOB. Pt requires 3L O2 via [place] for COPD, but does not always wear it due to Alzheimer's dementia. Work up reveals sepsis, COPD exacerbation, and respiratory tract infection. Significant PMH includes: COPD (noncompliant on 3L), Alz dementia, HTN, and diabetes.   Clinical Impression   Pt was seen for OT/PT evaluation this date. Interpreter via phone: Ghuong ID# [phone]. Pt was alert and oriented x4. Prior to hospital admission, pt reports; indep in ADLs/assist with IADLs, DIL manages medications. Pt lives with son and DIL in their basement (14 steps). Pt presents to acute OT demonstrating impaired ADL performance, and activity tolerance (See OT problem list for additional functional deficits). Pt currently requires set up to donn socks in bed. Pt completed bed mobility with supervision. Pt STS from EOB initially CGA progressed to supervision throughout amb. Pt amb in room/hallway ~178ft with supervision, no DME used. Pt on 2L SpO2 via [place]; elelvated at low 100's with SpO2 at 98%. Pt would benefit from skilled OT services to address noted impairments and functional limitations (see below for any additional details) in order to maximize safety and independence while minimizing falls risk and caregiver burden. OT will follow acutely.      If plan is discharge home, recommend the following: A little help with bathing/dressing/bathroom;Assist for transportation;Direct supervision/assist for medications management;Direct supervision/assist for financial management;Assistance with cooking/housework    Functional Status Assessment  Patient has had a recent decline in their functional status and demonstrates the ability to make significant improvements in function in a reasonable and  predictable amount of time.  Equipment Recommendations  Other (comment) (next venue of care)    Recommendations for Other Services       Precautions / Restrictions Precautions Precautions: Fall Precaution Comments: Droplet Restrictions Weight Bearing Restrictions: No Other Position/Activity Restrictions: Needs vietnamese interpreter      Mobility Bed Mobility Overal bed mobility: Needs Assistance Bed Mobility: Supine to Sit     Supine to sit: Supervision          Transfers Overall transfer level: Needs assistance Equipment used: None Transfers: Sit to/from Stand Sit to Stand: Contact guard assist, Supervision           General transfer comment: Initally CGA for STS, progressed to supervision throughout amb. Pt amb ~129ft with no DME with supervision, no reports of SOB or LOB noted during amb.      Balance Overall balance assessment: Needs assistance Sitting-balance support: No upper extremity supported, Feet supported Sitting balance-Leahy Scale: Normal     Standing balance support: No upper extremity supported, During functional activity Standing balance-Leahy Scale: Good                             ADL either performed or assessed with clinical judgement   ADL Overall ADL's : Needs assistance/impaired     Grooming: Wash/dry face;Sitting;Set up               Lower Body Dressing: Bed level;Set up Lower Body Dressing Details (indicate cue type and reason): Donned socks Toilet Transfer: Supervision/safety;Ambulation Toilet Transfer Details (indicate cue type and reason): simulated         Functional mobility during ADLs: Supervision/safety       Vision  Perception Perception: Within Functional Limits       Praxis Praxis: WFL       Pertinent Vitals/Pain Pain Assessment Pain Assessment: No/denies pain     Extremity/Trunk Assessment Upper Extremity Assessment Upper Extremity Assessment: Overall WFL for tasks  assessed   Lower Extremity Assessment Lower Extremity Assessment: Defer to PT evaluation;Overall [facility] for tasks assessed   Cervical / Trunk Assessment Cervical / Trunk Assessment: Normal   Communication Communication Communication: Other (comment) Cueing Techniques: Verbal cues   Cognition Arousal: Alert Behavior During Therapy: WFL for tasks assessed/performed Overall Cognitive Status: No family/caregiver present to determine baseline cognitive functioning                                 General Comments: pt alert and oriented x4     General Comments  Pt on 2L SpO2 via [place]; elelvated at low 100's with SpO2 at 98%. Pt reported chest discomfort during amb stating not radiating into shoulder/jaw    Exercises Other Exercises Other Exercises: Edu: role of OT, role of home health   Shoulder Instructions      Home Living Family/patient expects to be discharged to:: Private residence Living Arrangements: Children;Other relatives Available Help at Discharge: Family Type of Home: House Home Access: Level entry     Home Layout: Multi-level Alternate Level Stairs-Number of Steps: flight- 14   Bathroom Shower/Tub: Producer, television/film/video: Standard     Home Equipment: None   Additional Comments: Pt confirmed the use of a bamboo walking stick when out in his yard      Prior Functioning/Environment Prior Level of Function : Needs assist       Physical Assist : ADLs (physical)   ADLs (physical): IADLs Mobility Comments: amb with bamboo stick in the yard, no AD in the house ADLs Comments: pt reports indep in ADLs, assist for IADLs, DIL manages medications        OT Problem List: Decreased safety awareness;Impaired balance (sitting and/or standing);Decreased cognition;Decreased activity tolerance      OT Treatment/Interventions: Self-care/ADL training;Therapeutic exercise;Patient/family education;Balance training;Energy conservation;DME and/or AE  instruction;Cognitive remediation/compensation;Therapeutic activities    OT Goals(Current goals can be found in the care plan section) Acute Rehab OT Goals Patient Stated Goal: to return home OT Goal Formulation: With patient Time For Goal Achievement: 03/05/23 Potential to Achieve Goals: Good ADL Goals Pt Will Perform Grooming: Independently;standing Pt Will Perform Lower Body Dressing: Independently;sit to/from stand Pt Will Transfer to Toilet: Independently;ambulating;regular height toilet Pt Will Perform Toileting - Clothing Manipulation and hygiene: Independently;sit to/from stand  OT Frequency: Min 1X/week    Co-evaluation PT/OT/SLP Co-Evaluation/Treatment: Yes Reason for Co-Treatment: Necessary to address cognition/behavior during functional activity   OT goals addressed during session: ADL's and self-care      AM-PAC OT "6 Clicks" Daily Activity     Outcome Measure Help from another person eating meals?: None Help from another person taking care of personal grooming?: None Help from another person toileting, which includes using toliet, bedpan, or urinal?: A Little Help from another person bathing (including washing, rinsing, drying)?: A Little Help from another person to put on and taking off regular upper body clothing?: None Help from another person to put on and taking off regular lower body clothing?: None 6 Click Score: 22   End of Session Equipment Utilized During Treatment: Oxygen Nurse Communication: Mobility status  Activity Tolerance: Patient tolerated treatment well Patient left: in  chair;with call bell/phone within reach;with chair alarm set  OT Visit Diagnosis: Other abnormalities of gait and mobility (R26.89)                Time: 8469-6295 OT Time Calculation (min): 26 min Charges:  OT General Charges $OT Visit: 1 Visit OT Evaluation $OT Eval Moderate Complexity: 1 Mod  [employer], OTS

## 2023-02-19 NOTE — Assessment & Plan Note (Signed)
Continue furosemide

## 2023-02-19 NOTE — ED Triage Notes (Signed)
Pt is [place] speaking needs interpreter. Daughter in law with pt.  Pt reports chest pain for the past 3 days. Has COPD on 3L/ [place] at all times. Pt has nasal congestion, and cough.  Per Daughter in law pt has Alzheimer disease.

## 2023-02-19 NOTE — Sepsis Progress Note (Signed)
Elink following code sepsis

## 2023-02-19 NOTE — Progress Notes (Signed)
Initial Nutrition Assessment  DOCUMENTATION CODES:   Not applicable  INTERVENTION:  Liberalize diet Ensure Plus High Protein po BID, each supplement provides 350 kcal and 20 grams of protein. Multivitamin/minerals    NUTRITION DIAGNOSIS:   Increased nutrient needs related to chronic illness as evidenced by estimated needs.    GOAL:   Patient will meet greater than or equal to 90% of their needs    MONITOR:   PO intake, Supplement acceptance, Weight trends  REASON FOR ASSESSMENT:   Consult Assessment of nutrition requirement/status  ASSESSMENT:   77 y.o. M, admitted diabetes mellitus without complications.  PMH; Dementia, COPD noncompliant with oxygen, HTN, DM, hx of TB, lower GI bleed. Upon admission pt was noted to respiratory infection, COPD exacerbation, Sepsis, Chronic respiratory failure.   Unable to visit with pt on this day.  NO meal history available.  Reached out to family with no answer. All information obtained through EMR and team.  EMR revealed pt/ot following. No noted chewing or swallowing concerns noted at this time. Self feeding. Weight stable. Suspect pt at risk for malnutrition although unable to verify if he meets criteria due to remote assessment. There is no benefit to excessive dietary restrictions related advanced age, increased nutrient needs . Patient would benefit better from liberalized diet to help meet increased nutrient needs and promote better oral intake.  Admit weight: 49.9 kg Current weight: 49.9 kg  Weight history: 02/19/23 49.9 kg  02/09/23 49.2 kg  12/22/22 49.9 kg  11/22/22 49.4 kg  09/03/22 49.6 kg  08/01/22 49.9 kg  07/19/22 50.2 kg  04/27/22 51.3 kg  04/26/22 51.3 kg  04/23/22 49.9 kg    Average Meal Intake: Pt has been NPO with change to heart healthy/carb modified at 0450 today. Meal % pending new diet order.   Nutritionally Relevant Medications: Scheduled Meds:  donepezil  5 mg Oral QHS    Labs  Reviewed: HgbA1c 7.9 11/22/22    NUTRITION - FOCUSED PHYSICAL EXAM:    Diet Order:   Diet Order             Diet heart healthy/carb modified Room service appropriate? Yes; Fluid consistency: Thin  Diet effective now                   EDUCATION NEEDS:   Not appropriate for education at this time  Skin:  Skin Assessment: Reviewed RN Assessment  Last BM:  PTA  Height:   Ht Readings from Last 1 Encounters:  02/19/23 5\' 1"  (1.549 m)    Weight:   Wt Readings from Last 1 Encounters:  02/19/23 49.9 kg    Ideal Body Weight:     BMI:  Body mass index is 20.78 kg/m.  Estimated Nutritional Needs:   Kcal:  1500-1750 kcal/d  Protein:  65-100 g/d  Fluid:  74ml/kcal    Jamelle Haring RDN, LDN Clinical Dietitian  RDN pager # available on Amion

## 2023-02-19 NOTE — Progress Notes (Signed)
CODE SEPSIS - PHARMACY COMMUNICATION  **Broad Spectrum Antibiotics should be administered within 1 hour of Sepsis diagnosis**  Time Code Sepsis Called/Page Received: 11/24 @ 0404   Antibiotics Ordered: Vancomycin, Cefepime, metronidazole   Time of 1st antibiotic administration: Vancomycin 1 gm IV X 1 on 11/24 @ 0409   Additional action taken by pharmacy:   If necessary, Name of Provider/Nurse Contacted:     Rayleen Wyrick D ,PharmD Clinical Pharmacist  02/19/2023  4:16 AM

## 2023-02-19 NOTE — ED Provider Notes (Signed)
[facility] Provider Note    Event Date/Time   First MD Initiated Contact with Patient 02/19/23 [phone]     (approximate)   History   Chest Pain and Shortness of Breath   HPI  Jonathan Willis is a 77 y.o. male history of dementia, COPD, hypertension, diabetes who presents to the emergency department with several days of not feeling well, sleeping more than normal and complaining of chest pain, shortness of breath tonight.  He is supposed to wear oxygen at home but does not wear it due to his Alzheimer's.  No vomiting or diarrhea.  Family is not sure if he has had any fever.  They report he has been coughing more than normal.   History provided by patient's daughter-in-law at bedside, level 5 caveat secondary to dementia.    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   History of kidney stones    History of TB (tuberculosis)    Hypertension    Lower GI bleed    Tuberculosis 1995    Past Surgical History:  Procedure Laterality Date   BRONCHIAL WASHINGS N/A 04/27/2022   Procedure: BRONCHIAL WASHINGS;  Surgeon: Vida Rigger, MD;  Location: [facility] ORS;  Service: Thoracic;  Laterality: N/A;   COLONOSCOPY WITH PROPOFOL N/A 04/10/2017   Procedure: COLONOSCOPY WITH PROPOFOL;  Surgeon: Wyline Mood, MD;  Location: [facility];  Service: Gastroenterology;  Laterality: N/A;   NO PAST SURGERIES     RIGID BRONCHOSCOPY N/A 04/27/2022   Procedure: RIGID BRONCHOSCOPY;  Surgeon: Vida Rigger, MD;  Location: [facility] ORS;  Service: Thoracic;  Laterality: N/A;    MEDICATIONS:  Prior to Admission medications   Medication Sig Start Date End Date Taking? Authorizing Provider  albuterol (VENTOLIN HFA) 108 (90 Base) MCG/ACT inhaler Inhale 2  puffs into the lungs every 6 (six) hours as needed for wheezing or shortness of breath. 11/22/22   Lorre Munroe, NP  aspirin EC 81 MG tablet Take 1 tablet (81 mg total) by mouth daily. Swallow whole. 11/22/22   Lorre Munroe, NP  donepezil (ARICEPT) 5 MG tablet Take by mouth. 01/04/23 01/04/24  [provider]  Fluticasone-Umeclidin-Vilant (TRELEGY ELLIPTA) 100-62.5-25 MCG/ACT AEPB Inhale 1 puff into the lungs daily. 11/22/22   Lorre Munroe, NP  furosemide (LASIX) 20 MG tablet Take 20 mg by mouth daily.    [provider]  ipratropium-albuterol (DUONEB) 0.5-2.5 (3) MG/3ML SOLN Take 3 mLs by nebulization every 4 (four) hours as needed. Patient not taking: Reported on 02/09/2023 11/22/22   Lorre Munroe, NP  levothyroxine (SYNTHROID) 25 MCG tablet Take 1 tablet (25 mcg total) by mouth daily. 02/14/23   Lorre Munroe, NP  metFORMIN (GLUCOPHAGE) 500 MG tablet Take 1 tablet (500 mg total) by mouth 2 (two) times daily with a meal. 11/22/22   Baity, Salvadore Oxford, NP  OXYGEN Inhale into the lungs. 3 Liters    [provider]  predniSONE (DELTASONE) 10 MG tablet Take 6 tabs on day 1, 5 tabs on day 2, 4 tabs on day 3, 3 tabs on day 4, 2 tabs on day 5, 1 tab on day 6 12/22/22   Lorre Munroe, NP  promethazine-dextromethorphan (PROMETHAZINE-DM) 6.25-15 MG/5ML syrup Take 5 mLs by mouth  4 (four) times daily as needed. 12/22/22   Lorre Munroe, NP  rosuvastatin (CRESTOR) 10 MG tablet Take 1 tablet (10 mg total) by mouth daily. 11/22/22   Lorre Munroe, NP    Physical Exam   Triage Vital Signs: ED Triage Vitals  Encounter Vitals Group     BP 02/19/23 0231 (!) 145/74     Systolic BP Percentile --      Diastolic BP Percentile --      Pulse Rate 02/19/23 0231 (!) 124     Resp 02/19/23 0231 (!) 22     Temp 02/19/23 0231 99.2 F (37.3 C)     Temp Source 02/19/23 0231 Oral     SpO2 02/19/23 0231 90 %     Weight 02/19/23 0232 110 lb (49.9 kg)     Height 02/19/23 0232 5\' 1"   (1.549 m)     Head Circumference --      Peak Flow --      Pain Score 02/19/23 0232 4     Pain Loc --      Pain Education --      Exclude from Growth Chart --     Most recent vital signs: Vitals:   02/19/23 0231 02/19/23 0400  BP: (!) 145/74 124/61  Pulse: (!) 124 (!) 105  Resp: (!) 22 20  Temp: 99.2 F (37.3 C) (!) 100.8 F (38.2 C)  SpO2: 90% 93%    CONSTITUTIONAL: Alert, responds appropriately to questions.  Elderly, in no distress HEAD: Normocephalic, atraumatic EYES: Conjunctivae clear, pupils appear equal, sclera nonicteric ENT: normal nose; moist mucous membranes NECK: Supple, normal ROM CARD: Regular and tachycardic; S1 and S2 appreciated RESP: Normal chest excursion without splinting or tachypnea; occasional wheezing appreciated, no rhonchi, no rales, no hypoxia or respiratory distress, speaking full sentences ABD/GI: Non-distended; soft, non-tender, no rebound, no guarding, no peritoneal signs BACK: The back appears normal EXT: Normal ROM in all joints; no deformity noted, no edema, no calf tenderness or calf swelling SKIN: Normal color for age and race; warm; no rash on exposed skin NEURO: Moves all extremities equally, normal speech PSYCH: The patient's mood and manner are appropriate.   ED Results / Procedures / Treatments   LABS: (all labs ordered are listed, but only abnormal results are displayed) Labs Reviewed  BASIC METABOLIC PANEL - Abnormal; Notable for the following components:      Result Value   Sodium 133 (*)    Potassium 3.3 (*)    Chloride 95 (*)    Glucose, Bld 283 (*)    All other components within normal limits  CBC - Abnormal; Notable for the following components:   WBC 14.9 (*)    All other components within normal limits  URINALYSIS, ROUTINE W REFLEX MICROSCOPIC - Abnormal; Notable for the following components:   Color, Urine YELLOW (*)    APPearance CLEAR (*)    Glucose, UA >=500 (*)    Ketones, ur 5 (*)    Protein, ur 30 (*)     Leukocytes,Ua TRACE (*)    All other components within normal limits  LACTIC ACID, PLASMA - Abnormal; Notable for the following components:   Lactic Acid, Venous 3.5 (*)    All other components within normal limits  RESP PANEL BY RT-PCR (RSV, FLU A&B, COVID)  RVPGX2  CULTURE, BLOOD (ROUTINE X 2)  CULTURE, BLOOD (ROUTINE X 2)  D-DIMER, QUANTITATIVE  PROCALCITONIN  LACTIC ACID, PLASMA  HIV ANTIBODY (ROUTINE TESTING W REFLEX)  TROPONIN I (HIGH SENSITIVITY)  TROPONIN I (HIGH SENSITIVITY)     EKG:  EKG Interpretation Date/Time:  Sunday February 19 2023 02:33:03 EST Ventricular Rate:  120 PR Interval:  132 QRS Duration:  60 QT Interval:  336 QTC Calculation: 474 R Axis:   53  Text Interpretation: Sinus tachycardia ST & T wave abnormality, consider inferior ischemia Abnormal ECG When compared with ECG of 03-Sep-2022 22:14, Vent. rate has increased BY  43 BPM Non-specific change in ST segment in Inferior leads T wave inversion now evident in Lateral leads Confirmed by Nathanyl Andujo, Baxter Hire [id number]) on 02/19/2023 3:22:17 AM         RADIOLOGY: My personal review and interpretation of imaging: Chest x-ray clear  I have personally reviewed all radiology reports.   DG Chest 2 View  Result Date: 02/19/2023 CLINICAL DATA:  Chest pain and shortness of breath EXAM: CHEST - 2 VIEW COMPARISON:  09/03/2022 FINDINGS: No change from 09/03/2022. Stable cardiomediastinal silhouette. Low lung volumes accentuate pulmonary vascularity. Calcified granuloma right upper lung. Chronic right upper lobe scarring and volume loss. No focal consolidation, pleural effusion, or pneumothorax. IMPRESSION: No change from 09/03/2022. Chronic right upper lobe scarring and volume loss. Electronically Signed   By: Minerva Fester M.D.   On: 02/19/2023 03:19     PROCEDURES:  Critical Care performed: Yes, see critical care procedure note(s)   CRITICAL CARE Performed by: Baxter Hire Kohner Orlick   Total critical care time: 35  minutes  Critical care time was exclusive of separately billable procedures and treating other patients.  Critical care was necessary to treat or prevent imminent or life-threatening deterioration.  Critical care was time spent personally by me on the following activities: development of treatment plan with patient and/or surrogate as well as nursing, discussions with consultants, evaluation of patient's response to treatment, examination of patient, obtaining history from patient or surrogate, ordering and performing treatments and interventions, ordering and review of laboratory studies, ordering and review of radiographic studies, pulse oximetry and re-evaluation of patient's condition.   Marland Kitchen1-3 Lead EKG Interpretation  Performed by: Codie Krogh, Layla Maw, DO Authorized by: Saleema Weppler, Layla Maw, DO     Interpretation: abnormal     ECG rate:  124   ECG rate assessment: tachycardic     Rhythm: sinus tachycardia     Ectopy: none     Conduction: normal       IMPRESSION / MDM / ASSESSMENT AND PLAN / ED COURSE  I reviewed the triage vital signs and the nursing notes.    Patient here with chest pain, shortness of breath.  He is tachycardic and tachypneic.  Low-grade oral temperature of 99.2.  Increased cough from baseline.  The patient is on the cardiac monitor to evaluate for evidence of arrhythmia and/or significant heart rate changes.   DIFFERENTIAL DIAGNOSIS (includes but not limited to):   COPD exacerbation, PE, ACS, pneumonia, viral URI, anemia, electrolyte derangement, dehydration, UTI   Patient's presentation is most consistent with acute presentation with potential threat to life or bodily function.   PLAN: Will obtain labs, cultures, chest x-ray, urine, COVID and flu swab.  Will give breathing treatments, Solu-Medrol.  Will check rectal temperature.   MEDICATIONS GIVEN IN ED: Medications  metroNIDAZOLE (FLAGYL) IVPB 500 mg (500 mg Intravenous New Bag/Given 02/19/23 0410)   vancomycin (VANCOCIN) IVPB 1000 mg/200 mL premix (1,000 mg Intravenous New Bag/Given 02/19/23 0409)  lactated ringers bolus 1,000 mL (has no administration in time range)  rosuvastatin (CRESTOR) tablet 10 mg (has no  administration in time range)  furosemide (LASIX) tablet 20 mg (has no administration in time range)  aspirin EC tablet 81 mg (has no administration in time range)  donepezil (ARICEPT) tablet 5 mg (has no administration in time range)  methylPREDNISolone sodium succinate (SOLU-MEDROL) 40 mg/mL injection 40 mg (has no administration in time range)    Followed by  predniSONE (DELTASONE) tablet 40 mg (has no administration in time range)  ipratropium-albuterol (DUONEB) 0.5-2.5 (3) MG/3ML nebulizer solution 3 mL (has no administration in time range)  albuterol (PROVENTIL) (2.5 MG/3ML) 0.083% nebulizer solution 2.5 mg (has no administration in time range)  enoxaparin (LOVENOX) injection 40 mg (has no administration in time range)  cefTRIAXone (ROCEPHIN) 2 g in sodium chloride 0.9 % 100 mL IVPB (has no administration in time range)  azithromycin (ZITHROMAX) 500 mg in sodium chloride 0.9 % 250 mL IVPB (has no administration in time range)  acetaminophen (TYLENOL) tablet 650 mg (has no administration in time range)    Or  acetaminophen (TYLENOL) suppository 650 mg (has no administration in time range)  ondansetron (ZOFRAN) tablet 4 mg (has no administration in time range)    Or  ondansetron (ZOFRAN) injection 4 mg (has no administration in time range)  insulin aspart (novoLOG) injection 0-9 Units (has no administration in time range)  insulin aspart (novoLOG) injection 0-5 Units (has no administration in time range)  albuterol (PROVENTIL) (2.5 MG/3ML) 0.083% nebulizer solution 2.5 mg (2.5 mg Nebulization Given 02/19/23 0243)  ipratropium-albuterol (DUONEB) 0.5-2.5 (3) MG/3ML nebulizer solution 3 mL (3 mLs Nebulization Given 02/19/23 0403)  methylPREDNISolone sodium succinate  (SOLU-MEDROL) 125 mg/2 mL injection 125 mg (125 mg Intravenous Given 02/19/23 0403)  acetaminophen (TYLENOL) tablet 1,000 mg (1,000 mg Oral Given 02/19/23 0409)  ceFEPIme (MAXIPIME) 2 g in sodium chloride 0.9 % 100 mL IVPB (2 g Intravenous New Bag/Given 02/19/23 0409)  lactated ringers bolus 1,000 mL (1,000 mLs Intravenous New Bag/Given 02/19/23 0419)     ED COURSE: Patient's breathing is improving with breathing treatments.  Heart rate and respiratory rate also improving.  Rectal temp of 100.8.  Labs show leukocytosis of 14,000.  Lactic elevated to 3.5.  Getting 30 mL/kg IV fluid bolus.  COVID, flu and RSV negative.  Chest x-ray reviewed and interpreted by myself and radiologist and is clear however suspect that his source is respiratory in nature.  D-dimer and troponin negative.  Urine shows no infection.  Will discuss with hospitalist for admission.   CONSULTS:  Consulted and discussed patient's case with hospitalist, Dr. Para March.  I have recommended admission and consulting physician agrees and will place admission orders.  Patient (and family if present) agree with this plan.   I reviewed all nursing notes, vitals, pertinent previous records.  All labs, EKGs, imaging ordered have been independently reviewed and interpreted by myself.    OUTSIDE RECORDS REVIEWED: Reviewed last neurology note on 01/04/2023.       FINAL CLINICAL IMPRESSION(S) / ED DIAGNOSES   Final diagnoses:  Shortness of breath  Chest pain, unspecified type  COPD exacerbation (HCC)  Acute sepsis (HCC)     Rx / DC Orders   ED Discharge Orders     None        Note:  This document was prepared using Dragon voice recognition software and may include unintentional dictation errors.   Elva Mauro, Layla Maw, DO 02/19/23 [phone]

## 2023-02-19 NOTE — Progress Notes (Addendum)
Interim progress note not for billing.  I have seen and examined the patient, reviewed the chart. I agree with the h and p unless otherwise stipulated.  Hx dementia and cpfe, reportedly on 3 liters at home but not compliant with that, presenting with one week increased cough and debility. Here cxr without focal findings but febrile and with elevated lactate. Hemodynamically stable. Covid/flu/rsv neg. Currently he is sitting up in bed, not encephalopathic, no neck pain or stiffness, breathing comfortably, satting normally on room air, eating breakfast.   Given his advanced cpfe I will broaden abx to cefepime. Will check mrsa swab. Will continue copd treatment with steroids. Will check respiratory viral panel as think quite possible this is a viral infection. Will also check urine antigens and sputum culture if he can provide it. Will monitor urine and blood cultures. Think meningitis unlikely given lack of encephalopathy and lack of headache and neck pain/stiffness. Will continue fluid resuscitation, currently getting another bolus and then will get maintenance fluids. Will repeat a lactate this afternoon. Will stop the lasix that's been ordered given he's septic and we are fluid resuscitating.  It appears he has a history of persistently elevated lactic acid and his lactate hasn't improved with fluid resuscitation. He is on metformin at home so may be worth holding this at discharge. His glucose is elevated but bicarb and gap are normal so do not think dka. Will check vbg to see if actually acidotic. No known history of malabsorption. No known malignancy.  Update: rhinovirus positive, will stop abx, will also stop steroids as is very hyperglycemic.

## 2023-02-19 NOTE — TOC Initial Note (Signed)
Transition of Care [facility]) - Initial/Assessment Note    Patient Details  Name: Jonathan Willis MRN: [id number] Date of Birth: 1945-04-12  Transition [facility]) CM/SW Contact:    Jonathan Cline, LCSW Phone Number: 02/19/2023, 4:43 PM  Clinical Narrative:                 Called and spoke with patient's daughter Jonathan Willis regarding Home Health recommendations. Patient is from home with Jonathan Willis/family. Jonathan Willis provides transportation. PCP is [facility] [place]. Patient is on 3L of oxygen at home, no other DME at home per [place]. Jonathan Willis declines Home Health. She states patient is very active and she does not feel he needs it, she agreed to let staff know if they change their mind prior to discharge. Jonathan Willis denies additional TOC needs at this point.  Expected Discharge Plan: Home/Self Care Barriers to Discharge: Continued Medical Work up   Patient Goals and CMS Choice   CMS Medicare.gov Compare Post Acute Care list provided to:: Patient Represenative (must comment) Choice offered to / list presented to : Adult Children      Expected Discharge Plan and Services       Living arrangements for the past 2 months: Single Family Home                                      Prior Living Arrangements/Services Living arrangements for the past 2 months: Single Family Home Lives with:: Relatives, Adult Children Patient language and need for interpreter reviewed:: Yes (checked with MD, family does not need interpreter. Patient with Dementia at baseline.)        Need for Family Participation in Patient Care: Yes (Comment) Care giver support system in place?: Yes (comment) Current home services: DME Criminal Activity/Legal Involvement Pertinent to Current Situation/Hospitalization: No - Comment as needed  Activities of Daily Living   ADL Screening (condition at time of admission) Independently performs ADLs?: Yes (appropriate for developmental age) Is the patient deaf or have  difficulty hearing?: No Does the patient have difficulty seeing, even when wearing glasses/contacts?: No Does the patient have difficulty concentrating, remembering, or making decisions?: Yes  Permission Sought/Granted                  Emotional Assessment         Alcohol / Substance Use: Not Applicable Psych Involvement: No (comment)  Admission diagnosis:  Shortness of breath [R06.02] COPD exacerbation (HCC) [J44.1] Sepsis (HCC) [A41.9] Chest pain, unspecified type [R07.9] Acute sepsis [facility]) [A41.9] Patient Active Problem List   Diagnosis Date Noted   Respiratory tract infection 02/19/2023   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   Mild late onset Alzheimer's dementia without behavioral disturbance, psychotic disturbance, mood disturbance, or anxiety (HCC) 02/09/2023   COPD (chronic obstructive pulmonary disease) (HCC) 04/23/2022   COPD exacerbation (HCC) 04/23/2022   Severe sepsis (HCC) 02/20/2021   Diabetes mellitus without complication (HCC)    Aortic atherosclerosis (HCC) 09/22/2020   Carotid stenosis 07/01/2017   BPH (benign prostatic hyperplasia) 03/23/2017   Chronic respiratory failure (HCC) 01/21/2017   Essential hypertension 01/16/2017   Dyslipidemia associated with type 2 diabetes mellitus (HCC) 01/16/2017   PCP:  Jonathan Munroe, NP [facility]acy[phone] [place] [place] - [address] [place] [address] Phone: [phone] Fax: [phone]     Social Determinants  of Health (SDOH) Social History: SDOH Screenings   Food Insecurity: No Food Insecurity (02/19/2023)  Housing: Low Risk  (02/19/2023)  Transportation Needs: No Transportation Needs (02/19/2023)  Utilities: Not At Risk (02/19/2023)  Alcohol Screen: Low Risk  (03/04/2022)  Depression (PHQ2-9): Low Risk  (02/09/2023)  Financial Resource Strain: High Risk (03/04/2022)  Physical Activity: Insufficiently Active (03/04/2022)  Social Connections:  Moderately Isolated (03/04/2022)  Stress: No Stress Concern Present (03/04/2022)  Tobacco Use: Medium Risk (02/09/2023)   SDOH Interventions:     Readmission Risk Interventions    02/19/2023    4:42 PM  Readmission Risk Prevention Plan  Post Dischage Appt Complete  Medication Screening Complete  Transportation Screening Complete

## 2023-02-19 NOTE — Assessment & Plan Note (Signed)
Sliding scale insulin coverage and continue rosuvastatin and aspirin

## 2023-02-20 DIAGNOSIS — B348 Other viral infections of unspecified site: Secondary | ICD-10-CM

## 2023-02-20 LAB — GLUCOSE, CAPILLARY
Glucose-Capillary: 178 mg/dL — ABNORMAL HIGH (ref 70–99)
Glucose-Capillary: 226 mg/dL — ABNORMAL HIGH (ref 70–99)
Glucose-Capillary: 241 mg/dL — ABNORMAL HIGH (ref 70–99)
Glucose-Capillary: 267 mg/dL — ABNORMAL HIGH (ref 70–99)
Glucose-Capillary: 269 mg/dL — ABNORMAL HIGH (ref 70–99)
Glucose-Capillary: 272 mg/dL — ABNORMAL HIGH (ref 70–99)
Glucose-Capillary: 342 mg/dL — ABNORMAL HIGH (ref 70–99)

## 2023-02-20 LAB — BASIC METABOLIC PANEL
Anion gap: 6 (ref 5–15)
BUN: 22 mg/dL (ref 8–23)
CO2: 24 mmol/L (ref 22–32)
Calcium: 8.5 mg/dL — ABNORMAL LOW (ref 8.9–10.3)
Chloride: 107 mmol/L (ref 98–111)
Creatinine, Ser: 0.91 mg/dL (ref 0.61–1.24)
GFR, Estimated: >60 mL/min (ref >60)
Glucose, Bld: 326 mg/dL — ABNORMAL HIGH (ref 70–99)
Potassium: 3.6 mmol/L (ref 3.5–5.1)
Sodium: 137 mmol/L (ref 135–145)

## 2023-02-20 LAB — CBC
HCT: 31.6 % — ABNORMAL LOW (ref 39.0–52.0)
Hemoglobin: 10.8 g/dL — ABNORMAL LOW (ref 13.0–17.0)
MCH: 31.2 pg (ref 26.0–34.0)
MCHC: 34.2 g/dL (ref 30.0–36.0)
MCV: 91.3 fL (ref 80.0–100.0)
Platelets: 186 10*3/uL (ref 150–400)
RBC: 3.46 MIL/uL — ABNORMAL LOW (ref 4.22–5.81)
RDW: 12.9 % (ref 11.5–15.5)
WBC: 14.2 10*3/uL — ABNORMAL HIGH (ref 4.0–10.5)
nRBC: 0 % (ref 0.0–0.2)

## 2023-02-20 LAB — HIV ANTIBODY (ROUTINE TESTING W REFLEX): HIV Screen 4th Generation wRfx: NONREACTIVE

## 2023-02-20 MED ORDER — BUDESONIDE 0.25 MG/2ML IN SUSP
0.2500 mg | Freq: Two times a day (BID) | RESPIRATORY_TRACT | Status: DC
  Administered 2023-02-20 – 2023-02-21 (×2): 0.25 mg via RESPIRATORY_TRACT
  Filled 2023-02-20 (×2): qty 2

## 2023-02-20 MED ORDER — INSULIN ASPART 100 UNIT/ML IJ SOLN
0.0000 [IU] | Freq: Three times a day (TID) | INTRAMUSCULAR | Status: DC
  Administered 2023-02-20: 8 [IU] via SUBCUTANEOUS
  Administered 2023-02-21: 2 [IU] via SUBCUTANEOUS
  Administered 2023-02-21: 11 [IU] via SUBCUTANEOUS
  Filled 2023-02-20 (×3): qty 1

## 2023-02-20 MED ORDER — INSULIN GLARGINE-YFGN 100 UNIT/ML ~~LOC~~ SOLN
10.0000 [IU] | Freq: Every day | SUBCUTANEOUS | Status: DC
  Administered 2023-02-20: 10 [IU] via SUBCUTANEOUS
  Filled 2023-02-20 (×2): qty 0.1

## 2023-02-20 MED ORDER — GLUCERNA SHAKE PO LIQD
237.0000 mL | Freq: Two times a day (BID) | ORAL | Status: DC
  Administered 2023-02-21: 237 mL via ORAL

## 2023-02-20 NOTE — Inpatient Diabetes Management (Signed)
Inpatient Diabetes Program Recommendations  AACE/ADA: New Consensus Statement on Inpatient Glycemic Control (2015)  Target Ranges:  Prepandial:   less than 140 mg/dL      Peak postprandial:   less than 180 mg/dL (1-2 hours)      Critically ill patients:  140 - 180 mg/dL    Latest Reference Range & Units 02/19/23 08:14 02/19/23 11:33 02/19/23 17:10 02/19/23 18:35 02/19/23 19:49 02/19/23 22:23  Glucose-Capillary 70 - 99 mg/dL  161 mg Solumedrol @0403  394 (H)  9 units Novolog  497 (H)  15 units Novolog @1318   40 mg Solumedrol @1557  595 (HH)  15 units Novolog @1734   5 units Semglee @1733  347 (H)  11 units Novolog  289 (H) 108 (H)  (HH): Data is critically high (H): Data is abnormally high  Latest Reference Range & Units 02/20/23 00:55 02/20/23 04:06 02/20/23 05:51  Glucose-Capillary 70 - 99 mg/dL 096 (H)  5 units Novolog  342 (H) 269 (H)  8 units Novolog   (H): Data is abnormally high    Admit with:  Respiratory tract infection COPD exacerbation Sepsis Chronic respiratory failure  History: DM2, Dementia  Home DM Meds: Metformin 500 mg BID  Current Orders: Semglee 20 units at bedtime     Novolog Moderate Correction Scale/ SSI (0-15 units) Q4 hours    MD- Note pt received 2 doses Solumedrol yesterday (125 mg at 4am and 40 mg at 4pm)  CBGs yest afternoon severely elevated likely from the steroids  Steroids have been stopped--Note Semglee dose raised to 20 units at bedtime for tonight--Concern this may be too much given pt weight only 49 kg  Please consider reducing the Semglee to 10 units at bedtime (0.2 units/kg)    --Will follow patient during hospitalization--  Ambrose Finland RN, MSN, CDCES Diabetes Coordinator Inpatient Glycemic Control Team Team Pager: [phone] (8a-5p)

## 2023-02-20 NOTE — Progress Notes (Signed)
Progress Note   Patient: Jonathan Willis DOB: October 11, 1945 DOA: 02/19/2023     1 DOS: the patient was seen and examined on 02/20/2023   Brief hospital course:  Jonathan Willis is a 77 y.o. male with medical history significant for Dementia, COPD noncompliant with oxygen 3L, HTN and diabetes presenting with a several day history of generalized malaise, somnolence and lethargy.  He developed chest pain and shortness of breath on the night of arrival.  No fevers or chills.  Spoke with daughter at bedside as patient speaks [place] only. ED course and data review: Febrile at 100.8, pulse 124 and respirations 22 with O2 sat 90 to 93% on room air. Labs with WBC 15,000 and lactic acid 3.5, procalcitonin 0.12 Troponin 6, D-dimer 0.49 which is normal Potassium 3.3, glucose 283 Urinalysis with trace leukocyte esterase EKG, personally viewed and interpreted showing sinus tachycardia at 120 Chest x-ray nonacute shows chronic right upper lobe scarring Patient treated with DuoNeb and Solu-Medrol started on cefepime and vancomycin for sepsis of suspected respiratory source Hospitalist consulted for admission.       Assessment and Plan:   COPD with acute exacerbation Chronic respiratory failure Rhinovirus infection Sepsis Patient presents to the ER for evaluation of shortness of breath and a nonproductive cough. Sepsis criteria includes fever, tachycardia, tachypnea, leukocytosis and lactic acidosis.  Procalcitonin 0.10 Chest x-ray was  non acute Respiratory viral panel is positive for rhinovirus Continue empiric antibiotic therapy with Rocephin and azithromycin Continue scheduled and as needed DuoNebs Start patient on inhaled steroids Supplemental oxygen to keep sats over 92.  Patient is on 3 L of oxygen at home    Essential hypertension Blood pressure is stable    Diabetes mellitus with hyperglycemia (HCC) Continue Semglee 10 units at bedtime with sliding scale coverage Hold  glipizide Hold systemic steroids which are contributing to hyperglycemia Continue consistent carbohydrate diet    Mild late onset Alzheimer's dementia without behavioral disturbance, psychotic disturbance, mood disturbance, or anxiety (HCC) Continue Aricept        Subjective: Patient is seen and examined at the bedside using the tele intepreter  Physical Exam: Vitals:   02/19/23 1658 02/19/23 1959 02/20/23 0054 02/20/23 0847  BP: (!) 149/65  121/71 (!) 111/49  Pulse: 87  73 99  Resp: 18  18 18   Temp: (!) 97.5 F (36.4 C)  98.1 F (36.7 C) 97.8 F (36.6 C)  TempSrc:   Oral   SpO2: 98% 98% 100% 98%  Weight:      Height:       Vitals and nursing note reviewed.  Constitutional:      General: He is not in acute distress. HENT:     Head: Normocephalic and atraumatic.  Cardiovascular:     Rate and Rhythm: Regular rate and rhythm    Heart sounds: Normal heart sounds.  Pulmonary:     Effort: Tachypnea present.     Breath sounds: Scattered wheezes Abdominal:     Palpations: Abdomen is soft.     Tenderness: There is no abdominal tenderness.  Neurological:     Mental Status: Mental status is at baseline.    Data Reviewed: Labs reviewed.  White count 14.2, lactic acid 2.1 There are no new results to review at this time.  Family Communication: Plan of care discharge the patient using the tele interpretation  Disposition: Status is: Inpatient Remains inpatient appropriate because: Continue bronchodilator therapy and inhaled steroids  Planned Discharge Destination: Home    Time spent: 73  minutes  Author: Lucile Shutters, MD 02/20/2023 1:15 PM  For on call review [email or web address].

## 2023-02-20 NOTE — Progress Notes (Signed)
Nutrition Follow-up  DOCUMENTATION CODES:   Not applicable  INTERVENTION:   -Glucerna Shake po BID, each supplement provides 220 kcal and 10 grams of protein  -Continue MVI with minerals daily -Continue liberalized diet of regular for widest variety of meal selections and to allow culturally familiar foods to optimize oral intake.  NUTRITION DIAGNOSIS:   Increased nutrient needs related to chronic illness as evidenced by estimated needs.  Ongoing  GOAL:   Patient will meet greater than or equal to 90% of their needs  Progressing   MONITOR:   PO intake, Supplement acceptance, Weight trends  REASON FOR ASSESSMENT:   Consult Assessment of nutrition requirement/status  ASSESSMENT:   77 y.o. M, admitted diabetes mellitus without complications.  PMH; Dementia, COPD noncompliant with oxygen, HTN, DM, hx of TB, lower GI bleed.  Reviewed I/O's: -342 ml x 24 hours and +2.1 L since admission  UOP: 1.5 L x 24 hours   Pt lying in bed at time of visit. No family at bedside to provide further history.   Observed meal tray; noted pt consumed 100% of lunch tray. Noted pt also was consuming outside foods. Documented meal completions 25-50%.   Agree with liberalized diet of regular to provide widest variety of meal selections and to allow for pt family to bring culturally familiar foods to promote adequate oral intake.   Reviewed wt hx; wt has been stable over the past 6 months.   Per TOC notes, pt family declining home health services.   Medications reviewed and include lovenox.   Lab Results  Component Value Date   HGBA1C 7.9 (A) 11/22/2022   PTA DM medications are 500 mg metformin BID. Per ADA's Standards of Medical Care of Diabetes, glycemic targets for older adults who have multiple co-morbidities, cognitive impairments, and functional dependence should be less stringent (Hgb A1c <8.0-8.5).    Labs reviewed: CBGS: 108-342 (inpatient orders for glycemic control are 0-15  units insulin aspart every 4 hours and 20 units insulin glargine-yfgn daily).    NUTRITION - FOCUSED PHYSICAL EXAM:  Flowsheet Row Most Recent Value  Orbital Region No depletion  Upper Arm Region No depletion  Thoracic and Lumbar Region No depletion  Buccal Region No depletion  Temple Region No depletion  Clavicle Bone Region No depletion  Clavicle and Acromion Bone Region No depletion  Scapular Bone Region No depletion  Dorsal Hand No depletion  Patellar Region Mild depletion  Anterior Thigh Region Mild depletion  Posterior Calf Region Mild depletion  Edema (RD Assessment) None  Hair Reviewed  Eyes Reviewed  Mouth Reviewed  Skin Reviewed  Nails Reviewed       Diet Order:   Diet Order             Diet Carb Modified Fluid consistency: Thin; Room service appropriate? Yes  Diet effective now                   EDUCATION NEEDS:   Not appropriate for education at this time  Skin:  Skin Assessment: Reviewed RN Assessment  Last BM:  02/19/23  Height:   Ht Readings from Last 1 Encounters:  02/19/23 5\' 1"  (1.549 m)    Weight:   Wt Readings from Last 1 Encounters:  02/19/23 49.9 kg    Ideal Body Weight:  47.7 kg  BMI:  Body mass index is 20.78 kg/m.  Estimated Nutritional Needs:   Kcal:  1500-1700  Protein:  75-90 grams  Fluid:  > 1.5 L  Levada Schilling, RD, LDN, CDCES Registered Dietitian III Certified Diabetes Care and Education Specialist Please refer to [facility] for RD and/or RD on-call/weekend/after hours pager

## 2023-02-21 DIAGNOSIS — J441 Chronic obstructive pulmonary disease with (acute) exacerbation: Secondary | ICD-10-CM

## 2023-02-21 DIAGNOSIS — J208 Acute bronchitis due to other specified organisms: Secondary | ICD-10-CM | POA: Diagnosis present

## 2023-02-21 DIAGNOSIS — E1165 Type 2 diabetes mellitus with hyperglycemia: Secondary | ICD-10-CM | POA: Diagnosis present

## 2023-02-21 LAB — BASIC METABOLIC PANEL
Anion gap: 8 (ref 5–15)
BUN: 16 mg/dL (ref 8–23)
CO2: 24 mmol/L (ref 22–32)
Calcium: 8.3 mg/dL — ABNORMAL LOW (ref 8.9–10.3)
Chloride: 108 mmol/L (ref 98–111)
Creatinine, Ser: 0.7 mg/dL (ref 0.61–1.24)
GFR, Estimated: >60 mL/min (ref >60)
Glucose, Bld: 135 mg/dL — ABNORMAL HIGH (ref 70–99)
Potassium: 3.3 mmol/L — ABNORMAL LOW (ref 3.5–5.1)
Sodium: 140 mmol/L (ref 135–145)

## 2023-02-21 LAB — GLUCOSE, CAPILLARY
Glucose-Capillary: 117 mg/dL — ABNORMAL HIGH (ref 70–99)
Glucose-Capillary: 132 mg/dL — ABNORMAL HIGH (ref 70–99)
Glucose-Capillary: 186 mg/dL — ABNORMAL HIGH (ref 70–99)
Glucose-Capillary: 301 mg/dL — ABNORMAL HIGH (ref 70–99)

## 2023-02-21 LAB — CBC
HCT: 31.5 % — ABNORMAL LOW (ref 39.0–52.0)
Hemoglobin: 10.9 g/dL — ABNORMAL LOW (ref 13.0–17.0)
MCH: 31.3 pg (ref 26.0–34.0)
MCHC: 34.6 g/dL (ref 30.0–36.0)
MCV: 90.5 fL (ref 80.0–100.0)
Platelets: 195 10*3/uL (ref 150–400)
RBC: 3.48 MIL/uL — ABNORMAL LOW (ref 4.22–5.81)
RDW: 13.4 % (ref 11.5–15.5)
WBC: 10.4 10*3/uL (ref 4.0–10.5)
nRBC: 0 % (ref 0.0–0.2)

## 2023-02-21 LAB — LEGIONELLA PNEUMOPHILA SEROGP 1 UR AG: L. pneumophila Serogp 1 Ur Ag: NEGATIVE

## 2023-02-21 LAB — URINE CULTURE: Culture: 10000 — AB

## 2023-02-21 LAB — LACTIC ACID, PLASMA: Lactic Acid, Venous: 2.1 mmol/L (ref 0.5–1.9)

## 2023-02-21 MED ORDER — GLIPIZIDE 5 MG PO TABS: 5.0000 mg | ORAL_TABLET | Freq: Every day | ORAL | 11 refills | Status: AC

## 2023-02-21 MED ORDER — PROMETHAZINE-DM 6.25-15 MG/5ML PO SYRP: 5.0000 mL | ORAL_SOLUTION | Freq: Four times a day (QID) | ORAL | 0 refills | Status: DC | PRN

## 2023-02-21 MED ORDER — GUAIFENESIN ER 600 MG PO TB12
600.0000 mg | ORAL_TABLET | Freq: Two times a day (BID) | ORAL | 0 refills | Status: AC
Start: 2023-02-21 — End: 2023-03-03

## 2023-02-21 MED ORDER — POTASSIUM CHLORIDE CRYS ER 20 MEQ PO TBCR
40.0000 meq | EXTENDED_RELEASE_TABLET | Freq: Once | ORAL | Status: AC
  Administered 2023-02-21: 40 meq via ORAL
  Filled 2023-02-21: qty 2

## 2023-02-21 MED ORDER — PREDNISONE 10 MG PO TABS: 10.0000 mg | ORAL_TABLET | Freq: Every day | ORAL | 0 refills | Status: AC

## 2023-02-21 MED ORDER — IPRATROPIUM-ALBUTEROL 0.5-2.5 (3) MG/3ML IN SOLN: 3.0000 mL | RESPIRATORY_TRACT | 1 refills | Status: AC | PRN

## 2023-02-21 NOTE — Discharge Summary (Signed)
Physician Discharge Summary   Patient: Jonathan Willis MRN: [id number] DOB: 27-Apr-1945  Admit date:     02/19/2023  Discharge date: 02/21/23  Discharge Physician: Laylanie Kruczek   PCP: Lorre Munroe, NP   Recommendations at discharge:   Take medications as prescribed Keep scheduled follow-up appointment with primary care provider Check and document blood sugars daily Use home oxygen as recommended  Discharge Diagnoses: Principal Problem:   COPD exacerbation (HCC) Active Problems:   Acute viral bronchitis   Essential hypertension   Uncontrolled type 2 diabetes mellitus with hyperglycemia, without long-term current use of insulin (HCC)   Lactic acidosis   Chronic respiratory failure (HCC)   BPH (benign prostatic hyperplasia)   Mild late onset Alzheimer's dementia without behavioral disturbance, psychotic disturbance, mood disturbance, or anxiety (HCC)   Combined pulmonary fibrosis and emphysema (CPFE) (HCC)   Rhinovirus infection  Resolved Problems:   * No resolved hospital problems. *  Hospital Course:  Jonathan Willis is a 77 y.o. male with medical history significant for Dementia, COPD noncompliant with oxygen 3L, HTN and diabetes presenting with a several day history of generalized malaise, somnolence and lethargy.  He developed chest pain and shortness of breath on the night of arrival.  No fevers or chills.  Spoke with daughter at bedside as patient speaks [place] only. ED course and data review: Febrile at 100.8, pulse 124 and respirations 22 with O2 sat 90 to 93% on room air. Labs with WBC 15,000 and lactic acid 3.5, procalcitonin 0.12 Troponin 6, D-dimer 0.49 which is normal Potassium 3.3, glucose 283 Urinalysis with trace leukocyte esterase EKG, personally viewed and interpreted showing sinus tachycardia at 120 Chest x-ray nonacute shows chronic right upper lobe scarring Patient treated with DuoNeb and Solu-Medrol started on cefepime and vancomycin for sepsis of suspected  respiratory source Hospitalist consulted for admission.     Assessment and Plan:   COPD with acute exacerbation Chronic respiratory failure Viral bronchitis Rhinovirus infection Sepsis (Ruled out) Patient presented to the ER for evaluation of shortness of breath and a nonproductive cough. Sepsis criteria includes fever, tachycardia, tachypnea, leukocytosis and lactic acidosis.  Procalcitonin 0.10 Chest x-ray was  non acute Respiratory viral panel is positive for rhinovirus Patient was treated empirically with antibiotic therapy with Rocephin and azithromycin Continue scheduled and as needed DuoNebs Will discharge on a short course of systemic steroids Supplemental oxygen to keep sats over 92.  Patient is on 3 L of oxygen at home Patient was ambulated prior to discharge and maintain pulse oximetry greater than 92%     Essential hypertension Blood pressure is stable     Diabetes mellitus with hyperglycemia (HCC) Secondary to systemic steroids Improved glycemic control with insulin Will start patient on glipizide 5 mg daily and continue metformin on discharge Continue consistent carbohydrate diet     Mild late onset Alzheimer's dementia without behavioral disturbance, psychotic disturbance, mood disturbance, or anxiety (HCC) Continue Aricept               Consultants: None Procedures performed: None  Disposition: Home Diet recommendation:  Discharge Diet Orders (From admission, onward)     Start     Ordered   02/21/23 0000  Diet - low sodium heart healthy        02/21/23 1012   02/21/23 0000  Diet Carb Modified        02/21/23 1012           Carb modified diet DISCHARGE MEDICATION: Allergies as of 02/21/2023  No Known Allergies      Medication List     STOP taking these medications    promethazine-dextromethorphan 6.25-15 MG/5ML syrup Commonly known as: PROMETHAZINE-DM       TAKE these medications    albuterol 108 (90 Base) MCG/ACT  inhaler Commonly known as: VENTOLIN HFA Inhale 2 puffs into the lungs every 6 (six) hours as needed for wheezing or shortness of breath.   aspirin EC 81 MG tablet Take 1 tablet (81 mg total) by mouth daily. Swallow whole.   donepezil 5 MG tablet Commonly known as: ARICEPT Take by mouth.   furosemide 20 MG tablet Commonly known as: LASIX Take 20 mg by mouth daily.   glipiZIDE 5 MG tablet Commonly known as: GLUCOTROL Take 1 tablet (5 mg total) by mouth daily before breakfast.   guaiFENesin 600 MG 12 hr tablet Commonly known as: Mucinex Take 1 tablet (600 mg total) by mouth 2 (two) times daily for 10 days.   ipratropium-albuterol 0.5-2.5 (3) MG/3ML Soln Commonly known as: DUONEB Take 3 mLs by nebulization every 4 (four) hours as needed.   levothyroxine 25 MCG tablet Commonly known as: SYNTHROID Take 1 tablet (25 mcg total) by mouth daily.   metFORMIN 500 MG tablet Commonly known as: GLUCOPHAGE Take 1 tablet (500 mg total) by mouth 2 (two) times daily with a meal.   OXYGEN Inhale into the lungs. 3 Liters   predniSONE 10 MG tablet Commonly known as: DELTASONE Take 1 tablet (10 mg total) by mouth daily for 5 days. What changed:  how much to take how to take this when to take this additional instructions   rosuvastatin 10 MG tablet Commonly known as: CRESTOR Take 1 tablet (10 mg total) by mouth daily.   Trelegy Ellipta 100-62.5-25 MCG/ACT Aepb Generic drug: Fluticasone-Umeclidin-Vilant Inhale 1 puff into the lungs daily.        Discharge Exam: Filed Weights   02/19/23 0232  Weight: 49.9 kg   Vitals and nursing note reviewed.  Constitutional:      General: He is not in acute distress. HENT:     Head: Normocephalic and atraumatic.  Cardiovascular:     Rate and Rhythm: Regular rate and rhythm    Heart sounds: Normal heart sounds.  Pulmonary:     Effort: Bilateral air entry    Breath sounds: Faint wheezes Abdominal:     Palpations: Abdomen is soft.      Tenderness: There is no abdominal tenderness.  Neurological:     Mental Status: Mental status is at baseline.   Condition at discharge: stable  The results of significant diagnostics from this hospitalization (including imaging, microbiology, ancillary and laboratory) are listed below for reference.   Imaging Studies: DG Chest 2 View  Result Date: 02/19/2023 CLINICAL DATA:  Chest pain and shortness of breath EXAM: CHEST - 2 VIEW COMPARISON:  09/03/2022 FINDINGS: No change from 09/03/2022. Stable cardiomediastinal silhouette. Low lung volumes accentuate pulmonary vascularity. Calcified granuloma right upper lung. Chronic right upper lobe scarring and volume loss. No focal consolidation, pleural effusion, or pneumothorax. IMPRESSION: No change from 09/03/2022. Chronic right upper lobe scarring and volume loss. Electronically Signed   By: Minerva Fester M.D.   On: 02/19/2023 03:19    Microbiology: Results for orders placed or performed during the hospital encounter of 02/19/23  Resp panel by RT-PCR (RSV, Flu A&B, Covid) Anterior Nasal Swab     Status: None   Collection Time: 02/19/23  3:45 AM   Specimen: Anterior Nasal Swab  Result Value Ref Range Status   SARS Coronavirus 2 by RT PCR NEGATIVE NEGATIVE Final    Comment: (NOTE) SARS-CoV-2 target nucleic acids are NOT DETECTED.  The SARS-CoV-2 RNA is generally detectable in upper respiratory specimens during the acute phase of infection. The lowest concentration of SARS-CoV-2 viral copies this assay can detect is 138 copies/mL. A negative result does not preclude SARS-Cov-2 infection and should not be used as the sole basis for treatment or other patient management decisions. A negative result may occur with  improper specimen collection/handling, submission of specimen other than nasopharyngeal swab, presence of viral mutation(s) within the areas targeted by this assay, and inadequate number of viral copies(<138 copies/mL). A negative  result must be combined with clinical observations, patient history, and epidemiological information. The expected result is Negative.  Fact Sheet for Patients:  BloggerCourse.com  Fact Sheet for Healthcare Providers:  SeriousBroker.it  This test is no t yet approved or cleared by the [place] FDA and  has been authorized for detection and/or diagnosis of SARS-CoV-2 by FDA under an Emergency Use Authorization (EUA). This EUA will remain  in effect (meaning this test can be used) for the duration of the COVID-19 declaration under Section 564(b)(1) of the Act, 21 U.S.C.section 360bbb-3(b)(1), unless the authorization is terminated  or revoked sooner.       Influenza A by PCR NEGATIVE NEGATIVE Final   Influenza B by PCR NEGATIVE NEGATIVE Final    Comment: (NOTE) The Xpert Xpress SARS-CoV-2/FLU/RSV plus assay is intended as an aid in the diagnosis of influenza from Nasopharyngeal swab specimens and should not be used as a sole basis for treatment. Nasal washings and aspirates are unacceptable for Xpert Xpress SARS-CoV-2/FLU/RSV testing.  Fact Sheet for Patients: BloggerCourse.com  Fact Sheet for Healthcare Providers: SeriousBroker.it  This test is not yet approved or cleared by the [place] FDA and has been authorized for detection and/or diagnosis of SARS-CoV-2 by FDA under an Emergency Use Authorization (EUA). This EUA will remain in effect (meaning this test can be used) for the duration of the COVID-19 declaration under Section 564(b)(1) of the Act, 21 U.S.C. section 360bbb-3(b)(1), unless the authorization is terminated or revoked.     Resp Syncytial Virus by PCR NEGATIVE NEGATIVE Final    Comment: (NOTE) Fact Sheet for Patients: BloggerCourse.com  Fact Sheet for Healthcare Providers: SeriousBroker.it  This  test is not yet approved or cleared by the [place] FDA and has been authorized for detection and/or diagnosis of SARS-CoV-2 by FDA under an Emergency Use Authorization (EUA). This EUA will remain in effect (meaning this test can be used) for the duration of the COVID-19 declaration under Section 564(b)(1) of the Act, 21 U.S.C. section 360bbb-3(b)(1), unless the authorization is terminated or revoked.  Performed at [facility], [address] [place], [place] [address]   Blood culture (routine x 2)     Status: None (Preliminary result)   Collection Time: 02/19/23  3:45 AM   Specimen: BLOOD  Result Value Ref Range Status   Specimen Description BLOOD RIGHT ANTECUBITAL  Final   Special Requests   Final    BOTTLES DRAWN AEROBIC AND ANAEROBIC Blood Culture results may not be optimal due to an excessive volume of blood received in culture bottles   Culture   Final    NO GROWTH 2 DAYS Performed at [facility], [address] [place], [place] [address]    Report Status PENDING  Incomplete  Blood culture (routine x 2)  Status: None (Preliminary result)   Collection Time: 02/19/23  3:45 AM   Specimen: BLOOD  Result Value Ref Range Status   Specimen Description BLOOD LEFT ANTECUBITAL  Final   Special Requests   Final    BOTTLES DRAWN AEROBIC AND ANAEROBIC Blood Culture results may not be optimal due to an excessive volume of blood received in culture bottles   Culture   Final    NO GROWTH 2 DAYS Performed at [facility], [address], [place], [place] [address]    Report Status PENDING  Incomplete  Urine Culture (for pregnant, neutropenic or urologic patients or patients with an indwelling urinary catheter)     Status: Abnormal   Collection Time: 02/19/23  3:45 AM   Specimen: Urine, Clean Catch  Result Value Ref Range Status   Specimen Description   Final    URINE, CLEAN CATCH Performed at [facility], [address],  [place], [place] [address]    Special Requests   Final    NONE Performed at [facility], [address], [place], [place] [address]    Culture MULTIPLE SPECIES PRESENT, SUGGEST RECOLLECTION (A)  Final   Report Status 02/21/2023 FINAL  Final  MRSA Next Gen by PCR, Nasal     Status: None   Collection Time: 02/19/23 10:17 AM   Specimen: Nasal Mucosa; Nasal Swab  Result Value Ref Range Status   MRSA by PCR Next Gen NOT DETECTED NOT DETECTED Final    Comment: (NOTE) The GeneXpert MRSA Assay (FDA approved for NASAL specimens only), is one component of a comprehensive MRSA colonization surveillance program. It is not intended to diagnose MRSA infection nor to guide or monitor treatment for MRSA infections. Test performance is not FDA approved in patients less than 31 years old. Performed at [facility], [address] [place], [place] [address]   Respiratory (~20 pathogens) panel by PCR     Status: Abnormal   Collection Time: 02/19/23 10:17 AM   Specimen: Nasopharyngeal Swab; Respiratory  Result Value Ref Range Status   Adenovirus NOT DETECTED NOT DETECTED Final   Coronavirus 229E NOT DETECTED NOT DETECTED Final    Comment: (NOTE) The Coronavirus on the Respiratory Panel, DOES NOT test for the novel  Coronavirus (2019 nCoV)    Coronavirus HKU1 NOT DETECTED NOT DETECTED Final   Coronavirus NL63 NOT DETECTED NOT DETECTED Final   Coronavirus OC43 NOT DETECTED NOT DETECTED Final   Metapneumovirus NOT DETECTED NOT DETECTED Final   Rhinovirus / Enterovirus DETECTED (A) NOT DETECTED Final   Influenza A NOT DETECTED NOT DETECTED Final   Influenza B NOT DETECTED NOT DETECTED Final   Parainfluenza Virus 1 NOT DETECTED NOT DETECTED Final   Parainfluenza Virus 2 NOT DETECTED NOT DETECTED Final   Parainfluenza Virus 3 NOT DETECTED NOT DETECTED Final   Parainfluenza Virus 4 NOT DETECTED NOT DETECTED Final   Respiratory Syncytial Virus NOT DETECTED NOT DETECTED Final    Bordetella pertussis NOT DETECTED NOT DETECTED Final   Bordetella Parapertussis NOT DETECTED NOT DETECTED Final   Chlamydophila pneumoniae NOT DETECTED NOT DETECTED Final   Mycoplasma pneumoniae NOT DETECTED NOT DETECTED Final    Comment: Performed at [facility], [address], [place], [place] [address]    Labs: CBC: Recent Labs  Lab 02/19/23 0237 02/20/23 0506 02/21/23 0415  WBC 14.9* 14.2* 10.4  HGB 14.6 10.8* 10.9*  HCT 42.3 31.6* 31.5*  MCV 90.8 91.3 90.5  PLT 234 186 195  Basic Metabolic Panel: Recent Labs  Lab 02/19/23 0237 02/19/23 1745 02/20/23 0506 02/21/23 0415  NA 133* 131* 137 140  K 3.3* 3.6 3.6 3.3*  CL 95* 100 107 108  CO2 23 24 24 24   GLUCOSE 283* 391* 326* 135*  BUN 14 26* 22 16  CREATININE 0.94 0.92 0.91 0.70  CALCIUM 9.2 8.9 8.5* 8.3*   Liver Function Tests: No results for input(s): "AST", "ALT", "ALKPHOS", "BILITOT", "PROT", "ALBUMIN" in the last 168 hours. CBG: Recent Labs  Lab 02/20/23 1719 02/20/23 1934 02/21/23 0015 02/21/23 0429 02/21/23 0740  GLUCAP 267* 241* 186* 117* 132*    Discharge time spent: greater than 30 minutes.  Signed: Lucile Shutters, MD [facility] 02/21/2023

## 2023-02-21 NOTE — Plan of Care (Signed)
Patient discharged per MD orders at this time.All dc instructions,education and medications reviewed with the patient and son.Pt expressed understanding and will comply with dc instructions.follow up appointments was also communicated to the patient.no verbal c/o or any ssx of distress.Pt was dc home with self-care per order.Pt was transported home by son in a privately owned vehicle.

## 2023-02-21 NOTE — Plan of Care (Signed)
Problem: Education: Goal: Ability to describe self-care measures that may prevent or decrease complications (Diabetes Survival Skills Education) will improve Outcome: Adequate for Discharge Goal: Individualized Educational Video(s) Outcome: Adequate for Discharge   Problem: Coping: Goal: Ability to adjust to condition or change in health will improve Outcome: Adequate for Discharge   Problem: Fluid Volume: Goal: Ability to maintain a balanced intake and output will improve Outcome: Adequate for Discharge   Problem: Health Behavior/Discharge Planning: Goal: Ability to identify and utilize available resources and services will improve Outcome: Adequate for Discharge Goal: Ability to manage health-related needs will improve Outcome: Adequate for Discharge   Problem: Metabolic: Goal: Ability to maintain appropriate glucose levels will improve Outcome: Adequate for Discharge   Problem: Nutritional: Goal: Maintenance of adequate nutrition will improve Outcome: Adequate for Discharge Goal: Progress toward achieving an optimal weight will improve Outcome: Adequate for Discharge   Problem: Skin Integrity: Goal: Risk for impaired skin integrity will decrease Outcome: Adequate for Discharge   Problem: Tissue Perfusion: Goal: Adequacy of tissue perfusion will improve Outcome: Adequate for Discharge   Problem: Fluid Volume: Goal: Hemodynamic stability will improve Outcome: Adequate for Discharge   Problem: Clinical Measurements: Goal: Diagnostic test results will improve Outcome: Adequate for Discharge Goal: Signs and symptoms of infection will decrease Outcome: Adequate for Discharge   Problem: Respiratory: Goal: Ability to maintain adequate ventilation will improve Outcome: Adequate for Discharge   Problem: Education: Goal: Knowledge of General Education information will improve Description: Including pain rating scale, medication(s)/side effects and non-pharmacologic  comfort measures Outcome: Adequate for Discharge   Problem: Health Behavior/Discharge Planning: Goal: Ability to manage health-related needs will improve Outcome: Adequate for Discharge   Problem: Clinical Measurements: Goal: Ability to maintain clinical measurements within normal limits will improve Outcome: Adequate for Discharge Goal: Will remain free from infection Outcome: Adequate for Discharge Goal: Diagnostic test results will improve Outcome: Adequate for Discharge Goal: Respiratory complications will improve Outcome: Adequate for Discharge Goal: Cardiovascular complication will be avoided Outcome: Adequate for Discharge   Problem: Activity: Goal: Risk for activity intolerance will decrease Outcome: Adequate for Discharge   Problem: Nutrition: Goal: Adequate nutrition will be maintained Outcome: Adequate for Discharge   Problem: Coping: Goal: Level of anxiety will decrease Outcome: Adequate for Discharge   Problem: Elimination: Goal: Will not experience complications related to bowel motility Outcome: Adequate for Discharge Goal: Will not experience complications related to urinary retention Outcome: Adequate for Discharge   Problem: Pain Management: Goal: General experience of comfort will improve Outcome: Adequate for Discharge   Problem: Safety: Goal: Ability to remain free from injury will improve Outcome: Adequate for Discharge   Problem: Skin Integrity: Goal: Risk for impaired skin integrity will decrease Outcome: Adequate for Discharge

## 2023-02-24 LAB — CULTURE, BLOOD (ROUTINE X 2)
Culture: NO GROWTH
Culture: NO GROWTH

## 2023-03-06 DIAGNOSIS — J449 Chronic obstructive pulmonary disease, unspecified: Secondary | ICD-10-CM | POA: Diagnosis not present

## 2023-03-10 ENCOUNTER — Ambulatory Visit (INDEPENDENT_AMBULATORY_CARE_PROVIDER_SITE_OTHER): Payer: Medicare Other

## 2023-03-10 VITALS — BP 110/62 | Ht 61.0 in | Wt 107.0 lb

## 2023-03-10 DIAGNOSIS — Z0000 Encounter for general adult medical examination without abnormal findings: Secondary | ICD-10-CM | POA: Diagnosis not present

## 2023-03-10 NOTE — Patient Instructions (Addendum)
Jonathan Willis , Thank you for taking time to come for your Medicare Wellness Visit. I appreciate your ongoing commitment to your health goals. Please review the following plan we discussed and let me know if I can assist you in the future.   Referrals/Orders/Follow-Ups/Clinician Recommendations: NONE  This is a list of the screening recommended for you and due dates:  Health Maintenance  Topic Date Due   Eye exam for diabetics  12/01/2018   COVID-19 Vaccine (7 - 2024-25 season) 11/27/2022   Zoster (Shingles) Vaccine (1 of 2) 05/12/2023*   Colon Cancer Screening  02/09/2024*   Screening for Lung Cancer  04/24/2023   Hemoglobin A1C  05/25/2023   Yearly kidney health urinalysis for diabetes  11/22/2023   Complete foot exam   11/22/2023   Yearly kidney function blood test for diabetes  02/21/2024   Medicare Annual Wellness Visit  03/09/2024   DTaP/Tdap/Td vaccine (5 - Td or Tdap) 10/15/2028   Pneumonia Vaccine  Completed   Flu Shot  Completed   Hepatitis C Screening  Completed   HPV Vaccine  Aged Out  *Topic was postponed. The date shown is not the original due date.    Advanced directives: (ACP Link)Information on Advanced Care Planning can be found at [facility] Advance Health Care Directives Advance Health Care Directives ([email or web address]   Next Medicare Annual Wellness Visit scheduled for next year: Yes   03/15/24 @ 8:10 AM IN PERSON

## 2023-03-10 NOTE — Progress Notes (Signed)
Subjective:   Jonathan Willis is a 77 y.o. male who presents for Medicare Annual/Subsequent preventive examination.  Visit Complete: In person His daughter-in-law acted as interpreter today  Cardiac Risk Factors include: advanced age (>52men, >79 women);dyslipidemia;diabetes mellitus;hypertension;male gender;family history of premature cardiovascular disease     Objective:    Today's Vitals   03/10/23 0801 03/10/23 0804  BP: 110/62   Weight: 107 lb (48.5 kg)   Height: 5\' 1"  (1.549 m)   PainSc:  0-No pain   Body mass index is 20.22 kg/m.     03/10/2023    8:11 AM 02/19/2023    6:20 AM 02/19/2023    2:34 AM 07/18/2022    9:28 AM 04/27/2022   10:42 AM 04/24/2022   10:00 AM 04/23/2022    4:03 PM  Advanced Directives  Does Patient Have a Medical Advance Directive? No No No No No No No  Would patient like information on creating a medical advance directive? No - Patient declined Yes (Inpatient - patient requests chaplain consult to create a medical advance directive)   No - Patient declined No - Patient declined     Current Medications (verified) Outpatient Encounter Medications as of 03/10/2023  Medication Sig   aspirin EC 81 MG tablet Take 1 tablet (81 mg total) by mouth daily. Swallow whole.   donepezil (ARICEPT) 5 MG tablet Take by mouth.   Fluticasone-Umeclidin-Vilant (TRELEGY ELLIPTA) 100-62.5-25 MCG/ACT AEPB Inhale 1 puff into the lungs daily.   furosemide (LASIX) 20 MG tablet Take 20 mg by mouth daily.   glipiZIDE (GLUCOTROL) 5 MG tablet Take 1 tablet (5 mg total) by mouth daily before breakfast.   ipratropium-albuterol (DUONEB) 0.5-2.5 (3) MG/3ML SOLN Take 3 mLs by nebulization every 4 (four) hours as needed.   levothyroxine (SYNTHROID) 25 MCG tablet Take 1 tablet (25 mcg total) by mouth daily.   metFORMIN (GLUCOPHAGE) 500 MG tablet Take 1 tablet (500 mg total) by mouth 2 (two) times daily with a meal.   Multiple Vitamin (MULTIVITAMIN) tablet Take 1 tablet by mouth daily.    OXYGEN Inhale into the lungs. 3 Liters   rosuvastatin (CRESTOR) 10 MG tablet Take 1 tablet (10 mg total) by mouth daily.   albuterol (VENTOLIN HFA) 108 (90 Base) MCG/ACT inhaler Inhale 2 puffs into the lungs every 6 (six) hours as needed for wheezing or shortness of breath. (Patient not taking: Reported on 03/10/2023)   No facility-administered encounter medications on file as of 03/10/2023.    Allergies (verified) Patient has no known allergies.   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   History of kidney stones    History of TB (tuberculosis)    Hypertension    Lower GI bleed    Tuberculosis 1995   Past Surgical History:  Procedure Laterality Date   BRONCHIAL WASHINGS N/A 04/27/2022   Procedure: BRONCHIAL WASHINGS;  Surgeon: Vida Rigger, MD;  Location: [facility] ORS;  Service: Thoracic;  Laterality: N/A;   COLONOSCOPY WITH PROPOFOL N/A 04/10/2017   Procedure: COLONOSCOPY WITH PROPOFOL;  Surgeon: Wyline Mood, MD;  Location: [facility] ENDOSCOPY;  Service: Gastroenterology;  Laterality: N/A;   NO PAST SURGERIES     RIGID BRONCHOSCOPY N/A 04/27/2022   Procedure: RIGID BRONCHOSCOPY;  Surgeon: Vida Rigger, MD;  Location: [facility] ORS;  Service: Thoracic;  Laterality: N/A;   Family History  Problem Relation Age of Onset   COPD Neg Hx    Diabetes Mellitus II Neg Hx    Hypertension Neg Hx    Social History   Socioeconomic History   Marital status: Divorced    Spouse name: Not on file   Number of children: Not on file   Years of education: 9   Highest education level: 9th grade  Occupational History   Occupation: retired  Tobacco Use   Smoking status: Former    Current packs/day: 0.00    Average packs/day: 1.5 packs/day for 44.0 years  (66.0 ttl pk-yrs)    Types: Cigarettes    Start date: 01/11/1963    Quit date: 01/11/2007    Years since quitting: 16.1   Smokeless tobacco: Former  Building services engineer status: Never Used  Substance and Sexual Activity   Alcohol use: Yes    Comment: very rarely   Drug use: Never   Sexual activity: Yes    Birth control/protection: None  Other Topics Concern   Not on file  Social History Narrative   Not on file   Social Drivers of Health   Financial Resource Strain: Low Risk  (03/10/2023)   Overall Financial Resource Strain (CARDIA)    Difficulty of Paying Living Expenses: Not hard at all  Food Insecurity: No Food Insecurity (03/10/2023)   Hunger Vital Sign    Worried About Running Out of Food in the Last Year: Never true    Ran Out of Food in the Last Year: Never true  Transportation Needs: No Transportation Needs (03/10/2023)   PRAPARE - Administrator, Civil Service (Medical): No    Lack of Transportation (Non-Medical): No  Physical Activity: Insufficiently Active (03/10/2023)   Exercise Vital Sign    Days of Exercise per Week: 3 days    Minutes of Exercise per Session: 30 min  Stress: No Stress Concern Present (03/10/2023)   [employer] of Occupational Health - Occupational Stress Questionnaire    Feeling of Stress : Not at all  Social Connections: Moderately Isolated (03/10/2023)   Social Connection and Isolation Panel [NHANES]    Frequency of Communication with Friends and Family: More than three times a week    Frequency of Social Gatherings with Friends and Family: More than three times a week    Attends Religious Services: 1 to 4 times per year    Active Member of [employer] or Organizations: No    Attends Engineer, structural: Never    Marital Status: Divorced    Tobacco Counseling Counseling given: Not Answered   Clinical Intake:  Pre-visit preparation completed: Yes  Pain : No/denies pain Pain Score: 0-No pain     BMI -  recorded: 20.22 Nutritional Status: BMI of 19-24  Normal Nutritional Risks: None Diabetes: Yes CBG done?: No Did pt. bring in CBG monitor from home?: No  How often do you need to have someone help you when you read instructions, pamphlets, or other written materials from your doctor or pharmacy?: 5 - Always What is the last grade level you completed in school?: language barrier  Interpreter Needed?: Yes Interpreter Name: daughter- in-law Patient Declined Interpreter : No Patient signed [place] waiver: No  Information entered by :: Kennedy Bucker, LPN   Activities of Daily Living    03/10/2023    8:12 AM 02/19/2023    6:20 AM  In your  present state of health, do you have any difficulty performing the following activities:  Hearing? 0 0  Vision? 0 0  Difficulty concentrating or making decisions? 0 1  Walking or climbing stairs? 0   Dressing or bathing? 0   Doing errands, shopping? 1 1  Preparing Food and eating ? N   Using the Toilet? N   In the past six months, have you accidently leaked urine? N   Do you have problems with loss of bowel control? N   Managing your Medications? Y   Managing your Finances? Y   Housekeeping or managing your Housekeeping? Y     Patient Care Team: Lorre Munroe, NP as PCP - General (Internal Medicine) Minor, Theadora Rama, RN (Inactive) as [employer] agent, [facility]  Indicate any recent Medical Services you may have received from other than [facility] providers in the past year (date may be approximate).     Assessment:   This is a routine wellness examination for [place].  Hearing/Vision screen Hearing Screening - Comments:: No aids Vision Screening - Comments:: Readers- [facility]   Goals Addressed             This Visit's Progress    DIET - REDUCE SUGAR INTAKE         Depression Screen    03/10/2023    8:09 AM 02/09/2023    9:15 AM 11/22/2022   10:27 AM 04/26/2022    8:42 AM 03/04/2022     8:36 AM 02/22/2022    2:00 PM 04/16/2020    8:35 AM  PHQ 2/9 Scores  PHQ - 2 Score 0 2 0 0 0 0 0  PHQ- 9 Score 0   0 0      Fall Risk    03/10/2023    8:12 AM 02/09/2023    9:15 AM 11/22/2022   10:27 AM 04/26/2022    8:43 AM 03/04/2022    8:40 AM  Fall Risk   Falls in the past year? 0 0 0 0 0  Number falls in past yr: 0    0  Injury with Fall? 0 0 0 0 0  Risk for fall due to : No Fall Risks  No Fall Risks No Fall Risks No Fall Risks  Follow up Falls prevention discussed;Falls evaluation completed    Falls prevention discussed;Falls evaluation completed    MEDICARE RISK AT HOME: Medicare Risk at Home Any stairs in or around the home?: Yes If so, are there any without handrails?: No Home free of loose throw rugs in walkways, pet beds, electrical cords, etc?: Yes Adequate lighting in your home to reduce risk of falls?: Yes Life alert?: No Use of a cane, walker or w/c?: No Grab bars in the bathroom?: No Shower chair or bench in shower?: Yes Elevated toilet seat or a handicapped toilet?: No  TIMED UP AND GO:  Was the test performed?  Yes  Length of time to ambulate 10 feet: 4 sec Gait steady and fast without use of assistive device    Cognitive Function:        03/10/2023    8:14 AM 03/04/2022    8:47 AM  6CIT Screen  What Year? 0 points 0 points  What month? 0 points 0 points  What time? 3 points 0 points  Count back from 20 4 points 0 points  Months in reverse 4 points 0 points  Repeat phrase 10 points   Total Score 21  points     Immunizations Immunization History  Administered Date(s) Administered   Fluad Trivalent(High Dose 65+) 11/22/2022   Hep B, Unspecified 09/18/1996, 03/17/1997   Hepatitis B 09/18/1996, 03/17/1997   Influenza, High Dose Seasonal PF 01/10/2017, 12/05/2017   PFIZER Comirnaty(Gray Top)Covid-19 Tri-Sucrose Vaccine 05/23/2019, 06/13/2019, 04/06/2020   PFIZER(Purple Top)SARS-COV-2 Vaccination 05/23/2019, 06/13/2019, 04/06/2020    Pneumococcal Conjugate-13 01/10/2017   Pneumococcal Polysaccharide-23 04/03/2018   Td 08/07/1995, 10/07/1995, 03/29/1996   Tdap 10/16/2018    TDAP status: Up to date  Flu Vaccine status: Up to date  Pneumococcal vaccine status: Up to date  Covid-19 vaccine status: Completed vaccines  Qualifies for Shingles Vaccine? Yes   Zostavax completed No   Shingrix Completed?: No.    Education has been provided regarding the importance of this vaccine. Patient has been advised to call insurance company to determine out of pocket expense if they have not yet received this vaccine. Advised may also receive vaccine at local pharmacy or Health Dept. Verbalized acceptance and understanding.  Screening Tests Health Maintenance  Topic Date Due   OPHTHALMOLOGY EXAM  12/01/2018   COVID-19 Vaccine (7 - 2024-25 season) 11/27/2022   Zoster Vaccines- Shingrix (1 of 2) 05/12/2023 (Originally 12/13/1993)   Colonoscopy  02/09/2024 (Originally 04/10/2020)   Lung Cancer Screening  04/24/2023   HEMOGLOBIN A1C  05/25/2023   Diabetic kidney evaluation - Urine ACR  11/22/2023   FOOT EXAM  11/22/2023   Diabetic kidney evaluation - eGFR measurement  02/21/2024   Medicare Annual Wellness (AWV)  03/09/2024   DTaP/Tdap/Td (5 - Td or Tdap) 10/15/2028   Pneumonia Vaccine 52+ Years old  Completed   INFLUENZA VACCINE  Completed   Hepatitis C Screening  Completed   HPV VACCINES  Aged Out    Health Maintenance  Health Maintenance Due  Topic Date Due   OPHTHALMOLOGY EXAM  12/01/2018   COVID-19 Vaccine (7 - 2024-25 season) 11/27/2022    Colorectal cancer screening: No longer required.   Lung Cancer Screening: (Low Dose CT Chest recommended if Age 70-80 years, 20 pack-year currently smoking OR have quit w/in 15years.) does not qualify.    Additional Screening:  Hepatitis C Screening: does qualify; Completed 02/15/17  Vision Screening: Recommended annual ophthalmology exams for early detection of glaucoma and  other disorders of the eye. Is the patient up to date with their annual eye exam?  Yes  Who is the provider or what is the name of the office in which the patient attends annual eye exams? [facility] If pt is not established with a provider, would they like to be referred to a provider to establish care? No .   Dental Screening: Recommended annual dental exams for proper oral hygiene  Diabetic Foot Exam: Diabetic Foot Exam: Completed 11/22/22  Community Resource Referral / Chronic Care Management: CRR required this visit?  No   CCM required this visit?  No     Plan:     I have personally reviewed and noted the following in the patient's chart:   Medical and social history Use of alcohol, tobacco or illicit drugs  Current medications and supplements including opioid prescriptions. Patient is not currently taking opioid prescriptions. Functional ability and status Nutritional status Physical activity Advanced directives List of other physicians Hospitalizations, surgeries, and ER visits in previous 12 months Vitals Screenings to include cognitive, depression, and falls Referrals and appointments  In addition, I have reviewed and discussed with patient certain preventive protocols, quality metrics, and best practice recommendations. A written  personalized care plan for preventive services as well as general preventive health recommendations were provided to patient.     Hal Hope, LPN   40/12/2723   After Visit Summary: (In Person-Declined) Patient declined AVS at this time.  Nurse Notes: none

## 2023-03-14 ENCOUNTER — Other Ambulatory Visit: Payer: Self-pay

## 2023-03-14 DIAGNOSIS — R7989 Other specified abnormal findings of blood chemistry: Secondary | ICD-10-CM

## 2023-03-14 DIAGNOSIS — E039 Hypothyroidism, unspecified: Secondary | ICD-10-CM

## 2023-03-15 ENCOUNTER — Other Ambulatory Visit: Payer: Medicare Other

## 2023-03-15 DIAGNOSIS — E039 Hypothyroidism, unspecified: Secondary | ICD-10-CM | POA: Diagnosis not present

## 2023-03-15 DIAGNOSIS — R7989 Other specified abnormal findings of blood chemistry: Secondary | ICD-10-CM | POA: Diagnosis not present

## 2023-03-16 LAB — TSH: TSH: 4.41 m[IU]/L (ref 0.40–4.50)

## 2023-03-16 NOTE — Progress Notes (Signed)
Patient notified of Tsh levels.  Verbal understanding

## 2023-05-21 ENCOUNTER — Other Ambulatory Visit: Payer: Self-pay | Admitting: Internal Medicine

## 2023-05-21 DIAGNOSIS — E1169 Type 2 diabetes mellitus with other specified complication: Secondary | ICD-10-CM

## 2023-05-23 NOTE — Telephone Encounter (Signed)
 Requested Prescriptions  Pending Prescriptions Disp Refills   metFORMIN (GLUCOPHAGE) 500 MG tablet [Pharmacy Med Name: METFORMIN HCL 500 MG TABLET] 180 tablet 0    Sig: TAKE 1 TABLET BY MOUTH 2 TIMES DAILY WITH A MEAL.     Endocrinology:  Diabetes - Biguanides Failed - 05/23/2023  9:14 AM      Failed - HBA1C is between 0 and 7.9 and within 180 days    Hemoglobin A1C  Date Value Ref Range Status  11/22/2022 7.9 (A) 4.0 - 5.6 % Final   Hgb A1c MFr Bld  Date Value Ref Range Status  02/22/2022 6.9 (H) <5.7 % of total Hgb Final    Comment:    For someone without known diabetes, a hemoglobin A1c value of 6.5% or greater indicates that they may have  diabetes and this should be confirmed with a follow-up  test. . For someone with known diabetes, a value <7% indicates  that their diabetes is well controlled and a value  greater than or equal to 7% indicates suboptimal  control. A1c targets should be individualized based on  duration of diabetes, age, comorbid conditions, and  other considerations. . Currently, no consensus exists regarding use of hemoglobin A1c for diagnosis of diabetes for children. .          Failed - B12 Level in normal range and within 720 days    No results found for: "VITAMINB12"       Passed - Cr in normal range and within 360 days    Creat  Date Value Ref Range Status  11/22/2022 0.97 0.70 - 1.28 mg/dL Final   Creatinine, Ser  Date Value Ref Range Status  02/21/2023 0.70 0.61 - 1.24 mg/dL Final   Creatinine, Urine  Date Value Ref Range Status  11/22/2022 111 20 - 320 mg/dL Final         Passed - eGFR in normal range and within 360 days    GFR, Est African American  Date Value Ref Range Status  10/16/2018 85 > OR = 60 mL/min/1.58m2 Final   GFR calc Af Amer  Date Value Ref Range Status  06/06/2019 >60 >60 mL/min Final   GFR, Est Non African American  Date Value Ref Range Status  10/16/2018 73 > OR = 60 mL/min/1.42m2 Final   GFR, Estimated   Date Value Ref Range Status  02/21/2023 >60 >60 mL/min Final    Comment:    (NOTE) Calculated using the CKD-EPI Creatinine Equation (2021)    eGFR  Date Value Ref Range Status  11/22/2022 80 > OR = 60 mL/min/1.71m2 Final         Passed - Valid encounter within last 6 months    Recent Outpatient Visits           3 months ago Abnormal TSH   [place] [facility] [place], Salvadore [place], NP   5 months ago COPD exacerbation [facility])   [place] [facility] [place], [place] W, NP   6 months ago Type 2 diabetes mellitus with other specified complication, without long-term current use of insulin [facility])   [place] [facility] [place], Salvadore Oxford, NP   10 months ago Vertigo   [place] [facility] [place], [place] W, NP   1 year ago Sepsis, due to unspecified organism, unspecified whether acute organ dysfunction present [facility])   [place] [facility] [place], Salvadore Oxford, [place]  Future Appointments             In 6 days [place], Salvadore Oxford, NP [place] [facility]            Passed - CBC within normal limits and completed in the last 12 months    WBC  Date Value Ref Range Status  02/21/2023 10.4 4.0 - 10.5 K/uL Final   RBC  Date Value Ref Range Status  02/21/2023 3.48 (L) 4.22 - 5.81 MIL/uL Final   Hemoglobin  Date Value Ref Range Status  02/21/2023 10.9 (L) 13.0 - 17.0 g/dL Final   HGB  Date Value Ref Range Status  02/06/2014 14.1 13.0 - 18.0 g/dL Final   HCT  Date Value Ref Range Status  02/21/2023 31.5 (L) 39.0 - 52.0 % Final  02/06/2014 42.3 40.0 - 52.0 % Final   MCHC  Date Value Ref Range Status  02/21/2023 34.6 30.0 - 36.0 g/dL Final   [facility]  Date Value Ref Range Status  02/21/2023 31.3 26.0 - 34.0 pg Final   MCV  Date Value Ref Range Status  02/21/2023 90.5 80.0 - 100.0 fL Final  02/06/2014 95 80 - 100 fL Final   No results found for: "PLTCOUNTKUC", "LABPLAT",  "POCPLA" RDW  Date Value Ref Range Status  02/21/2023 13.4 11.5 - 15.5 % Final  02/06/2014 13.0 11.5 - 14.5 % Final

## 2023-05-25 ENCOUNTER — Other Ambulatory Visit: Payer: Self-pay | Admitting: Internal Medicine

## 2023-05-25 NOTE — Telephone Encounter (Signed)
 Requested Prescriptions  Pending Prescriptions Disp Refills   rosuvastatin (CRESTOR) 10 MG tablet [Pharmacy Med Name: ROSUVASTATIN CALCIUM 10 MG TAB] 90 tablet 1    Sig: TAKE 1 TABLET BY MOUTH EVERY DAY     Cardiovascular:  Antilipid - Statins 2 Failed - 05/25/2023  4:06 PM      Failed - Lipid Panel in normal range within the last 12 months    Cholesterol  Date Value Ref Range Status  11/22/2022 260 (H) <200 mg/dL Final   LDL Cholesterol (Calc)  Date Value Ref Range Status  11/22/2022 150 (H) mg/dL (calc) Final    Comment:    Reference range: <100 . Desirable range <100 mg/dL for primary prevention;   <70 mg/dL for patients with CHD or diabetic patients  with > or = 2 CHD risk factors. Marland Kitchen LDL-C is now calculated using the Martin-Hopkins  calculation, which is a validated novel method providing  better accuracy than the Friedewald equation in the  estimation of LDL-C.  Horald Pollen et al. Lenox Ahr. 9604;540(98): 2061-2068  (http://education.[facility].com/faq/FAQ164)    HDL  Date Value Ref Range Status  11/22/2022 49 > OR = 40 mg/dL Final   Triglycerides  Date Value Ref Range Status  11/22/2022 397 (H) <150 mg/dL Final    Comment:    . If a non-fasting specimen was collected, consider repeat triglyceride testing on a fasting specimen if clinically indicated.  Perry Mount et al. J. of Clin. Lipidol. 2015;9:129-169. Marland Kitchen          Passed - Cr in normal range and within 360 days    Creat  Date Value Ref Range Status  11/22/2022 0.97 0.70 - 1.28 mg/dL Final   Creatinine, Ser  Date Value Ref Range Status  02/21/2023 0.70 0.61 - 1.24 mg/dL Final   Creatinine, Urine  Date Value Ref Range Status  11/22/2022 111 20 - 320 mg/dL Final         Passed - Patient is not pregnant      Passed - Valid encounter within last 12 months    Recent Outpatient Visits           3 months ago Abnormal TSH   [place] [facility], Salvadore Oxford, NP   5 months ago COPD  exacerbation [facility])   [place] [facility] [place], [place] W, NP   6 months ago Type 2 diabetes mellitus with other specified complication, without long-term current use of insulin [facility])   [facility] [place], Salvadore Oxford, NP   10 months ago Vertigo   [place] [facility] [place], [place] W, NP   1 year ago Sepsis, due to unspecified organism, unspecified whether acute organ dysfunction present [facility])   [place] [facility] [place], Salvadore Oxford, NP       Future Appointments             In 4 days [place], Salvadore Oxford, NP [facility], [facility]

## 2023-05-29 ENCOUNTER — Ambulatory Visit: Payer: Self-pay | Admitting: Internal Medicine

## 2023-05-29 NOTE — Progress Notes (Deleted)
 Subjective:    Patient ID: Jonathan Willis, male    DOB: 10/07/1945, 78 y.o.   MRN: [id number]  HPI  Patient presents to clinic today for follow-up of chronic conditions.  HLD with carotid/aortic atherosclerosis: His last LDL was 150, triglycerides 397, 10/2022.  He is not taking rosuvastatin as prescribed.  He is not currently taking any aspirin.  He does not consume a low-fat diet.  COPD with CHRF: He reports chronic cough and shortness of breath.  He is not using trelegy and albuterol as prescribed.  There are no PFTs on file.  He follows with pulmonology.  HTN: His BP today is 114/58.  He is not taking any antihypertensive medications at this time.  ECG from 01/2023 reviewed.  DM2: His last A1c was 7.9%, 10/2022.  He is not taking metformin and glipizide as prescribed.  He does not check his sugars.  He does not check his feet routinely.  His last eye exam was >1-year ago.  Flu 10/2022.  Pneumovax 03/2018.  Prevnar 12/2016.  COVID x 5.  BPH: He denies any symptoms at this time.  He is not currently taking medications for this.  He does not follow with urology.  Alzheimer's: He is taking donepezil as prescribed.  He does not follow with neurology.  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 (Patient not taking: Reported on 03/10/2023) 8 g 1   aspirin EC 81 MG tablet Take 1 tablet (81 mg total) by  mouth daily. Swallow whole.     donepezil (ARICEPT) 5 MG tablet Take by mouth.     Fluticasone-Umeclidin-Vilant (TRELEGY ELLIPTA) 100-62.5-25 MCG/ACT AEPB Inhale 1 puff into the lungs daily. 1 each 5   furosemide (LASIX) 20 MG tablet Take 20 mg by mouth daily.     glipiZIDE (GLUCOTROL) 5 MG tablet Take 1 tablet (5 mg total) by mouth daily before breakfast. 30 tablet 11   ipratropium-albuterol (DUONEB) 0.5-2.5 (3) MG/3ML SOLN Take 3 mLs by nebulization every 4 (four) hours as needed. 360 mL 1   levothyroxine (SYNTHROID) 25 MCG tablet Take 1 tablet (25 mcg total) by mouth daily. 90 tablet 1   metFORMIN (GLUCOPHAGE) 500 MG tablet TAKE 1 TABLET BY MOUTH 2 TIMES DAILY WITH A MEAL. 180 tablet 0   Multiple Vitamin (MULTIVITAMIN) tablet Take 1 tablet by mouth daily.     OXYGEN Inhale into the lungs. 3 Liters     rosuvastatin (CRESTOR) 10 MG tablet TAKE 1 TABLET BY MOUTH EVERY DAY 90 tablet 1   No current facility-administered medications for this visit.    No Known Allergies  Family History  Problem Relation Age of Onset   COPD Neg Hx    Diabetes Mellitus II Neg Hx    Hypertension Neg Hx     Social History   Socioeconomic History  Marital status: Divorced    Spouse name: Not on file   Number of children: Not on file   Years of education: 9   Highest education level: 9th grade  Occupational History   Occupation: retired  Tobacco Use   Smoking status: Former    Current packs/day: 0.00    Average packs/day: 1.5 packs/day for 44.0 years (66.0 ttl pk-yrs)    Types: Cigarettes    Start date: 01/11/1963    Quit date: 01/11/2007    Years since quitting: 16.3   Smokeless tobacco: Former  Building services engineer status: Never Used  Substance and Sexual Activity   Alcohol use: Yes    Comment: very rarely   Drug use: Never   Sexual activity: Yes    Birth control/protection: None  Other Topics Concern   Not on file  Social History Narrative   Not on file   Social Drivers of Health    Financial Resource Strain: Low Risk  (03/10/2023)   Overall Financial Resource Strain (CARDIA)    Difficulty of Paying Living Expenses: Not hard at all  Food Insecurity: No Food Insecurity (03/10/2023)   Hunger Vital Sign    Worried About Running Out of Food in the Last Year: Never true    Ran Out of Food in the Last Year: Never true  Transportation Needs: No Transportation Needs (03/10/2023)   PRAPARE - Administrator, Civil Service (Medical): No    Lack of Transportation (Non-Medical): No  Physical Activity: Insufficiently Active (03/10/2023)   Exercise Vital Sign    Days of Exercise per Week: 3 days    Minutes of Exercise per Session: 30 min  Stress: No Stress Concern Present (03/10/2023)   [employer] of Occupational Health - Occupational Stress Questionnaire    Feeling of Stress : Not at all  Social Connections: Moderately Isolated (03/10/2023)   Social Connection and Isolation Panel [NHANES]    Frequency of Communication with Friends and Family: More than three times a week    Frequency of Social Gatherings with Friends and Family: More than three times a week    Attends Religious Services: 1 to 4 times per year    Active Member of [employer] or Organizations: No    Attends Banker Meetings: Never    Marital Status: Divorced  Catering manager Violence: Not At Risk (03/10/2023)   Humiliation, Afraid, Rape, and Kick questionnaire    Fear of Current or Ex-Partner: No    Emotionally Abused: No    Physically Abused: No    Sexually Abused: No     Constitutional: Denies fever, malaise, fatigue, headache or abrupt weight changes.  HEENT: Denies eye pain, eye redness, ear pain, ringing in the ears, wax buildup, runny nose, nasal congestion, bloody nose, or sore throat. Respiratory: Patient reports chronic cough and shortness of breath.  Denies difficulty breathing, or sputum production.   Cardiovascular: Denies chest pain, chest tightness, palpitations  or swelling in the hands or feet.  Gastrointestinal: Denies abdominal pain, bloating, constipation, diarrhea or blood in the stool.  GU: Denies urgency, frequency, pain with urination, burning sensation, blood in urine, odor or discharge. Musculoskeletal: Denies decrease in range of motion, difficulty with gait, muscle pain or joint pain or swelling.  Skin: Denies redness, rashes, lesions or ulcercations.  Neurological: Patient reports difficulty with memory.  Denies dizziness, difficulty with speech or problems with balance and coordination.  Psych: Denies anxiety, depression, SI/HI.  No other specific complaints in a  complete review of systems (except as listed in HPI above).  Objective:   Physical Exam  There were no vitals taken for this visit.  Wt Readings from Last 3 Encounters:  03/10/23 107 lb (48.5 kg)  02/19/23 110 lb (49.9 kg)  02/09/23 108 lb 6.4 oz (49.2 kg)    General: Appears his stated age, well developed, well nourished in NAD. Skin: Warm, dry and intact. No ulcerations noted. HEENT: Head: normal shape and size; Eyes: sclera white, no icterus, conjunctiva pink, PERRLA and EOMs intact;  Cardiovascular: Normal rate and rhythm. S1,S2 noted.  No murmur, rubs or gallops noted. No JVD or BLE edema. No carotid bruits noted. Pulmonary/Chest: Normal effort and positive vesicular breath sounds with bilateral inspiratory wheezing. No respiratory distress. No rales or ronchi noted.  Abdomen: Soft and nontender. Normal bowel sounds. No distention or masses noted. Liver, spleen and kidneys non palpable. Musculoskeletal: No difficulty with gait.  Neurological: Alert and oriented. Coordination normal.  Psychiatric: Mood and affect normal. Behavior is normal. Judgment and thought content normal.     BMET    Component Value Date/Time   NA 140 02/21/2023 0415   NA 136 02/04/2014 0616   K 3.3 (L) 02/21/2023 0415   K 4.1 02/04/2014 0616   CL 108 02/21/2023 0415   CL 102  02/04/2014 0616   CO2 24 02/21/2023 0415   CO2 25 02/04/2014 0616   GLUCOSE 135 (H) 02/21/2023 0415   GLUCOSE 225 (H) 02/04/2014 0616   BUN 16 02/21/2023 0415   BUN 18 02/04/2014 0616   CREATININE 0.70 02/21/2023 0415   CREATININE 0.97 11/22/2022 1031   CALCIUM 8.3 (L) 02/21/2023 0415   CALCIUM 9.1 02/04/2014 0616   GFRNONAA >60 02/21/2023 0415   GFRNONAA 73 10/16/2018 0928   GFRAA >60 06/06/2019 1049   GFRAA 85 10/16/2018 0928    Lipid Panel     Component Value Date/Time   CHOL 260 (H) 11/22/2022 1031   TRIG 397 (H) 11/22/2022 1031   HDL 49 11/22/2022 1031   CHOLHDL 5.3 (H) 11/22/2022 1031   LDLCALC 150 (H) 11/22/2022 1031    CBC    Component Value Date/Time   WBC 10.4 02/21/2023 0415   RBC 3.48 (L) 02/21/2023 0415   HGB 10.9 (L) 02/21/2023 0415   HGB 14.1 02/06/2014 0345   HCT 31.5 (L) 02/21/2023 0415   HCT 42.3 02/06/2014 0345   PLT 195 02/21/2023 0415   PLT 336 02/06/2014 0345   MCV 90.5 02/21/2023 0415   MCV 95 02/06/2014 0345   MCH 31.3 02/21/2023 0415   MCHC 34.6 02/21/2023 0415   RDW 13.4 02/21/2023 0415   RDW 13.0 02/06/2014 0345   LYMPHSABS 2.9 08/01/2022 2247   LYMPHSABS 1.7 02/06/2014 0345   MONOABS 0.5 08/01/2022 2247   MONOABS 0.5 02/06/2014 0345   EOSABS 0.1 08/01/2022 2247   EOSABS 0.0 02/06/2014 0345   BASOSABS 0.0 08/01/2022 2247   BASOSABS 0.0 02/06/2014 0345    Hgb A1C Lab Results  Component Value Date   HGBA1C 7.9 (A) 11/22/2022           Assessment & Plan:     RTC in 3 months, follow-up chronic conditions Nicki Reaper, NP

## 2024-02-16 ENCOUNTER — Telehealth: Payer: Self-pay

## 2024-02-16 NOTE — Telephone Encounter (Signed)
 Attempted to get patient scheduled to see Angeline prior to the end of the year. When I called the phone number in the chart male voice no he not here no more.
